# Patient Record
Sex: Male | Born: 1945 | Race: White | Hispanic: No | Marital: Married | Smoking: Never smoker
Health system: Southern US, Community
[De-identification: ages and names within clinical notes are randomized; demographics above are authoritative.]

## PROBLEM LIST (undated history)

## (undated) DIAGNOSIS — K429 Umbilical hernia without obstruction or gangrene: Secondary | ICD-10-CM

## (undated) DIAGNOSIS — Z951 Presence of aortocoronary bypass graft: Secondary | ICD-10-CM

## (undated) DIAGNOSIS — K08109 Complete loss of teeth, unspecified cause, unspecified class: Secondary | ICD-10-CM

## (undated) DIAGNOSIS — E669 Obesity, unspecified: Secondary | ICD-10-CM

## (undated) DIAGNOSIS — G4730 Sleep apnea, unspecified: Secondary | ICD-10-CM

## (undated) DIAGNOSIS — H9190 Unspecified hearing loss, unspecified ear: Secondary | ICD-10-CM

## (undated) DIAGNOSIS — Q210 Ventricular septal defect: Secondary | ICD-10-CM

## (undated) DIAGNOSIS — J449 Chronic obstructive pulmonary disease, unspecified: Secondary | ICD-10-CM

## (undated) DIAGNOSIS — I059 Rheumatic mitral valve disease, unspecified: Secondary | ICD-10-CM

## (undated) DIAGNOSIS — K222 Esophageal obstruction: Secondary | ICD-10-CM

## (undated) DIAGNOSIS — K219 Gastro-esophageal reflux disease without esophagitis: Secondary | ICD-10-CM

## (undated) DIAGNOSIS — I10 Essential (primary) hypertension: Secondary | ICD-10-CM

## (undated) DIAGNOSIS — I340 Nonrheumatic mitral (valve) insufficiency: Secondary | ICD-10-CM

## (undated) DIAGNOSIS — R2681 Unsteadiness on feet: Secondary | ICD-10-CM

## (undated) DIAGNOSIS — R4189 Other symptoms and signs involving cognitive functions and awareness: Secondary | ICD-10-CM

## (undated) DIAGNOSIS — Z8774 Personal history of (corrected) congenital malformations of heart and circulatory system: Secondary | ICD-10-CM

## (undated) DIAGNOSIS — I350 Nonrheumatic aortic (valve) stenosis: Secondary | ICD-10-CM

## (undated) DIAGNOSIS — E119 Type 2 diabetes mellitus without complications: Secondary | ICD-10-CM

## (undated) DIAGNOSIS — Q288 Other specified congenital malformations of circulatory system: Secondary | ICD-10-CM

## (undated) DIAGNOSIS — I471 Supraventricular tachycardia, unspecified: Secondary | ICD-10-CM

## (undated) DIAGNOSIS — I712 Thoracic aortic aneurysm, without rupture, unspecified: Secondary | ICD-10-CM

## (undated) DIAGNOSIS — I272 Pulmonary hypertension, unspecified: Secondary | ICD-10-CM

## (undated) DIAGNOSIS — I422 Other hypertrophic cardiomyopathy: Secondary | ICD-10-CM

## (undated) DIAGNOSIS — I351 Nonrheumatic aortic (valve) insufficiency: Secondary | ICD-10-CM

## (undated) DIAGNOSIS — R413 Other amnesia: Secondary | ICD-10-CM

## (undated) DIAGNOSIS — I2510 Atherosclerotic heart disease of native coronary artery without angina pectoris: Secondary | ICD-10-CM

## (undated) DIAGNOSIS — I509 Heart failure, unspecified: Secondary | ICD-10-CM

## (undated) DIAGNOSIS — K635 Polyp of colon: Secondary | ICD-10-CM

## (undated) DIAGNOSIS — N200 Calculus of kidney: Secondary | ICD-10-CM

## (undated) DIAGNOSIS — E785 Hyperlipidemia, unspecified: Secondary | ICD-10-CM

## (undated) DIAGNOSIS — Z8679 Personal history of other diseases of the circulatory system: Secondary | ICD-10-CM

## (undated) DIAGNOSIS — I4719 Other supraventricular tachycardia: Secondary | ICD-10-CM

## (undated) DIAGNOSIS — I480 Paroxysmal atrial fibrillation: Secondary | ICD-10-CM

## (undated) DIAGNOSIS — I4891 Unspecified atrial fibrillation: Secondary | ICD-10-CM

## (undated) DIAGNOSIS — R4689 Other symptoms and signs involving appearance and behavior: Secondary | ICD-10-CM

## (undated) HISTORY — DX: Supraventricular tachycardia: I47.1

## (undated) HISTORY — DX: Other supraventricular tachycardia: I47.19

## (undated) HISTORY — DX: Calculus of kidney: N20.0

## (undated) HISTORY — DX: Chronic obstructive pulmonary disease, unspecified: J44.9

## (undated) HISTORY — DX: Supraventricular tachycardia, unspecified: I47.10

## (undated) HISTORY — DX: Thoracic aortic aneurysm, without rupture, unspecified: I71.20

## (undated) HISTORY — DX: Ventricular septal defect: Q21.0

## (undated) HISTORY — PX: COLONOSCOPY W/ POLYPECTOMY: SHX1380

## (undated) HISTORY — PX: CARDIAC CATHETERIZATION: SHX172

## (undated) HISTORY — PX: OTHER SURGICAL HISTORY: SHX169

## (undated) HISTORY — DX: Thoracic aortic aneurysm, without rupture: I71.2

## (undated) HISTORY — PX: VSD REPAIR: SHX276

---

## 1989-02-03 HISTORY — PX: EYE SURGERY: SHX253

## 2000-10-28 ENCOUNTER — Emergency Department (HOSPITAL_COMMUNITY): Admission: EM | Admit: 2000-10-28 | Discharge: 2000-10-28 | Payer: Self-pay | Admitting: Emergency Medicine

## 2002-06-05 HISTORY — PX: NASAL SEPTUM SURGERY: SHX37

## 2003-04-10 ENCOUNTER — Other Ambulatory Visit: Payer: Self-pay

## 2003-06-02 ENCOUNTER — Other Ambulatory Visit: Payer: Self-pay

## 2004-12-21 ENCOUNTER — Other Ambulatory Visit: Payer: Self-pay

## 2004-12-21 ENCOUNTER — Emergency Department: Payer: Self-pay | Admitting: Emergency Medicine

## 2005-09-27 ENCOUNTER — Encounter: Payer: Self-pay | Admitting: Podiatry

## 2005-11-10 ENCOUNTER — Ambulatory Visit: Payer: Self-pay | Admitting: Unknown Physician Specialty

## 2009-03-05 ENCOUNTER — Ambulatory Visit: Payer: Self-pay | Admitting: Unknown Physician Specialty

## 2011-04-07 ENCOUNTER — Ambulatory Visit: Payer: Self-pay | Admitting: Internal Medicine

## 2011-05-10 ENCOUNTER — Other Ambulatory Visit: Payer: Self-pay | Admitting: Internal Medicine

## 2011-05-11 LAB — PSA: PSA: 0.7 ng/mL (ref 0.0–4.0)

## 2011-10-28 ENCOUNTER — Inpatient Hospital Stay: Payer: Self-pay | Admitting: Internal Medicine

## 2011-10-28 LAB — CBC
HCT: 43.3 % (ref 40.0–52.0)
HGB: 14.7 g/dL (ref 13.0–18.0)
MCH: 31.7 pg (ref 26.0–34.0)
MCHC: 33.9 g/dL (ref 32.0–36.0)
MCV: 93 fL (ref 80–100)
Platelet: 211 10*3/uL (ref 150–440)
RBC: 4.64 10*6/uL (ref 4.40–5.90)
RDW: 12.7 % (ref 11.5–14.5)
WBC: 10.9 10*3/uL — ABNORMAL HIGH (ref 3.8–10.6)

## 2011-10-28 LAB — COMPREHENSIVE METABOLIC PANEL
BUN: 18 mg/dL (ref 7–18)
Bilirubin,Total: 0.4 mg/dL (ref 0.2–1.0)
Chloride: 102 mmol/L (ref 98–107)
Co2: 26 mmol/L (ref 21–32)
EGFR (Non-African Amer.): >60
Glucose: 247 mg/dL — ABNORMAL HIGH (ref 65–99)
Osmolality: 284 (ref 275–301)
Potassium: 4.5 mmol/L (ref 3.5–5.1)
Sodium: 137 mmol/L (ref 136–145)
Total Protein: 7.1 g/dL (ref 6.4–8.2)

## 2011-10-28 LAB — CK TOTAL AND CKMB (NOT AT ARMC): CK, Total: 74 U/L (ref 35–232)

## 2011-10-28 LAB — TROPONIN I: Troponin-I: 0.09 ng/mL — ABNORMAL HIGH

## 2011-10-29 LAB — LIPID PANEL
Cholesterol: 192 mg/dL (ref 0–200)
HDL Cholesterol: 32 mg/dL — ABNORMAL LOW (ref 40–60)
Ldl Cholesterol, Calc: 130 mg/dL — ABNORMAL HIGH (ref 0–100)
Triglycerides: 150 mg/dL (ref 0–200)
VLDL Cholesterol, Calc: 30 mg/dL (ref 5–40)

## 2011-10-29 LAB — DRUG SCREEN, URINE
Amphetamines, Ur Screen: NEGATIVE (ref ?–1000)
Barbiturates, Ur Screen: NEGATIVE (ref ?–200)
Benzodiazepine, Ur Scrn: NEGATIVE (ref ?–200)
Cannabinoid 50 Ng, Ur ~~LOC~~: NEGATIVE (ref ?–50)
Methadone, Ur Screen: NEGATIVE (ref ?–300)
Opiate, Ur Screen: NEGATIVE (ref ?–300)
Phencyclidine (PCP) Ur S: NEGATIVE (ref ?–25)

## 2011-10-29 LAB — URINALYSIS, COMPLETE
Bacteria: NONE SEEN
Bilirubin,UR: NEGATIVE
Blood: NEGATIVE
Hyaline Cast: 10
Leukocyte Esterase: NEGATIVE
Nitrite: NEGATIVE
Ph: 5 (ref 4.5–8.0)
Protein: NEGATIVE
RBC,UR: 1 /HPF (ref 0–5)
Specific Gravity: 1.023 (ref 1.003–1.030)
Squamous Epithelial: 1
WBC UR: 1 /HPF (ref 0–5)

## 2011-10-29 LAB — CK TOTAL AND CKMB (NOT AT ARMC)
CK, Total: 60 U/L (ref 35–232)
CK-MB: 2.4 ng/mL (ref 0.5–3.6)
CK-MB: 1.9 ng/mL (ref 0.5–3.6)

## 2011-10-29 LAB — BASIC METABOLIC PANEL
BUN: 15 mg/dL (ref 7–18)
Chloride: 105 mmol/L (ref 98–107)
Co2: 27 mmol/L (ref 21–32)
EGFR (Non-African Amer.): >60
Glucose: 203 mg/dL — ABNORMAL HIGH (ref 65–99)
Osmolality: 288 (ref 275–301)

## 2011-10-29 LAB — CBC WITH DIFFERENTIAL/PLATELET
Basophil %: 0.5 %
Eosinophil #: 0.1 10*3/uL (ref 0.0–0.7)
HCT: 40.1 % (ref 40.0–52.0)
Lymphocyte #: 2.6 10*3/uL (ref 1.0–3.6)
Lymphocyte %: 30.6 %
MCH: 31.7 pg (ref 26.0–34.0)
MCHC: 34 g/dL (ref 32.0–36.0)
MCV: 93 fL (ref 80–100)
Monocyte #: 0.7 x10 3/mm (ref 0.2–1.0)
Monocyte %: 8.6 %
Neutrophil #: 5 10*3/uL (ref 1.4–6.5)
RBC: 4.31 10*6/uL — ABNORMAL LOW (ref 4.40–5.90)
WBC: 8.5 10*3/uL (ref 3.8–10.6)

## 2011-10-29 LAB — MAGNESIUM: Magnesium: 1.6 mg/dL — ABNORMAL LOW

## 2011-10-29 LAB — PROTIME-INR: Prothrombin Time: 13.1 secs (ref 11.5–14.7)

## 2011-10-29 LAB — TROPONIN I: Troponin-I: 0.22 ng/mL — ABNORMAL HIGH

## 2011-10-29 LAB — TSH: Thyroid Stimulating Horm: 1.56 u[IU]/mL

## 2011-10-30 LAB — BASIC METABOLIC PANEL
Anion Gap: 8 (ref 7–16)
BUN: 13 mg/dL (ref 7–18)
Calcium, Total: 8.1 mg/dL — ABNORMAL LOW (ref 8.5–10.1)
Chloride: 108 mmol/L — ABNORMAL HIGH (ref 98–107)
Co2: 26 mmol/L (ref 21–32)
Creatinine: 0.75 mg/dL (ref 0.60–1.30)
EGFR (African American): >60
Osmolality: 285 (ref 275–301)
Potassium: 4 mmol/L (ref 3.5–5.1)

## 2011-10-30 LAB — CBC WITH DIFFERENTIAL/PLATELET
Basophil #: 0 10*3/uL (ref 0.0–0.1)
HGB: 14.2 g/dL (ref 13.0–18.0)
Lymphocyte #: 2.2 10*3/uL (ref 1.0–3.6)
Lymphocyte %: 26.7 %
MCHC: 33.7 g/dL (ref 32.0–36.0)
MCV: 94 fL (ref 80–100)
Neutrophil #: 5.2 10*3/uL (ref 1.4–6.5)
Neutrophil %: 63 %
Platelet: 181 10*3/uL (ref 150–440)
WBC: 8.2 10*3/uL (ref 3.8–10.6)

## 2012-11-03 HISTORY — PX: CARDIAC CATHETERIZATION: SHX172

## 2012-11-07 ENCOUNTER — Ambulatory Visit: Payer: Self-pay | Admitting: Internal Medicine

## 2012-11-17 ENCOUNTER — Emergency Department: Payer: Self-pay | Admitting: Internal Medicine

## 2012-11-17 ENCOUNTER — Inpatient Hospital Stay (HOSPITAL_COMMUNITY)
Admission: AD | Admit: 2012-11-17 | Discharge: 2012-11-25 | DRG: 287 | Disposition: A | Discharge status: Home / Self Care | Payer: Managed Care, Other (non HMO) | Source: Other Acute Inpatient Hospital | Attending: Cardiology | Admitting: Cardiology

## 2012-11-17 DIAGNOSIS — I272 Pulmonary hypertension, unspecified: Secondary | ICD-10-CM | POA: Diagnosis present

## 2012-11-17 DIAGNOSIS — E119 Type 2 diabetes mellitus without complications: Secondary | ICD-10-CM | POA: Diagnosis present

## 2012-11-17 DIAGNOSIS — R600 Localized edema: Secondary | ICD-10-CM | POA: Diagnosis present

## 2012-11-17 DIAGNOSIS — I2510 Atherosclerotic heart disease of native coronary artery without angina pectoris: Secondary | ICD-10-CM | POA: Diagnosis present

## 2012-11-17 DIAGNOSIS — I2789 Other specified pulmonary heart diseases: Secondary | ICD-10-CM

## 2012-11-17 DIAGNOSIS — K029 Dental caries, unspecified: Secondary | ICD-10-CM | POA: Diagnosis present

## 2012-11-17 DIAGNOSIS — Z7901 Long term (current) use of anticoagulants: Secondary | ICD-10-CM

## 2012-11-17 DIAGNOSIS — G4730 Sleep apnea, unspecified: Secondary | ICD-10-CM | POA: Diagnosis present

## 2012-11-17 DIAGNOSIS — Q210 Ventricular septal defect: Secondary | ICD-10-CM

## 2012-11-17 DIAGNOSIS — K0530 Chronic periodontitis, unspecified: Secondary | ICD-10-CM | POA: Diagnosis present

## 2012-11-17 DIAGNOSIS — E876 Hypokalemia: Secondary | ICD-10-CM | POA: Diagnosis not present

## 2012-11-17 DIAGNOSIS — E7800 Pure hypercholesterolemia, unspecified: Secondary | ICD-10-CM | POA: Diagnosis present

## 2012-11-17 DIAGNOSIS — R6889 Other general symptoms and signs: Secondary | ICD-10-CM | POA: Diagnosis present

## 2012-11-17 DIAGNOSIS — R011 Cardiac murmur, unspecified: Secondary | ICD-10-CM

## 2012-11-17 DIAGNOSIS — I10 Essential (primary) hypertension: Secondary | ICD-10-CM

## 2012-11-17 DIAGNOSIS — K083 Retained dental root: Secondary | ICD-10-CM | POA: Diagnosis present

## 2012-11-17 DIAGNOSIS — K219 Gastro-esophageal reflux disease without esophagitis: Secondary | ICD-10-CM | POA: Diagnosis present

## 2012-11-17 DIAGNOSIS — I5031 Acute diastolic (congestive) heart failure: Secondary | ICD-10-CM | POA: Diagnosis present

## 2012-11-17 DIAGNOSIS — E785 Hyperlipidemia, unspecified: Secondary | ICD-10-CM | POA: Diagnosis present

## 2012-11-17 DIAGNOSIS — I4891 Unspecified atrial fibrillation: Principal | ICD-10-CM | POA: Diagnosis present

## 2012-11-17 DIAGNOSIS — R609 Edema, unspecified: Secondary | ICD-10-CM

## 2012-11-17 DIAGNOSIS — I428 Other cardiomyopathies: Secondary | ICD-10-CM | POA: Diagnosis present

## 2012-11-17 HISTORY — DX: Gastro-esophageal reflux disease without esophagitis: K21.9

## 2012-11-17 HISTORY — DX: Hyperlipidemia, unspecified: E78.5

## 2012-11-17 HISTORY — DX: Sleep apnea, unspecified: G47.30

## 2012-11-17 HISTORY — DX: Essential (primary) hypertension: I10

## 2012-11-17 LAB — MAGNESIUM: Magnesium: 1.6 mg/dL — ABNORMAL LOW

## 2012-11-17 LAB — TROPONIN I
Troponin I: <0.3 ng/mL (ref <0.30)
Troponin-I: 0.06 ng/mL — ABNORMAL HIGH

## 2012-11-17 LAB — CBC WITH DIFFERENTIAL/PLATELET
Basophils Absolute: 0 10*3/uL (ref 0.0–0.1)
Eosinophils Relative: 2 % (ref 0–5)
Lymphocytes Relative: 30 % (ref 12–46)
Neutro Abs: 4.4 10*3/uL (ref 1.7–7.7)
Neutrophils Relative %: 59 % (ref 43–77)
Platelets: 199 10*3/uL (ref 150–400)
RBC: 4.34 MIL/uL (ref 4.22–5.81)
RDW: 12.3 % (ref 11.5–15.5)
WBC: 7.5 10*3/uL (ref 4.0–10.5)

## 2012-11-17 LAB — CBC
HCT: 43.3 % (ref 40.0–52.0)
MCH: 31.8 pg (ref 26.0–34.0)
MCHC: 33.7 g/dL (ref 32.0–36.0)
Platelet: 218 10*3/uL (ref 150–440)
RBC: 4.58 10*6/uL (ref 4.40–5.90)
WBC: 10.6 10*3/uL (ref 3.8–10.6)

## 2012-11-17 LAB — COMPREHENSIVE METABOLIC PANEL
Albumin: 3.3 g/dL — ABNORMAL LOW (ref 3.4–5.0)
Bilirubin,Total: 0.6 mg/dL (ref 0.2–1.0)
Calcium, Total: 8.7 mg/dL (ref 8.5–10.1)
Chloride: 107 mmol/L (ref 98–107)
Co2: 23 mmol/L (ref 21–32)
Creatinine: 1.19 mg/dL (ref 0.60–1.30)
Glucose: 330 mg/dL — ABNORMAL HIGH (ref 65–99)
Potassium: 4.4 mmol/L (ref 3.5–5.1)
SGOT(AST): 55 U/L — ABNORMAL HIGH (ref 15–37)
Sodium: 137 mmol/L (ref 136–145)
Total Protein: 6.5 g/dL (ref 6.4–8.2)

## 2012-11-17 LAB — PRO B NATRIURETIC PEPTIDE: B-Type Natriuretic Peptide: 3366 pg/mL — ABNORMAL HIGH (ref 0–125)

## 2012-11-17 LAB — PROTIME-INR
INR: 1.07 (ref 0.00–1.49)
Prothrombin Time: 13.8 seconds (ref 11.6–15.2)

## 2012-11-17 MED ORDER — SODIUM CHLORIDE 0.9 % IJ SOLN
3.0000 mL | INTRAMUSCULAR | Status: DC | PRN
  Administered 2012-11-21: 3 mL via INTRAVENOUS

## 2012-11-17 MED ORDER — PATIENT'S GUIDE TO USING COUMADIN BOOK
Freq: Once | Status: DC
  Filled 2012-11-17: qty 1

## 2012-11-17 MED ORDER — WARFARIN - PHARMACIST DOSING INPATIENT: Freq: Every day | Status: DC

## 2012-11-17 MED ORDER — SODIUM CHLORIDE 0.9 % IV SOLN
250.0000 mL | INTRAVENOUS | Status: DC | PRN
  Administered 2012-11-18: 250 mL via INTRAVENOUS

## 2012-11-17 MED ORDER — ASPIRIN EC 81 MG PO TBEC
81.0000 mg | DELAYED_RELEASE_TABLET | Freq: Every day | ORAL | Status: DC
  Administered 2012-11-18 (×2): 81 mg via ORAL
  Filled 2012-11-17 (×2): qty 1

## 2012-11-17 MED ORDER — MORPHINE SULFATE 2 MG/ML IJ SOLN: 2.0000 mg | INTRAMUSCULAR | Status: DC | PRN

## 2012-11-17 MED ORDER — ONDANSETRON HCL 4 MG/2ML IJ SOLN: 4.0000 mg | Freq: Four times a day (QID) | INTRAMUSCULAR | Status: DC | PRN

## 2012-11-17 MED ORDER — WARFARIN SODIUM 7.5 MG PO TABS
7.5000 mg | ORAL_TABLET | ORAL | Status: AC
  Administered 2012-11-18: 7.5 mg via ORAL
  Filled 2012-11-17: qty 1

## 2012-11-17 MED ORDER — ONDANSETRON HCL 4 MG PO TABS: 4.0000 mg | ORAL_TABLET | Freq: Four times a day (QID) | ORAL | Status: DC | PRN

## 2012-11-17 MED ORDER — SODIUM CHLORIDE 0.9 % IJ SOLN
3.0000 mL | Freq: Two times a day (BID) | INTRAMUSCULAR | Status: DC
  Administered 2012-11-17 – 2012-11-25 (×13): 3 mL via INTRAVENOUS

## 2012-11-17 MED ORDER — ENOXAPARIN SODIUM 40 MG/0.4ML ~~LOC~~ SOLN
40.0000 mg | SUBCUTANEOUS | Status: DC
  Administered 2012-11-18: 40 mg via SUBCUTANEOUS
  Filled 2012-11-17 (×2): qty 0.4

## 2012-11-17 MED ORDER — DILTIAZEM HCL 100 MG IV SOLR
5.0000 mg/h | INTRAVENOUS | Status: DC
  Administered 2012-11-18 (×2): 5 mg/h via INTRAVENOUS
  Filled 2012-11-17 (×3): qty 100

## 2012-11-17 MED ORDER — INSULIN ASPART 100 UNIT/ML ~~LOC~~ SOLN
0.0000 [IU] | Freq: Three times a day (TID) | SUBCUTANEOUS | Status: DC
  Administered 2012-11-18: 2 [IU] via SUBCUTANEOUS
  Administered 2012-11-18: 3 [IU] via SUBCUTANEOUS
  Administered 2012-11-18 – 2012-11-19 (×2): 2 [IU] via SUBCUTANEOUS
  Administered 2012-11-19: 3 [IU] via SUBCUTANEOUS
  Administered 2012-11-19: 5 [IU] via SUBCUTANEOUS
  Administered 2012-11-20: 2 [IU] via SUBCUTANEOUS
  Administered 2012-11-20: 3 [IU] via SUBCUTANEOUS
  Administered 2012-11-22: 1 [IU] via SUBCUTANEOUS
  Administered 2012-11-22: 2 [IU] via SUBCUTANEOUS
  Administered 2012-11-22 – 2012-11-23 (×2): 1 [IU] via SUBCUTANEOUS
  Administered 2012-11-23 – 2012-11-24 (×3): 2 [IU] via SUBCUTANEOUS
  Administered 2012-11-24: 3 [IU] via SUBCUTANEOUS
  Administered 2012-11-25 (×2): 1 [IU] via SUBCUTANEOUS

## 2012-11-17 MED ORDER — WARFARIN VIDEO
Freq: Once | Status: AC
  Administered 2012-11-18: 1

## 2012-11-17 NOTE — Progress Notes (Addendum)
Pt arrived via [facility], pt oriented. Pt able to ambulate to bathroom on arrival, had to void immediately and could not wait for urinal. Assist to bed. Afib with HR 98-114, cardizem drip at 5mg /hr.  Paged Dr Mahala Menghini with hospitalist team, to be receiving pt as admitting MD.

## 2012-11-17 NOTE — Progress Notes (Addendum)
ANTICOAGULATION CONSULT NOTE - Initial Consult  Pharmacy Consult for coumadin Indication: atrial fibrillation  Allergies  Allergen Reactions  . Biaxin (Clarithromycin) Nausea Only    Patient Measurements: Weight: 228 lb 2.8 oz (103.5 kg)  Vital Signs: Temp: 99 F (37.2 C) (06/15 2016) Temp src: Oral (06/15 2016) BP: 142/89 mmHg (06/15 2016) Pulse Rate: 94 (06/15 2016)  Labs: No results found for this basename: HGB, HCT, PLT, APTT, LABPROT, INR, HEPARINUNFRC, CREATININE, CKTOTAL, CKMB, TROPONINI,  in the last 72 hours  CrCl is unknown because no creatinine reading has been taken and the patient has no height on file.   Medical History: No past medical history on file.  Medications:  No prescriptions prior to admission   Scheduled:  . aspirin EC  81 mg Oral Daily  . enoxaparin (LOVENOX) injection  40 mg Subcutaneous Q24H  . [START ON 11/18/2012] insulin aspart  0-9 Units Subcutaneous TID WC  . sodium chloride  3 mL Intravenous Q12H    Assessment: 67 yo male here with afib to start coumadin. No baseline INR available. Patient noted on lovenox 40mg  [place] q24hr.  Goal of Therapy:  INR 2-3 Monitor platelets by anticoagulation protocol: Yes   Plan:  -Baseline PT/INR now and daily -Begin education process -May be able to consider an alternative agent (apixiban, xarelto, etc)?  Harland German, Pharm D 11/17/2012 9:58 PM   Addendum: Baseline INR 1.07  Plan: 1) Coumadin 7.5 mg po today  Christoper Fabian, PharmD, BCPS Clinical pharmacist, pager [phone] 11/17/2012  11:32 PM

## 2012-11-17 NOTE — H&P (Signed)
History and Physical  Aaron Jones [id number]:[id number] DOB: January 13, 1946 DOA: 11/17/2012  Referring physician: ER at [place] [facility] PCP: Elmo Putt, MD   Chief Complaint: Chest pain and afib with RVR  HPI: Patient is a 67 year old man with past medical history most significant for diabetes, hypertension, hypercholesterolemia who has been having chest pain and shortness of breath for last 1 week. Patient describes chest pain as feeling of tightness present in the middle of the chest described as 6/10 it is worse. Patient's chest pain was exacerbated by minimal exertion and was associated with palpitations. There are no relieving factors. No radiation noted. Patient wants to get himself checked out and went to his primary care physician where it was noted that patient had atrial fibrillation with his heart rate running into 170s. A chest x-ray was also done which confirmed that patient had pulmonary edema(unconfirmed). Patient was started on Cardizem drip and was sent to [facility] for further management.  Patient denied any chest pain to me at this time. He was lying in his bed comfortably and was able to answer all the questions appropriately. Patient's wife was also at bedside who was assisting in answering many questions.  Patient can walk many blocks without getting short of breath. He takes all his medications and tells me that he has a heart murmur but does not know much more than that.  Patient also has a cardiologist in [place].   Patient also complains of bilateral lower extremity swelling. He denied any orthopnea, PND, previous history of chest pain, shortness of breath, heart attack, change in urinary habits, change in dietary habits, recent weight loss, fever, chills, cough.  15 point review of system was negative except what is noted above in the history of present illness.    Past medical history: Diabetes Hypertension Hypercholesterolemia  No past surgical history on  file.  Social History: Patient does not smoke and does not drink any alcohol and denies any drug use  Allergies  Allergen Reactions  . Biaxin (Clarithromycin) Nausea Only    Family history Noncontributory at this time  Prior to Admission medications   Not on File  Aspirin Cardizem  Rest of the medications needs to be confirmed from the pharmacy   Physical Exam: Filed Vitals:   11/17/12 1854 11/17/12 1900 11/17/12 2016  BP: 153/85 127/94 142/89  Pulse:   94  Temp:   99 F (37.2 C)  TempSrc:   Oral  Resp: 23 21 21   Weight: 228 lb 2.8 oz (103.5 kg)    SpO2: 97% 97% 97%   Physical Exam: General: Vital signs reviewed and noted. Well-developed, well-nourished, in no acute distress; alert, appropriate and cooperative throughout examination.  Head: Normocephalic, atraumatic.  Eyes: PERRL, EOMI, No signs of anemia or jaundince.  Nose: Mucous membranes moist, not inflammed, nonerythematous.  Throat: Oropharynx nonerythematous, no exudate appreciated.   Neck: No deformities, masses, or tenderness noted.Supple, No carotid Bruits, no JVD.  Lungs:  Normal respiratory effort. Patient had bibasilar crackles up to mid lung   Heart:  irregularly irregular rhythm. S1 and S2 normal without gallop, murmur, or rubs.  Abdomen:  BS normoactive. Soft, Nondistended, non-tender.  No masses or organomegaly.  Extremities:  trace pedal edema bilaterally up to mid shin   Neurologic: A&O X3, CN II - XII are grossly intact. Motor strength is 5/5 in the all 4 extremities, Sensations intact to light touch, Cerebellar signs negative.  Skin: No visible rashes, scars.     Wt  Readings from Last 3 Encounters:  11/17/12 228 lb 2.8 oz (103.5 kg)    Labs on Admission:  No labs have been cathetered over from ER at [facility]   CBG:  Recent Labs Lab 11/17/12 2022  GLUCAP 227*     Radiological Exams on Admission: No results found.  EKG: No EKG on chart at the time of history taking and  review   Principal Problem:   Atrial fibrillation with rapid ventricular response Active Problems:   Type II or unspecified type diabetes mellitus without mention of complication, not stated as uncontrolled   Essential hypertension, benign   Other and unspecified hyperlipidemia   Heart murmur   Assessment/Plan Patient is a 67 year old with past medical history as noted above was admitted with one week of progressive chest pain and shortness of breath and found to have new onset atrial fibrillation with rapid ventricular response.  A. fib with RVR: Cause unknown. CHADVASC score of 3+ which means that patient should be anticoagulated with Coumadin.  -Start Coumadin per pharmacy -Diltiazem drip for rate control -Change drip to oral diltiazem tomorrow -2-D echocardiogram to look for structural abnormalities or valvular heart disease -Start aspirin -TSH to rule out hyperthyroidism -Consider consulting cardiology in the morning for possible cardioversion with TEE versus 3 weeks of anticoagulation and then cardioversion -Patient should be transferred to telemetry unit in the morning if he remains chest pain-free -Cardiac enzymes x3 to rule out acute ischemia as a cause of atrial fibrillation -12-lead EKG now and in the morning -2 view chest x-ray -Check basic labs  Type 2 diabetes Sliding scale insulin Check HbA1c   Code Status: Full code Family Communication: Wife present at bedside and updated Disposition Plan/Anticipated LOS: 1-2 days  Time spent: 75 minutes  Lars Mage, MD  [facility] Team 5  If 7PM-7AM, please contact night-coverage at [email or web address], password [email or web address] 11/17/2012, 10:05 PM

## 2012-11-18 ENCOUNTER — Encounter (HOSPITAL_COMMUNITY): Payer: Self-pay | Admitting: *Deleted

## 2012-11-18 ENCOUNTER — Inpatient Hospital Stay (HOSPITAL_COMMUNITY): Payer: Managed Care, Other (non HMO)

## 2012-11-18 DIAGNOSIS — Q210 Ventricular septal defect: Secondary | ICD-10-CM

## 2012-11-18 DIAGNOSIS — I272 Pulmonary hypertension, unspecified: Secondary | ICD-10-CM | POA: Diagnosis present

## 2012-11-18 DIAGNOSIS — E876 Hypokalemia: Secondary | ICD-10-CM | POA: Diagnosis not present

## 2012-11-18 DIAGNOSIS — I359 Nonrheumatic aortic valve disorder, unspecified: Secondary | ICD-10-CM

## 2012-11-18 DIAGNOSIS — R600 Localized edema: Secondary | ICD-10-CM | POA: Diagnosis present

## 2012-11-18 DIAGNOSIS — R6889 Other general symptoms and signs: Secondary | ICD-10-CM | POA: Diagnosis present

## 2012-11-18 DIAGNOSIS — R011 Cardiac murmur, unspecified: Secondary | ICD-10-CM

## 2012-11-18 DIAGNOSIS — I4891 Unspecified atrial fibrillation: Principal | ICD-10-CM

## 2012-11-18 LAB — COMPREHENSIVE METABOLIC PANEL
Alkaline Phosphatase: 64 U/L (ref 39–117)
BUN: 19 mg/dL (ref 6–23)
Chloride: 102 mEq/L (ref 96–112)
Creatinine, Ser: 0.96 mg/dL (ref 0.50–1.35)
GFR calc Af Amer: >90 mL/min (ref >90)
GFR calc non Af Amer: 85 mL/min — ABNORMAL LOW (ref >90)
Glucose, Bld: 194 mg/dL — ABNORMAL HIGH (ref 70–99)
Potassium: 3.5 mEq/L (ref 3.5–5.1)
Total Bilirubin: 0.5 mg/dL (ref 0.3–1.2)

## 2012-11-18 LAB — GLUCOSE, CAPILLARY
Glucose-Capillary: 83 mg/dL (ref 70–99)
Glucose-Capillary: 242 mg/dL — ABNORMAL HIGH (ref 70–99)

## 2012-11-18 LAB — MRSA PCR SCREENING: MRSA by PCR: NEGATIVE

## 2012-11-18 LAB — HEMOGLOBIN A1C: Hgb A1c MFr Bld: 10.6 % — ABNORMAL HIGH (ref <5.7)

## 2012-11-18 LAB — TROPONIN I: Troponin I: <0.3 ng/mL (ref <0.30)

## 2012-11-18 LAB — TSH: TSH: 1.571 u[IU]/mL (ref 0.350–4.500)

## 2012-11-18 MED ORDER — ENOXAPARIN SODIUM 100 MG/ML ~~LOC~~ SOLN
100.0000 mg | Freq: Two times a day (BID) | SUBCUTANEOUS | Status: DC
  Administered 2012-11-18: 100 mg via SUBCUTANEOUS
  Filled 2012-11-18 (×2): qty 1

## 2012-11-18 MED ORDER — APIXABAN 5 MG PO TABS
5.0000 mg | ORAL_TABLET | Freq: Two times a day (BID) | ORAL | Status: DC
  Administered 2012-11-18: 5 mg via ORAL
  Filled 2012-11-18 (×3): qty 1

## 2012-11-18 MED ORDER — FUROSEMIDE 10 MG/ML IJ SOLN
20.0000 mg | Freq: Once | INTRAMUSCULAR | Status: AC
  Administered 2012-11-18: 20 mg via INTRAVENOUS

## 2012-11-18 MED ORDER — INSULIN GLARGINE 100 UNIT/ML ~~LOC~~ SOLN
18.0000 [IU] | Freq: Every day | SUBCUTANEOUS | Status: DC
  Administered 2012-11-18 – 2012-11-24 (×7): 18 [IU] via SUBCUTANEOUS
  Filled 2012-11-18 (×10): qty 0.18

## 2012-11-18 MED ORDER — FUROSEMIDE 10 MG/ML IJ SOLN
INTRAMUSCULAR | Status: AC
  Filled 2012-11-18: qty 4

## 2012-11-18 MED ORDER — POTASSIUM CHLORIDE CRYS ER 20 MEQ PO TBCR
40.0000 meq | EXTENDED_RELEASE_TABLET | Freq: Once | ORAL | Status: AC
  Administered 2012-11-18: 40 meq via ORAL
  Filled 2012-11-18: qty 2

## 2012-11-18 NOTE — Progress Notes (Signed)
[facility] Progress Note [place] TEAM 1 - Stepdown/ICU TEAM   Aaron Jones [id number]:[id number] DOB: 16-Jul-1945 DOA: 11/17/2012 PCP: Elmo Putt, MD  Brief narrative: 67 year old male patient with history of diabetes hypertension. Presented with chest pain x1 week tightness in the middle of the chest associated with palpitations. Previous history of rapid heartbeat followed by cardiologist in [place]. When he initially sought medical care at [facility] his heart rate was in the 170s. Chest x-ray was consistent with possible pulmonary edema. The patient was started on a Cardizem drip and sent to [facility]. Patient told the admitting physician he had not missed any of his usual medications.  Assessment/Plan:  Atrial fibrillation with rapid ventricular response -Cards consulted -h/o SVT followed by Dr Lady Gary in [place] -CHADS =3 -needs anti-coagulation- Cards has changed to Apixaban -Cont CCB for rate control-of note pt told Crds that had been missing meds at home -if RVR persists and pt sx will need TEE guided ECV (per Cards)  Hypokalemia -follow and replete prn  Mild difuse Hypokinesis -probably solely due to persistent tachycardia -agree with Myoview this admit  Essential hypertension, benign -moderately controlled -was on CCB, BB and ACE I pre admit  Type II diabetes mellitus -currently controlled with SSI -on Actos and Glucophage at home  Edema of left lower extremity -possibly due to DVT vs CM induced volume overload -d dimer mild elevation - will check venous duplex - already being anticoagulated   ? Ventricular septal defect (VSD), membranous /  Heart murmur -murmur since birth -consider TEE to evaluate for VSD  Mild Pulmonary HTN -(see above)  Dyslipidemia    DVT prophylaxis: Lovenox and Coumadin >> Apixaban  Code Status: Full Family Communication: Patient and wife at bedside Disposition Plan: Remain in step down Isolation:  None  Consultants: Cardiology  Procedures: 2-D echocardiogram - Left ventricle: The cavity size was mildly dilated. Wall thickness was increased in a pattern of mild LVH. The estimated ejection fraction was 50%. Mild diffuse hypokinesis. Indeterminant diastolic function (atrial fibrillation). - Ventricular septum: Suspect there is a small perimembranous VSD. - Aortic valve: There was no stenosis. Mild regurgitation. - Mitral valve: Trivial regurgitation. - Left atrium: The atrium was mildly dilated. - Right ventricle: The cavity size was mildly dilated. Systolic function was normal. - Right atrium: The atrium was moderately dilated. - Tricuspid valve: Peak RV-RA gradient: 29mm Hg (S). - Pulmonary arteries: PA systolic pressure 40-44 mmHg. - Systemic veins: IVC measured 2.4 cm with < 50% respirophasic variation, suggesting RA pressure 11-15 mmHg. Impressions: - The patient appeared to be in atrial fibrillation. The LV was mildly dilated with mild diffuse hypokinesis, EF 50%. There appeared to be a perimembranous VSD. The RV was mildly dilated with normla systolic function. There was mild pulmonary hypertension. Would consider TEE for closer evaluation of VSD.  Lower extremity venous duplex pending  Antibiotics: None  HPI/Subjective: Patient awake and has no awareness of tachypalpitations when they occur in the past - no awareness of tachypalpitations during this admission. No reports of chest pain or shortness of breath since admission.   Objective: Blood pressure 149/90, pulse 109, temperature 98 F (36.7 C), temperature source Oral, resp. rate 21, height 6' (1.829 m), weight 104 kg (229 lb 4.5 oz), SpO2 96.00%.  Intake/Output Summary (Last 24 hours) at 11/18/12 1450 Last data filed at 11/18/12 1347  Gross per 24 hour  Intake    886 ml  Output    500 ml  Net  386 ml    Exam: General: No acute respiratory distress Lungs: Clear to auscultation bilaterally  without wheezes or crackles, RA Cardiovascular: Irregular rate and atrial fibrillation rhythm; very loud grade 4/5 pansystolic murmur; no gallop or rub, bilateral peripheral edema greater on left about JVD Abdomen: Nontender, nondistended, soft, bowel sounds positive, no rebound, no ascites, no appreciable mass Musculoskeletal: No significant cyanosis, clubbing of bilateral lower extremities Neurological: Alert and oriented x 3, moves all extremities x 4 without focal neurological deficits, CN 2-12 intact  Scheduled Meds: Scheduled Meds: . apixaban  5 mg Oral BID  . furosemide  20 mg Intravenous Once  . insulin aspart  0-9 Units Subcutaneous TID WC  . potassium chloride  40 mEq Oral Once  . sodium chloride  3 mL Intravenous Q12H   Continuous Infusions: . diltiazem (CARDIZEM) infusion 5 mg/hr (11/18/12 0004)    Data Reviewed: Basic Metabolic Panel:  Recent Labs Lab 11/17/12 2225  NA 139  K 3.5  CL 102  CO2 27  GLUCOSE 194*  BUN 19  CREATININE 0.96  CALCIUM 8.4   Liver Function Tests:  Recent Labs Lab 11/17/12 2225  AST 36  ALT 65*  ALKPHOS 64  BILITOT 0.5  PROT 5.9*  ALBUMIN 2.9*   CBC:  Recent Labs Lab 11/17/12 2225  WBC 7.5  NEUTROABS 4.4  HGB 13.8  HCT 40.3  MCV 92.9  PLT 199   Cardiac Enzymes:  Recent Labs Lab 11/17/12 2225 11/18/12 0520  TROPONINI <0.30 <0.30   CBG:  Recent Labs Lab 11/17/12 2022 11/18/12 0815 11/18/12 1258  GLUCAP 227* 187* 176*    Recent Results (from the past 240 hour(s))  MRSA PCR SCREENING     Status: None   Collection Time    11/18/12 12:54 AM      Result Value Range Status   MRSA by PCR NEGATIVE  NEGATIVE Final   Comment:            The GeneXpert MRSA Assay (FDA     approved for NASAL specimens     only), is one component of a     comprehensive MRSA colonization     surveillance program. It is not     intended to diagnose MRSA     infection nor to guide or     monitor treatment for     MRSA  infections.     Studies:  Recent x-ray studies have been reviewed in detail by the Attending Physician  Scheduled Meds:  Reviewed in detail by the Attending Physician   Junious Silk, ANP [facility] Office  [phone] Pager [phone]  **If unable to reach the above provider after paging please contact the Flow Manager @ [phone]  On-Call/Text Page:      [email or web address]      password [email or web address]  If 7PM-7AM, please contact night-coverage [email or web address] Password [email or web address] 11/18/2012, 2:50 PM   LOS: 1 day   I have personally examined this patient and reviewed the entire database. I have reviewed the above note, made any necessary editorial changes, and agree with its content.  Lonia Blood, MD [facility]

## 2012-11-18 NOTE — Consult Note (Signed)
CARDIOLOGY CONSULT NOTE   Patient ID: Aaron Jones MRN: [id number] DOB/AGE: March 28, 1946 67 y.o.  Admit date: 11/17/2012  Primary Physician   Elmo Putt, MD Primary Cardiologist   New, sees Dr Lady Gary in [place] Reason for Consultation   Atrial fibrillation, RVR  Aaron Jones is a 67 y.o. male with no history of CAD History of diabetes, hypertension, hypercholesterolemia. He had some tachycardia in 2013, possible SVT and was treated at [facility], put on Cardizem. Follows with cardiologist, Dr. Lady Gary in [place].  Pt was in his usual state of health until about 2-3 weeks ago when he noticed he would become sob with climbing stairs and with doing yard work. He did not try any medication and noticed that symptoms improved with rest.  For the past week and a half he has noticed increased ankle swelling and onset of PND. About a week ago he developed a tightening sensation around his chest and abdomen along with some muscle cramping  In adductors with increased activity. Yesterday the tightening sensation in his chest and abdomen progressed to a heavy pressure that he rated a 6/10. He went to urgent care who found him to be in afib and was sent to [facility]. Patient reports that his pain completely resolved as soon as he received cardizem.  Pt states that he has not been taking his medications regularly because often times he just forgets before going to work.  He has lost 10 pounds in the past 3 months unintentionally. Does report early satiety. He denies orthopnea, fever, chills and cough.  First and second cardiac enzymes are negative.  Chest xrays shows cardiomegaly, central venous congestion and small effusions. D dimer positive. Was admitted to hospital and we were consulted for management of his a fib with rapid ventricular response. Past Medical History  Diagnosis Date  . Hypertension   . Shortness of breath   . Sleep apnea   . Diabetes mellitus without  complication   . Heart murmur   . GERD (gastroesophageal reflux disease)   . Hyperlipidemia   . Tachycardia, paroxysmal May 2013    Possible SVT    Past Surgical History  Procedure Laterality Date  . Nasal septum surgery  2004  . Cardiac catheterization  > 5 yr ago    Done at [facility], reportedly clean    Allergies  Allergen Reactions  . Biaxin (Clarithromycin) Nausea Only   I have reviewed the patient's current medications . aspirin EC  81 mg Oral Daily  . enoxaparin (LOVENOX) injection  100 mg Subcutaneous Q12H  . insulin aspart  0-9 Units Subcutaneous TID WC  . sodium chloride  3 mL Intravenous Q12H   . diltiazem (CARDIZEM) infusion 5 mg/hr (11/18/12 0004)   sodium chloride, morphine injection, ondansetron (ZOFRAN) IV, ondansetron, sodium chloride  Medication Sig  acetaminophen (TYLENOL) 500 MG tablet Take 500 mg by mouth every 6 (six) hours as needed for pain.  aspirin EC 81 MG tablet Take 81 mg by mouth daily.  atorvastatin (LIPITOR) 20 MG tablet Take 20 mg by mouth at bedtime.  diltiazem (CARDIZEM LA) 120 MG 24 hr tablet Take 120 mg by mouth daily.  lisinopril (PRINIVIL,ZESTRIL) 20 MG tablet Take 20 mg by mouth daily.  metFORMIN (GLUCOPHAGE-XR) 500 MG 24 hr tablet Take 1,000 mg by mouth 2 (two) times daily.  metoprolol succinate (TOPROL-XL) 100 MG 24 hr tablet Take 150 mg by mouth daily. Take with or immediately following a meal.  omeprazole (PRILOSEC) 40 MG capsule Take  40 mg by mouth daily.  pioglitazone (ACTOS) 30 MG tablet Take 30 mg by mouth daily.     History   Social History  . Marital Status: Married    Spouse Name: N/A    Number of Children: N/A  . 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 Smoking status: Never Smoker   . Smokeless tobacco: Never Used  . Alcohol Use: No  . Drug Use: No  . Sexually Active: Not on file   Other Topics Concern  . Not on file   Social  History Narrative   Still works full time. Works around the yard. Lives with wife. 2 sisters, neither with cardiac issues.    Family Status  Relation Status Death Age  . Mother Deceased 45    PNA, DM  . Father Deceased 44    Infection after hip surgery   History reviewed. No pertinent family history.   ROS:  Full 14 point review of systems complete and found to be negative unless listed above.  Physical Exam: Blood pressure 142/80, pulse 97, temperature 97.8 F (36.6 C), temperature source Oral, resp. rate 18, height 6' (1.829 m), weight 229 lb 4.5 oz (104 kg), SpO2 96.00%.  General: Well developed, well nourished, male in no acute distress Head: Eyes PERRLA, No xanthomas.   Normocephalic and atraumatic, oropharynx without edema or exudate. Dentition:  Lungs: decreased breath sounds at bases, Heart: Heart irregular rate and rhythm with S1, S2  3/6 holosystolic murmur heard  throughout precordium, pulses are 2+ extrem.   Neck: carotid bruit heard on left. No lymphadenopathy.   Elevated JVD. Abdomen: Bowel sounds present, abdomen soft and non-tender without masses or hernias noted. Umbilical hernia Msk:  No spine or cva tenderness. No weakness, no joint deformities or effusions. Extremities: No clubbing or cyanosis. 2+ edema.  Neuro: Alert and oriented X 3. No focal deficits noted. Psych:  Good affect, responds appropriately Skin: No rashes or lesions noted.  Labs:   Lab Results  Component Value Date   WBC 7.5 11/17/2012   HGB 13.8 11/17/2012   HCT 40.3 11/17/2012   MCV 92.9 11/17/2012   PLT 199 11/17/2012    Recent Labs  11/18/12 0520  INR 1.10     Recent Labs Lab 11/17/12 2225  NA 139  K 3.5  CL 102  CO2 27  BUN 19  CREATININE 0.96  CALCIUM 8.4  PROT 5.9*  BILITOT 0.5  ALKPHOS 64  ALT 65*  AST 36  GLUCOSE 194*    Recent Labs  11/17/12 2225 11/18/12 0520  TROPONINI <0.30 <0.30   Lab Results  Component Value Date   DDIMER 0.86* 11/18/2012   TSH    Date/Time Value Range Status  11/17/2012 10:25 PM 1.571  0.350 - 4.500 uIU/mL Final   Lab Results  Component Value Date   HGBA1C 10.6* 11/17/2012    ECG: 18-Nov-2012 07:26:06   Atrial fibrillation with rapid ventricular response Right bundle branch block Left anterior fascicular block   Bifascicular block   Cannot rule out Anterior infarct , age undetermined Vent. rate 110 BPM PR interval * ms QRS duration 160 ms QT/QTc 380/514 ms P-R-T axes * -78 86    Radiology:  X-ray Chest Pa And Lateral  11/18/2012   *RADIOLOGY REPORT*  Clinical Data: Short of breath  CHEST - 2 VIEW  Comparison: None.  Findings: Heart silhouette  is enlarged.  There is central venous pulmonary congestion.  Small bilateral pleural effusions.  No focal infiltrate.  No overt pulmonary edema. No acute osseous abnormality.  IMPRESSION: Cardiomegaly, central venous congestion, and small effusions.   Original Report Authenticated By: Genevive Bi, M.D.    ASSESSMENT AND PLAN:   The patient was seen today by Dr Jens Som, the patient evaluated and the data reviewed.  Principal Problem:   Atrial fibrillation with rapid ventricular response Active Problems:   Essential hypertension, benign   Type II or unspecified type diabetes mellitus without mention of complication, not stated as uncontrolled   Other and unspecified hyperlipidemia   Heart murmur  1. Atrial Fibrillation with rapid ventricular response. Check results of ECHO.  CHA2DS2-VASc score is 3 which recommends anticoagulation with Coumadin. Due to undermined timeframe of atrial fibrillation, it is recommended that patient be anticoagulated for 3 weeks prior to cardioversion.  2. Hypertension  3. Hyperlipidemia: history of, check lipids and adjust medication dosage if appropriate  4.DM Aic 10.6  Signed: Darliss Cheney, PA-S Aaron Demark, PA-C 11/18/2012 12:23 PM Beeper [phone]  As above, patient seen and examined. Briefly he is a 67 year old male  with a past medical history of murmur, diabetes, hypertension and hyperlipidemia who am asked to evaluate for atrial fibrillation. The patient is followed in [place] by Dr. Lady Gary with question history of SVT. Patient typically does not have significant dyspnea, palpitations or exertional chest pain. Over the past 2 weeks he has noticed worsening dyspnea on exertion, orthopnea and pedal edema. He has not had palpitations. He has noticed abdominal and lower chest tightness with exertion. He was found to be in atrial fibrillation with a rapid ventricular response and admitted. Cardiology is asked to evaluate. Physical exam shows irregular heart rhythm and tachycardic rate. He has a loud 3-4/6 systolic murmur heard best at lower left sternal border. Extremities show 1+ edema. Cardiac enzymes are negative. Electrocardiogram showed atrial fibrillation with a rapid ventricular response, right bundle branch block and left anterior fascicular block. TSH is normal. Patient with new diagnosis of atrial fibrillation. He has embolic risk factors of age 77, diabetes mellitus and hypertension. Discontinue Lovenox and Coumadin. Treat with apixaban 5 mg po BID. Continue Cardizem for rate control and convert to by mouth. If he is symptomatically well controlled after addition of Cardizem then we will plan to proceed with outpatient cardioversion 4 weeks after he has been anticoagulated. If he remains symptomatic despite rate control then we will proceed with TEE guided cardioversion. He is mildly volume overloaded. I will gently diurese and follow renal function. Check echocardiogram for LV function. He also has a very loud murmur with possible VSD by history in the past. His enzymes are negative but he does have multiple risk factors. Plan Myoview for risk stratification. Patient will fu with Dr Lady Gary following DC. Olga Millers

## 2012-11-18 NOTE — Care Management Note (Addendum)
  Page 2 of 2   11/25/2012     11:31:28 AM   CARE MANAGEMENT NOTE 11/25/2012  Patient:  Nester,Chritopher   Account Number:  [id number]  Date Initiated:  11/18/2012  Documentation initiated by:  Alvira Philips Assessment:   67 yr-old male adm dx of AFib w/RVR; lives with spouse, independent PTA     Action/Plan:   CM to complete benefits check for Eliquis 5mg .   Anticipated DC Date:  11/28/2012   Anticipated DC Plan:  HOME W HOME HEALTH SERVICES      DC Planning Services  CM consult      Choice offered to / List presented to:             Status of service:  Completed, signed off Medicare Important Message given?   (If response is "NO", the following Medicare IM given date fields will be blank) Date Medicare IM given:   Date Additional Medicare IM given:    Discharge Disposition:  HOME/SELF CARE  Per UR Regulation:  Reviewed for med. necessity/level of care/duration of stay  If discussed at Long Length of Stay Meetings, dates discussed:    Comments:  PCP: Dr Yates Decamp with [facility]  6/23...1125.Marland KitchenMarland KitchenOletta Cohn, RN, BSN, [place] [phone] Benefits check for Eliquis= $50.00 co-pay per 30day with no prior auth.  Pt may use any retail pharmacy.  CM gave pt 30day free card and $10 prescription refill cards.    11-21-12 11:30am Avie Arenas, RNBSN [phone] Cardiac cath done on 11-20-12 - ?? need for heart surgery -  11/18/12 1434 Henrietta Mayo RN MSN BSN CCM Pt to d/c on Eliquis 5 mg BID.  TC to [employer]. Talked with liaison, Cordelia Pen, who determined that pt will qualify for $10 copay for 30 day supply.  TC to [facility], pt's pharmacy - they have med in stock.  Provided card to pt and he will activate same.

## 2012-11-18 NOTE — Progress Notes (Signed)
Patient and his wife watch the Coumadin video and claimed that  it is very informative.

## 2012-11-18 NOTE — Progress Notes (Signed)
Nutrition Brief Note  Patient identified on the Malnutrition Screening Tool (MST) Report for recent weight loss without trying and eating poorly because of a decreased appetite.  Per H&P patient denied and changes in dietary habits and recent weight loss.  Body mass index is 31.09 kg/(m^2). Patient meets criteria for Obesity Class I based on current BMI.   Current diet order is Carbohydrate Modified Low Calorie, patient is consuming approximately 100% of meals at this time. Labs and medications reviewed.   No nutrition interventions warranted at this time. If nutrition issues arise, please consult RD.   Maureen Chatters, RD, LDN Pager #: [phone] After-Hours Pager #: [phone]

## 2012-11-18 NOTE — Progress Notes (Signed)
Bladder scan results 98ml.

## 2012-11-18 NOTE — Progress Notes (Signed)
Echocardiogram 2D Echocardiogram has been performed.  Jatavis Malek 11/18/2012, 10:15 AM

## 2012-11-18 NOTE — Progress Notes (Signed)
ANTICOAGULATION CONSULT NOTE - Initial Consult  Pharmacy Consult for lovenox Indication: atrial fibrillation  Allergies  Allergen Reactions  . Biaxin (Clarithromycin) Nausea Only    Patient Measurements: Height: 6' (182.9 cm) Weight: 229 lb 4.5 oz (104 kg) IBW/kg (Calculated) : 77.6  Vital Signs: Temp: 97.8 F (36.6 C) (06/16 0819) Temp src: Oral (06/16 0819) BP: 142/80 mmHg (06/16 1100) Pulse Rate: 97 (06/16 0819)  Labs:  Recent Labs  11/17/12 2225 11/18/12 0520  HGB 13.8  --   HCT 40.3  --   PLT 199  --   LABPROT 13.8 14.1  INR 1.07 1.10  CREATININE 0.96  --   TROPONINI <0.30 <0.30    Estimated Creatinine Clearance: 95.7 ml/min (by C-G formula based on Cr of 0.96).   Medical History: Past Medical History  Diagnosis Date  . Hypertension   . Shortness of breath   . Sleep apnea   . Diabetes mellitus without complication   . Heart murmur   . GERD (gastroesophageal reflux disease)   . Hyperlipidemia    Assessment: 67 yom initially started on coumadin for afib now changing to lovenox for now. Pts CBC was WNL as of last night and pt has excellent renal function.   Goal of Therapy:  Anti-Xa level 0.6-1.2 units/ml 4hrs after LMWH dose given Monitor platelets by anticoagulation protocol: Yes   Plan:  1. Lovenox 100mg  SQ Q12H 2. CBC Q72H  3. F/u oral anticoag plans  Rica Heather, Drake Leach 11/18/2012,11:35 AM

## 2012-11-19 DIAGNOSIS — M79609 Pain in unspecified limb: Secondary | ICD-10-CM

## 2012-11-19 DIAGNOSIS — R609 Edema, unspecified: Secondary | ICD-10-CM

## 2012-11-19 DIAGNOSIS — I2789 Other specified pulmonary heart diseases: Secondary | ICD-10-CM

## 2012-11-19 DIAGNOSIS — I10 Essential (primary) hypertension: Secondary | ICD-10-CM

## 2012-11-19 LAB — BASIC METABOLIC PANEL
Calcium: 8.4 mg/dL (ref 8.4–10.5)
Creatinine, Ser: 0.89 mg/dL (ref 0.50–1.35)
GFR calc non Af Amer: 88 mL/min — ABNORMAL LOW (ref >90)
Glucose, Bld: 216 mg/dL — ABNORMAL HIGH (ref 70–99)
Sodium: 139 mEq/L (ref 135–145)

## 2012-11-19 LAB — CBC
Hemoglobin: 13.4 g/dL (ref 13.0–17.0)
MCH: 31 pg (ref 26.0–34.0)
MCHC: 33.2 g/dL (ref 30.0–36.0)
MCV: 93.5 fL (ref 78.0–100.0)

## 2012-11-19 LAB — MAGNESIUM: Magnesium: 1.9 mg/dL (ref 1.5–2.5)

## 2012-11-19 LAB — GLUCOSE, CAPILLARY
Glucose-Capillary: 294 mg/dL — ABNORMAL HIGH (ref 70–99)
Glucose-Capillary: 184 mg/dL — ABNORMAL HIGH (ref 70–99)
Glucose-Capillary: 223 mg/dL — ABNORMAL HIGH (ref 70–99)

## 2012-11-19 MED ORDER — ASPIRIN 81 MG PO CHEW
81.0000 mg | CHEWABLE_TABLET | Freq: Every day | ORAL | Status: DC
  Administered 2012-11-19 – 2012-11-25 (×6): 81 mg via ORAL
  Filled 2012-11-19 (×5): qty 1
  Filled 2012-11-19: qty 4
  Filled 2012-11-19: qty 1

## 2012-11-19 MED ORDER — FUROSEMIDE 20 MG PO TABS
20.0000 mg | ORAL_TABLET | Freq: Every day | ORAL | Status: DC
  Administered 2012-11-19 – 2012-11-23 (×4): 20 mg via ORAL
  Filled 2012-11-19 (×6): qty 1

## 2012-11-19 MED ORDER — DILTIAZEM HCL 60 MG PO TABS
60.0000 mg | ORAL_TABLET | Freq: Three times a day (TID) | ORAL | Status: DC
  Administered 2012-11-19 – 2012-11-21 (×7): 60 mg via ORAL
  Filled 2012-11-19 (×15): qty 1

## 2012-11-19 MED ORDER — ASPIRIN 81 MG PO CHEW
324.0000 mg | CHEWABLE_TABLET | ORAL | Status: AC
  Administered 2012-11-20: 324 mg via ORAL

## 2012-11-19 MED ORDER — SODIUM CHLORIDE 0.9 % IV SOLN
INTRAVENOUS | Status: DC
  Administered 2012-11-20: 05:00:00 via INTRAVENOUS

## 2012-11-19 MED ORDER — METOPROLOL TARTRATE 50 MG PO TABS
75.0000 mg | ORAL_TABLET | Freq: Two times a day (BID) | ORAL | Status: DC
  Administered 2012-11-19 – 2012-11-25 (×12): 75 mg via ORAL
  Filled 2012-11-19 (×15): qty 1

## 2012-11-19 MED ORDER — SODIUM CHLORIDE 0.9 % IJ SOLN
3.0000 mL | Freq: Two times a day (BID) | INTRAMUSCULAR | Status: DC
  Administered 2012-11-19: 3 mL via INTRAVENOUS

## 2012-11-19 MED ORDER — DIAZEPAM 2 MG PO TABS
2.0000 mg | ORAL_TABLET | ORAL | Status: AC
  Administered 2012-11-20: 2 mg via ORAL
  Filled 2012-11-19: qty 1

## 2012-11-19 MED ORDER — SODIUM CHLORIDE 0.9 % IV SOLN: 250.0000 mL | INTRAVENOUS | Status: DC | PRN

## 2012-11-19 MED ORDER — SODIUM CHLORIDE 0.9 % IJ SOLN: 3.0000 mL | INTRAMUSCULAR | Status: DC | PRN

## 2012-11-19 NOTE — Plan of Care (Signed)
Problem: Phase III Progression Outcomes Goal: Sinus rhythm established or heart rate < 100 at rest Outcome: Not Progressing Remains in a fib    Goal: Anticoagulation Therapy per MD order Outcome: Not Applicable Date Met:  11/19/12 Anticoagulation on hold per MD orders  Pre cath

## 2012-11-19 NOTE — Progress Notes (Signed)
   Subjective:  Denies CP; mild dyspnea   Objective:  Filed Vitals:   11/18/12 1700 11/18/12 2000 11/18/12 2357 11/19/12 0355  BP: 146/95 143/87 122/82 135/84  Pulse: 130 103 115 95  Temp: 97.8 F (36.6 C) 98.5 F (36.9 C) 97.7 F (36.5 C) 97.9 F (36.6 C)  TempSrc: Oral Oral Oral Oral  Resp: 22 21 19 15   Height:      Weight:    229 lb 4.5 oz (104 kg)  SpO2: 96% 97% 97% 97%    Intake/Output from previous day:  Intake/Output Summary (Last 24 hours) at 11/19/12 0631 Last data filed at 11/19/12 0500  Gross per 24 hour  Intake   1468 ml  Output   2575 ml  Net  -1107 ml    Physical Exam: Physical exam: Well-developed well-nourished in no acute distress.  Skin is warm and dry.  HEENT is normal.  Neck is supple.  Chest is clear to auscultation with normal expansion.  Cardiovascular exam is irregular, 3/6 systolic murmur LSB Abdominal exam nontender or distended. No masses palpated. Extremities show trace edema. neuro grossly intact    Lab Results: Basic Metabolic Panel:  Recent Labs  16/10/96 2225  NA 139  K 3.5  CL 102  CO2 27  GLUCOSE 194*  BUN 19  CREATININE 0.96  CALCIUM 8.4   CBC:  Recent Labs  11/17/12 2225 11/19/12 0555  WBC 7.5 7.0  NEUTROABS 4.4  --   HGB 13.8 13.4  HCT 40.3 40.4  MCV 92.9 93.5  PLT 199 209   Cardiac Enzymes:  Recent Labs  11/17/12 2225 11/18/12 0520  TROPONINI <0.30 <0.30     Assessment/Plan:  1 atrial fibrillation-the patient remains in atrial fibrillation. Continue Cardizem for rate control but transition to by mouth (60 mg po every 8 hours). Patient will need long-term anticoagulation as he has multiple embolic risk factors. Note TSH normal. Ejection fraction 50%. 2 ventricular septal defect-not well visualized on transthoracic echocardiogram. However right side is enlarged. Question if this is contributing to atrial fibrillation. I would favor right and left heart catheterization to exclude coronary disease,  check pulmonary pressures and measure QP/QS. Question if this may need to be closed. I will hold apixiban and plan to proceed with catheterization tomorrow. The risks and benefits were discussed and the patient agrees to proceed. 3 chest pain-symptoms somewhat atypical. Cancel nuclear study today and instead proceed with catheterization tomorrow as outlined in #2. 4 diabetes mellitus-management per primary care. 5 mild cardiomyopathy-possibly related to atrial fibrillation with a rapid ventricular response. Continue Cardizem for rate control. Gentle diuresis (mildly volume overloaded). Cardiac catheterization to exclude coronary disease.  Olga Millers 11/19/2012, 6:31 AM

## 2012-11-19 NOTE — Progress Notes (Signed)
Inpatient Diabetes Program Recommendations  AACE/ADA: New Consensus Statement on Inpatient Glycemic Control (2013)  Target Ranges:  Prepandial:   less than 140 mg/dL      Peak postprandial:   less than 180 mg/dL (1-2 hours)      Critically ill patients:  140 - 180 mg/dL   Reason for Visit: Elevated HgbA1C - 10.6%  Results for [name], Aaron Jones (MRN [id number]) as of 11/19/2012 17:23  Ref. Range 11/18/2012 18:05 11/18/2012 21:14 11/19/2012 08:25 11/19/2012 11:30 11/19/2012 17:01  Glucose-Capillary Latest Range: 70-99 mg/dL 409 (H) 811 (H) 914 (H) 294 (H) 184 (H)  Results for [name], [name] (MRN [id number]) as of 11/19/2012 17:23  Ref. Range 11/17/2012 22:25  Hemoglobin A1C Latest Range: <5.7 % 10.6 (H)    Inpatient Diabetes Program Recommendations Insulin - Basal: Increase Lantus to 24 units QHS Correction (SSI): Increase Novolog to resistant tidwc and HS,  please add HS coverage Insulin - Meal Coverage:  Will likely benefit from addition of meal coverage insulin - Novolog 4 units tidwc if pt eats >50% meal Outpatient Referral: Will order OP Diabetes Education for uncontrolled DM - HgbA1C of 10.6%  Note: If pt to go home on insulin, RN to begin teaching insulin administration.  Thank you. Ailene Ards, RD, LDN, CDE Inpatient Diabetes Coordinator [phone]

## 2012-11-19 NOTE — Progress Notes (Signed)
*  PRELIMINARY RESULTS* Vascular Ultrasound Lower extremity venous duplex has been completed.  Preliminary findings: negative for DVT.  Farrel Demark, RDMS, RVT  11/19/2012, 11:05 AM

## 2012-11-19 NOTE — Progress Notes (Signed)
[facility] Progress Note [place] TEAM 1 - Stepdown/ICU TEAM   Aaron Jones ZOX:[id number] DOB: 06/22/1945 DOA: 11/17/2012 PCP: Elmo Putt, MD  Brief narrative: 67 year old male patient with history of diabetes hypertension. Presented with chest pain x1 week tightness in the middle of the chest associated with palpitations. Previous history of rapid heartbeat followed by cardiologist in [place]. When he initially sought medical care at [facility] his heart rate was in the 170s. Chest x-ray was consistent with possible pulmonary edema. The patient was started on a Cardizem drip and sent to [facility]. Patient told the admitting physician he had not missed any of his usual medications.  Assessment/Plan:  Atrial fibrillation with rapid ventricular response -Cards consulted -h/o SVT followed by Dr Lady Gary in [place] -CHADS =3 -needs anti-coagulation- Cards changed to Apixaban 6/16 but on hold for planned cath -Cont CCB for rate control-of note pt told Cards that had been missing meds at home -have converted to po CCB- since persistent tachycardiawill also add back home BB (6/17) but at lower dose since CCB higher than home dose  Hypokalemia -follow and replete prn  Mild diffuse Hypokinesis -probably combo persistent tachycardia and RH changes from ? VSD -Myoview changed to cath given need to evaluate for VSD  Essential hypertension, benign -moderately controlled -cont  CCB and BB ACE I pre admit on hold for now  Type II diabetes mellitus -currently controlled with SSI -on Actos and Glucophage at home  Edema of left lower extremity -possibly due to DVT vs CM induced volume overload-PRELIM on duplex is negative -d dimer mild elevation -already being anticoagulated   ? Ventricular septal defect (VSD), membranous /  Heart murmur -murmur since birth -for right and left heart cath 6/18 -consider closure based on cath and severity  Mild  Pulmonary HTN -could be secondary to VSD  Dyslipidemia    DVT prophylaxis: Lovenox and Coumadin >> Apixaban 6/16 Code Status: Full Family Communication: Patient and wife at bedside Disposition Plan: Remain in step down Isolation: None  Consultants: Cardiology  Procedures: 2-D echocardiogram - Left ventricle: The cavity size was mildly dilated. Wall thickness was increased in a pattern of mild LVH. The estimated ejection fraction was 50%. Mild diffuse hypokinesis. Indeterminant diastolic function (atrial fibrillation). - Ventricular septum: Suspect there is a small perimembranous VSD. - Aortic valve: There was no stenosis. Mild regurgitation. - Mitral valve: Trivial regurgitation. - Left atrium: The atrium was mildly dilated. - Right ventricle: The cavity size was mildly dilated. Systolic function was normal. - Right atrium: The atrium was moderately dilated. - Tricuspid valve: Peak RV-RA gradient: 29mm Hg (S). - Pulmonary arteries: PA systolic pressure 40-44 mmHg. - Systemic veins: IVC measured 2.4 cm with < 50% respirophasic variation, suggesting RA pressure 11-15 mmHg. Impressions: - The patient appeared to be in atrial fibrillation. The LV was mildly dilated with mild diffuse hypokinesis, EF 50%. There appeared to be a perimembranous VSD. The RV was mildly dilated with normla systolic function. There was mild pulmonary hypertension. Would consider TEE for closer evaluation of VSD.  Lower extremity venous duplex pending  Antibiotics: None  HPI/Subjective: Patient awake and still  No awareness of tachypalpitations when. No reports of chest pain. Mild orthopnea earlier this am. No shortness of breath when upright.   Objective: Blood pressure 107/48, pulse 77, temperature 97.6 F (36.4 C), temperature source Oral, resp. rate 18, height 6' (1.829 m), weight 104 kg (229 lb 4.5 oz), SpO2 97.00%.  Intake/Output Summary (Last 24  hours) at 11/19/12 1258 Last data  filed at 11/19/12 0900  Gross per 24 hour  Intake    955 ml  Output   2750 ml  Net  -1795 ml    Exam: General: No acute respiratory distress Lungs: Clear to auscultation bilaterally without wheezes or crackles, RA Cardiovascular: Irregular rate and atrial fibrillation rhythm; very loud grade 4/5 pansystolic murmur; no gallop or rub, bilateral peripheral edema greater on left about JVD Abdomen: Nontender, nondistended, soft, bowel sounds positive, no rebound, no ascites, no appreciable mass Musculoskeletal: No significant cyanosis, clubbing of bilateral lower extremities Neurological: Alert and oriented x 3, moves all extremities x 4 without focal neurological deficits, CN 2-12 intact  Scheduled Meds: Scheduled Meds: . aspirin  81 mg Oral Daily  . diltiazem  60 mg Oral Q8H  . furosemide  20 mg Oral Daily  . insulin aspart  0-9 Units Subcutaneous TID WC  . insulin glargine  18 Units Subcutaneous QHS  . metoprolol tartrate  75 mg Oral BID  . sodium chloride  3 mL Intravenous Q12H   Continuous Infusions:    Data Reviewed: Basic Metabolic Panel:  Recent Labs Lab 11/17/12 2225 11/19/12 0555  NA 139 139  K 3.5 4.3  CL 102 103  CO2 27 27  GLUCOSE 194* 216*  BUN 19 17  CREATININE 0.96 0.89  CALCIUM 8.4 8.4  MG  --  1.9   Liver Function Tests:  Recent Labs Lab 11/17/12 2225  AST 36  ALT 65*  ALKPHOS 64  BILITOT 0.5  PROT 5.9*  ALBUMIN 2.9*   CBC:  Recent Labs Lab 11/17/12 2225 11/19/12 0555  WBC 7.5 7.0  NEUTROABS 4.4  --   HGB 13.8 13.4  HCT 40.3 40.4  MCV 92.9 93.5  PLT 199 209   Cardiac Enzymes:  Recent Labs Lab 11/17/12 2225 11/18/12 0520  TROPONINI <0.30 <0.30   CBG:  Recent Labs Lab 11/18/12 1654 11/18/12 1805 11/18/12 2114 11/19/12 0825 11/19/12 1130  GLUCAP 242* 255* 190* 241* 294*    Recent Results (from the past 240 hour(s))  MRSA PCR SCREENING     Status: None   Collection Time    11/18/12 12:54 AM      Result Value Range  Status   MRSA by PCR NEGATIVE  NEGATIVE Final   Comment:            The GeneXpert MRSA Assay (FDA     approved for NASAL specimens     only), is one component of a     comprehensive MRSA colonization     surveillance program. It is not     intended to diagnose MRSA     infection nor to guide or     monitor treatment for     MRSA infections.     Studies:  Recent x-ray studies have been reviewed in detail by the Attending Physician  Scheduled Meds:  Reviewed in detail by the Attending Physician   Junious Silk, ANP [facility] Office  [phone] Pager [phone]  **If unable to reach the above provider after paging please contact the Flow Manager @ [phone]  On-Call/Text Page:      [email or web address]      password [email or web address]  If 7PM-7AM, please contact night-coverage [email or web address] Password [facility] 11/19/2012, 12:58 PM   LOS: 2 days    I have examined the patient, reviewed the chart and modified the above note which I agree with.   Jacara Benito,MD [phone] 11/19/2012, 4:12 PM

## 2012-11-20 ENCOUNTER — Encounter (HOSPITAL_COMMUNITY)
Admission: AD | Disposition: A | Discharge status: Home / Self Care | Payer: Self-pay | Source: Other Acute Inpatient Hospital | Attending: Cardiology

## 2012-11-20 DIAGNOSIS — I2510 Atherosclerotic heart disease of native coronary artery without angina pectoris: Secondary | ICD-10-CM

## 2012-11-20 DIAGNOSIS — Q210 Ventricular septal defect: Secondary | ICD-10-CM

## 2012-11-20 HISTORY — PX: LEFT AND RIGHT HEART CATHETERIZATION WITH CORONARY ANGIOGRAM: SHX5449

## 2012-11-20 LAB — BASIC METABOLIC PANEL
Calcium: 8.8 mg/dL (ref 8.4–10.5)
GFR calc Af Amer: >90 mL/min (ref >90)
GFR calc non Af Amer: 89 mL/min — ABNORMAL LOW (ref >90)
Glucose, Bld: 208 mg/dL — ABNORMAL HIGH (ref 70–99)
Potassium: 3.9 mEq/L (ref 3.5–5.1)
Sodium: 137 mEq/L (ref 135–145)

## 2012-11-20 LAB — POCT I-STAT 3, VENOUS BLOOD GAS (G3P V)
Acid-Base Excess: 1 mmol/L (ref 0.0–2.0)
Acid-base deficit: 1 mmol/L (ref 0.0–2.0)
Bicarbonate: 25.5 mEq/L — ABNORMAL HIGH (ref 20.0–24.0)
Bicarbonate: 25.9 mEq/L — ABNORMAL HIGH (ref 20.0–24.0)
Bicarbonate: 28.1 mEq/L — ABNORMAL HIGH (ref 20.0–24.0)
TCO2: 29 mmol/L (ref 0–100)
pCO2, Ven: 48.5 mmHg (ref 45.0–50.0)
pCO2, Ven: 49.5 mmHg (ref 45.0–50.0)
pCO2, Ven: 50.9 mmHg — ABNORMAL HIGH (ref 45.0–50.0)
pCO2, Ven: 53.1 mmHg — ABNORMAL HIGH (ref 45.0–50.0)
pCO2, Ven: 54 mmHg — ABNORMAL HIGH (ref 45.0–50.0)
pH, Ven: 7.309 — ABNORMAL HIGH (ref 7.250–7.300)
pH, Ven: 7.336 — ABNORMAL HIGH (ref 7.250–7.300)
pH, Ven: 7.314 — ABNORMAL HIGH (ref 7.250–7.300)
pO2, Ven: 37 mmHg (ref 30.0–45.0)
pO2, Ven: 45 mmHg (ref 30.0–45.0)
pO2, Ven: 49 mmHg — ABNORMAL HIGH (ref 30.0–45.0)
pO2, Ven: 35 mmHg (ref 30.0–45.0)

## 2012-11-20 LAB — POCT I-STAT 3, ART BLOOD GAS (G3+)
pCO2 arterial: 47 mmHg — ABNORMAL HIGH (ref 35.0–45.0)
pH, Arterial: 7.363 (ref 7.350–7.450)

## 2012-11-20 LAB — GLUCOSE, CAPILLARY
Glucose-Capillary: 183 mg/dL — ABNORMAL HIGH (ref 70–99)
Glucose-Capillary: 250 mg/dL — ABNORMAL HIGH (ref 70–99)
Glucose-Capillary: 174 mg/dL — ABNORMAL HIGH (ref 70–99)

## 2012-11-20 LAB — CBC
Hemoglobin: 13.7 g/dL (ref 13.0–17.0)
Platelets: 230 10*3/uL (ref 150–400)
RBC: 4.34 MIL/uL (ref 4.22–5.81)
WBC: 6.3 10*3/uL (ref 4.0–10.5)

## 2012-11-20 SURGERY — LEFT AND RIGHT HEART CATHETERIZATION WITH CORONARY ANGIOGRAM
Anesthesia: LOCAL

## 2012-11-20 MED ORDER — NITROGLYCERIN 0.2 MG/ML ON CALL CATH LAB
INTRAVENOUS | Status: AC
  Filled 2012-11-20: qty 1

## 2012-11-20 MED ORDER — MIDAZOLAM HCL 2 MG/2ML IJ SOLN
INTRAMUSCULAR | Status: AC
  Filled 2012-11-20: qty 2

## 2012-11-20 MED ORDER — SODIUM CHLORIDE 0.9 % IV SOLN
INTRAVENOUS | Status: DC
  Administered 2012-11-20: 22:00:00 via INTRAVENOUS

## 2012-11-20 MED ORDER — LIDOCAINE HCL (PF) 1 % IJ SOLN
INTRAMUSCULAR | Status: AC
  Filled 2012-11-20: qty 30

## 2012-11-20 MED ORDER — ONDANSETRON HCL 4 MG/2ML IJ SOLN: 4.0000 mg | Freq: Four times a day (QID) | INTRAMUSCULAR | Status: DC | PRN

## 2012-11-20 MED ORDER — HEPARIN (PORCINE) IN NACL 100-0.45 UNIT/ML-% IJ SOLN
1500.0000 [IU]/h | INTRAMUSCULAR | Status: DC
  Administered 2012-11-20 – 2012-11-23 (×5): 1500 [IU]/h via INTRAVENOUS
  Filled 2012-11-20 (×7): qty 250

## 2012-11-20 MED ORDER — HEPARIN (PORCINE) IN NACL 2-0.9 UNIT/ML-% IJ SOLN
INTRAMUSCULAR | Status: AC
Start: 2012-11-20 — End: 2012-11-20
  Filled 2012-11-20: qty 1000

## 2012-11-20 MED ORDER — FENTANYL CITRATE 0.05 MG/ML IJ SOLN
INTRAMUSCULAR | Status: AC
  Filled 2012-11-20: qty 2

## 2012-11-20 MED ORDER — SODIUM CHLORIDE 0.9 % IV SOLN: INTRAVENOUS | Status: AC

## 2012-11-20 MED ORDER — SODIUM CHLORIDE 0.9 % IV SOLN: INTRAVENOUS | Status: DC

## 2012-11-20 MED ORDER — ACETAMINOPHEN 325 MG PO TABS: 650.0000 mg | ORAL_TABLET | ORAL | Status: DC | PRN

## 2012-11-20 NOTE — Interval H&P Note (Signed)
History and Physical Interval Note:  11/20/2012 8:57 AM  Aaron Jones  has presented today for surgery, with the diagnosis of CP and VSDThe various methods of treatment have been discussed with the patient and family. After consideration of risks, benefits and other options for treatment, the patient has consented to  Procedure(s): LEFT AND RIGHT HEART CATHETERIZATION WITH CORONARY ANGIOGRAM (N/A) as a surgical intervention .  The patient's history has been reviewed, patient examined, no change in status, stable for surgery.  I have reviewed the patient's chart and labs.  Questions were answered to the patient's satisfaction.     Floyd Wade

## 2012-11-20 NOTE — CV Procedure (Addendum)
Cardiac Cath Procedure Note  Indication: CP (in setting of AF with RVR) and VSD  Procedures performed:  1) Right heart cathererization 2) Selective coronary angiography 3) Left heart catheterization 4) Left ventriculogram 5) Shunt run   Description of procedure:     The risks and indication of the procedure were explained. Consent was signed and placed on the chart. An appropriate timeout was taken prior to the procedure. The right groin was prepped and draped in the routine sterile fashion and anesthetized with 1% local lidocaine.   A 5 FR arterial sheath was placed in the right femoral artery using a modified Seldinger technique. Standard catheters including a JL4, JR4 and angled pigtail were used. All catheter exchanges were made over a wire. A 7 FR venous sheath was placed in the right femoral vein using a modified Seldinger technique. A standard Swan-Ganz catheter was used for the procedure.   Complications:  None apparent  Total contrast: 80 cc  Findings:  RA = 19 RV = 45/15/18 PA = 48/24 (34) PCW = 22 (v = 35) Fick cardiac output/index = 4.9/2.2 PVR = 1.2 [place] SVR = 1040 FA sat = 97% SVC sat = 62% RA sat = 64% RV sat = 78% PA sat = 81%     Qp/QS = 2.1  Ao Pressure: 116/69 (86) LV Pressure:  106/13/18 There was no signficant gradient across the aortic valve on pullback.  Left main: Normal  LAD: Mild plaque in proximal portion   Ramus: Very large branching vessel. Normal   LCX: Small vessel. normal  RCA: Dominant vessel. 20% mid. 80-90% hazy lesion in ostium of small to moderate -sized PDA (2.25 mm)  LV-gram done in the RAO projection: Ejection fraction = 45% global HK  Assessment: 1. 1v CAD as described above 2. Significantly elevated R-sided filling pressure 3. 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 EF 45%  Plan/Discussion:  Based on cath numbers VSD appears hemodynamically significant. Will arrange TEE.  He has  significant lesion in small-to-moderate sized PDA. CP occurred during rapid AF but uncommon with exertion. I reviewed with Dr. Clifton James lesion is approachable percutaneously but given lack of significant exertional CP and possible need for CT surgery for VSD will treat medically for now.   Aaron Meres, MD 9:42 AM

## 2012-11-20 NOTE — Progress Notes (Signed)
Patient post cath continues to be afib with frequent alarms of VT that is not always true VT questionable due to his BBB and other cardiac related issues. C.Berg he is aware. Patient is stable and asymptomatic. Call bell within reach. I will continue to monitor.

## 2012-11-20 NOTE — Progress Notes (Signed)
[facility] [place] TEAM 1 - Stepdown/ICU TEAM  Results of cath noted.  Discussed case with attending Cardiologist.  Given that all current active issues are being attended to by the Cardiology service Dr. Jens Som has graciously agreed to assume the attending role for this gentleman.  The patient's diabetes is currently well-controlled.  [facility] will sign off.  Please feel free to contact us if questions arise or further assistance is requested.  Lonia Blood, MD [facility] Office  [phone] Pager [phone]  On-Call/Text Page:      [email or web address]      password [facility]

## 2012-11-20 NOTE — Progress Notes (Signed)
ANTICOAGULATION CONSULT NOTE - Follow Up Consult  Pharmacy Consult for heparin Indication: afib and CP  Allergies  Allergen Reactions  . Biaxin (Clarithromycin) Nausea Only    Patient Measurements: Height: 6' (182.9 cm) Weight: 233 lb 11 oz (106 kg) IBW/kg (Calculated) : 77.6 Heparin Dosing Weight: 99.7 kg  Vital Signs: Temp: 97.4 F (36.3 C) (06/18 1037) Temp src: Oral (06/18 1037) BP: 138/90 mmHg (06/18 1037) Pulse Rate: 70 (06/18 1037)  Labs:  Recent Labs  11/17/12 2225 11/18/12 0520 11/19/12 0555 11/20/12 0515  HGB 13.8  --  13.4 13.7  HCT 40.3  --  40.4 40.9  PLT 199  --  209 230  LABPROT 13.8 14.1  --   --   INR 1.07 1.10  --   --   CREATININE 0.96  --  0.89 0.86  TROPONINI <0.30 <0.30  --   --     Estimated Creatinine Clearance: 107.8 ml/min (by C-G formula based on Cr of 0.86).   Medications:  Scheduled:  . aspirin  81 mg Oral Daily  . diltiazem  60 mg Oral Q8H  . furosemide  20 mg Oral Daily  . insulin aspart  0-9 Units Subcutaneous TID WC  . insulin glargine  18 Units Subcutaneous QHS  . metoprolol tartrate  75 mg Oral BID  . sodium chloride  3 mL Intravenous Q12H   Infusions:  . sodium chloride 50 mL/hr at 11/20/12 1014    Assessment: 67 yo male with afib and CP s/p cath will be started on heparin therapy 8hrs post sheath removal.  Pending CT surgery consult.  Hgb 13.7 and Plt 230 K.  Per RN, sheath was removed at 1030. Goal of Therapy:  Heparin level 0.3-0.7 units/ml Monitor platelets by anticoagulation protocol: Yes   Plan:  1) Start heparin at 1500 units/hr at 1830 tonight. No bolus. 2) 6hr heparin level after drip is started 3) Daily heparin level and CBC 4) F/u plan on CT surgery  Melaine Mcphee, Tsz-Yin 11/20/2012,11:28 AM

## 2012-11-20 NOTE — Progress Notes (Signed)
   Subjective:  Denies CP; mild dyspnea   Objective:  Filed Vitals:   11/20/12 0300 11/20/12 0400 11/20/12 0500 11/20/12 0724  BP: 122/81 121/67 120/82 144/88  Pulse: 82 69 80 76  Temp:  97.6 F (36.4 C)  97.7 F (36.5 C)  TempSrc:  Oral  Oral  Resp: 14 17 12 17   Height:      Weight:  233 lb 11 oz (106 kg)    SpO2: 100% 95% 98% 96%    Intake/Output from previous day:  Intake/Output Summary (Last 24 hours) at 11/20/12 0727 Last data filed at 11/20/12 0600  Gross per 24 hour  Intake  61.33 ml  Output   3000 ml  Net -2938.67 ml    Physical Exam: Physical exam: Well-developed well-nourished in no acute distress.  Skin is warm and dry.  HEENT is normal.  Neck is supple.  Chest is clear to auscultation with normal expansion.  Cardiovascular exam is irregular, 3/6 systolic murmur LSB Abdominal exam nontender or distended. No masses palpated. Extremities show trace edema. neuro grossly intact    Lab Results: Basic Metabolic Panel:  Recent Labs  16/10/96 2225 11/19/12 0555 11/20/12 0515  NA 139 139 137  K 3.5 4.3 3.9  CL 102 103 101  CO2 27 27 29   GLUCOSE 194* 216* 208*  BUN 19 17 18   CREATININE 0.96 0.89 0.86  CALCIUM 8.4 8.4 8.8  MG  --  1.9  --    CBC:  Recent Labs  11/17/12 2225 11/19/12 0555 11/20/12 0515  WBC 7.5 7.0 6.3  NEUTROABS 4.4  --   --   HGB 13.8 13.4 13.7  HCT 40.3 40.4 40.9  MCV 92.9 93.5 94.2  PLT 199 209 230   Cardiac Enzymes:  Recent Labs  11/17/12 2225 11/18/12 0520  TROPONINI <0.30 <0.30     Assessment/Plan:  1 atrial fibrillation-the patient remains in atrial fibrillation. Continue Cardizem and lopressor for rate control. Patient will need long-term anticoagulation as he has multiple embolic risk factors. Note TSH normal. Ejection fraction 50%. 2 ventricular septal defect-not well visualized on transthoracic echocardiogram. However right side is enlarged. Question if this is contributing to atrial fibrillation. I  would favor right and left heart catheterization to exclude coronary disease, check pulmonary pressures and measure QP/QS. Question if this may need to be closed. Apixiban on hold and plan to proceed with catheterization today. The risks and benefits were discussed and the patient agrees to proceed. Resume apixaban after. 3 chest pain-symptoms somewhat atypical. Proceed with catheterization as outlined in #2. 4 diabetes mellitus-management per primary care. 5 mild cardiomyopathy-possibly related to atrial fibrillation with a rapid ventricular response. Continue Cardizem and lopressor for rate control. Cardiac catheterization to exclude coronary disease.  Aaron Jones 11/20/2012, 7:27 AM

## 2012-11-20 NOTE — Progress Notes (Signed)
Utilization Review Completed Evonte Prestage J. Adrain Butrick, RN, BSN, NCM [phone]  

## 2012-11-20 NOTE — H&P (View-Only) (Signed)
   Subjective:  Denies CP; mild dyspnea   Objective:  Filed Vitals:   11/20/12 0300 11/20/12 0400 11/20/12 0500 11/20/12 0724  BP: 122/81 121/67 120/82 144/88  Pulse: 82 69 80 76  Temp:  97.6 F (36.4 C)  97.7 F (36.5 C)  TempSrc:  Oral  Oral  Resp: 14 17 12 17  Height:      Weight:  233 lb 11 oz (106 kg)    SpO2: 100% 95% 98% 96%    Intake/Output from previous day:  Intake/Output Summary (Last 24 hours) at 11/20/12 0727 Last data filed at 11/20/12 0600  Gross per 24 hour  Intake  61.33 ml  Output   3000 ml  Net -2938.67 ml    Physical Exam: Physical exam: Well-developed well-nourished in no acute distress.  Skin is warm and dry.  HEENT is normal.  Neck is supple.  Chest is clear to auscultation with normal expansion.  Cardiovascular exam is irregular, 3/6 systolic murmur LSB Abdominal exam nontender or distended. No masses palpated. Extremities show trace edema. neuro grossly intact    Lab Results: Basic Metabolic Panel:  Recent Labs  11/17/12 2225 11/19/12 0555 11/20/12 0515  NA 139 139 137  K 3.5 4.3 3.9  CL 102 103 101  CO2 27 27 29  GLUCOSE 194* 216* 208*  BUN 19 17 18  CREATININE 0.96 0.89 0.86  CALCIUM 8.4 8.4 8.8  MG  --  1.9  --    CBC:  Recent Labs  11/17/12 2225 11/19/12 0555 11/20/12 0515  WBC 7.5 7.0 6.3  NEUTROABS 4.4  --   --   HGB 13.8 13.4 13.7  HCT 40.3 40.4 40.9  MCV 92.9 93.5 94.2  PLT 199 209 230   Cardiac Enzymes:  Recent Labs  11/17/12 2225 11/18/12 0520  TROPONINI <0.30 <0.30     Assessment/Plan:  1 atrial fibrillation-the patient remains in atrial fibrillation. Continue Cardizem and lopressor for rate control. Patient will need long-term anticoagulation as he has multiple embolic risk factors. Note TSH normal. Ejection fraction 50%. 2 ventricular septal defect-not well visualized on transthoracic echocardiogram. However right side is enlarged. Question if this is contributing to atrial fibrillation. I  would favor right and left heart catheterization to exclude coronary disease, check pulmonary pressures and measure QP/QS. Question if this may need to be closed. Apixiban on hold and plan to proceed with catheterization today. The risks and benefits were discussed and the patient agrees to proceed. Resume apixaban after. 3 chest pain-symptoms somewhat atypical. Proceed with catheterization as outlined in #2. 4 diabetes mellitus-management per primary care. 5 mild cardiomyopathy-possibly related to atrial fibrillation with a rapid ventricular response. Continue Cardizem and lopressor for rate control. Cardiac catheterization to exclude coronary disease.  Brian Crenshaw 11/20/2012, 7:27 AM    

## 2012-11-21 ENCOUNTER — Encounter (HOSPITAL_COMMUNITY): Payer: Self-pay | Admitting: Gastroenterology

## 2012-11-21 ENCOUNTER — Encounter (HOSPITAL_COMMUNITY)
Admission: AD | Disposition: A | Discharge status: Home / Self Care | Payer: Self-pay | Source: Other Acute Inpatient Hospital | Attending: Cardiology

## 2012-11-21 DIAGNOSIS — I2510 Atherosclerotic heart disease of native coronary artery without angina pectoris: Secondary | ICD-10-CM

## 2012-11-21 DIAGNOSIS — Q210 Ventricular septal defect: Secondary | ICD-10-CM

## 2012-11-21 DIAGNOSIS — I059 Rheumatic mitral valve disease, unspecified: Secondary | ICD-10-CM

## 2012-11-21 HISTORY — PX: TEE WITHOUT CARDIOVERSION: SHX5443

## 2012-11-21 LAB — CBC
HCT: 40.6 % (ref 39.0–52.0)
HCT: 41.3 % (ref 39.0–52.0)
Hemoglobin: 13.4 g/dL (ref 13.0–17.0)
Hemoglobin: 14.2 g/dL (ref 13.0–17.0)
MCH: 31.3 pg (ref 26.0–34.0)
MCHC: 33 g/dL (ref 30.0–36.0)
MCV: 93.7 fL (ref 78.0–100.0)
RBC: 4.28 MIL/uL (ref 4.22–5.81)
RBC: 4.41 MIL/uL (ref 4.22–5.81)
RDW: 12.3 % (ref 11.5–15.5)
WBC: 7.1 10*3/uL (ref 4.0–10.5)

## 2012-11-21 LAB — BASIC METABOLIC PANEL
BUN: 15 mg/dL (ref 6–23)
CO2: 28 mEq/L (ref 19–32)
Chloride: 106 mEq/L (ref 96–112)
GFR calc non Af Amer: >90 mL/min (ref >90)
Glucose, Bld: 174 mg/dL — ABNORMAL HIGH (ref 70–99)
Potassium: 3.9 mEq/L (ref 3.5–5.1)
Sodium: 141 mEq/L (ref 135–145)

## 2012-11-21 LAB — GLUCOSE, CAPILLARY: Glucose-Capillary: 83 mg/dL (ref 70–99)

## 2012-11-21 LAB — HEPARIN LEVEL (UNFRACTIONATED): Heparin Unfractionated: 0.33 IU/mL (ref 0.30–0.70)

## 2012-11-21 SURGERY — ECHOCARDIOGRAM, TRANSESOPHAGEAL
Anesthesia: Moderate Sedation

## 2012-11-21 MED ORDER — LIDOCAINE VISCOUS 2 % MT SOLN
OROMUCOSAL | Status: DC | PRN
  Administered 2012-11-21: 20 mL via OROMUCOSAL

## 2012-11-21 MED ORDER — DILTIAZEM HCL 60 MG PO TABS
60.0000 mg | ORAL_TABLET | Freq: Four times a day (QID) | ORAL | Status: DC
  Administered 2012-11-21 – 2012-11-23 (×7): 60 mg via ORAL
  Filled 2012-11-21 (×12): qty 1

## 2012-11-21 MED ORDER — MIDAZOLAM HCL 5 MG/ML IJ SOLN
INTRAMUSCULAR | Status: AC
  Filled 2012-11-21: qty 2

## 2012-11-21 MED ORDER — FENTANYL CITRATE 0.05 MG/ML IJ SOLN
INTRAMUSCULAR | Status: AC
  Filled 2012-11-21: qty 2

## 2012-11-21 MED ORDER — MIDAZOLAM HCL 10 MG/2ML IJ SOLN
INTRAMUSCULAR | Status: DC | PRN
  Administered 2012-11-21 (×3): 2 mg via INTRAVENOUS

## 2012-11-21 MED ORDER — LIDOCAINE VISCOUS 2 % MT SOLN
OROMUCOSAL | Status: AC
  Filled 2012-11-21: qty 15

## 2012-11-21 MED ORDER — FENTANYL CITRATE 0.05 MG/ML IJ SOLN
INTRAMUSCULAR | Status: DC | PRN
  Administered 2012-11-21 (×2): 25 ug via INTRAVENOUS

## 2012-11-21 MED ORDER — ATORVASTATIN CALCIUM 80 MG PO TABS
80.0000 mg | ORAL_TABLET | Freq: Every day | ORAL | Status: DC
  Administered 2012-11-21 – 2012-11-22 (×2): 80 mg via ORAL
  Filled 2012-11-21 (×3): qty 1

## 2012-11-21 NOTE — CV Procedure (Signed)
    TRANSESOPHAGEAL ECHOCARDIOGRAM   NAME:  Aaron Jones   MRN: [id number] DOB:  Sep 26, 1945   ADMIT DATE: 11/17/2012  INDICATIONS: VSD   PROCEDURE:   Informed consent was obtained prior to the procedure. The risks, benefits and alternatives for the procedure were discussed and the patient comprehended these risks.  Risks include, but are not limited to, cough, sore throat, vomiting, nausea, somnolence, esophageal and stomach trauma or perforation, bleeding, low blood pressure, aspiration, pneumonia, infection, trauma to the teeth and death.    After a procedural time-out, the patient was given 6 mg versed and 50 mcg fentanyl for moderate sedation.  The oropharynx was anesthetized 10 cc of topical 1% viscous lidocaine.  The transesophageal probe was inserted in the esophagus and stomach without difficulty and multiple views were obtained.    COMPLICATIONS:    There were no immediate complications.  FINDINGS:  LEFT VENTRICLE: EF = 50-55%. No regional wall motion abnormalities  INTRAVENTRICULAR SEPTUM: Small to moderate sized perimembranous VSD immediately under AoV with prominent L->R shunting  RIGHT VENTRICLE: Mildly dilated. Mild HK  LEFT ATRIUM: Dilated  LEFT ATRIAL APPENDAGE: No thrombus.   RIGHT ATRIUM: Dilated  AORTIC VALVE:  Trileaflet. Mild AI.  MITRAL VALVE:    Normal. Mild to moderate MR  TRICUSPID VALVE: Dilated annulus moderate TR  PULMONIC VALVE: Grossly normal. Mild PR  INTERATRIAL SEPTUM: No PFO or ASD.  PERICARDIUM: No effusion  DESCENDING AORTA: Mild plaque

## 2012-11-21 NOTE — Progress Notes (Signed)
Patient ID: Aaron Jones, male   DOB: 1945/10/20, 67 y.o.   MRN: [id number]      [address]       [place] [address]             [phone]        Deiontae Rabel [facility] Medical Record #[id number] Date of Birth: 1945/10/12  Referring: Dr Velta Addison Primary Care: Elmo Putt, MD  Chief Complaint:   Admitted with rapid afib new onset   History of Present Illness:     67 y.o. male with no history of CAD History of diabetes, hypertension, hypercholesterolemia. History of  tachycardia in 2013, possible SVT started  Cardizem. Follows with cardiologist, Dr. Lady Gary in [place].   About 2-3 weeks ago when he noticed he would become sob with climbing stairs and with doing yard work.  Symptoms improved with rest. For the past week and a half he has noticed increased ankle swelling and onset of PND. About a week ago he developed a tightening sensation around his chest and abdomen along with some muscle cramping In adductors with increased activity. The day of admission the tightening sensation in his chest and abdomen progressed to a heavy pressure that he rated a 6/10. He went to urgent care and then was sent to [facility], and [facility] and admitted on the hospitalist service .Patient reports that his pain  resolved as soon as he received cardizem.   At this time the patient is comfortable without chest pain or SOB   Current Activity/ Functional Status: Patient is independent with mobility/ambulation, transfers, ADL's, IADL's.   Zubrod Score: At the time of surgery this patient's most appropriate activity status/level should be described as: []  Normal activity, no symptoms [x]  Symptoms, fully ambulatory []  Symptoms, in bed less than or equal to 50% of the time []  Symptoms, in bed greater than 50% of the time but less than 100% []  Bedridden []  Moribund  Past Medical History  Diagnosis Date  . Hypertension   . Shortness of breath   . Sleep apnea     . Diabetes mellitus without complication   . Heart murmur   . GERD (gastroesophageal reflux disease)   . Hyperlipidemia   . Tachycardia, paroxysmal May 2013    Possible SVT    Past Surgical History  Procedure Laterality Date  . Nasal septum surgery  2004  . Cardiac catheterization  > 5 yr ago    Done at [facility], reportedly clean    History  Smoking status  . Never Smoker   Smokeless tobacco  . Never Used    History  Alcohol Use No    History   Social History  . Marital Status: Married    Spouse Name: N/A    Number of Children: N/A  . 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 Smoking status: Never Smoker   . Smokeless tobacco: Never Used  . Alcohol Use: No  . Drug Use: No  . Sexually Active: Not on file   Other Topics Concern  . Not on file   Social History Narrative   Still works full time. Works around the yard. Lives with wife. 2 sisters, neither with cardiac issues.    Allergies  Allergen Reactions  . Biaxin (Clarithromycin) Nausea Only    Current Facility-Administered Medications  Medication Dose Route Frequency Provider Last  Rate Last Dose  . 0.9 %  sodium chloride infusion  250 mL Intravenous PRN Lars Mage, MD 10 mL/hr at 11/18/12 0202 250 mL at 11/18/12 0202  . 0.9 %  sodium chloride infusion   Intravenous Continuous Bevelyn Buckles Bensimhon, MD      . 0.9 %  sodium chloride infusion   Intravenous Continuous Gery Pray, PA-C 10 mL/hr at 11/20/12 2212    . acetaminophen (TYLENOL) tablet 650 mg  650 mg Oral Q4H PRN Dolores Patty, MD      . aspirin chewable tablet 81 mg  81 mg Oral Daily Lewayne Bunting, MD   81 mg at 11/21/12 1726  . atorvastatin (LIPITOR) tablet 80 mg  80 mg Oral q1800 Lewayne Bunting, MD   80 mg at 11/21/12 1723  . diltiazem (CARDIZEM) tablet 60 mg  60 mg Oral Q6H Lewayne Bunting, MD   60 mg at 11/21/12 1723  . fentaNYL (SUBLIMAZE)  injection    PRN Dolores Patty, MD   25 mcg at 11/21/12 1534  . furosemide (LASIX) tablet 20 mg  20 mg Oral Daily Lewayne Bunting, MD   20 mg at 11/21/12 1723  . heparin ADULT infusion 100 units/mL (25000 units/250 mL)  1,500 Units/hr Intravenous Continuous Lonia Blood, MD 15 mL/hr at 11/21/12 1001 1,500 Units/hr at 11/21/12 1001  . insulin aspart (novoLOG) injection 0-9 Units  0-9 Units Subcutaneous TID WC Lars Mage, MD   3 Units at 11/20/12 1743  . insulin glargine (LANTUS) injection 18 Units  18 Units Subcutaneous QHS Lonia Blood, MD   18 Units at 11/20/12 2215  . lidocaine (XYLOCAINE) 2 % viscous mouth solution    PRN Dolores Patty, MD   20 mL at 11/21/12 1530  . metoprolol tartrate (LOPRESSOR) tablet 75 mg  75 mg Oral BID Russella Dar, NP   75 mg at 11/21/12 1722  . midazolam (VERSED) injection    PRN Dolores Patty, MD   2 mg at 11/21/12 1537  . morphine 2 MG/ML injection 2 mg  2 mg Intravenous Q4H PRN Lars Mage, MD      . ondansetron (ZOFRAN) tablet 4 mg  4 mg Oral Q6H PRN Lars Mage, MD       Or  . ondansetron (ZOFRAN) injection 4 mg  4 mg Intravenous Q6H PRN Lars Mage, MD      . ondansetron (ZOFRAN) injection 4 mg  4 mg Intravenous Q6H PRN Dolores Patty, MD      . sodium chloride 0.9 % injection 3 mL  3 mL Intravenous Q12H Lars Mage, MD   3 mL at 11/21/12 1239  . sodium chloride 0.9 % injection 3 mL  3 mL Intravenous PRN Lars Mage, MD   3 mL at 11/21/12 1239    Prescriptions prior to admission  Medication Sig Dispense Refill  . acetaminophen (TYLENOL) 500 MG tablet Take 500 mg by mouth every 6 (six) hours as needed for pain.      Marland Kitchen aspirin EC 81 MG tablet Take 81 mg by mouth daily.      Marland Kitchen atorvastatin (LIPITOR) 20 MG tablet Take 20 mg by mouth at bedtime.      Marland Kitchen diltiazem (CARDIZEM LA) 120 MG 24 hr tablet Take 120 mg by mouth daily.      Marland Kitchen lisinopril (PRINIVIL,ZESTRIL) 20 MG tablet Take 20 mg by mouth daily.      . metFORMIN (GLUCOPHAGE-XR) 500  MG 24  hr tablet Take 1,000 mg by mouth 2 (two) times daily.      . metoprolol succinate (TOPROL-XL) 100 MG 24 hr tablet Take 150 mg by mouth daily. Take with or immediately following a meal.      . omeprazole (PRILOSEC) 40 MG capsule Take 40 mg by mouth daily.      . pioglitazone (ACTOS) 30 MG tablet Take 30 mg by mouth daily.        History reviewed. No pertinent family history.   Review of Systems:     Cardiac Review of Systems: Y or N  Chest Pain [ y when in rapid af Laodice.Slates   ]  Resting SOB [ y  ] Exertional SOB  [ y ]  Pollyann Kennedy Milo.Brash  ]   Pedal Edema [ y  ]    Palpitations Cove.Etienne  ] Syncope  [ n ]   Presyncope [ n  ]  General Review of Systems: [Y] = yes [  ]=no Constitional: recent weight change Cove.Etienne  ]; anorexia [  ]; fatigue Cove.Etienne  ]; nausea [  ]; night sweats [  ]; fever [  ]; or chills [  ]                                                               Dental: poor dentition[ yes ]; Last Dentist visit: several years  Eye : blurred vision [  ]; diplopia [   ]; vision changes [n  ];  Amaurosis fugax[n  ]; Resp: cough [  ];  wheezing[  ];  hemoptysis[  ]; shortness of breath[ y ]; paroxysmal nocturnal dyspnea[y  ]; dyspnea on exertion[ y ]; or orthopnea[  ];  GI:  gallstones[  ], vomiting[  ];  dysphagia[  ]; melena[  ];  hematochezia [  ]; heartburn[  ];   Hx of  Colonoscopy[  ]; GU: kidney stones [  ]; hematuria[  ];   dysuria [  ];  nocturia[  ];  history of     obstruction [n  ]; urinary frequency [ n ]             Skin: rash, swelling[  ];, hair loss[  ];  peripheral edema[  ];  or itching[  ]; Musculosketetal: myalgias[  ];  joint swelling[  ];  joint erythema[  ];  joint pain[  ];  back pain[  ];  Heme/Lymph: bruising[  ];  bleeding[ n ];  anemia[  ];  Neuro: TIA[n  ];  headaches[  ];  stroke[  ];  vertigo[  ];  seizures[ n ];   paresthesias[ n ];  difficulty walking[  n];  Psych:depression[n  ]; anxiety[n  ];  Endocrine: diabetes[ n ];  thyroid dysfunction[  ];  Immunizations: Flu [  ];  Pneumococcal[  ];  Other:  Physical Exam: BP 152/104  Pulse 110  Temp(Src) 97.5 F (36.4 C) (Oral)  Resp 17  Ht 6' (1.829 m)  Wt 230 lb 9.6 oz (104.6 kg)  BMI 31.27 kg/m2  SpO2 96%  General appearance: alert, cooperative, appears older than stated age and no distress Neurologic: intact Heart: regular rate and rhythm and systolic murmur: holosystolic 3/6, blowing throughout the precordium Lungs: clear to auscultation bilaterally Abdomen: soft, non-tender;  bowel sounds normal; no masses,  no organomegaly Extremities: extremities normal, atraumatic, no cyanosis or edema and Homans sign is negative, no sign of DVT No carotid bruits, full femoral, dt and Pt pulses bilaterial  Diagnostic Studies & Laboratory data:     Recent Radiology Findings:  X-ray Chest Pa And Lateral   11/18/2012   *RADIOLOGY REPORT*  Clinical Data: Short of breath  CHEST - 2 VIEW  Comparison: None.  Findings: Heart silhouette is enlarged.  There is central venous pulmonary congestion.  Small bilateral pleural effusions.  No focal infiltrate.  No overt pulmonary edema. No acute osseous abnormality.  IMPRESSION: Cardiomegaly, central venous congestion, and small effusions.   Original Report Authenticated By: Genevive Bi, M.D.     Recent Lab Findings: Lab Results  Component Value Date   WBC 7.1 11/21/2012   HGB 14.2 11/21/2012   HCT 41.3 11/21/2012   PLT 228 11/21/2012   GLUCOSE 174* 11/21/2012   ALT 65* 11/17/2012   AST 36 11/17/2012   NA 141 11/21/2012   K 3.9 11/21/2012   CL 106 11/21/2012   CREATININE 0.83 11/21/2012   BUN 15 11/21/2012   CO2 28 11/21/2012   TSH 1.571 11/17/2012   INR 1.10 11/18/2012   HGBA1C 10.6* 11/17/2012   NGE:XBMWUXLK:  LEFT VENTRICLE: EF = 50-55%. No regional wall motion abnormalities  INTRAVENTRICULAR SEPTUM: Small to moderate sized perimembranous VSD immediately under AoV with prominent L->R shunting   RIGHT VENTRICLE: Mildly dilated. Mild HK  LEFT ATRIUM: Dilated  LEFT ATRIAL  APPENDAGE: No thrombus.  RIGHT ATRIUM: Dilated  AORTIC VALVE: Trileaflet. Mild AI.  MITRAL VALVE: Normal. Mild to moderate MR  TRICUSPID VALVE: Dilated annulus moderate TR  PULMONIC VALVE: Grossly normal. Mild PR  INTERATRIAL SEPTUM: No PFO or ASD.  PERICARDIUM: No effusion  DESCENDING AORTA: Mild plaque   CATH:Cardiac Cath Procedure Note  Indication: CP (in setting of AF with RVR) and VSD  Procedures performed:  1) Right heart cathererization  2) Selective coronary angiography  3) Left heart catheterization  4) Left ventriculogram  5) Shunt run  Description of procedure:   The risks and indication of the procedure were explained. Consent was signed and placed on the chart. An appropriate timeout was taken prior to the procedure. The right groin was prepped and draped in the routine sterile fashion and anesthetized with 1% local lidocaine.  A 5 FR arterial sheath was placed in the right femoral artery using a modified Seldinger technique. Standard catheters including a JL4, JR4 and angled pigtail were used. All catheter exchanges were made over a wire. A 7 FR venous sheath was placed in the right femoral vein using a modified Seldinger technique. A standard Swan-Ganz catheter was used for the procedure.  Complications: None apparent  Total contrast: 80 cc  Findings:  RA = 19  RV = 45/15/18  PA = 48/24 (34)  PCW = 22 (v = 35)  Fick cardiac output/index = 4.9/2.2  PVR = 1.2 Woods  SVR = 1040  FA sat = 97%  SVC sat = 62%  RA sat = 64%  RV sat = 78%  PA sat = 81%  Qp/QS = 2.1  Ao Pressure: 116/69 (86)  LV Pressure: 106/13/18  There was no signficant gradient across the aortic valve on pullback.  Left main: Normal  LAD: Mild plaque in proximal portion  Ramus: Very large branching vessel. Normal  LCX: Small vessel. normal  RCA: Dominant vessel. 20% mid. 80-90% hazy lesion in ostium  of small to moderate -sized PDA (2.25 mm)  LV-gram done in the RAO projection: Ejection fraction  = 45% global HK  Assessment:  1. 1v CAD as described above  2. Significantly elevated R-sided filling pressure  3. VSD with evidence of moderate L -> R shunt by Qp/QS ratio  4. EF 45%  Plan/Discussion:  Based on cath numbers VSD appears hemodynamically significant. Will arrange TEE.  He has significant lesion in small-to-moderate sized PDA. CP occurred during rapid AF but uncommon with exertion. I reviewed with Dr. Clifton James lesion is approachable percutaneously but given lack of significant exertional CP and possible need for CT surgery for VSD will treat medically for now.  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 I can see the patient in the office after dental issues are resolved to further review with him and his wife the planned procedure. Each time I have seen the patient his wife has not been here.       Delight Ovens MD      [address] [place] [place] [address] Office [phone]   Beeper [phone]  11/22/2012 7:26 PM

## 2012-11-21 NOTE — Progress Notes (Signed)
   Subjective:  Denies CP or dyspnea   Objective:  Filed Vitals:   11/20/12 1900 11/20/12 2000 11/21/12 0000 11/21/12 0400  BP:  127/102 106/67 112/86  Pulse: 89 93    Temp: 97.1 F (36.2 C)  97.6 F (36.4 C) 99.5 F (37.5 C)  TempSrc: Oral  Oral Oral  Resp: 19 23 15 16   Height:      Weight:      SpO2: 97% 95% 96% 97%    Intake/Output from previous day:  Intake/Output Summary (Last 24 hours) at 11/21/12 0725 Last data filed at 11/21/12 0700  Gross per 24 hour  Intake 1135.83 ml  Output   2000 ml  Net -864.17 ml    Physical Exam: Physical exam: Well-developed well-nourished in no acute distress.  Skin is warm and dry.  HEENT is normal.  Neck is supple.  Chest is clear to auscultation with normal expansion.  Cardiovascular exam is irregular, 3/6 systolic murmur LSB Abdominal exam nontender or distended. No masses palpated. Extremities show trace edema. neuro grossly intact    Lab Results: Basic Metabolic Panel:  Recent Labs  16/10/96 0555 11/20/12 0515 11/21/12 0105  NA 139 137 141  K 4.3 3.9 3.9  CL 103 101 106  CO2 27 29 28   GLUCOSE 216* 208* 174*  BUN 17 18 15   CREATININE 0.89 0.86 0.83  CALCIUM 8.4 8.8 8.8  MG 1.9  --   --    CBC:  Recent Labs  11/20/12 0515 11/21/12 0105  WBC 6.3 6.3  HGB 13.7 13.4  HCT 40.9 40.6  MCV 94.2 94.9  PLT 230 209     Assessment/Plan:  1 atrial fibrillation-the patient remains in atrial fibrillation; rate elevated. Continue Cardizem (increase to 60 mg po every 6 hours) and lopressor for rate control. Continue heparin for now. Note TSH normal. Ejection fraction 50%. 2 ventricular septal defect-not well visualized on transthoracic echocardiogram. However right side is enlarged. Cath suggests signifcant shunt based on QP/QS. Plan TEE today to better visualize. Will ask CVTS to evaluate for VSD closure; may also need Maze. 3 CAD - continue ASA; add statin. 4 diabetes mellitus-follow CBG 5 mild  cardiomyopathy-possibly related to atrial fibrillation with a rapid ventricular response. Continue Cardizem and lopressor for rate control.   Olga Millers 11/21/2012, 7:25 AM

## 2012-11-21 NOTE — Progress Notes (Signed)
ANTICOAGULATION CONSULT NOTE  Pharmacy Consult for heparin Indication: afib and CP  Allergies  Allergen Reactions  . Biaxin (Clarithromycin) Nausea Only    Patient Measurements: Height: 6' (182.9 cm) Weight: 230 lb 9.6 oz (104.6 kg) IBW/kg (Calculated) : 77.6 Heparin Dosing Weight: 99.7 kg  Vital Signs: Temp: 97.6 F (36.4 C) (06/19 0000) Temp src: Oral (06/19 0000) BP: 106/67 mmHg (06/19 0000) Pulse Rate: 93 (06/18 2000)  Labs:  Recent Labs  11/18/12 0520 11/19/12 0555 11/20/12 0515 11/21/12 0105  HGB  --  13.4 13.7  --   HCT  --  40.4 40.9  --   PLT  --  209 230  --   LABPROT 14.1  --   --   --   INR 1.10  --   --   --   HEPARINUNFRC  --   --   --  0.33  CREATININE  --  0.89 0.86  --   TROPONINI <0.30  --   --   --     Estimated Creatinine Clearance: 107.1 ml/min (by C-G formula based on Cr of 0.86).  Assessment: 67 yo male with afib s/p cath for heparin.   Goal of Therapy:  Heparin level 0.3-0.7 units/ml Monitor platelets by anticoagulation protocol: Yes   Plan:  Continue Heparin at current rate Follow-up am labs.  3) Daily heparin level and CBC 4) F/u plan on CT surgery  Aaron Jones, Gary Fleet 11/21/2012,1:43 AM

## 2012-11-21 NOTE — Progress Notes (Signed)
ANTICOAGULATION CONSULT NOTE  Pharmacy Consult for heparin Indication: afib and CP  Allergies  Allergen Reactions  . Biaxin (Clarithromycin) Nausea Only    Patient Measurements: Height: 6' (182.9 cm) Weight: 230 lb 9.6 oz (104.6 kg) IBW/kg (Calculated) : 77.6 Heparin Dosing Weight: 99.7 kg  Vital Signs: Temp: 98.1 F (36.7 C) (06/19 1412) Temp src: Oral (06/19 1412) BP: 152/86 mmHg (06/19 1412) Pulse Rate: 115 (06/19 1412)  Labs:  Recent Labs  11/19/12 0555 11/20/12 0515 11/21/12 0105 11/21/12 0740  HGB 13.4 13.7 13.4 14.2  HCT 40.4 40.9 40.6 41.3  PLT 209 230 209 228  HEPARINUNFRC  --   --  0.33 0.61  CREATININE 0.89 0.86 0.83  --     Estimated Creatinine Clearance: 110.9 ml/min (by C-G formula based on Cr of 0.83).  Assessment: 67 yo male admitted 11/17/2012  with afib s/p cath for heparin.  Heparin at goal.  No bleeding noted.   Goal of Therapy:  Heparin level 0.3-0.7 units/ml Monitor platelets by anticoagulation protocol: Yes   Plan:  Continue Heparin at current rate Follow-up am labs.  3) Daily heparin level and CBC 4) F/u plan on CT surgery   Thank you for allowing pharmacy to be a part of this patients care team.  Lovenia Kim Pharm.D., BCPS Clinical Pharmacist 11/21/2012 2:17 PM Pager: [phone] Phone: [phone]

## 2012-11-21 NOTE — Progress Notes (Signed)
Echocardiogram Echocardiogram Transesophageal has been performed.  Aaron Jones 11/21/2012, 4:16 PM

## 2012-11-21 NOTE — Progress Notes (Signed)
TEE scheduled for 3pm with Dr. Gala Romney. Dayna Dunn PA-C

## 2012-11-21 NOTE — Interval H&P Note (Signed)
History and Physical Interval Note:  11/21/2012 3:27 PM  Aaron Jones  has presented today for surgery, with the diagnosis of  ventricular septal defect with shunt  The various methods of treatment have been discussed with the patient and family. After consideration of risks, benefits and other options for treatment, the patient has consented to  Procedure(s): TRANSESOPHAGEAL ECHOCARDIOGRAM (TEE) (N/A) as a surgical intervention .  The patient's history has been reviewed, patient examined, no change in status, stable for surgery.  I have reviewed the patient's chart and labs.  Questions were answered to the patient's satisfaction.     Daniel Bensimhon

## 2012-11-21 NOTE — H&P (View-Only) (Signed)
   Subjective:  Denies CP or dyspnea   Objective:  Filed Vitals:   11/20/12 1900 11/20/12 2000 11/21/12 0000 11/21/12 0400  BP:  127/102 106/67 112/86  Pulse: 89 93    Temp: 97.1 F (36.2 C)  97.6 F (36.4 C) 99.5 F (37.5 C)  TempSrc: Oral  Oral Oral  Resp: 19 23 15 16  Height:      Weight:      SpO2: 97% 95% 96% 97%    Intake/Output from previous day:  Intake/Output Summary (Last 24 hours) at 11/21/12 0725 Last data filed at 11/21/12 0700  Gross per 24 hour  Intake 1135.83 ml  Output   2000 ml  Net -864.17 ml    Physical Exam: Physical exam: Well-developed well-nourished in no acute distress.  Skin is warm and dry.  HEENT is normal.  Neck is supple.  Chest is clear to auscultation with normal expansion.  Cardiovascular exam is irregular, 3/6 systolic murmur LSB Abdominal exam nontender or distended. No masses palpated. Extremities show trace edema. neuro grossly intact    Lab Results: Basic Metabolic Panel:  Recent Labs  11/19/12 0555 11/20/12 0515 11/21/12 0105  NA 139 137 141  K 4.3 3.9 3.9  CL 103 101 106  CO2 27 29 28  GLUCOSE 216* 208* 174*  BUN 17 18 15  CREATININE 0.89 0.86 0.83  CALCIUM 8.4 8.8 8.8  MG 1.9  --   --    CBC:  Recent Labs  11/20/12 0515 11/21/12 0105  WBC 6.3 6.3  HGB 13.7 13.4  HCT 40.9 40.6  MCV 94.2 94.9  PLT 230 209     Assessment/Plan:  1 atrial fibrillation-the patient remains in atrial fibrillation; rate elevated. Continue Cardizem (increase to 60 mg po every 6 hours) and lopressor for rate control. Continue heparin for now. Note TSH normal. Ejection fraction 50%. 2 ventricular septal defect-not well visualized on transthoracic echocardiogram. However right side is enlarged. Cath suggests signifcant shunt based on QP/QS. Plan TEE today to better visualize. Will ask CVTS to evaluate for VSD closure; may also need Maze. 3 CAD - continue ASA; add statin. 4 diabetes mellitus-follow CBG 5 mild  cardiomyopathy-possibly related to atrial fibrillation with a rapid ventricular response. Continue Cardizem and lopressor for rate control.   Aaron Jones 11/21/2012, 7:25 AM    

## 2012-11-22 ENCOUNTER — Encounter (HOSPITAL_COMMUNITY): Payer: Self-pay | Admitting: Dentistry

## 2012-11-22 ENCOUNTER — Inpatient Hospital Stay (HOSPITAL_COMMUNITY): Payer: Managed Care, Other (non HMO)

## 2012-11-22 DIAGNOSIS — I2510 Atherosclerotic heart disease of native coronary artery without angina pectoris: Secondary | ICD-10-CM

## 2012-11-22 DIAGNOSIS — Q210 Ventricular septal defect: Secondary | ICD-10-CM

## 2012-11-22 LAB — BASIC METABOLIC PANEL
BUN: 12 mg/dL (ref 6–23)
Calcium: 8.7 mg/dL (ref 8.4–10.5)
GFR calc non Af Amer: >90 mL/min (ref >90)
Glucose, Bld: 161 mg/dL — ABNORMAL HIGH (ref 70–99)
Sodium: 139 mEq/L (ref 135–145)

## 2012-11-22 LAB — CBC
HCT: 40.3 % (ref 39.0–52.0)
MCHC: 33.7 g/dL (ref 30.0–36.0)
RDW: 12.4 % (ref 11.5–15.5)

## 2012-11-22 NOTE — Consult Note (Signed)
DENTAL CONSULTATION  Date of Consultation:  11/22/2012 Patient Name:   Aaron Jones Date of Birth:   [birth year] Medical Record Number: [id number]  VITALS: BP 126/74  Pulse 85  Temp(Src) 98 F (36.7 C) (Oral)  Resp 15  Ht 6' (1.829 m)  Wt 230 lb 9.6 oz (104.6 kg)  BMI 31.27 kg/m2  SpO2 96%   CHIEF COMPLAINT: The patient was referred by Dr. Tyrone Sage for a dental consultation.  HPI: Aaron Jones is a 67 year old male recently diagnosed with a ventricular-septal defect. The patient with anticipated heart surgery for ventricular septal defect repair and possible maze procedure Dr. Tyrone Sage.  A dental consultation was requested to rule out dental infection that may affect the patient's systemic health and anticipated heart surgery and put the patient at risk for endocarditis.  The patient currently denies acute toothache, swellings, or abscesses. Patient was last seen for" a long time ago". This is greater than 10-15 years. Patient had a maxillary partial denture and lower partial denture fabricated at that time by Dr. Conni Slipper in [place], [place]. Patient currently no longer wears the lower partial denture. Upper partial denture" fits pretty good" by patient report.  The patient may be interested in having all remaining teeth extracted at this time due to economic concerns.  The patient indicates that he broke off a lower crown approximately 2 years ago. Patient denies having any dental pain associated with this loss of crown. Patient indicates that he " don't like dentist's".    Patient Active Problem List   Diagnosis Date Noted  . Hypokalemia 11/18/2012  . Edema of left lower extremity 11/18/2012  . Mild difuse Hypokinesis 11/18/2012  . ? Ventricular septal defect (VSD), membranous 11/18/2012  . Mild Pulmonary HTN 11/18/2012  . Atrial fibrillation with rapid ventricular response 11/17/2012  . Essential hypertension, benign 11/17/2012  . Type II or unspecified type diabetes  mellitus without mention of complication, not stated as uncontrolled 11/17/2012  . Dyslipidemia 11/17/2012  . Heart murmur 11/17/2012     PMH: Past Medical History  Diagnosis Date  . Hypertension   . Shortness of breath   . Sleep apnea   . Diabetes mellitus without complication   . Heart murmur   . GERD (gastroesophageal reflux disease)   . Hyperlipidemia   . Tachycardia, paroxysmal May 2013    Possible SVT    PSH: Past Surgical History  Procedure Laterality Date  . Nasal septum surgery  2004  . Cardiac catheterization  > 5 yr ago    Done at [facility], reportedly clean    ALLERGIES: Allergies  Allergen Reactions  . Biaxin (Clarithromycin) Nausea Only    MEDICATIONS: Current Facility-Administered Medications  Medication Dose Route Frequency Provider Last Rate Last Dose  . 0.9 %  sodium chloride infusion  250 mL Intravenous PRN Lars Mage, MD 10 mL/hr at 11/18/12 0202 250 mL at 11/18/12 0202  . 0.9 %  sodium chloride infusion   Intravenous Continuous Dolores Patty, MD      . acetaminophen (TYLENOL) tablet 650 mg  650 mg Oral Q4H PRN Dolores Patty, MD      . aspirin chewable tablet 81 mg  81 mg Oral Daily Lewayne Bunting, MD   81 mg at 11/22/12 1013  . atorvastatin (LIPITOR) tablet 80 mg  80 mg Oral q1800 Lewayne Bunting, MD   80 mg at 11/21/12 1723  . diltiazem (CARDIZEM) tablet 60 mg  60 mg Oral Q6H Lewayne Bunting, MD  60 mg at 11/22/12 0455  . furosemide (LASIX) tablet 20 mg  20 mg Oral Daily Lewayne Bunting, MD   20 mg at 11/22/12 1013  . heparin ADULT infusion 100 units/mL (25000 units/250 mL)  1,500 Units/hr Intravenous Continuous Lonia Blood, MD 15 mL/hr at 11/22/12 0447 1,500 Units/hr at 11/22/12 0447  . insulin aspart (novoLOG) injection 0-9 Units  0-9 Units Subcutaneous TID WC Lars Mage, MD   1 Units at 11/22/12 0816  . insulin glargine (LANTUS) injection 18 Units  18 Units Subcutaneous QHS Lonia Blood, MD   18 Units at 11/21/12 2135   . metoprolol tartrate (LOPRESSOR) tablet 75 mg  75 mg Oral BID Russella Dar, NP   75 mg at 11/22/12 1013  . morphine 2 MG/ML injection 2 mg  2 mg Intravenous Q4H PRN Lars Mage, MD      . ondansetron (ZOFRAN) tablet 4 mg  4 mg Oral Q6H PRN Lars Mage, MD       Or  . ondansetron (ZOFRAN) injection 4 mg  4 mg Intravenous Q6H PRN Lars Mage, MD      . ondansetron (ZOFRAN) injection 4 mg  4 mg Intravenous Q6H PRN Bevelyn Buckles Bensimhon, MD      . sodium chloride 0.9 % injection 3 mL  3 mL Intravenous Q12H Lars Mage, MD   3 mL at 11/22/12 1012  . sodium chloride 0.9 % injection 3 mL  3 mL Intravenous PRN Lars Mage, MD   3 mL at 11/21/12 1239    LABS: Lab Results  Component Value Date   WBC 6.7 11/22/2012   HGB 13.6 11/22/2012   HCT 40.3 11/22/2012   MCV 94.8 11/22/2012   PLT 224 11/22/2012      Component Value Date/Time   NA 139 11/22/2012 0500   K 3.8 11/22/2012 0500   CL 105 11/22/2012 0500   CO2 26 11/22/2012 0500   GLUCOSE 161* 11/22/2012 0500   BUN 12 11/22/2012 0500   CREATININE 0.70 11/22/2012 0500   CALCIUM 8.7 11/22/2012 0500   GFRNONAA >90 11/22/2012 0500   GFRAA >90 11/22/2012 0500   Lab Results  Component Value Date   INR 1.10 11/18/2012   INR 1.07 11/17/2012   No results found for this basename: PTT    SOCIAL HISTORY: History   Social History  . Marital Status: Married    Spouse Name: N/A    Number of Children: N/A  . 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 Smoking status: Never Smoker   . Smokeless tobacco: Never Used  . Alcohol Use: No  . Drug Use: No  . Sexually Active: Not on file   Other Topics Concern  . Not on file   Social History Narrative   Still works full time. Works around the yard. Lives with wife. 2 sisters, neither with cardiac issues.    FAMILY HISTORY: History reviewed. No pertinent family history.   REVIEW OF SYSTEMS: Reviewed from chart for this  admission.  DENTAL HISTORY: CHIEF COMPLAINT: The patient was referred by Dr. Tyrone Sage for a dental consultation.  HPI: Aaron Jones is a 67 year old male recently diagnosed with a ventricular-septal defect. The patient with anticipated heart surgery for ventricular septal defect repair and possible maze procedure Dr. Tyrone Sage.  A dental consultation was requested to rule out dental infection that may affect the patient's systemic health  and anticipated heart surgery and put the patient risk for endocarditis.  The patient currently denies acute toothache, swellings, or abscesses. Patient was last seen for" a long time ago". This is greater than 10-15 years. Patient had a maxillary partial denture and lower partial denture fabricated at that time by Dr. Conni Slipper in [place], [place]. Patient currently no longer wears the lower partial denture. Upper partial denture" fits pretty good" by patient report.  The patient may be interested in having all remaining teeth extracted at this time due to economic concerns.  The patient indicates that he broke off a lower crown approximately 2 years ago. Patient denies having any dental pain associated with this loss of crown. Patient indicates that he " don't like dentist's".     DENTAL EXAMINATION:  GENERAL: The patient is a well-developed, well-nourished male in no acute distress. HEAD AND NECK: There is no palpable submandibular lymphadenopathy. The patient denies acute TMJ symptoms. INTRAORAL EXAM: The patient has normal saliva. I do not see any evidence of abscess formation. DENTITION: The patient is missing tooth numbers 1, 6, 7, 8, 9, 10, 11, 12, 13, 16, 18, 19, 20, 28, 29, 30, and 31. Tooth #32 is present as retained root segments. PERIODONTAL: Patient has chronic periodontitis with plaque and calculus accumulations, selective areas of gingival recession but no significant tooth mobility noted. DENTAL CARIES/SUBOPTIMAL RESTORATIONS: The patient  has multiple dental caries associated of a mandibular anterior teeth. Patient also could have or extensive dental caries associated with the roots of the crowns but I am unable to evaluate this thoroughly at this time. Ideally a full series of periapical radiographs will be obtained to help identify other dental caries and periapical pathology. This would need to be done in an outpatient setting in the hospital dental clinic. ENDODONTIC: The patient currently denies acute pulpitis symptoms. There is no evidence of periapical pathology or radiolucency. Tooth #32 has retained roots with previous root canal therapy. CROWN AND BRIDGE: Patient has multiple crown or bridge restorations. They appear to be clinically acceptable although him oral examination of the crown margins for recurrent caries would need to be performed to ideally rule out caries at the gum line. The crowns on tooth numbers 5 and 14 had attachments to assist in retention of the maxillary partial denture. PROSTHODONTIC: Patient has a maxillary and mandibular partial denture. Patient no longer wears lower partial denture. The maxillary partial denture is ill fitting. OCCLUSION: Patient has a poor occlusal scheme secondary to multiple missing teeth, supra-eruption and drifting of the unopposed teeth into the edentulous areas and lack of replacement of all missing teeth with clinically acceptable dental prostheses.  RADIOGRAPHIC INTERPRETATION: A suboptimal panoramic x-ray was obtained on 11/22/2012. There are multiple missing teeth. There are retained root segments in the area tooth #32. Dental caries are noted. No obvious periapical radiolucency or pathologies noted. Multiple crown restorations are noted.   ASSESSMENTS: 1. Chronic periodontitis with bone loss 2. Plaque and calculus accumulations 3. Gingival recession 4. No significant tooth mobility 5. Multiple missing teeth 6. Retained root segments in the area of #32. 7.  Supra-eruption and drifting of the unopposed teeth into the edentulous areas 8. Dental caries associated a mandibular anterior teeth. Ideally a incipient dental cariesfull series of dental radius would be obtained to identify other caries. 9. Ill fitting maxillary partial denture and a lower partial denture that the patient longer wears 10. Anticoagulant therapy with risk for bleeding with invasive dental 11. Significant cardiovascular compromise with  the risk for complications up to and including death with anticipated invasive dental procedures.   PLAN/RECOMMENDATIONS: 1. I discussed the risks, benefits, and complications of various treatment options with the patient in relationship to  his medical and dental conditions, ventricular septal defect, anticipated ventricular septal defect repair surgery. We discussed various treatment options to include no treatment, extraction of our main teeth with alveoloplasty, selective extractions with alveoloplasty, pre-prosthetic surgery as indicated, periodontal therapy, dental restorations, root canal therapy, crown and bridge therapy, implant therapy, and replacement of missing teeth as indicated. We also discussed obtaining a full series of dental radiographs and a more comprehensive dental examination by dental medicine or a primary dentist in [place] as an outpatient if this is possible. The patient currently wishes to  think  about his options and discuss this with his wife. The patient was considering possible full mouth extractions with alveoloplasty and subsequent fabrication of upper and lower complete dentures after adequate healing. Patient also was considering selective extraction of the retained root segments in the area numbers 32 and possibly other teeth as indicated along with gross debridement of remaining dentition in the operating room. Patient understands that he will need to dental followup for other comprehensive dental care if  he chooses this selective treatment option. Patient is aware of the potential for bleeding with invasive dental procedures down in the future due to the anticoagulant therapy.   2. Discussion of findings with medical team and coordination of future medical and dental care as indicated.   Charlynne Pander, DDS

## 2012-11-22 NOTE — Progress Notes (Signed)
   Subjective:  Denies CP or dyspnea   Objective:  Filed Vitals:   11/22/12 0300 11/22/12 0400 11/22/12 0500 11/22/12 0600  BP: 116/45 118/73 145/84 119/74  Pulse:      Temp: 97.5 F (36.4 C)     TempSrc: Oral     Resp: 19 14 19 19   Height:      Weight:      SpO2:  97%      Intake/Output from previous day:  Intake/Output Summary (Last 24 hours) at 11/22/12 0701 Last data filed at 11/22/12 0600  Gross per 24 hour  Intake   1451 ml  Output   2400 ml  Net   -949 ml    Physical Exam: Physical exam: Well-developed well-nourished in no acute distress.  Skin is warm and dry.  HEENT is normal.  Neck is supple.  Chest is clear to auscultation with normal expansion.  Cardiovascular exam is irregular, 3/6 systolic murmur LSB Abdominal exam nontender or distended. No masses palpated. Extremities show trace edema. neuro grossly intact    Lab Results: Basic Metabolic Panel:  Recent Labs  11/91/47 0105 11/22/12 0500  NA 141 139  K 3.9 3.8  CL 106 105  CO2 28 26  GLUCOSE 174* 161*  BUN 15 12  CREATININE 0.83 0.70  CALCIUM 8.8 8.7   CBC:  Recent Labs  11/21/12 0740 11/22/12 0500  WBC 7.1 6.7  HGB 14.2 13.6  HCT 41.3 40.3  MCV 93.7 94.8  PLT 228 224     Assessment/Plan:  1 atrial fibrillation-the patient remains in atrial fibrillation; rate controlled. Continue Cardizem and lopressor for rate control. Continue heparin for now. Note TSH normal. Ejection fraction 50%. 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 Right side mildly enlarged. Cath suggests signifcant shunt based on QP/QS. RAE/RVE most likely from VSD and contributing to atrial fibrillation. Will ask CVTS to evaluate for VSD closure; may also need Maze. 3 CAD - continue ASA; add statin. 4 diabetes mellitus-follow CBG 5 mild cardiomyopathy-possibly related to atrial fibrillation with a rapid ventricular response. Continue Cardizem and lopressor  for rate control.  Transfer to telemetry. Olga Millers 11/22/2012, 7:01 AM

## 2012-11-22 NOTE — Progress Notes (Signed)
ANTICOAGULATION CONSULT NOTE  Pharmacy Consult for heparin Indication: afib and CP  Allergies  Allergen Reactions  . Biaxin (Clarithromycin) Nausea Only   Patient Measurements: Height: 6' (182.9 cm) Weight: 230 lb 9.6 oz (104.6 kg) IBW/kg (Calculated) : 77.6 Heparin Dosing Weight: 99.7 kg  Vital Signs: Temp: 98.2 F (36.8 C) (06/20 1145) Temp src: Oral (06/20 1145) BP: 130/70 mmHg (06/20 1133) Pulse Rate: 90 (06/20 1145)  Labs:  Recent Labs  11/20/12 0515 11/21/12 0105 11/21/12 0740 11/22/12 0500  HGB 13.7 13.4 14.2 13.6  HCT 40.9 40.6 41.3 40.3  PLT 230 209 228 224  HEPARINUNFRC  --  0.33 0.61 0.54  CREATININE 0.86 0.83  --  0.70    Estimated Creatinine Clearance: 115.1 ml/min (by C-G formula based on Cr of 0.7).  Assessment: 67 yo male admitted 11/17/2012  with afib s/p cath for heparin.  Heparin at goal.  No bleeding or complications noted per chart notes.   Goal of Therapy:  Heparin level 0.3-0.7 units/ml Monitor platelets by anticoagulation protocol: Yes   Plan:  1) Continue IV heparin at current rate. 2) Daily heparin level and CBC 3) F/u plan on CT surgery   Thank you for allowing pharmacy to be a part of this patients care team.

## 2012-11-23 DIAGNOSIS — I5031 Acute diastolic (congestive) heart failure: Secondary | ICD-10-CM | POA: Diagnosis present

## 2012-11-23 LAB — CBC
HCT: 41.8 % (ref 39.0–52.0)
MCH: 31.7 pg (ref 26.0–34.0)
MCHC: 33.3 g/dL (ref 30.0–36.0)
MCV: 95.4 fL (ref 78.0–100.0)
RDW: 12.4 % (ref 11.5–15.5)

## 2012-11-23 LAB — GLUCOSE, CAPILLARY: Glucose-Capillary: 174 mg/dL — ABNORMAL HIGH (ref 70–99)

## 2012-11-23 MED ORDER — PNEUMOCOCCAL VAC POLYVALENT 25 MCG/0.5ML IJ INJ
0.5000 mL | INJECTION | INTRAMUSCULAR | Status: AC
  Administered 2012-11-24: 0.5 mL via INTRAMUSCULAR
  Filled 2012-11-23: qty 0.5

## 2012-11-23 MED ORDER — FUROSEMIDE 10 MG/ML IJ SOLN
40.0000 mg | Freq: Two times a day (BID) | INTRAMUSCULAR | Status: DC
  Administered 2012-11-23 – 2012-11-25 (×4): 40 mg via INTRAVENOUS
  Filled 2012-11-23 (×6): qty 4

## 2012-11-23 MED ORDER — DILTIAZEM HCL ER COATED BEADS 360 MG PO CP24
360.0000 mg | ORAL_CAPSULE | Freq: Every day | ORAL | Status: DC
  Administered 2012-11-23 – 2012-11-25 (×3): 360 mg via ORAL
  Filled 2012-11-23 (×3): qty 1

## 2012-11-23 MED ORDER — APIXABAN 5 MG PO TABS
5.0000 mg | ORAL_TABLET | Freq: Two times a day (BID) | ORAL | Status: DC
  Administered 2012-11-23 – 2012-11-25 (×5): 5 mg via ORAL
  Filled 2012-11-23 (×6): qty 1

## 2012-11-23 MED ORDER — ATORVASTATIN CALCIUM 20 MG PO TABS
20.0000 mg | ORAL_TABLET | Freq: Every day | ORAL | Status: DC
  Administered 2012-11-23 – 2012-11-24 (×2): 20 mg via ORAL
  Filled 2012-11-23 (×3): qty 1

## 2012-11-23 MED ORDER — POTASSIUM CHLORIDE CRYS ER 20 MEQ PO TBCR
20.0000 meq | EXTENDED_RELEASE_TABLET | Freq: Two times a day (BID) | ORAL | Status: DC
  Administered 2012-11-23 – 2012-11-24 (×4): 20 meq via ORAL
  Filled 2012-11-23 (×7): qty 1

## 2012-11-23 NOTE — Progress Notes (Addendum)
   Subjective:   Denies CP or dyspnea   Objective:  Filed Vitals:   11/22/12 2000 11/23/12 0000 11/23/12 0400 11/23/12 0841  BP: 126/86 102/53 123/74 124/65  Pulse:    74  Temp: 97.7 F (36.5 C) 98.2 F (36.8 C)  98.1 F (36.7 C)  TempSrc: Oral Oral  Oral  Resp:   16   Height:      Weight:      SpO2:  98% 97% 98%    Intake/Output from previous day:  Intake/Output Summary (Last 24 hours) at 11/23/12 1100 Last data filed at 11/23/12 0900  Gross per 24 hour  Intake   1470 ml  Output   3075 ml  Net  -1605 ml    Physical Exam: Physical exam: Well-developed well-nourished in no acute distress.  Skin is warm and dry.  HEENT is normal.  Neck is supple. JVP 10 Chest is clear to auscultation with normal expansion.  Cardiovascular exam is irregular, 3/6 systolic murmur LSB Abdominal exam nontender or distended. No masses palpated. Extremities shows 2+ edema. neuro grossly intact    Lab Results: Basic Metabolic Panel:  Recent Labs  54/09/81 0105 11/22/12 0500  NA 141 139  K 3.9 3.8  CL 106 105  CO2 28 26  GLUCOSE 174* 161*  BUN 15 12  CREATININE 0.83 0.70  CALCIUM 8.8 8.7   CBC:  Recent Labs  11/22/12 0500 11/23/12 0725  WBC 6.7 6.2  HGB 13.6 13.9  HCT 40.3 41.8  MCV 94.8 95.4  PLT 224 216     Assessment/Plan:  1 atrial fibrillation-the patient remains in atrial fibrillation; rate controlled. Continue Cardizem and lopressor for rate control. Note TSH normal. Ejection fraction 50%. 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 Right side mildly enlarged. Cath suggests signifcant shunt based on QP/QS. RAE/RVE most likely from VSD and contributing to atrial fibrillation. Will ask CVTS to evaluate for VSD closure; may also need Maze. 3 CAD - continue ASA; add statin. 4 diabetes mellitus-follow CBG 5 mild cardiomyopathy-likely related to atrial fibrillation with a rapid  ventricular response. Continue Cardizem and lopressor for rate control.  6 poor dentition 7. 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 Still with some volume overload. I spoke with Dr. Tyrone Sage and plan would be to have him get his teeth extracted as an outpatient and then have his VSD fixed. +/- 1vCABG and Maze in a few weeks.  Will transition heparin to apixaban, continue diuresis and have cardiac rehab see. Hopefully we can get him home by Monday.  Daniel Bensimhon 11/23/2012, 11:00 AM

## 2012-11-23 NOTE — Progress Notes (Signed)
Report called to Peggy RN [phone]; preparing pt for transfer to floor with telemetry

## 2012-11-24 LAB — BASIC METABOLIC PANEL
GFR calc Af Amer: >90 mL/min (ref >90)
GFR calc non Af Amer: 89 mL/min — ABNORMAL LOW (ref >90)
Potassium: 3.7 mEq/L (ref 3.5–5.1)
Sodium: 139 mEq/L (ref 135–145)

## 2012-11-24 LAB — GLUCOSE, CAPILLARY: Glucose-Capillary: 179 mg/dL — ABNORMAL HIGH (ref 70–99)

## 2012-11-24 LAB — CBC
MCHC: 32.4 g/dL (ref 30.0–36.0)
RDW: 12.4 % (ref 11.5–15.5)

## 2012-11-24 MED ORDER — POTASSIUM CHLORIDE CRYS ER 20 MEQ PO TBCR
40.0000 meq | EXTENDED_RELEASE_TABLET | Freq: Once | ORAL | Status: AC
  Administered 2012-11-24: 40 meq via ORAL
  Filled 2012-11-24: qty 2

## 2012-11-24 NOTE — Progress Notes (Signed)
ANTICOAGULATION CONSULT NOTE - Initial Consult  Pharmacy Consult for Eliquis Indication: Afib  Allergies  Allergen Reactions  . Biaxin (Clarithromycin) Nausea Only    Patient Measurements: Height: 6' (182.9 cm) Weight: 224 lb 1.6 oz (101.651 kg) (scale c) IBW/kg (Calculated) : 77.6   Vital Signs: Temp: 98 F (36.7 C) (06/22 0537) Temp src: Oral (06/22 0537) BP: 104/62 mmHg (06/22 0537) Pulse Rate: 81 (06/22 0537)  Labs:  Recent Labs  11/22/12 0500 11/23/12 0725 11/24/12 0415  HGB 13.6 13.9 13.4  HCT 40.3 41.8 41.3  PLT 224 216 223  HEPARINUNFRC 0.54 0.48  --   CREATININE 0.70  --  0.86    Estimated Creatinine Clearance: 105.6 ml/min (by C-G formula based on Cr of 0.86).   Medical History: Past Medical History  Diagnosis Date  . Hypertension   . Shortness of breath   . Sleep apnea   . Diabetes mellitus without complication   . Heart murmur   . GERD (gastroesophageal reflux disease)   . Hyperlipidemia   . Tachycardia, paroxysmal May 2013    Possible SVT    Assessment: 21 YOM admitted with chest pain and Afib. S/p cath and transitioned from heparin to apixaban. He does not qualify for any of the parameters (age, weight, SCr) for a reduced dose of apixaban. His H/H/Plts are stable and all WNL. No bleeding noted.   Goal of Therapy:  Monitor platelets by anticoagulation protocol: Yes   Plan:  1. Continue apixaban 5mg  BID 2. Will educate patient on medication 3. Pharmacy will sign off and monitor peripherally as there is no indication patient's dosing needs will change  Dane Bloch D. Afiya Ferrebee, PharmD Clinical Pharmacist Pager: [phone] 11/24/2012 9:16 AM

## 2012-11-24 NOTE — Progress Notes (Signed)
   Subjective:   Feels very good. Ambulating independently. Denies CP or dyspnea. Continues to diurese briskly.    Objective:  Filed Vitals:   11/23/12 2041 11/23/12 2355 11/24/12 0537 11/24/12 0939  BP: 147/75 138/76 104/62 145/71  Pulse: 76 72 81 86  Temp: 97.8 F (36.6 C)  98 F (36.7 C) 98 F (36.7 C)  TempSrc: Oral  Oral Oral  Resp: 18 16 16 20   Height:      Weight:   101.651 kg (224 lb 1.6 oz)   SpO2: 97% 96% 96% 97%    Intake/Output from previous day:  Intake/Output Summary (Last 24 hours) at 11/24/12 1020 Last data filed at 11/24/12 7829  Gross per 24 hour  Intake   1595 ml  Output   5300 ml  Net  -3705 ml    Physical Exam: Physical exam: Well-developed well-nourished in no acute distress.  Skin is warm and dry.  HEENT is normal.  Neck is supple. JVP 8 Chest is clear to auscultation with normal expansion.  Cardiovascular exam is irregular, 3/6 systolic murmur LSB Abdominal exam nontender or distended. No masses palpated. Extremities 1-2+ ankle edema. bilaterally neuro grossly intact    Lab Results: Basic Metabolic Panel:  Recent Labs  56/21/30 0500 11/24/12 0415  NA 139 139  K 3.8 3.7  CL 105 103  CO2 26 30  GLUCOSE 161* 121*  BUN 12 12  CREATININE 0.70 0.86  CALCIUM 8.7 9.1   CBC:  Recent Labs  11/23/12 0725 11/24/12 0415  WBC 6.2 5.8  HGB 13.9 13.4  HCT 41.8 41.3  MCV 95.4 95.4  PLT 216 223     Assessment/Plan:  1 atrial fibrillation-the patient remains in atrial fibrillation; rate controlled. Continue Cardizem and lopressor for rate control. Note TSH normal. Ejection fraction 50%. 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 Right side mildly enlarged. Cath suggests signifcant shunt based on QP/QS. RAE/RVE most likely from VSD and contributing to atrial fibrillation. Will ask CVTS to evaluate for VSD closure; may also need Maze. 3 CAD - continue  ASA; on b-blocker and statin. 4 diabetes mellitus-follow CBG 5 mild cardiomyopathy-likely related to atrial fibrillation with a rapid ventricular response. Continue Cardizem and lopressor for rate control.  6 poor dentition 7. Acute diastolic HF R>L (in setting of VSD)  Summary:  Looks very good. HR much improved (65-100 on tele). Continue current regimen. Now on Apixaban.   Volume overload much improved. Will continue IV lasix today and switch to lasix 40mg  daily tomorrow. Supp K+   I spoke with Dr. Tyrone Sage and plan would be to have him get his teeth extracted as an outpatient and then have his VSD fixed. +/- 1vCABG and Maze in a few weeks. We will need to arrange outpatient f/u with Dr. Kristin Bruins on dentis in [place]. Can stop Apixaban 1 day before teeth extraction.    Hopefully we can get him home tomorrow. Will need close outpatient f/u to adjust diuretic regimen as needed.v  Arvilla Meres 11/24/2012, 10:20 AM

## 2012-11-25 ENCOUNTER — Encounter (HOSPITAL_COMMUNITY): Payer: Self-pay | Admitting: Internal Medicine

## 2012-11-25 LAB — BASIC METABOLIC PANEL
BUN: 13 mg/dL (ref 6–23)
CO2: 32 mEq/L (ref 19–32)
GFR calc non Af Amer: 85 mL/min — ABNORMAL LOW (ref >90)
Glucose, Bld: 146 mg/dL — ABNORMAL HIGH (ref 70–99)
Potassium: 4.1 mEq/L (ref 3.5–5.1)

## 2012-11-25 LAB — GLUCOSE, CAPILLARY: Glucose-Capillary: 148 mg/dL — ABNORMAL HIGH (ref 70–99)

## 2012-11-25 LAB — MAGNESIUM: Magnesium: 1.9 mg/dL (ref 1.5–2.5)

## 2012-11-25 LAB — CBC
HCT: 42.4 % (ref 39.0–52.0)
Hemoglobin: 14 g/dL (ref 13.0–17.0)
MCH: 31.3 pg (ref 26.0–34.0)
MCHC: 33 g/dL (ref 30.0–36.0)

## 2012-11-25 MED ORDER — INSULIN PEN NEEDLE 31G X 6 MM MISC: 18.0000 [IU] | Freq: Every day | Status: DC

## 2012-11-25 MED ORDER — POTASSIUM CHLORIDE CRYS ER 20 MEQ PO TBCR
20.0000 meq | EXTENDED_RELEASE_TABLET | Freq: Every day | ORAL | Status: DC
  Administered 2012-11-25: 20 meq via ORAL

## 2012-11-25 MED ORDER — BLOOD GLUCOSE TEST VI STRP: 1.0000 | ORAL_STRIP | Freq: Two times a day (BID) | Status: AC

## 2012-11-25 MED ORDER — FUROSEMIDE 40 MG PO TABS
40.0000 mg | ORAL_TABLET | Freq: Every day | ORAL | Status: DC
  Administered 2012-11-25: 40 mg via ORAL
  Filled 2012-11-25: qty 1

## 2012-11-25 MED ORDER — POTASSIUM CHLORIDE CRYS ER 20 MEQ PO TBCR: 20.0000 meq | EXTENDED_RELEASE_TABLET | Freq: Every day | ORAL | Status: DC

## 2012-11-25 MED ORDER — APIXABAN 5 MG PO TABS: 5.0000 mg | ORAL_TABLET | Freq: Two times a day (BID) | ORAL | Status: DC

## 2012-11-25 MED ORDER — FUROSEMIDE 40 MG PO TABS: 40.0000 mg | ORAL_TABLET | Freq: Every day | ORAL | Status: DC

## 2012-11-25 MED ORDER — INSULIN PEN STARTER KIT
1.0000 | Freq: Once | Status: AC
  Administered 2012-11-25: 1
  Filled 2012-11-25: qty 1

## 2012-11-25 MED ORDER — DILTIAZEM HCL ER COATED BEADS 360 MG PO CP24: 360.0000 mg | ORAL_CAPSULE | Freq: Every day | ORAL | Status: DC

## 2012-11-25 MED ORDER — INSULIN GLARGINE 100 UNIT/ML ~~LOC~~ SOLN: 18.0000 [IU] | Freq: Every day | SUBCUTANEOUS | Status: DC

## 2012-11-25 MED ORDER — INSULIN GLARGINE 100 UNITS/ML SOLOSTAR PEN: 18.0000 [IU] | PEN_INJECTOR | Freq: Every day | SUBCUTANEOUS | Status: DC

## 2012-11-25 MED ORDER — METOPROLOL TARTRATE 50 MG PO TABS: 75.0000 mg | ORAL_TABLET | Freq: Two times a day (BID) | ORAL | Status: DC

## 2012-11-25 NOTE — Discharge Summary (Signed)
CARDIOLOGY DISCHARGE SUMMARY   Patient ID: Aaron Jones MRN: [id number] DOB/AGE: [birth year] 67 y.o.  Admit date: 11/17/2012 Discharge date: 11/25/2012  Primary Discharge Diagnosis:   Atrial fibrillation with rapid ventricular response Secondary Discharge Diagnosis:    Essential hypertension, benign   Type II or unspecified type diabetes mellitus without mention of complication, not stated as uncontrolled   Dyslipidemia   Hypokalemia   Edema of left lower extremity   Mild difuse Hypokinesis   Ventricular septal defect (VSD), membranous   Mild Pulmonary HTN   Acute diastolic heart failure - discharge weight 217 pounds  Consults: TCTS., dental medicine,  Cardiology  Procedures performed:  1) Right heart cathererization  2) Selective coronary angiography  3) Left heart catheterization  4) Left ventriculogram  5) Shunt run 6) TEE 7) lower extremity venous Dopplers 8) 2-D echocardiogram 9) orthopantogram  Hospital Course: Aaron Jones is a 67 y.o. male with no history of CAD. He came to the hospital with chest pain or shortness of breath. He was also having palpitations. He went to his primary care physician. He was in rapid atrial fibrillation and a chest x-ray showed pulmonary edema. He was started on IV Cardizem and sent to [facility] for further evaluation and treatment.  He was initially admitted by internal medicine. Cardiology consult was called to help manage him. A 2-D echocardiogram was performed and showed a possible VSD with mild pulmonary hypertension. His CHAD2DS2-VASc is 3 so anticoagulation is indicated. A discussion with help with the patient regarding this and he was started on apixaban. He is tolerating this well. His rate was initially controlled with IV Cardizem. He had mild pulmonary hypertension and was gently diuresed. Internal medicine was managing his diabetes as his hemoglobin A1c was elevated. He had lower extremity Dopplers performed because his d-dimer  was elevated but they were negative for DVT.  It was felt that he needed a cardiac catheterization and he was stable for the procedure on 11/20/2012. Full results are below but an initial trial of medical therapy was recommended. His VSD was paraplegic hemodynamically significant so TEE was recommended and scheduled.  His cardiology issues seem to be primary so cardiology accept the patient on their service. His heart rate control improved with Cardizem which was changed to oral medication and Lopressor. After the TEE, a surgical consult was called. He was seen by Dr. Tyrone Sage. Because of his poor dentition, need for anticoagulation and risk of endocarditis, until consult was called. Dr. Tyrone Sage recommended deferring further surgical discussions until his dental issues have resolved. A dental consult was called.  He was seen by Dr. Kristin Bruins and then orthopantogram was performed. Dr. Dalene Carrow reviewed all the data and felt that the options were complete dental extraction followed by full dentures versus extraction of retained root segments as indicated plus gross debridement of remaining dentition. Both procedures would be performed in the OR. He would need close to followup if he chose to selective treatment option. The patient wished to discuss the situation with his wife. He will see Dr. Kristin Bruins in followup.  Other procedures, the apixaban was held and he was transitioned to heparin. After this decision was made to defer further workup to the outpatient setting, he was put back on apixaban. He was seen in consult by diabetes management and is to followup with him as an outpatient. His final status was managed closely and he was diuresed after the procedures. By 11/24/2012, he was stable for transitioned back to oral Lasix. A  11/25/2012, his weight was 217 pounds.  He is maintained on telemetry and continued to have runs of wide-complex tachycardia. He was asymptomatic with these and the exact etiology  is unclear. He is to continue his beta blocker.  On 11/25/2012, he was seen by Dr. Jens Som and by cardiac rehabilitation. His volume status was felt appropriate to continue with the oral Lasix. Cardiac rehabilitation educated the patient on lifestyle modifications for coronary artery disease, CHF and preparation for heart surgery. Dr. Jens Som considered Aaron Jones stable for discharge, to follow up as an outpatient.  Labs:   Lab Results  Component Value Date   WBC 6.3 11/25/2012   HGB 14.0 11/25/2012   HCT 42.4 11/25/2012   MCV 94.9 11/25/2012   PLT 242 11/25/2012     Recent Labs Lab 11/25/12 0515  NA 140  K 4.1  CL 103  CO2 32  BUN 13  CREATININE 0.95  CALCIUM 9.1  GLUCOSE 146*   Lab Results  Component Value Date   TROPONINI <0.30 11/18/2012   Lab Results  Component Value Date   HGBA1C 10.6* 11/17/2012    Radiology: Dg Orthopantogram 11/22/2012   *RADIOLOGY REPORT*  Clinical Data: Dental disease, preop cardiac surgery.  ORTHOPANTOGRAM/PANORAMIC  Comparison: None.  Findings: Multiple missing teeth and restorations.  Caries involves teeth numbers 25, 26, and 31.  No periapical lucency to suggest abscess. Mandible intact.  Temporomandibular joints appear seated.  IMPRESSION:  Dental restorations, missing teeth, and caries.  Recommend dental films for complete evaluation.   Original Report Authenticated By: D. Andria Rhein, MD   X-ray Chest Pa And Lateral  11/18/2012   *RADIOLOGY REPORT*  Clinical Data: Short of breath  CHEST - 2 VIEW  Comparison: None.  Findings: Heart silhouette is enlarged.  There is central venous pulmonary congestion.  Small bilateral pleural effusions.  No focal infiltrate.  No overt pulmonary edema. No acute osseous abnormality.  IMPRESSION: Cardiomegaly, central venous congestion, and small effusions.   Original Report Authenticated By: Genevive Bi, M.D.    Cardiac Cath: 11/20/2012 RA = 19  RV = 45/15/18  PA = 48/24 (34)  PCW = 22 (v = 35)  Fick  cardiac output/index = 4.9/2.2  PVR = 1.2 Woods  SVR = 1040  FA sat = 97%  SVC sat = 62%  RA sat = 64%  RV sat = 78%  PA sat = 81%  Qp/QS = 2.1  Ao Pressure: 116/69 (86)  LV Pressure: 106/13/18  There was no signficant gradient across the aortic valve on pullback.  Left main: Normal  LAD: Mild plaque in proximal portion  Ramus: Very large branching vessel. Normal  LCX: Small vessel. normal  RCA: Dominant vessel. 20% mid. 80-90% hazy lesion in ostium of small to moderate -sized PDA (2.25 mm)  LV-gram done in the RAO projection: Ejection fraction = 45% global HK  Assessment:  1. 1v CAD as described above  2. Significantly elevated R-sided filling pressure  3. VSD with evidence of moderate L -> R shunt by Qp/QS ratio  4. EF 45%  Plan/Discussion:  Based on cath numbers VSD appears hemodynamically significant. Will arrange TEE.  He has significant lesion in small-to-moderate sized PDA. CP occurred during rapid AF but uncommon with exertion. I reviewed with Dr. Clifton James lesion is approachable percutaneously but given lack of significant exertional CP and possible need for CT surgery for VSD will treat medically for now.   EKG: Atrial fibrillation with rapid ventricular response, bifascicular block  TEE:  11/21/2012 LEFT VENTRICLE: EF = 50-55%. No regional wall motion abnormalities  INTRAVENTRICULAR SEPTUM: Small to moderate sized perimembranous VSD immediately under AoV with prominent L->R shunting  RIGHT VENTRICLE: Mildly dilated. Mild HK  LEFT ATRIUM: Dilated  LEFT ATRIAL APPENDAGE: No thrombus.  RIGHT ATRIUM: Dilated  AORTIC VALVE: Trileaflet. Mild AI.  MITRAL VALVE: Normal. Mild to moderate MR  TRICUSPID VALVE: Dilated annulus moderate TR  PULMONIC VALVE: Grossly normal. Mild PR  INTERATRIAL SEPTUM: No PFO or ASD.  PERICARDIUM: No effusion  DESCENDING AORTA: Mild plaque  Echo:  11/18/2012 Study Conclusions - Left ventricle: The cavity size was mildly dilated.  Wall thickness was increased in a pattern of mild LVH. The estimated ejection fraction was 50%. Mild diffuse hypokinesis. Indeterminant diastolic function (atrial fibrillation). - Ventricular septum: Suspect there is a small perimembranous VSD. - Aortic valve: There was no stenosis. Mild regurgitation. - Mitral valve: Trivial regurgitation. - Left atrium: The atrium was mildly dilated. - Right ventricle: The cavity size was mildly dilated. Systolic function was normal. - Right atrium: The atrium was moderately dilated. - Tricuspid valve: Peak RV-RA gradient: 29mm Hg (S). - Pulmonary arteries: PA systolic pressure 40-44 mmHg. - Systemic veins: IVC measured 2.4 cm with < 50% respirophasic variation, suggesting RA pressure 11-15 mmHg. Impressions: - The patient appeared to be in atrial fibrillation. The LV was mildly dilated with mild diffuse hypokinesis, EF 50%. There appeared to be a perimembranous VSD. The RV was mildly dilated with normla systolic function. There was mild pulmonary hypertension. Would consider TEE for closer evaluation of VSD.  FOLLOW UP PLANS AND APPOINTMENTS Allergies  Allergen Reactions  . Biaxin (Clarithromycin) Nausea Only     Medication List    STOP taking these medications       diltiazem 120 MG 24 hr tablet  Commonly known as:  CARDIZEM LA     lisinopril 20 MG tablet  Commonly known as:  PRINIVIL,ZESTRIL     metoprolol succinate 100 MG 24 hr tablet  Commonly known as:  TOPROL-XL     pioglitazone 30 MG tablet  Commonly known as:  ACTOS      TAKE these medications       acetaminophen 500 MG tablet  Commonly known as:  TYLENOL  Take 500 mg by mouth every 6 (six) hours as needed for pain.     apixaban 5 MG Tabs tablet  Commonly known as:  ELIQUIS  Take 1 tablet (5 mg total) by mouth 2 (two) times daily.     aspirin EC 81 MG tablet  Take 81 mg by mouth daily.     atorvastatin 20 MG tablet  Commonly known as:  LIPITOR  Take 20 mg by  mouth at bedtime.     BLOOD GLUCOSE TEST STRIPS Strp  1 strip by In Vitro route 2 (two) times daily.     diltiazem 360 MG 24 hr capsule  Commonly known as:  CARDIZEM CD  Take 1 capsule (360 mg total) by mouth daily.     furosemide 40 MG tablet  Commonly known as:  LASIX  Take 1 tablet (40 mg total) by mouth daily.     insulin glargine 100 units/mL Soln  Commonly known as:  LANTUS  Inject 18 Units into the skin daily at 10 pm.     Insulin Pen Needle 31G X 6 MM Misc  18 Units by Does not apply route at bedtime.     metFORMIN 500 MG 24 hr tablet  Commonly known as:  GLUCOPHAGE-XR  Take 1,000 mg by mouth 2 (two) times daily.     metoprolol 50 MG tablet  Commonly known as:  LOPRESSOR  Take 1.5 tablets (75 mg total) by mouth 2 (two) times daily.     omeprazole 40 MG capsule  Commonly known as:  PRILOSEC  Take 40 mg by mouth daily.     potassium chloride SA 20 MEQ tablet  Commonly known as:  K-DUR,KLOR-CON  Take 1 tablet (20 mEq total) by mouth daily.        Discharge Orders   Future Appointments Provider Department Dept Phone   12/05/2012 4:15 PM Kevan Mellendick, RD [facility] [phone]   12/30/2012 10:45 AM Lewayne Bunting, MD [facility] Main Office [place]) [phone]   Future Orders Complete By Expires     (HEART FAILURE PATIENTS) Call MD:  Anytime you have any of the following symptoms: 1) 3 pound weight gain in 24 hours or 5 pounds in 1 week 2) shortness of breath, with or without a dry hacking cough 3) swelling in the hands, feet or stomach 4) if you have to sleep on extra pillows at night in order to breathe.  As directed     Ambulatory referral to Nutrition and Diabetic Education  As directed     Diet - low sodium heart healthy  As directed     Diet Carb Modified  As directed     Increase activity slowly  As directed       Follow-up Information   Follow up with Mellendick, Kevan, RD On 12/05/2012. (at 4:15 pm)     Contact information:   [facility]  [address] [place] [address] [phone]       Follow up with Charlynne Pander, DDS On 12/04/2012. (Follow-up visit at 9:00 am)    Contact information:   [address] [place] [address] [phone]       Follow up with Olga Millers, MD On 12/30/2012. (at 10:45 am)    Contact information:   [address] [place], [place] [address]                         [address] [place] [address] [phone]       Follow up with Delight Ovens, MD. (The office will call.)    Contact information:   [address] [place] [address] [phone]       BRING ALL MEDICATIONS WITH YOU TO FOLLOW UP APPOINTMENTS  Time spent with patient to include physician time: 49 min Signed: Theodore Demark, PA-C 11/25/2012, 11:16 AM Co-Sign MD

## 2012-11-25 NOTE — Progress Notes (Signed)
[name] PA returned call, no new orders received at this time. Awaiting results of morning labs.  Will continue to monitor.  Aaron Jones

## 2012-11-25 NOTE — Progress Notes (Signed)
Inpatient Diabetes Program Recommendations  AACE/ADA: New Consensus Statement on Inpatient Glycemic Control (2013)  Target Ranges:  Prepandial:   less than 140 mg/dL      Peak postprandial:   less than 180 mg/dL (1-2 hours)      Critically ill patients:  140 - 180 mg/dL     Patient to be discharged home on Lantus insulin.  Patient would prefer insulin pens for home.  Educated patient on insulin pen use at home.  Reviewed contents of insulin flexpen starter kit.  Reviewed all steps of insulin pen including attachment of needle, 2-unit air shot, dialing up dose, giving injection, removing needle, disposal of sharps, storage of unused insulin, disposal of insulin etc.  Patient able to provide successful return demonstration.  Also reviewed troubleshooting with insulin pen.  MD to give patient Rxs for insulin pens and insulin pen needles.  Will follow. Ambrose Finland RN, MSN, CDE Diabetes Coordinator Inpatient Diabetes Program [phone]

## 2012-11-25 NOTE — Progress Notes (Signed)
CARDIAC REHAB PHASE I   PRE:  Rate/Rhythm: 110 Afib  BP:  Supine:   Sitting:   Standing: 126/78   SaO2: 97 RA  MODE:  Ambulation: 760 ft   POST:  Rate/Rhythm: 90  BP:  Supine:   Sitting:   Standing: 145/69   SaO2: 96 RA 0815-0930 Pt tolerated ambulation well without c/o of cp or SOB. VS stable Completed pre- op heart surgery and CHF education with pt. He voices understanding. Gave pt CHF packet and pre-op heart surgery booklet. Put heart surgery video for pt to watch and encouraged hm to watch CHF video. We discussed signs and symptoms of CHF, daily weights, zone, low sodium diabetic diet, sternal precautions, how to get in and out of bed and chair without using arms. Using IS and walking post-op.He seems very committed to lifestyle changes and doing what it takes to staying well.  Melina Copa RN 11/25/2012 9:28 AM

## 2012-11-25 NOTE — Progress Notes (Signed)
Patient alarmed on telemetry with 14 beat run of VTach.  Patient was awake, alert and asymptomatic. Vitals obtained, strip printed and to chart, MD on call paged-awaiting call back. Will continue to monitor. Blood pressure 107/65, pulse 77, temperature 98.1 F (36.7 C), temperature source Oral, resp. rate 16, height 6' (1.829 m), weight 98.7 kg (217 lb 9.5 oz), SpO2 95.00%. Aaron Jones

## 2012-11-25 NOTE — Discharge Summary (Signed)
See progress notes Aaron Jones  

## 2012-11-25 NOTE — Progress Notes (Signed)
   Subjective:  Denies CP or dyspnea   Objective:  Filed Vitals:   11/24/12 0939 11/24/12 1443 11/24/12 2119 11/25/12 0601  BP: 145/71 117/71 137/78 107/65  Pulse: 86 69 70 77  Temp: 98 F (36.7 C) 98.5 F (36.9 C) 98.2 F (36.8 C) 98.1 F (36.7 C)  TempSrc: Oral Oral Oral Oral  Resp: 20 20 20 16   Height:      Weight:    217 lb 9.5 oz (98.7 kg)  SpO2: 97% 99% 98% 95%    Intake/Output from previous day:  Intake/Output Summary (Last 24 hours) at 11/25/12 0757 Last data filed at 11/25/12 0603  Gross per 24 hour  Intake   1680 ml  Output   4875 ml  Net  -3195 ml    Physical Exam: Physical exam: Well-developed well-nourished in no acute distress.  Skin is warm and dry.  HEENT is normal.  Neck is supple.  Chest is clear to auscultation with normal expansion.  Cardiovascular exam is irregular, 3/6 systolic murmur LSB Abdominal exam nontender or distended. No masses palpated. Extremities show no edema. neuro grossly intact    Lab Results: Basic Metabolic Panel:  Recent Labs  16/10/96 0415 11/25/12 0500 11/25/12 0515  NA 139  --  140  K 3.7  --  4.1  CL 103  --  103  CO2 30  --  32  GLUCOSE 121*  --  146*  BUN 12  --  13  CREATININE 0.86  --  0.95  CALCIUM 9.1  --  9.1  MG  --  1.9  --    CBC:  Recent Labs  11/24/12 0415 11/25/12 0515  WBC 5.8 6.3  HGB 13.4 14.0  HCT 41.3 42.4  MCV 95.4 94.9  PLT 223 242     Assessment/Plan:  1 atrial fibrillation-the patient remains in atrial fibrillation; rate controlled. Continue Cardizem and lopressor for rate control. Continue apixaban. Note TSH normal. Ejection fraction 50%. 2 ventricular septal defect-TEE revealed perimembranous VSD with R to L shunting. Right side mildly enlarged. Cath suggests signifcant shunt based on QP/QS. RAE/RVE most likely from VSD and contributing to atrial fibrillation. Seen by Dr Tyrone Sage and surgery planned (VSD closure, Maze, CABG) once dental extractions complete. 3 CAD -  continue ASA and statin. 4 diabetes mellitus 5 mild cardiomyopathy-possibly related to atrial fibrillation with a rapid ventricular response. Continue Cardizem and lopressor for rate control.  6 Poor dentition. Plan would be to have him get his teeth extracted as an outpatient (needs FU appt with Dr Kristin Bruins) and then have his VSD fixed +/- 1v CABG and Maze in a few weeks. Can stop Apixaban 1 day before teeth extraction. Will need to discuss fu with Dr Tyrone Sage as well. FU with me in 8 weeks. DC on present meds except change lasix to 40 mg daily and KCL 20 meq daily; check BMET 1 week. > 30 min PA and physician time D2  Olga Millers 11/25/2012, 7:57 AM

## 2012-11-27 ENCOUNTER — Other Ambulatory Visit: Payer: Self-pay | Admitting: *Deleted

## 2012-11-27 ENCOUNTER — Telehealth: Payer: Self-pay | Admitting: Cardiology

## 2012-11-27 ENCOUNTER — Encounter: Payer: Self-pay | Admitting: *Deleted

## 2012-11-27 ENCOUNTER — Other Ambulatory Visit (INDEPENDENT_AMBULATORY_CARE_PROVIDER_SITE_OTHER): Payer: Managed Care, Other (non HMO)

## 2012-11-27 DIAGNOSIS — E785 Hyperlipidemia, unspecified: Secondary | ICD-10-CM

## 2012-11-27 LAB — BASIC METABOLIC PANEL
BUN: 18 mg/dL (ref 6–23)
CO2: 26 mEq/L (ref 19–32)
Calcium: 9.6 mg/dL (ref 8.4–10.5)
Creatinine, Ser: 1.2 mg/dL (ref 0.4–1.5)
GFR: 64.54 mL/min (ref >60.00)
Glucose, Bld: 105 mg/dL — ABNORMAL HIGH (ref 70–99)
Sodium: 135 mEq/L (ref 135–145)

## 2012-11-27 NOTE — Telephone Encounter (Signed)
New Problem  Pt wants to get a letter from Dr Jens Som for his job. He said Dr Jens Som took him out of work.

## 2012-11-27 NOTE — Telephone Encounter (Signed)
Spoke with pt, he needs a letter from when he was admitted to the hosp until his follow up appt. Letter placed at the front desk for pick up.

## 2012-11-27 NOTE — Telephone Encounter (Signed)
Left message for pt to call.

## 2012-12-02 ENCOUNTER — Telehealth: Payer: Self-pay | Admitting: Cardiology

## 2012-12-02 NOTE — Telephone Encounter (Signed)
Spoke with pt, he went out to eat several times yesterday and ate pork chop and bacon. He has a tiny bit of swelling and denies SOB. He will cont to watch, cautioned pt about the sodium content of foods. He will call back if symptoms change or do not improve by the end of the week. Pt agreed with this plan.

## 2012-12-02 NOTE — Telephone Encounter (Addendum)
New Problem  Pt weighted 211 on Sunday and he weighs 214. 4 today.  He said he was advised to notify the doctor when he gains more than 3 lbs.

## 2012-12-03 ENCOUNTER — Encounter: Payer: Self-pay | Admitting: Cardiothoracic Surgery

## 2012-12-03 ENCOUNTER — Ambulatory Visit (INDEPENDENT_AMBULATORY_CARE_PROVIDER_SITE_OTHER): Payer: Managed Care, Other (non HMO) | Admitting: Cardiothoracic Surgery

## 2012-12-03 VITALS — BP 129/76 | HR 80 | Resp 20 | Ht 72.0 in | Wt 217.0 lb

## 2012-12-03 DIAGNOSIS — Q210 Ventricular septal defect: Secondary | ICD-10-CM

## 2012-12-03 DIAGNOSIS — I4891 Unspecified atrial fibrillation: Secondary | ICD-10-CM

## 2012-12-03 DIAGNOSIS — M2607 Excessive tuberosity of jaw: Secondary | ICD-10-CM

## 2012-12-03 DIAGNOSIS — I2510 Atherosclerotic heart disease of native coronary artery without angina pectoris: Secondary | ICD-10-CM

## 2012-12-03 DIAGNOSIS — K036 Deposits [accretions] on teeth: Secondary | ICD-10-CM

## 2012-12-03 DIAGNOSIS — K0530 Chronic periodontitis, unspecified: Secondary | ICD-10-CM

## 2012-12-03 DIAGNOSIS — K029 Dental caries, unspecified: Secondary | ICD-10-CM

## 2012-12-04 ENCOUNTER — Ambulatory Visit (HOSPITAL_COMMUNITY): Payer: Self-pay | Admitting: Dentistry

## 2012-12-04 ENCOUNTER — Encounter (HOSPITAL_COMMUNITY): Payer: Self-pay | Admitting: Dentistry

## 2012-12-04 VITALS — BP 133/72 | HR 80 | Temp 98.4°F

## 2012-12-04 DIAGNOSIS — Q210 Ventricular septal defect: Secondary | ICD-10-CM

## 2012-12-04 DIAGNOSIS — K0530 Chronic periodontitis, unspecified: Secondary | ICD-10-CM

## 2012-12-04 DIAGNOSIS — K083 Retained dental root: Secondary | ICD-10-CM

## 2012-12-04 DIAGNOSIS — Z0189 Encounter for other specified special examinations: Secondary | ICD-10-CM

## 2012-12-04 DIAGNOSIS — K036 Deposits [accretions] on teeth: Secondary | ICD-10-CM

## 2012-12-04 DIAGNOSIS — M264 Malocclusion, unspecified: Secondary | ICD-10-CM

## 2012-12-04 DIAGNOSIS — K029 Dental caries, unspecified: Secondary | ICD-10-CM

## 2012-12-04 DIAGNOSIS — K045 Chronic apical periodontitis: Secondary | ICD-10-CM

## 2012-12-04 DIAGNOSIS — K08409 Partial loss of teeth, unspecified cause, unspecified class: Secondary | ICD-10-CM

## 2012-12-04 DIAGNOSIS — K0889 Other specified disorders of teeth and supporting structures: Secondary | ICD-10-CM

## 2012-12-04 NOTE — Patient Instructions (Signed)
The patient will be contacted once oral surgery is scheduled. Patient will be held off of his coagulant medication as indicated per instructions from Dr. Jens Som. Dr. Kristin Bruins

## 2012-12-04 NOTE — Progress Notes (Signed)
12/04/2012  Patient:            Aaron Jones Date of Birth:  May 25, 1946 MRN:                [id number]  BP 133/72  Pulse 80  Temp(Src) 98.4 F (36.9 C) (Oral)   Aaron Jones is a 67 year old male that presents for periodic oral exam, full series of dental radiographs, and discussion of treatment options prior to anticipated ventricular septal defect repair heart surgery with Dr. Tyrone Sage.  Patient Active Problem List   Diagnosis Date Noted  . Acute diastolic heart failure 11/23/2012  . Hypokalemia 11/18/2012  . Edema of left lower extremity 11/18/2012  . Mild difuse Hypokinesis 11/18/2012  . Ventricular septal defect (VSD), membranous 11/18/2012  . Mild Pulmonary HTN 11/18/2012  . Atrial fibrillation with rapid ventricular response 11/17/2012  . Essential hypertension, benign 11/17/2012  . Type II or unspecified type diabetes mellitus without mention of complication, not stated as uncontrolled 11/17/2012  . Dyslipidemia 11/17/2012  . Heart murmur 11/17/2012   Medical Hx Update:  Past Medical History  Diagnosis Date  . Hypertension   . Shortness of breath   . Sleep apnea   . Diabetes mellitus without complication   . Heart murmur   . GERD (gastroesophageal reflux disease)   . Hyperlipidemia   . Tachycardia, paroxysmal May 2013    Possible SVT  . ALLERGIES/ADVERSE DRUG REACTIONS: Allergies  Allergen Reactions  . Biaxin (Clarithromycin) Nausea Only   MEDICATIONS: Current Outpatient Prescriptions  Medication Sig Dispense Refill  . acetaminophen (TYLENOL) 500 MG tablet Take 500 mg by mouth every 6 (six) hours as needed for pain.      Marland Kitchen apixaban (ELIQUIS) 5 MG TABS tablet Take 1 tablet (5 mg total) by mouth 2 (two) times daily.  60 tablet  11  . aspirin EC 81 MG tablet Take 81 mg by mouth daily.      Marland Kitchen atorvastatin (LIPITOR) 20 MG tablet Take 20 mg by mouth at bedtime.      Marland Kitchen diltiazem (CARDIZEM CD) 360 MG 24 hr capsule Take 1 capsule (360 mg total) by mouth daily.  30  capsule  11  . furosemide (LASIX) 40 MG tablet Take 1 tablet (40 mg total) by mouth daily.  30 tablet  11  . Glucose Blood (BLOOD GLUCOSE TEST STRIPS) STRP 1 strip by In Vitro route 2 (two) times daily.  100 each  0  . insulin glargine (LANTUS) 100 units/mL SOLN Inject 18 Units into the skin daily at 10 pm.  15 mL  0  . Insulin Pen Needle 31G X 6 MM MISC 18 Units by Does not apply route at bedtime.  50 each  0  . metFORMIN (GLUCOPHAGE-XR) 500 MG 24 hr tablet Take 1,000 mg by mouth 2 (two) times daily.      . metoprolol tartrate (LOPRESSOR) 50 MG tablet Take 1.5 tablets (75 mg total) by mouth 2 (two) times daily.  90 tablet  11  . omeprazole (PRILOSEC) 40 MG capsule Take 40 mg by mouth daily.      . potassium chloride SA (K-DUR,KLOR-CON) 20 MEQ tablet Take 1 tablet (20 mEq total) by mouth daily.  30 tablet  11   No current facility-administered medications for this visit.    DENTAL EXAM: General: Patient is a well-developed, well-nourished male in no acute distress. Vitals: BP 133/72  Pulse 80  Temp(Src) 98.4 F (36.9 C) (Oral) Extraoral Exam: No lymphadenopathy.  No TMJ  Symptoms. Intraoral  Exam:  Normal Saliva. No abscess formation. Bilateral excessive maxillary tuberosities. Dentition: The patient is missing tooth numbers 1, 6 through 13, 16, 18, 19, 20, 28, 29, 30, and 31. Tooth #32 is present as retained root segments. Caries: Multiple dental caries as per dental charting form. Endodontic: Patient denies acute pulpitis symptoms. Patient has had previous root canal therapy associated retained root segments #32 . Crown and bridge: There are multiple crown and bridge restorations many with recurrent dental caries. Prosthodontic: History of maxillary and mandibular partial dentures. The maxillary partial denture is ill fitting. Patient no longer wears the lower partial denture. Occlusion: Patient with a poor occlusal scheme secondary to multiple missing teeth, ill fitting partial dentures,  and supra-eruption and drifting of the unopposed teeth into the edentulous areas. Radiographic Interpretation: An orthopantogram and full series of dental radiographs were obtained today. There are  multiple missing teeth. There are multiple dental caries noted. There is incipient to moderate bone loss noted. There are multiple crown and bridge restorations. Recurrent caries are noted be affecting any crowns.  There are retained root segments in the area of #32. The root segments have had previous root canal therapy now exposed to the oral environment. There is supra-eruption and drifting of the unopposed teeth into the edentulous areas.   Plan:  1. I discussed the risks, benefits, complications of various treatment options with the patient relationship to his medical and dental conditions, anticipated ventricular septal defect repair, and the risks for infection and endocarditis now in the future. We specifically addressed the risk for bleeding with invasive dental procedures with the current anticoagulant therapy. We discussed no treatment, total and subtotal extractions with alveoloplasty, pre-prosthetic surgery as indicated, periodontal therapy, dental restorations, crown or bridge therapy, implant therapy, and replacement of missing teeth is indicated after adequate healing. The patient currently wishes to proceed with extraction of all remaining teeth with alveoloplasty and pre-prosthetic surgery as indicated in the operating room with general anesthesia.  The anticoagulant therapy will be discontinued 48 hours prior to invasive dental procedures to allow for the dental extractions to occur. Anticoagulant therapy will be restarted after appropriate one of time after the dental extractions. The patient will then be scheduled for his ventricular septal defect repair surgery after appropriate healing from the dental extractions. Patient will then followup for fabrication of upper and lower complete  dentures with a primary dentist of his choice after adequate healing and once medically stable from the previous heart surgery.     2. Discussion of plan of care with medical team members as indicated     Charlynne Pander 12/04/2012

## 2012-12-05 ENCOUNTER — Encounter (HOSPITAL_COMMUNITY): Payer: Self-pay | Admitting: Pharmacy Technician

## 2012-12-05 ENCOUNTER — Encounter: Payer: Self-pay | Admitting: Dietician

## 2012-12-05 ENCOUNTER — Other Ambulatory Visit (HOSPITAL_COMMUNITY): Payer: Self-pay | Admitting: Dentistry

## 2012-12-05 ENCOUNTER — Encounter: Payer: Managed Care, Other (non HMO) | Attending: Internal Medicine | Admitting: Dietician

## 2012-12-05 VITALS — Ht 71.5 in | Wt 218.6 lb

## 2012-12-05 DIAGNOSIS — E119 Type 2 diabetes mellitus without complications: Secondary | ICD-10-CM | POA: Insufficient documentation

## 2012-12-05 DIAGNOSIS — Z713 Dietary counseling and surveillance: Secondary | ICD-10-CM | POA: Insufficient documentation

## 2012-12-05 NOTE — Progress Notes (Signed)
Medical Nutrition Therapy:  Appt start time: 1600 end time:  1700.  Assessment:  Primary concerns today: type II DM.   MEDICATIONS: see list.   DIETARY INTAKE:  Usual eating pattern includes 3 meals and 1-2 snacks per day.  Everyday foods include coffee, eggs.  Avoided foods include spicy foods, vinegar and pickles.    24-hr recall:  B ( AM): [place] sausage and eggs. Coffee with 2-3 creamers (half anf half) and 2-3 sweet n low. Used to eat a biscuit also. Snk ( AM): if at work, none  L ( PM): recently will make a sandwich with sandwich thin bread with tomatoes, mayo, glass crystal light Snk ( PM): half an apple D ( PM): pasta (4-6 cups), red sauce, cheese, salad, one bread stick, water Snk ( PM): frozen fruit pop Beverages: coffee, water, crystal light, diet pepsi, no EtOH Pt claims he is making a concerted effort to improve his eating habits since leaving hospital around father's day.  Usual physical activity: very little until recent hospitalization. Recently has been walking around neighborhood and mall- usually 1/2 to full mile each day- about 30-35 minutes most days.   Progress Towards Goal(s):  In progress.   Nutritional Diagnosis:  NI-5.8.4 Inconsistent carbohydrate intake As related to type II DM; high CHO intake at dinner time, very low CHO intake across other meals.  As evidenced by pt diet recall, HgbA1c 10.6.    Intervention:  Nutrition counseling provided regarding pathophysiology of type II DM, CHO counting, utility of physical activity in treatment of hypeglycemia. RD discussed importance of incorporating high fiber and lean protein foods to limit spikes in BG. Pt assigned a CHO controlled diet as follows: B- 3 CHO, 3 Pro, 2 fat L- 3-4 CHO, 3-4 Pro, 3 fat, 1+ veg Snack- 1 CHO D- 4 CHO, 4-5 Prot, 3 fat, 2+ veg Snack- 1 CHO  RD discussed different physical activity options he enjoys that are safe (pt states his neighborhood is not safe at night), and  encouraged pt to continue in current vein. Goal is at least 30 minutes most days. RD discussed some options to maintain high protein intake after surgeries (upcoming mouth surgery will limit food choices drastically), and importance of monitoring BG at this time and potentially seeking advice for adjustment of insulin with MD.  Handouts given during visit include:  Diabetes basics  CHO controlled diet  Monitoring/Evaluation:  Dietary intake, exercise, portion control, BG, and body weight after heart surgery.

## 2012-12-09 ENCOUNTER — Ambulatory Visit (HOSPITAL_COMMUNITY)
Admission: RE | Admit: 2012-12-09 | Discharge: 2012-12-09 | Disposition: A | Discharge status: Home / Self Care | Payer: Managed Care, Other (non HMO) | Source: Ambulatory Visit | Attending: Dentistry | Admitting: Dentistry

## 2012-12-09 ENCOUNTER — Encounter (HOSPITAL_COMMUNITY): Payer: Self-pay

## 2012-12-09 ENCOUNTER — Encounter (HOSPITAL_COMMUNITY)
Admission: RE | Admit: 2012-12-09 | Discharge: 2012-12-09 | Disposition: A | Discharge status: Home / Self Care | Payer: Managed Care, Other (non HMO) | Source: Ambulatory Visit | Attending: Dentistry | Admitting: Dentistry

## 2012-12-09 ENCOUNTER — Encounter (HOSPITAL_COMMUNITY): Payer: Self-pay | Admitting: Anesthesiology

## 2012-12-09 DIAGNOSIS — R0989 Other specified symptoms and signs involving the circulatory and respiratory systems: Secondary | ICD-10-CM | POA: Insufficient documentation

## 2012-12-09 DIAGNOSIS — I517 Cardiomegaly: Secondary | ICD-10-CM | POA: Insufficient documentation

## 2012-12-09 DIAGNOSIS — M47814 Spondylosis without myelopathy or radiculopathy, thoracic region: Secondary | ICD-10-CM | POA: Insufficient documentation

## 2012-12-09 DIAGNOSIS — Z01818 Encounter for other preprocedural examination: Secondary | ICD-10-CM | POA: Insufficient documentation

## 2012-12-09 DIAGNOSIS — Z01812 Encounter for preprocedural laboratory examination: Secondary | ICD-10-CM | POA: Insufficient documentation

## 2012-12-09 LAB — BASIC METABOLIC PANEL
Calcium: 9.5 mg/dL (ref 8.4–10.5)
GFR calc Af Amer: >90 mL/min (ref >90)
GFR calc non Af Amer: >90 mL/min (ref >90)
Glucose, Bld: 83 mg/dL (ref 70–99)
Potassium: 4.1 mEq/L (ref 3.5–5.1)
Sodium: 139 mEq/L (ref 135–145)

## 2012-12-09 LAB — CBC
Hemoglobin: 14 g/dL (ref 13.0–17.0)
MCH: 30.7 pg (ref 26.0–34.0)
MCHC: 33.2 g/dL (ref 30.0–36.0)
Platelets: 237 10*3/uL (ref 150–400)
RDW: 11.8 % (ref 11.5–15.5)

## 2012-12-09 NOTE — Anesthesia Preprocedure Evaluation (Addendum)
Anesthesia Evaluation  Patient identified by MRN, date of birth, ID band Patient awake    Reviewed: Allergy & Precautions, H&P , NPO status , Patient's Chart, lab work & pertinent test results, reviewed documented beta blocker date and time   Airway Mallampati: II TM Distance: >3 FB Neck ROM: Full    Dental no notable dental hx. (+) Dental Advisory Given   Pulmonary shortness of breath, sleep apnea ,  breath sounds clear to auscultation  Pulmonary exam normal       Cardiovascular Exercise Tolerance: Good hypertension, Pt. on medications and Pt. on home beta blockers + CAD + dysrhythmias Atrial Fibrillation + Valvular Problems/Murmurs Rhythm:Regular Rate:Normal  Discharged 11-25-12 from [facility] after atrial fibrillation with RVR, and CHF. Diagnosed with CAD, VSD and mild pulmonary hypertension. Seen most recently by Dr. Gala Romney and Dr. Jens Som. Now is preop for heart stent and CABG, but needs dental work first. No current symptoms of chest pain, nor SOB recently.    Echo 11/2012 - Left ventricle: The cavity size was mildly dilated. Wall   thickness was increased in a pattern of mild LVH. The   estimated ejection fraction was 50%. Mild diffuse   hypokinesis. Indeterminant diastolic function (atrial   fibrillation). - Ventricular septum: Suspect there is a small   perimembranous VSD. - Aortic valve: There was no stenosis. Mild regurgitation. - Mitral valve: Trivial regurgitation. - Left atrium: The atrium was mildly dilated. - Right ventricle: The cavity size was mildly dilated.   Systolic function was normal. - Right atrium: The atrium was moderately dilated. - Tricuspid valve: Peak RV-RA gradient: 29mm Hg (S). - Pulmonary arteries: PA systolic pressure 40-44 mmHg. - Systemic veins: IVC measured 2.4 cm with < 50%   respirophasic variation, suggesting RA pressure 11-15   MmHg.  Impressions:  - The patient appeared to be in  atrial fibrillation. The LV   was mildly dilated with mild diffuse hypokinesis, EF 50%.   There appeared to be a perimembranous VSD. The RV was  mildly dilated with normla systolic function. There was  mild pulmonary hypertension. Would consider TEE for closer  evaluation of VSD.    Neuro/Psych negative neurological ROS  negative psych ROS   GI/Hepatic Neg liver ROS, GERD-  Medicated,  Endo/Other  diabetes, Type 2, Oral Hypoglycemic Agents  Renal/GU negative Renal ROS     Musculoskeletal negative musculoskeletal ROS (+)   Abdominal (+) + obese,   Peds  Hematology negative hematology ROS (+)   Anesthesia Other Findings   Reproductive/Obstetrics                        Anesthesia Physical Anesthesia Plan  ASA: III  Anesthesia Plan: General   Post-op Pain Management:    Induction: Intravenous  Airway Management Planned: Nasal ETT  Additional Equipment:   Intra-op Plan:   Post-operative Plan: Extubation in OR  Informed Consent: I have reviewed the patients History and Physical, chart, labs and discussed the procedure including the risks, benefits and alternatives for the proposed anesthesia with the patient or authorized representative who has indicated his/her understanding and acceptance.   Dental advisory given  Plan Discussed with: CRNA  Anesthesia Plan Comments: (Last eliquis was 12-08-12 PM. Consider oral ETT versus nasal ETT.)       Anesthesia Quick Evaluation

## 2012-12-09 NOTE — Patient Instructions (Addendum)
20      Your procedure is scheduled on:   Wednesday 12/11/2012  Report to [facility] at   0800 AM.  Call this number if you have problems the morning of surgery: [phone]   Remember:  Take 1/2 dose of Lantus Insulin the night before surgery (9 units)!             IF YOU USE CPAP,BRING MASK AND TUBING AM OF SURGERY!   Do not eat food or drink liquids AFTER MIDNIGHT!  Take these medicines the morning of surgery with A SIP OF water:Cardizem, Metoprolol, Omeprazole  Do not bring valuables to the hospital. [facility] IS NOT RESPONSIBLE   FOR ANY BELONGINGS OR VALUABLES.  Wynelle Fanny suitcase in the car. After surgery it may be brought to your room.  For patients admitted to the hospital, checkout time is 11:00 AM the day of              Discharge.    DO NOT WEAR JEWELRY , MAKE-UP, LOTIONS,POWDERS,PERFUMES!             WOMEN -DO NOT SHAVE LEGS OR UNDERARMS 12 HRS. BEFORE  SURGERY!               MEN MAY SHAVE AS USUAL!             CONTACTS,DENTURES OR BRIDGEWORK, FALSE EYELASHES MAY  NOT BE WORN INTO SURGERY!                                           Patients discharged the day of surgery will not be allowed to drive home.If going home the same day of surgery, must have someone stay with you first 24 hrs.at home and arrange for someone to drive you home from the Hospital.                          YOUR DRIVER MV:[id number]   Special Instructions:             Please read over the following fact sheets that you were given:             1. [place] PREPARING FOR SURGERY SHEET              2.[facility]SA INFORMATION              3.[facility]                                        Telford Nab.Kameryn Tisdel,RN,BSN     [phone]                FAILURE TO FOLLOW THESE INSTRUCTIONS MAY RESULT IN  CANCELLATION OF YOUR SURGERY!               Patient Signature:___________________________

## 2012-12-11 ENCOUNTER — Encounter (HOSPITAL_COMMUNITY)
Admission: RE | Disposition: A | Discharge status: Home / Self Care | Payer: Self-pay | Source: Ambulatory Visit | Attending: Internal Medicine

## 2012-12-11 ENCOUNTER — Inpatient Hospital Stay (HOSPITAL_COMMUNITY)
Admission: RE | Admit: 2012-12-11 | Discharge: 2012-12-12 | DRG: 988 | Disposition: A | Discharge status: Home / Self Care | Payer: Managed Care, Other (non HMO) | Source: Ambulatory Visit | Attending: Internal Medicine | Admitting: Internal Medicine

## 2012-12-11 ENCOUNTER — Encounter (HOSPITAL_COMMUNITY): Payer: Self-pay | Admitting: Anesthesiology

## 2012-12-11 ENCOUNTER — Encounter (HOSPITAL_COMMUNITY): Payer: Self-pay | Admitting: *Deleted

## 2012-12-11 ENCOUNTER — Ambulatory Visit (HOSPITAL_COMMUNITY): Payer: Managed Care, Other (non HMO) | Admitting: Anesthesiology

## 2012-12-11 DIAGNOSIS — Z79899 Other long term (current) drug therapy: Secondary | ICD-10-CM

## 2012-12-11 DIAGNOSIS — K0530 Chronic periodontitis, unspecified: Secondary | ICD-10-CM

## 2012-12-11 DIAGNOSIS — E785 Hyperlipidemia, unspecified: Secondary | ICD-10-CM | POA: Diagnosis present

## 2012-12-11 DIAGNOSIS — I509 Heart failure, unspecified: Secondary | ICD-10-CM | POA: Diagnosis present

## 2012-12-11 DIAGNOSIS — I2789 Other specified pulmonary heart diseases: Secondary | ICD-10-CM | POA: Diagnosis present

## 2012-12-11 DIAGNOSIS — E7800 Pure hypercholesterolemia, unspecified: Secondary | ICD-10-CM | POA: Diagnosis present

## 2012-12-11 DIAGNOSIS — I4891 Unspecified atrial fibrillation: Principal | ICD-10-CM

## 2012-12-11 DIAGNOSIS — G4730 Sleep apnea, unspecified: Secondary | ICD-10-CM | POA: Diagnosis present

## 2012-12-11 DIAGNOSIS — I272 Pulmonary hypertension, unspecified: Secondary | ICD-10-CM | POA: Diagnosis present

## 2012-12-11 DIAGNOSIS — K029 Dental caries, unspecified: Secondary | ICD-10-CM | POA: Diagnosis present

## 2012-12-11 DIAGNOSIS — M2607 Excessive tuberosity of jaw: Secondary | ICD-10-CM | POA: Diagnosis present

## 2012-12-11 DIAGNOSIS — M949 Disorder of cartilage, unspecified: Secondary | ICD-10-CM | POA: Diagnosis present

## 2012-12-11 DIAGNOSIS — M899 Disorder of bone, unspecified: Secondary | ICD-10-CM | POA: Diagnosis present

## 2012-12-11 DIAGNOSIS — I428 Other cardiomyopathies: Secondary | ICD-10-CM | POA: Diagnosis present

## 2012-12-11 DIAGNOSIS — E119 Type 2 diabetes mellitus without complications: Secondary | ICD-10-CM

## 2012-12-11 DIAGNOSIS — Q210 Ventricular septal defect: Secondary | ICD-10-CM

## 2012-12-11 DIAGNOSIS — Z794 Long term (current) use of insulin: Secondary | ICD-10-CM

## 2012-12-11 DIAGNOSIS — I10 Essential (primary) hypertension: Secondary | ICD-10-CM

## 2012-12-11 DIAGNOSIS — K219 Gastro-esophageal reflux disease without esophagitis: Secondary | ICD-10-CM | POA: Diagnosis present

## 2012-12-11 DIAGNOSIS — I5032 Chronic diastolic (congestive) heart failure: Secondary | ICD-10-CM | POA: Diagnosis present

## 2012-12-11 HISTORY — PX: MULTIPLE EXTRACTIONS WITH ALVEOLOPLASTY: SHX5342

## 2012-12-11 LAB — COMPREHENSIVE METABOLIC PANEL
ALT: 13 U/L (ref 0–53)
Albumin: 3.3 g/dL — ABNORMAL LOW (ref 3.5–5.2)
Alkaline Phosphatase: 57 U/L (ref 39–117)
Potassium: 4.2 mEq/L (ref 3.5–5.1)
Sodium: 139 mEq/L (ref 135–145)
Total Protein: 6.7 g/dL (ref 6.0–8.3)

## 2012-12-11 LAB — CBC
Hemoglobin: 14.2 g/dL (ref 13.0–17.0)
MCHC: 33.6 g/dL (ref 30.0–36.0)
RDW: 12 % (ref 11.5–15.5)

## 2012-12-11 LAB — GLUCOSE, CAPILLARY
Glucose-Capillary: 100 mg/dL — ABNORMAL HIGH (ref 70–99)
Glucose-Capillary: 147 mg/dL — ABNORMAL HIGH (ref 70–99)

## 2012-12-11 LAB — CK TOTAL AND CKMB (NOT AT ARMC): Relative Index: INVALID (ref 0.0–2.5)

## 2012-12-11 LAB — TROPONIN I: Troponin I: <0.3 ng/mL (ref <0.30)

## 2012-12-11 SURGERY — MULTIPLE EXTRACTION WITH ALVEOLOPLASTY
Anesthesia: General | Wound class: Clean Contaminated

## 2012-12-11 MED ORDER — METOPROLOL TARTRATE 1 MG/ML IV SOLN: 5.0000 mg | Freq: Four times a day (QID) | INTRAVENOUS | Status: DC | PRN

## 2012-12-11 MED ORDER — HYDROMORPHONE HCL PF 1 MG/ML IJ SOLN
0.2500 mg | INTRAMUSCULAR | Status: DC | PRN
  Administered 2012-12-11 (×4): 0.5 mg via INTRAVENOUS

## 2012-12-11 MED ORDER — LIDOCAINE-EPINEPHRINE 2 %-1:100000 IJ SOLN
INTRAMUSCULAR | Status: DC | PRN
  Administered 2012-12-11: 8.5 mL

## 2012-12-11 MED ORDER — LACTATED RINGERS IV SOLN: INTRAVENOUS | Status: DC

## 2012-12-11 MED ORDER — INSULIN ASPART 100 UNIT/ML ~~LOC~~ SOLN
0.0000 [IU] | Freq: Three times a day (TID) | SUBCUTANEOUS | Status: DC
  Administered 2012-12-11: 2 [IU] via SUBCUTANEOUS
  Administered 2012-12-12: 3 [IU] via SUBCUTANEOUS
  Administered 2012-12-12: 1 [IU] via SUBCUTANEOUS

## 2012-12-11 MED ORDER — GLYCOPYRROLATE 0.2 MG/ML IJ SOLN
INTRAMUSCULAR | Status: DC | PRN
  Administered 2012-12-11: 0.4 mg via INTRAVENOUS

## 2012-12-11 MED ORDER — SODIUM CHLORIDE 0.9 % IJ SOLN
3.0000 mL | Freq: Two times a day (BID) | INTRAMUSCULAR | Status: DC
  Administered 2012-12-12: 3 mL via INTRAVENOUS

## 2012-12-11 MED ORDER — LACTATED RINGERS IV SOLN
INTRAVENOUS | Status: DC
  Administered 2012-12-11: 1000 mL via INTRAVENOUS

## 2012-12-11 MED ORDER — PROMETHAZINE HCL 25 MG/ML IJ SOLN: 6.2500 mg | INTRAMUSCULAR | Status: DC | PRN

## 2012-12-11 MED ORDER — LIDOCAINE HCL (CARDIAC) 20 MG/ML IV SOLN
INTRAVENOUS | Status: DC | PRN
  Administered 2012-12-11: 30 mg via INTRAVENOUS

## 2012-12-11 MED ORDER — PHENYLEPHRINE HCL 10 MG/ML IJ SOLN
INTRAMUSCULAR | Status: DC | PRN
  Administered 2012-12-11: 40 ug via INTRAVENOUS

## 2012-12-11 MED ORDER — ACETAMINOPHEN 500 MG PO TABS: 500.0000 mg | ORAL_TABLET | Freq: Four times a day (QID) | ORAL | Status: DC | PRN

## 2012-12-11 MED ORDER — SODIUM CHLORIDE 0.9 % IV SOLN
INTRAVENOUS | Status: DC
  Administered 2012-12-11: 1000 mL via INTRAVENOUS

## 2012-12-11 MED ORDER — HYDROCODONE-ACETAMINOPHEN 5-325 MG PO TABS
1.0000 | ORAL_TABLET | ORAL | Status: DC | PRN
  Administered 2012-12-12: 2 via ORAL
  Filled 2012-12-11: qty 2

## 2012-12-11 MED ORDER — EPHEDRINE SULFATE 50 MG/ML IJ SOLN
INTRAMUSCULAR | Status: DC | PRN
  Administered 2012-12-11: 10 mg via INTRAVENOUS
  Administered 2012-12-11 (×2): 7.5 mg via INTRAVENOUS
  Administered 2012-12-11 (×4): 5 mg via INTRAVENOUS

## 2012-12-11 MED ORDER — NEOSTIGMINE METHYLSULFATE 1 MG/ML IJ SOLN
INTRAMUSCULAR | Status: DC | PRN
  Administered 2012-12-11: 3 mg via INTRAVENOUS

## 2012-12-11 MED ORDER — KETAMINE HCL 10 MG/ML IJ SOLN
INTRAMUSCULAR | Status: DC | PRN
  Administered 2012-12-11: 25 mg via INTRAVENOUS

## 2012-12-11 MED ORDER — MIDAZOLAM HCL 5 MG/5ML IJ SOLN
INTRAMUSCULAR | Status: DC | PRN
  Administered 2012-12-11: 1 mg via INTRAVENOUS

## 2012-12-11 MED ORDER — PROPOFOL INFUSION 10 MG/ML OPTIME
INTRAVENOUS | Status: DC | PRN
  Administered 2012-12-11: 200 mL via INTRAVENOUS

## 2012-12-11 MED ORDER — CEFAZOLIN SODIUM-DEXTROSE 2-3 GM-% IV SOLR
INTRAVENOUS | Status: AC
  Filled 2012-12-11: qty 50

## 2012-12-11 MED ORDER — ONDANSETRON HCL 4 MG/2ML IJ SOLN
INTRAMUSCULAR | Status: DC | PRN
  Administered 2012-12-11 (×2): 2 mg via INTRAVENOUS

## 2012-12-11 MED ORDER — SODIUM CHLORIDE 0.9 % IV SOLN
3.0000 g | Freq: Four times a day (QID) | INTRAVENOUS | Status: DC
  Administered 2012-12-11 – 2012-12-12 (×3): 3 g via INTRAVENOUS
  Filled 2012-12-11 (×5): qty 3

## 2012-12-11 MED ORDER — ESMOLOL BOLUS VIA INFUSION
500.0000 ug/kg | Freq: Once | INTRAVENOUS | Status: AC
  Administered 2012-12-11: 50 mg via INTRAVENOUS

## 2012-12-11 MED ORDER — DOCUSATE SODIUM 100 MG PO CAPS
100.0000 mg | ORAL_CAPSULE | Freq: Two times a day (BID) | ORAL | Status: DC
  Administered 2012-12-12: 100 mg via ORAL
  Filled 2012-12-11 (×3): qty 1

## 2012-12-11 MED ORDER — MORPHINE SULFATE 2 MG/ML IJ SOLN
2.0000 mg | INTRAMUSCULAR | Status: DC | PRN
  Administered 2012-12-11 – 2012-12-12 (×3): 2 mg via INTRAVENOUS
  Filled 2012-12-11 (×3): qty 1

## 2012-12-11 MED ORDER — OXYCODONE HCL 5 MG/5ML PO SOLN
5.0000 mg | Freq: Once | ORAL | Status: DC | PRN
  Filled 2012-12-11: qty 5

## 2012-12-11 MED ORDER — HYDROMORPHONE HCL PF 1 MG/ML IJ SOLN
INTRAMUSCULAR | Status: AC
  Filled 2012-12-11: qty 1

## 2012-12-11 MED ORDER — LIDOCAINE-EPINEPHRINE 2 %-1:100000 IJ SOLN
INTRAMUSCULAR | Status: AC
  Filled 2012-12-11: qty 8.5

## 2012-12-11 MED ORDER — BUPIVACAINE-EPINEPHRINE PF 0.5-1:200000 % IJ SOLN
INTRAMUSCULAR | Status: AC
  Filled 2012-12-11: qty 10.8

## 2012-12-11 MED ORDER — ACETAMINOPHEN 10 MG/ML IV SOLN
1000.0000 mg | Freq: Once | INTRAVENOUS | Status: DC | PRN
  Filled 2012-12-11: qty 100

## 2012-12-11 MED ORDER — ISOPROPYL ALCOHOL 70 % SOLN
Status: DC | PRN
  Administered 2012-12-11: 1 via TOPICAL

## 2012-12-11 MED ORDER — AMINOCAPROIC ACID SOLUTION 5% (50 MG/ML)
10.0000 mL | ORAL | Status: AC
  Administered 2012-12-11 – 2012-12-12 (×9): 10 mL via ORAL
  Filled 2012-12-11: qty 100

## 2012-12-11 MED ORDER — DILTIAZEM HCL 25 MG/5ML IV SOLN
INTRAVENOUS | Status: AC
  Filled 2012-12-11: qty 5

## 2012-12-11 MED ORDER — OXYMETAZOLINE HCL 0.05 % NA SOLN
NASAL | Status: AC
  Filled 2012-12-11: qty 15

## 2012-12-11 MED ORDER — DILTIAZEM HCL 100 MG IV SOLR
5.0000 mg/h | INTRAVENOUS | Status: DC
  Administered 2012-12-11: 10 mg/h via INTRAVENOUS
  Administered 2012-12-11 – 2012-12-12 (×2): 15 mg/h via INTRAVENOUS
  Filled 2012-12-11: qty 100

## 2012-12-11 MED ORDER — FENTANYL CITRATE 0.05 MG/ML IJ SOLN: 25.0000 ug | INTRAMUSCULAR | Status: DC | PRN

## 2012-12-11 MED ORDER — FENTANYL CITRATE 0.05 MG/ML IJ SOLN
INTRAMUSCULAR | Status: DC | PRN
  Administered 2012-12-11: 50 ug via INTRAVENOUS
  Administered 2012-12-11: 25 ug via INTRAVENOUS
  Administered 2012-12-11: 50 ug via INTRAVENOUS
  Administered 2012-12-11: 25 ug via INTRAVENOUS
  Administered 2012-12-11: 50 ug via INTRAVENOUS

## 2012-12-11 MED ORDER — OXYCODONE-ACETAMINOPHEN 5-325 MG PO TABS
1.0000 | ORAL_TABLET | ORAL | Status: DC | PRN
Start: 2012-12-11 — End: 2012-12-12

## 2012-12-11 MED ORDER — PANTOPRAZOLE SODIUM 40 MG IV SOLR
40.0000 mg | Freq: Two times a day (BID) | INTRAVENOUS | Status: DC
  Administered 2012-12-11: 40 mg via INTRAVENOUS
  Filled 2012-12-11 (×3): qty 40

## 2012-12-11 MED ORDER — BUPIVACAINE-EPINEPHRINE PF 0.5-1:200000 % IJ SOLN
INTRAMUSCULAR | Status: DC | PRN
  Administered 2012-12-11: 5.4 mL

## 2012-12-11 MED ORDER — ONDANSETRON HCL 4 MG PO TABS: 4.0000 mg | ORAL_TABLET | Freq: Four times a day (QID) | ORAL | Status: DC | PRN

## 2012-12-11 MED ORDER — CISATRACURIUM BESYLATE (PF) 10 MG/5ML IV SOLN
INTRAVENOUS | Status: DC | PRN
  Administered 2012-12-11: 4 mg via INTRAVENOUS

## 2012-12-11 MED ORDER — SUCCINYLCHOLINE CHLORIDE 20 MG/ML IJ SOLN
INTRAMUSCULAR | Status: DC | PRN
  Administered 2012-12-11 (×2): 100 mg via INTRAVENOUS

## 2012-12-11 MED ORDER — CEFAZOLIN SODIUM-DEXTROSE 2-3 GM-% IV SOLR
2.0000 g | Freq: Once | INTRAVENOUS | Status: AC
  Administered 2012-12-11: 2 g via INTRAVENOUS

## 2012-12-11 MED ORDER — LACTATED RINGERS IV SOLN
INTRAVENOUS | Status: DC | PRN
  Administered 2012-12-11 (×2): via INTRAVENOUS

## 2012-12-11 MED ORDER — MEPERIDINE HCL 50 MG/ML IJ SOLN: 6.2500 mg | INTRAMUSCULAR | Status: DC | PRN

## 2012-12-11 MED ORDER — ONDANSETRON HCL 4 MG/2ML IJ SOLN: 4.0000 mg | Freq: Four times a day (QID) | INTRAMUSCULAR | Status: DC | PRN

## 2012-12-11 MED ORDER — DILTIAZEM HCL 25 MG/5ML IV SOLN
10.0000 mg | Freq: Once | INTRAVENOUS | Status: AC
  Administered 2012-12-11: 10 mg via INTRAVENOUS

## 2012-12-11 MED ORDER — OXYCODONE HCL 5 MG PO TABS: 5.0000 mg | ORAL_TABLET | Freq: Once | ORAL | Status: DC | PRN

## 2012-12-11 MED ORDER — MORPHINE SULFATE 2 MG/ML IJ SOLN: 1.0000 mg | INTRAMUSCULAR | Status: DC | PRN

## 2012-12-11 SURGICAL SUPPLY — 28 items
ATTRACTOMAT 16X20 MAGNETIC DRP (DRAPES) ×2 IMPLANT
BAG SPEC THK2 15X12 ZIP CLS (MISCELLANEOUS)
BAG ZIPLOCK 12X15 (MISCELLANEOUS) ×1 IMPLANT
BLADE SURG 15 STRL LF DISP TIS (BLADE) ×2 IMPLANT
BLADE SURG 15 STRL SS (BLADE) ×4
CANNULA VESSEL W/WING WO/VALVE (CANNULA) ×4 IMPLANT
CLOTH BEACON ORANGE TIMEOUT ST (SAFETY) ×2 IMPLANT
GAUZE SPONGE 4X4 16PLY XRAY LF (GAUZE/BANDAGES/DRESSINGS) ×3 IMPLANT
GLOVE SURG ORTHO 8.0 STRL STRW (GLOVE) ×4 IMPLANT
GLOVE SURG SS PI 6.5 STRL IVOR (GLOVE) ×2 IMPLANT
GOWN STRL NON-REIN LRG LVL3 (GOWN DISPOSABLE) ×2 IMPLANT
GOWN STRL REIN 3XL LVL4 (GOWN DISPOSABLE) ×2 IMPLANT
HEMOSTAT SURGICEL 2X14 (HEMOSTASIS) ×1 IMPLANT
KIT BASIN OR (CUSTOM PROCEDURE TRAY) ×2 IMPLANT
NS IRRIG 1000ML POUR BTL (IV SOLUTION) ×2 IMPLANT
PACK EENT SPLIT (PACKS) ×2 IMPLANT
PACKING VAGINAL (PACKING) ×2 IMPLANT
PAD EYE OVAL STERILE LF (GAUZE/BANDAGES/DRESSINGS) ×2 IMPLANT
SPONGE GAUZE 4X4 12PLY (GAUZE/BANDAGES/DRESSINGS) ×2 IMPLANT
SPONGE SURGIFOAM ABS GEL 12-7 (HEMOSTASIS) ×1 IMPLANT
SUCTION FRAZIER 12FR DISP (SUCTIONS) ×2 IMPLANT
SUT CHROMIC 3 0 PS 2 (SUTURE) ×8 IMPLANT
SUT CHROMIC 4 0 P 3 18 (SUTURE) IMPLANT
SYR 50ML LL SCALE MARK (SYRINGE) ×2 IMPLANT
TOWEL OR 17X26 10 PK STRL BLUE (TOWEL DISPOSABLE) ×2 IMPLANT
TUBING CONNECTING 10 (TUBING) ×2 IMPLANT
WATER STERILE IRR 1500ML POUR (IV SOLUTION) ×2 IMPLANT
YANKAUER SUCT BULB TIP NO VENT (SUCTIONS) ×2 IMPLANT

## 2012-12-11 NOTE — Consult Note (Signed)
CARDIOLOGY CONSULT NOTE    Patient ID: Aaron Jones MRN: [id number] DOB/AGE: [birth year] 65 y.o.  Admit date: 12/11/2012 Referring Physician Hartley Barefoot MD Primary Physician Yates Decamp MD Primary Cardiologist Olga Millers MD Reason for Consultation Atrial fibrillation.  HPI:  Aaron Jones is seen at the request of Dr. Sunnie Nielsen for evaluation of atrial fibrillation with RVR. He is a 67 year old white male recently admitted from 11/17/12 to 11/25/2012 with atrial fibrillation and rapid ventricular response associated with congestive heart failure. He was diagnosed with a membranous VSD with left to right shunt. He was seen in consultation with Dr. Tyrone Sage with plans to complete his evaluation as an outpatient and return for VSD closure and Maze procedure. Patient was seen by Dr. Kristin Bruins and the patient was admitted today for multiple dental extractions. Patient has been on oral diltiazem and metoprolol for rate control of his atrial fibrillation. Prior to surgery his heart rate was under good control with a rate of 78 beats per minute. Postanesthesia he developed rapid ventricular response. This was controlled with IV diltiazem. Patient denies any significant symptoms of shortness of breath or chest pain. He is seen in the step down unit. Rate is in the 90s on IV diltiazem at 10 mg per hour. The patient has been on apixaban for anticoagulation but this was held for his procedure today.  Review of systems complete and found to be negative unless listed above   Past Medical History  Diagnosis Date  . Hypertension   . Shortness of breath   . Sleep apnea   . Diabetes mellitus without complication   . GERD (gastroesophageal reflux disease)   . Hyperlipidemia   . Tachycardia, paroxysmal May 2013    Possible SVT  . Abnormal heart rhythms 11/2012    A-Fib  . Heart murmur     from birth    History reviewed. No pertinent family history.  History   Social History  . Marital Status: Married     Spouse Name: N/A    Number of Children: N/A  . 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 Smoking status: Never Smoker   . Smokeless tobacco: Never Used  . Alcohol Use: No  . Drug Use: No  . Sexually Active: No   Other Topics Concern  . Not on file   Social History Narrative   Still works full time. Works around the yard. Lives with wife. 2 sisters, neither with cardiac issues.    Past Surgical History  Procedure Laterality Date  . Nasal septum surgery  2004  . Tee without cardioversion N/A 11/21/2012    Procedure: TRANSESOPHAGEAL ECHOCARDIOGRAM (TEE);  Surgeon: Dolores Patty, MD;  Location[facility] ENDOSCOPY;  Service: Cardiovascular;  Laterality: N/A;  . Cardiac catheterization  > 5 yr ago    Done at [facility], reportedly clean  . Cardiac catheterization  11/2012     Prescriptions prior to admission  Medication Sig Dispense Refill  . acetaminophen (TYLENOL) 500 MG tablet Take 500 mg by mouth every 6 (six) hours as needed for pain.      Marland Kitchen atorvastatin (LIPITOR) 20 MG tablet Take 20 mg by mouth at bedtime.      Marland Kitchen diltiazem (CARDIZEM CD) 360 MG 24 hr capsule Take 360 mg by mouth every morning.      . furosemide (LASIX) 40 MG tablet Take 40 mg by mouth every morning.      Marland Kitchen  insulin glargine (LANTUS) 100 units/mL SOLN Inject 18 Units into the skin daily at 10 pm.      . metFORMIN (GLUCOPHAGE-XR) 500 MG 24 hr tablet Take 1,000 mg by mouth 2 (two) times daily.      . metoprolol (LOPRESSOR) 50 MG tablet Take 75 mg by mouth 2 (two) times daily.      Marland Kitchen omeprazole (PRILOSEC) 40 MG capsule Take 40 mg by mouth daily.      . potassium chloride SA (K-DUR,KLOR-CON) 20 MEQ tablet Take 20 mEq by mouth daily.      Marland Kitchen apixaban (ELIQUIS) 5 MG TABS tablet Take 5 mg by mouth 2 (two) times daily.      Marland Kitchen aspirin EC 81 MG tablet Take 81 mg by mouth every morning.       . Glucose Blood (BLOOD GLUCOSE TEST STRIPS) STRP 1  strip by In Vitro route 2 (two) times daily.  100 each  0  . Insulin Pen Needle 31G X 6 MM MISC 18 Units by Does not apply route at bedtime.  50 each  0    Physical Exam: Blood pressure 167/85, pulse 90, temperature 97.8 F (36.6 C), temperature source Oral, resp. rate 15, height 5\' 11"  (1.803 m), weight 224 lb 6.9 oz (101.8 kg), SpO2 97.00%.  He is a well-developed white male in no acute distress. HEENT: Normocephalic, atraumatic.  Pupils are equal and reactive to light accommodation. Sclera clear. His mouth is swollen and packed with gauze. Neck is without JVD, adenopathy, thyromegaly, or bruits. Lungs: Clear Cardiovascular: Irregular rate and rhythm, normal S1 and S2. There is a harsh grade 3/6 systolic murmur heard best at left sternal border. Abdomen: Soft and nontender. No masses or bruits. No by splenomegaly. Skin: Warm and dry Extremities: No cyanosis or edema. Pulses are 2+ and symmetric. Neuro: Alert and oriented x3. Cranial nerves II through XII are intact.  Labs:   Lab Results  Component Value Date   WBC 14.9* 12/11/2012   HGB 14.2 12/11/2012   HCT 42.3 12/11/2012   MCV 93.8 12/11/2012   PLT 208 12/11/2012    Recent Labs Lab 12/11/12 1621  NA 139  K 4.2  CL 103  CO2 24  BUN 15  CREATININE 0.75  CALCIUM 8.8  PROT 6.7  BILITOT 0.4  ALKPHOS 57  ALT 13  AST 18  GLUCOSE 160*   Lab Results  Component Value Date   CKTOTAL 61 12/11/2012   CKMB 2.2 12/11/2012   TROPONINI <0.30 12/11/2012      Radiology:CHEST - 2 VIEW 12/09/12 Comparison: 11/18/2012  Findings: There is moderate cardiac enlargement and pulmonary  vascular congestion. This is unchanged from previous exam. No  pleural effusion or interstitial edema. There is mild spondylosis  within the thoracic spine. No airspace consolidation.  IMPRESSION:  Cardiac enlargement and pulmonary venous congestion.  Original Report Authenticated By: Signa Kell, M.D.  EKG: Atrial fibrillation with rate 90 bpm. RBBB, LAFB, LVH  by voltage.  Echo: 6/16/14Study Conclusions  - Left ventricle: The cavity size was mildly dilated. Wall thickness was increased in a pattern of mild LVH. The estimated ejection fraction was 50%. Mild diffuse hypokinesis. Indeterminant diastolic function (atrial fibrillation). - Ventricular septum: Suspect there is a small perimembranous VSD. - Aortic valve: There was no stenosis. Mild regurgitation. - Mitral valve: Trivial regurgitation. - Left atrium: The atrium was mildly dilated. - Right ventricle: The cavity size was mildly dilated. Systolic function was normal. - Right atrium: The atrium was moderately  dilated. - Tricuspid valve: Peak RV-RA gradient: 29mm Hg (S). - Pulmonary arteries: PA systolic pressure 40-44 mmHg. - Systemic veins: IVC measured 2.4 cm with < 50% respirophasic variation, suggesting RA pressure 11-15 mmHg. Impressions:  - The patient appeared to be in atrial fibrillation. The LV was mildly dilated with mild diffuse hypokinesis, EF 50%. There appeared to be a perimembranous VSD. The RV was mildly dilated with normla systolic function. There was mild pulmonary hypertension. Would consider TEE for closer evaluation of VSD.  TEE: 6/19/14FINDINGS:  LEFT VENTRICLE: EF = 50-55%. No regional wall motion abnormalities  INTRAVENTRICULAR SEPTUM: Small to moderate sized perimembranous VSD immediately under AoV with prominent L->R shunting   RIGHT VENTRICLE: Mildly dilated. Mild HK  LEFT ATRIUM: Dilated  LEFT ATRIAL APPENDAGE: No thrombus.  RIGHT ATRIUM: Dilated  AORTIC VALVE: Trileaflet. Mild AI.  MITRAL VALVE: Normal. Mild to moderate MR  TRICUSPID VALVE: Dilated annulus moderate TR  PULMONIC VALVE: Grossly normal. Mild PR  INTERATRIAL SEPTUM: No PFO or ASD.  PERICARDIUM: No effusion  DESCENDING AORTA: Mild plaque   Cardiac Cath: 11/20/2012  RA = 19  RV = 45/15/18  PA = 48/24 (34)  PCW = 22 (v = 35)  Fick cardiac output/index = 4.9/2.2  PVR = 1.2 [place]   SVR = 1040  FA sat = 97%  SVC sat = 62%  RA sat = 64%  RV sat = 78%  PA sat = 81%  Qp/QS = 2.1  Ao Pressure: 116/69 (86)  LV Pressure: 106/13/18  There was no signficant gradient across the aortic valve on pullback.  Left main: Normal  LAD: Mild plaque in proximal portion  Ramus: Very large branching vessel. Normal  LCX: Small vessel. normal  RCA: Dominant vessel. 20% mid. 80-90% hazy lesion in ostium of small to moderate -sized PDA (2.25 mm)  LV-gram done in the RAO projection: Ejection fraction = 45% global HK  Assessment:  1. 1v CAD as described above  2. Significantly elevated R-sided filling pressure  3. VSD with evidence of moderate L -> R shunt by Qp/QS ratio  4. EF 45%  Plan/Discussion:  Based on cath numbers VSD appears hemodynamically significant. Will arrange TEE.  He has significant lesion in small-to-moderate sized PDA. CP occurred during rapid AF but uncommon with exertion. I reviewed with Dr. Clifton James lesion is approachable percutaneously but given lack of significant exertional CP and possible need for CT surgery for VSD will treat medically for now.     ASSESSMENT AND PLAN:  1. Atrial fibrillation with RVR. Exacerbated by anesthesia, pain, and epinephrine. Rate is currently well controlled on IV diltiazem. I expect that his rate will normalize with resumption of his previous oral therapy. We'll need to resume anticoagulation once cleared by Dr. Kristin Bruins. 2. Ventricular septal defect with left to right shunt 3. Hypertension 4. Diabetes mellitus type 2. 5. Diastolic CHF currently well compensated.   SignedTheron Arista [facility] 12/11/2012, 5:42 PM

## 2012-12-11 NOTE — Transfer of Care (Signed)
Immediate Anesthesia Transfer of Care Note  Patient: Aaron Jones  Procedure(s) Performed: Procedure(s): Extraction of tooth #'s 2,3,4,5,14,15,17,21,22,23,24,25,26,27,32 wioth alveoloplasty and bialteral fibrous tuberosity reductions. (N/A)  Patient Location: PACU  Anesthesia Type:General  Level of Consciousness: awake, alert , oriented and patient cooperative  Airway & Oxygen Therapy: Patient Spontanous Breathing and Patient connected to face mask oxygen  Post-op Assessment: Report given to PACU RN and Post -op Vital signs reviewed and unstable, Anesthesiologist notified  Post vital signs: stable  Complications: No apparent anesthesia complications

## 2012-12-11 NOTE — H&P (Signed)
12/11/2012  Patient:            Aaron Jones Date of Birth:  [birth year] MRN:                [id number]  PRE-OPERATIVE NOTE:  VITALS: BP 123/80  Pulse 78  Temp(Src) 97.6 F (36.4 C) (Oral)  Resp 18  SpO2 100%  Lab Results  Component Value Date   WBC 8.0 12/09/2012   HGB 14.0 12/09/2012   HCT 42.2 12/09/2012   MCV 92.5 12/09/2012   PLT 237 12/09/2012   BMET    Component Value Date/Time   NA 139 12/09/2012 1345   K 4.1 12/09/2012 1345   CL 103 12/09/2012 1345   CO2 30 12/09/2012 1345   GLUCOSE 83 12/09/2012 1345   BUN 17 12/09/2012 1345   CREATININE 0.77 12/09/2012 1345   CALCIUM 9.5 12/09/2012 1345   GFRNONAA >90 12/09/2012 1345   GFRAA >90 12/09/2012 1345    Lab Results  Component Value Date   INR 1.10 11/18/2012   INR 1.07 11/17/2012   No results found for this basename: PTT     Patient presents for  multiple extraction of remaining teeth with alveoloplasty and pre-prosthetic surgery as indicated in the operative room and general anesthesia.the patient discontinued his anticoagulant on Sunday morning.  We again discussed the risks, benefits, and competitions of the anticipated treatment with complications up to and including death specifically addressed postoperative bleeding . Patient agrees to proceed with treatment as planned.   SUBJECTIVE: The patient denies any acute medical or dental changes and agrees to proceed with treatment as planned.  EXAM: No sign of acute dental changes.  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   PLAN: Patient agrees to proceed with treatment as planned in the operating room as previously discussed and accepts the risks, benefits, complications of the proposed treatment.  Charlynne Pander, DDS

## 2012-12-11 NOTE — Op Note (Signed)
Patient:            Aaron Jones Date of Birth:  1945-08-30 MRN:                [id number]   DATE OF PROCEDURE:  12/11/2012               OPERATIVE REPORT   PREOPERATIVE DIAGNOSES: 1. Ventricular septal defect 2. Pre-heart surgery dental protocol 3. Chronic periodontitis 4. Dental caries 5. Bilateral maxillary excessive tuberosities  POSTOPERATIVE DIAGNOSES: 1. Ventricular septal defect 2. Pre-heart surgery dental protocol 3. Chronic periodontitis 4. Dental caries 5. Bilateral maxillary excessive tuberosities  OPERATIONS: 1. Multiple extraction of tooth numbers 2, 3, 4, 5, 14, 15, 17, 21, 22, 23, 24, 25, 26, 27, and 32. 2. 4 Quadrants of alveoloplasty 3. Bilateral maxillary fibrous tuberosity reductions   SURGEON: Charlynne Pander, DDS  ASSISTANT: Zettie Pho, (dental assistant)  ANESTHESIA: General anesthesia via Oral endotracheal tube.  MEDICATIONS: 1. Ancef 2 g IV prior to invasive dental procedures. 2. Local anesthesia with a total utilization of 5 carpules each containing 34 mg of lidocaine with 0.017 mg of epinephrine as well as 3 carpules each containing 9 mg of bupivacaine with 0.009 mg of epinephrine. 3. Amicar 5% oral rinse. Patient is to rinse with 10 ML's every hour for 10 hours. Patient is to rinse gently and spit out excess. Do not swallow.  SPECIMENS: There are 15 teeth that were discarded.  DRAINS: None  CULTURES: None  COMPLICATIONS: There was maxillary left sinus involvement in the area of tooth numbers 14 and 15. This area was closed primarily with sutures from the maxillary left tuberosity and extended to the mesial of #11.   ESTIMATED BLOOD LOSS: 150 mLs.  INTRAVENOUS FLUIDS: 1400 mLs of Lactated ringers solution.  INDICATIONS: The patient was recently diagnosed with a ventricular septal defect.  A medically necessary dental consultation was then requested to rule out dental infection that may affect the patient's systemic health and  anticipated ventricular septal defect repair.  The patient was examined and treatment planned for extraction of all remaining teeth with alveoloplasty and pre-prosthetic surgery as indicated.  This treatment plan was formulated to decrease the risks and complications associated with dental infection from affecting the patient's systemic health and the anticipated ventricular septal defect repair as well to prevent future endocarditis.  OPERATIVE FINDINGS: Patient was examined operating room number 12.  The teeth were identified for extraction. The patient was noted be affected by chronic periodontitis, dental caries, tooth mobility, and bilateral maxillary excessive tuberosities.   DESCRIPTION OF PROCEDURE: Patient was brought to the main operating room number 12. Patient was then placed in the supine position on the operating table. General anesthesia was then induced per the anesthesia team. The patient was then prepped and draped in the usual manner for dental medicine procedure. A timeout was performed. The patient was identified and procedures were verified. A throat pack was placed at this time. The oral cavity was then thoroughly examined with the findings noted above. The patient was then ready for dental medicine procedure as follows:  Local anesthesia was then administered sequentially with a total utilization of 5 carpules each containing 34 mg of lidocaine with 0.017 mg of epinephrine as well as 3 carpules  each containing 9 mg bupivacaine with 0.009 mg of epinephrine.  The Maxillary left and right quadrants first approached. Anesthesia was then delivered utilizing infiltration with lidocaine with epinephrine. A #15 blade incision was then made from  the maxillary left tuberosity and extended to the mesial #11.  A  surgical flap was then carefully reflected. Appropriate amounts of buccal and interseptal bone were then removed utilizing a surgical handpiece and bur and copious amounts of sterile  saline.  The teeth were then subluxated with a series of straight elevators. Tooth numbers 14 and 15 were then removed with a 53L forceps. A significant portion of buccal bone was removed with tooth numbers 14 and 15 with floor of sinus involvement.  Alveoloplasty was then performed utilizing a ronguers and bone file. The maxillary left fibrous tuberosity reduction was then achieved utilizing 15 blade and multiple incisions to remove the redundant tissue. The surgical site was then irrigated with copious amounts of sterile saline. The tissues were approximated and trimmed appropriately to allow for primary closure of the surgical site. The surgical site was then closed from the maxillary left tuberosity and extended the mesial #11 utilizing 3-0 chromic gut suture in a continuous interrupted suture technique x1. Individual interrupted sutures are then placed to further closed surgical site as needed.  At this point time, the mandibular quadrants were approached. The patient was given bilateral inferior alveolar nerve blocks and long buccal nerve blocks utilizing the bupivacaine with epinephrine. Further infiltration was then achieved utilizing the lidocaine with epinephrine. A 15 blade incision was then made from the distal of number 17 and extended to the distal of #29.  A surgical flap was then carefully reflected. Appropriate amounts of buccal and interseptal bone were then removed appropriately. The coronal aspect of tooth #17 was then removed with a 23 forceps leaving the roots remaining. Further bone was then removed utilizing a surgical handpiece and bur and copious amounts of sterile saline.   The remaining roots were then elevated out with a cryers elevator without further complications. Tooth numbers 21, 22, 23, 24, 25, 26, 27, and retained roots in the area numbers 32 were then subluxated with a series of straight elevators. These teeth were then removed with a 151 forceps without complications.   Alveoloplasty was then performed utilizing a rongeurs and bone file. Extensive bone loss was noted at this time .  The tissues were approximated and trimmed appropriately. The surgical sites were then irrigated with copious amounts of sterile saline. A piece of Surgicel was then placed in the extraction socket appropriately. The surgical site was then closed from the distal of #17 and extended to the mesial of #24 utilizing 3-0 chromic gut suture in a continuous interrupted suture technique x1. The mandibular right surgical site was closed from the distal of #29 and extended to the mesial numbers 25 utilizing 3-0 chromic gut suture in a continuous interrupted suture technique x1. 3 individual interrupted sutures are then placed to further close the surgical site in the mandibular anterior area. The surgical site in the area of #32 was then approached and closed utilizing 3-0 chromic gut suture from the distal of #32 and extended to the mesial #31 utilizing 3-0 chromic gut suture in a continuous interrupted suture technique x1.  At this point time the oral endotracheal tube was moved from the right side in the mouth to the left side in the mouth with the assistance of the anesthesia team. The maxillary right surgical site was then approached.  A 15 blade incision was then made from the maxillary right tuberosity and extended to the mesial of #7. A surgical flap was then carefully reflected appropriate amounts of buccal and interseptal bone was removed with  a surgical handpiece and bur and copious amounts of sterile water.  The teeth were then carefully subluxated with a series of straight elevators. Tooth numbers 2, 3 were then removed without complication with a 53R forceps.  Tooth numbers 4 and 5 were then removed with a 150 forceps without complications. Alveoloplasty was then performed utilizing a rongeur and bone file.  The maxillary right fibrous tuberosity reduction was then achieved utilizing a 15 blade  and multiple soft tissue excisions. The surgical site was then irrigated with copious amounts of sterile saline. A piece of Surgifoam was then placed in the extraction sockets appropriately.  The surgical site was then closed and the maxillary right tuberosity and extended to the mesial numbers 7 utilizing 3-0 chromic gut suture in a continuous interrupted suture technique x1.  At this point time, the entire mouth was irrigated with copious amounts of sterile saline. The patient was examined for further complications, seeing none, the dental medicine procedure was deemed to be complete. The throat pack was removed at this time. A series of 4 x 4 gauze were placed in the mouth to aid hemostasis. The patient was then handed over to the anesthesia team for final disposition. After an appropriate amount of time, the patient was extubated and taken to the postanesthsia care unit. All counts were correct for the dental medicine procedure. The patient was placed on a Amicar 5% oral rinse. Patient is to rinse with 10 amounts every hour for the next 10 hours to aid hemostasis. Patient is to swish and spit. Patient is not to swallow. Patient was placed on antibiotic therapy due to the maxillary left sinus involvement. Decongestants will not be utilized to do his history of cardiac arrhythmias.    Charlynne Pander, DDS.

## 2012-12-11 NOTE — H&P (Signed)
12/11/2012  Patient:            Aaron Jones Date of Birth:  [birth year] MRN:                [id number]  Akili Corsetti presents for dental procedures in the operating room with general anesthesia. Patient denies having any acute medical or dental changes. The pateint discontinued his anticoagulant on "Sunday morning. Patient denies having any problems with his heart rate, shortness of breath, or chest pain at this time.  The patient agrees to proceed with procedures as planned. Please see Dr. Gerhardt's note of 11/21/2012 2 act as a history and physical for dental procedures.  Hannahmarie Asberry F. Niara Bunker, DDS  Progress Notes signed by Edward B Gerhardt, MD at 11/23/2012  9:40 PM    Author: Edward B Gerhardt, MD Service: Cardiothoracic Surgery Author Type: Physician    Filed: 11/23/2012  9:40 PM Note Time: 11/21/2012  7:25 PM           Patient ID: Amirr Lamke, male   DOB: 04/11/1946, 67 y.o.   MRN: [id number]        [address]       [place] [address]             [phone]                                         Jarrah Venn [place] Medical Record #[id number] Date of Birth: 08/31/1945   Referring: Dr B Crenshaw Primary Care: WALKER, JOHN B, MD   Chief Complaint:   Admitted with rapid afib new onset     History of Present Illness:      67"  y.o. male with no history of CAD History of diabetes, hypertension, hypercholesterolemia. History of  tachycardia in 2013, possible SVT started  Cardizem. Follows with cardiologist, Dr. Lady Gary in [place].   About 2-3 weeks ago when he noticed he would become sob with climbing stairs and with doing yard work.  Symptoms improved with rest. For the past week and a half he has noticed increased ankle swelling and onset of PND. About a week ago he developed a tightening sensation around his chest and abdomen along with some muscle cramping In adductors with increased activity. The day of admission the tightening sensation in his chest  and abdomen progressed to a heavy pressure that he rated a 6/10. He went to urgent care and then was sent to [facility], and [facility] and admitted on the hospitalist service .Patient reports that his pain  resolved as soon as he received cardizem.    At this time the patient is comfortable without chest pain or SOB     Current Activity/ Functional Status: Patient is independent with mobility/ambulation, transfers, ADL's, IADL's.   Zubrod Score: At the time of surgery this patient's most appropriate activity status/level should be described as: [ ]          Normal activity, no symptoms [X]         Symptoms, fully ambulatory [ ]          Symptoms, in bed less than or equal to 50% of the time [ ]          Symptoms, in bed greater than 50% of the time but less than 100% [ ]          Bedridden [ ]   Moribund    Past Medical History   Diagnosis  Date   .  Hypertension     .  Shortness of breath     .  Sleep apnea     .  Diabetes mellitus without complication     .  Heart murmur     .  GERD (gastroesophageal reflux disease)     .  Hyperlipidemia     .  Tachycardia, paroxysmal  May 2013       Possible SVT         Past Surgical History   Procedure  Laterality  Date   .  Nasal septum surgery    2004   .  Cardiac catheterization    > 5 yr ago       Done at [employer], reportedly clean         History   Smoking status   .  Never Smoker    Smokeless tobacco   .  Never Used       History   Alcohol Use  No         History       Social History   .  Marital Status:  Married       Spouse Name:  N/A       Number of Children:  N/A   .  Years of Education:  N/A       Occupational History   .  Manufacturing         Heavy work at times       Social History Main Topics   .  Smoking status:  Never Smoker    .  Smokeless tobacco:  Never Used   .  Alcohol Use:  No   .  Drug Use:  No   .  Sexually Active:  Not on file       Other Topics  Concern    .  Not on file       Social History Narrative     Still works full time. Works around the yard. Lives with wife. 2 sisters, neither with cardiac issues.         Allergies   Allergen  Reactions   .  Biaxin (Clarithromycin)  Nausea Only         Current Facility-Administered Medications   Medication  Dose  Route  Frequency  Provider  Last Rate  Last Dose   .  0.9 %  sodium chloride infusion   250 mL  Intravenous  PRN  Lars Mage, MD  10 mL/hr at 11/18/12 0202  250 mL at 11/18/12 0202   .  0.9 %  sodium chloride infusion     Intravenous  Continuous  Bevelyn Buckles Bensimhon, MD         .  0.9 %  sodium chloride infusion     Intravenous  Continuous  Gery Pray, PA-C  10 mL/hr at 11/20/12 2212      .  acetaminophen (TYLENOL) tablet 650 mg   650 mg  Oral  Q4H PRN  Dolores Patty, MD         .  aspirin chewable tablet 81 mg   81 mg  Oral  Daily  Lewayne Bunting, MD     81 mg at 11/21/12 1726   .  atorvastatin (LIPITOR) tablet 80 mg   80 mg  Oral  q1800  Lewayne Bunting, MD     80 mg at  11/21/12 1723   .  diltiazem (CARDIZEM) tablet 60 mg   60 mg  Oral  Q6H  Lewayne Bunting, MD     60 mg at 11/21/12 1723   .  fentaNYL (SUBLIMAZE) injection       PRN  Dolores Patty, MD     25 mcg at 11/21/12 1534   .  furosemide (LASIX) tablet 20 mg   20 mg  Oral  Daily  Lewayne Bunting, MD     20 mg at 11/21/12 1723   .  heparin ADULT infusion 100 units/mL (25000 units/250 mL)   1,500 Units/hr  Intravenous  Continuous  Lonia Blood, MD  15 mL/hr at 11/21/12 1001  1,500 Units/hr at 11/21/12 1001   .  insulin aspart (novoLOG) injection 0-9 Units   0-9 Units  Subcutaneous  TID WC  Lars Mage, MD     3 Units at 11/20/12 1743   .  insulin glargine (LANTUS) injection 18 Units   18 Units  Subcutaneous  QHS  Lonia Blood, MD     18 Units at 11/20/12 2215   .  lidocaine (XYLOCAINE) 2 % viscous mouth solution       PRN  Dolores Patty, MD     20 mL at 11/21/12 1530   .  metoprolol tartrate  (LOPRESSOR) tablet 75 mg   75 mg  Oral  BID  Russella Dar, NP     75 mg at 11/21/12 1722   .  midazolam (VERSED) injection       PRN  Dolores Patty, MD     2 mg at 11/21/12 1537   .  morphine 2 MG/ML injection 2 mg   2 mg  Intravenous  Q4H PRN  Lars Mage, MD         .  ondansetron (ZOFRAN) tablet 4 mg   4 mg  Oral  Q6H PRN  Lars Mage, MD           Or   .  ondansetron (ZOFRAN) injection 4 mg   4 mg  Intravenous  Q6H PRN  Lars Mage, MD         .  ondansetron (ZOFRAN) injection 4 mg   4 mg  Intravenous  Q6H PRN  Dolores Patty, MD         .  sodium chloride 0.9 % injection 3 mL   3 mL  Intravenous  Q12H  Lars Mage, MD     3 mL at 11/21/12 1239   .  sodium chloride 0.9 % injection 3 mL   3 mL  Intravenous  PRN  Lars Mage, MD     3 mL at 11/21/12 1239         Prescriptions prior to admission   Medication  Sig  Dispense  Refill   .  acetaminophen (TYLENOL) 500 MG tablet  Take 500 mg by mouth every 6 (six) hours as needed for pain.         Marland Kitchen  aspirin EC 81 MG tablet  Take 81 mg by mouth daily.         Marland Kitchen  atorvastatin (LIPITOR) 20 MG tablet  Take 20 mg by mouth at bedtime.         Marland Kitchen  diltiazem (CARDIZEM LA) 120 MG 24 hr tablet  Take 120 mg by mouth daily.         Marland Kitchen  lisinopril (PRINIVIL,ZESTRIL) 20 MG tablet  Take  20 mg by mouth daily.         .  metFORMIN (GLUCOPHAGE-XR) 500 MG 24 hr tablet  Take 1,000 mg by mouth 2 (two) times daily.         .  metoprolol succinate (TOPROL-XL) 100 MG 24 hr tablet  Take 150 mg by mouth daily. Take with or immediately following a meal.         .  omeprazole (PRILOSEC) 40 MG capsule  Take 40 mg by mouth daily.         .  pioglitazone (ACTOS) 30 MG tablet  Take 30 mg by mouth daily.              History reviewed. No pertinent family history.     Review of Systems:                Cardiac Review of Systems: Y or N             Chest Pain [ y when in rapid af Laodice.Slates   ]          Resting SOB [ y  ]      Exertional SOB  [ y ]        Pollyann Kennedy Milo.Brash   ]             Pedal Edema [ y  ]      Palpitations Cove.Etienne  ]          Syncope  [ n ]             Presyncope [ n  ]             General Review of Systems: [Y] = yes [  ]=no Constitional: recent weight change Cove.Etienne  ]; anorexia [  ]; fatigue Cove.Etienne  ]; nausea [  ]; night sweats [  ]; fever [  ]; or chills [  ]                                                               Dental: poor dentition[ yes ]; Last Dentist visit: several years             Eye : blurred vision [  ]; diplopia [   ]; vision changes [n  ];  Amaurosis fugax[n  ]; Resp: cough [  ];  wheezing[  ];  hemoptysis[  ]; shortness of breath[ y ]; paroxysmal nocturnal dyspnea[y  ]; dyspnea on exertion[ y ]; or orthopnea[  ];   GI:  gallstones[  ], vomiting[  ];  dysphagia[  ]; melena[  ];  hematochezia [  ]; heartburn[  ];   Hx of  Colonoscopy[  ]; GU: kidney stones [  ]; hematuria[  ];   dysuria [  ];  nocturia[  ];  history of     obstruction [n  ]; urinary frequency [ n ]             Skin: rash, swelling[  ];, hair loss[  ];  peripheral edema[  ];  or itching[  ]; Musculosketetal: myalgias[  ];  joint swelling[  ];  joint erythema[  ];  joint pain[  ];  back pain[  ];  Heme/Lymph: bruising[  ];  bleeding[ n ];  anemia[  ];   Neuro: TIA[n  ];  headaches[  ];  stroke[  ];  vertigo[  ];  seizures[ n ];   paresthesias[ n ];  difficulty walking[  n];             Psych:depression[n  ]; anxiety[n  ];             Endocrine: diabetes[ n ];  thyroid dysfunction[  ];             Immunizations: Flu [  ]; Pneumococcal[  ];             Other:   Physical Exam: BP 152/104  Pulse 110  Temp(Src) 97.5 F (36.4 C) (Oral)  Resp 17  Ht 6' (1.829 m)  Wt 230 lb 9.6 oz (104.6 kg)  BMI 31.27 kg/m2  SpO2 96%   General appearance: alert, cooperative, appears older than stated age and no distress Neurologic: intact Heart: regular rate and rhythm and systolic murmur: holosystolic 3/6, blowing throughout the precordium Lungs: clear to auscultation  bilaterally Abdomen: soft, non-tender; bowel sounds normal; no masses,  no organomegaly Extremities: extremities normal, atraumatic, no cyanosis or edema and Homans sign is negative, no sign of DVT No carotid bruits, full femoral, dt and Pt pulses bilaterial   Diagnostic Studies & Laboratory data:     Recent Radiology Findings:  X-ray Chest Pa And Lateral    11/18/2012   *RADIOLOGY REPORT*  Clinical Data: Short of breath  CHEST - 2 VIEW  Comparison: None.  Findings: Heart silhouette is enlarged.  There is central venous pulmonary congestion.  Small bilateral pleural effusions.  No focal infiltrate.  No overt pulmonary edema. No acute osseous abnormality.  IMPRESSION: Cardiomegaly, central venous congestion, and small effusions.   Original Report Authenticated By: Genevive Bi, M.D.       Recent Lab Findings: Lab Results   Component  Value  Date     WBC  7.1  11/21/2012     HGB  14.2  11/21/2012     HCT  41.3  11/21/2012     PLT  228  11/21/2012     GLUCOSE  174*  11/21/2012     ALT  65*  11/17/2012     AST  36  11/17/2012     NA  141  11/21/2012     K  3.9  11/21/2012     CL  106  11/21/2012     CREATININE  0.83  11/21/2012     BUN  15  11/21/2012     CO2  28  11/21/2012     TSH  1.571  11/17/2012     INR  1.10  11/18/2012     HGBA1C  10.6*  11/17/2012      ZOX:WRUEAVWU:   LEFT VENTRICLE: EF = 50-55%. No regional wall motion abnormalities   INTRAVENTRICULAR SEPTUM: Small to moderate sized perimembranous VSD immediately under AoV with prominent L->R shunting    RIGHT VENTRICLE: Mildly dilated. Mild HK   LEFT ATRIUM: Dilated   LEFT ATRIAL APPENDAGE: No thrombus.   RIGHT ATRIUM: Dilated   AORTIC VALVE: Trileaflet. Mild AI.   MITRAL VALVE: Normal. Mild to moderate MR   TRICUSPID VALVE: Dilated annulus moderate TR   PULMONIC VALVE: Grossly normal. Mild PR   INTERATRIAL SEPTUM: No PFO or ASD.   PERICARDIUM: No effusion   DESCENDING AORTA: Mild plaque    CATH:Cardiac Cath Procedure  Note   Indication: CP (in setting of AF with RVR) and VSD   Procedures performed:   1) Right heart cathererization   2) Selective coronary angiography   3) Left heart catheterization   4) Left ventriculogram   5) Shunt run   Description of procedure:    The risks and indication of the procedure were explained. Consent was signed and placed on the chart. An appropriate timeout was taken prior to the procedure. The right groin was prepped and draped in the routine sterile fashion and anesthetized with 1% local lidocaine.   A 5 FR arterial sheath was placed in the right femoral artery using a modified Seldinger technique. Standard catheters including a JL4, JR4 and angled pigtail were used. All catheter exchanges were made over a wire. A 7 FR venous sheath was placed in the right femoral vein using a modified Seldinger technique. A standard Swan-Ganz catheter was used for the procedure.   Complications: None apparent   Total contrast: 80 cc   Findings:   RA = 19   RV = 45/15/18   PA = 48/24 (34)   PCW = 22 (v = 35)   Fick cardiac output/index = 4.9/2.2   PVR = 1.2 Woods   SVR = 1040   FA sat = 97%   SVC sat = 62%   RA sat = 64%   RV sat = 78%   PA sat = 81%   Qp/QS = 2.1   Ao Pressure: 116/69 (86)   LV Pressure: 106/13/18   There was no signficant gradient across the aortic valve on pullback.   Left main: Normal   LAD: Mild plaque in proximal portion   Ramus: Very large branching vessel. Normal   LCX: Small vessel. normal   RCA: Dominant vessel. 20% mid. 80-90% hazy lesion in ostium of small to moderate -sized PDA (2.25 mm)   LV-gram done in the RAO projection: Ejection fraction = 45% global HK   Assessment:   1. 1v CAD as described above   2. Significantly elevated R-sided filling pressure   3. VSD with evidence of moderate L -> R shunt by Qp/QS ratio   4. EF 45%   Plan/Discussion:   Based on cath numbers VSD appears hemodynamically significant. Will arrange TEE.   He has  significant lesion in small-to-moderate sized PDA. CP occurred during rapid AF but uncommon with exertion. I reviewed with Dr. Clifton James lesion is approachable percutaneously but given lack of significant exertional CP and possible need for CT surgery for VSD will treat medically for now.   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 I can see the patient in the office after dental issues are resolved to further review with him and his wife the planned procedure. Each time I have seen the patient his wife has not been here.  Delight Ovens MD     [address] [place] [address] Office [phone]                Beeper [phone]   11/22/2012 7:26 PM

## 2012-12-11 NOTE — Transfer of Care (Signed)
Immediate Anesthesia Transfer of Care Note  Patient: Aaron Jones  Procedure(s) Performed: Procedure(s): Extraction of tooth #'s 2,3,4,5,14,15,17,21,22,23,24,25,26,27,32 wioth alveoloplasty and bialteral fibrous tuberosity reductions. (N/A)  Patient Location: PACU  Anesthesia Type:General  Level of Consciousness: awake, alert , oriented and patient cooperative  Airway & Oxygen Therapy: Patient Spontanous Breathing and Patient connected to nasal cannula oxygen  Post-op Assessment: Report given to PACU RN, Post -op Vital signs reviewed and unstable, Anesthesiologist notified and Patient moving all extremities  Post vital signs: unstable  Complications: unexpected post-op hospitalizationTachycardia. Dr. Acey Lav here. IV meds by PACU nurses. EKG. X 2.

## 2012-12-11 NOTE — Anesthesia Postprocedure Evaluation (Signed)
Anesthesia Post Note  Patient: Aaron Jones  Procedure(s) Performed: Procedure(s) (LRB): Extraction of tooth #'s 2,3,4,5,14,15,17,21,22,23,24,25,26,27,32 wioth alveoloplasty and bialteral fibrous tuberosity reductions. (N/A)  Anesthesia type: General  Patient location: PACU  Post pain: Pain level controlled  Post assessment: Post-op Vital signs reviewed  Last Vitals: BP 139/68  Pulse 89  Temp(Src) 36.7 C (Oral)  Resp 20  SpO2 100%  Post vital signs: Reviewed  Level of consciousness: Awake, alert  PACU course: Shortly after arriving to PACU pt developed afib with RVR. Pt diaphoretic and SOB. Given diltiazem load and started on infusion. Rate normalized and pt intermittently in SR. Hemodynamically stable throughout. Possibly due to epinephrine in local injected at end of case.  Complications: No apparent anesthesia complications

## 2012-12-11 NOTE — H&P (Signed)
[facility] History and Physical  Aaron Jones JYN:[id number] DOB: August 02, 1945 DOA: 12/11/2012  Referring physician: Dr Kristin Bruins PCP: Elmo Putt, MD  Specialists: Dr Kristin Bruins.   Chief Complaint: Reason for admission A fib RVR.   HPI: Aaron Jones is a 67 y.o. male with past medical history most significant for A trial fibrillation, VSD,  diabetes, hypertension, hypercholesterolemia who presents for elective dental surgery in preparation for VSD closure. Patient develop post operative A fib with RVR. Patient received esmolol, and IV Cardizem. His HR increase up to 140. Hr has decrease to 90 with IV Cardizem.  Patient denies SOB or chest pain. Conversation is limit due to recent surgery. Patient was seen in PACU.    Review of Systems: Negative except as per HPI.   Past Medical History  Diagnosis Date  . Hypertension   . Shortness of breath   . Sleep apnea   . Diabetes mellitus without complication   . GERD (gastroesophageal reflux disease)   . Hyperlipidemia   . Tachycardia, paroxysmal May 2013    Possible SVT  . Abnormal heart rhythms 11/2012    A-Fib  . Heart murmur     from birth   Past Surgical History  Procedure Laterality Date  . Nasal septum surgery  2004  . Tee without cardioversion N/A 11/21/2012    Procedure: TRANSESOPHAGEAL ECHOCARDIOGRAM (TEE);  Surgeon: Dolores Patty, MD;  Location: [facility] ENDOSCOPY;  Service: Cardiovascular;  Laterality: N/A;  . Cardiac catheterization  > 5 yr ago    Done at [employer], reportedly clean  . Cardiac catheterization  11/2012   Social History:  reports that he has never smoked. He has never used smokeless tobacco. He reports that he does not drink alcohol or use illicit drugs.   Allergies  Allergen Reactions  . Biaxin (Clarithromycin) Nausea Only    Family History: non contributory.   Prior to Admission medications   Medication Sig Start Date End Date Taking? Authorizing Provider  acetaminophen (TYLENOL) 500 MG tablet  Take 500 mg by mouth every 6 (six) hours as needed for pain.   Yes Historical Provider, MD  atorvastatin (LIPITOR) 20 MG tablet Take 20 mg by mouth at bedtime.   Yes Historical Provider, MD  diltiazem (CARDIZEM CD) 360 MG 24 hr capsule Take 360 mg by mouth every morning. 11/25/12  Yes Rhonda G Barrett, PA-C  furosemide (LASIX) 40 MG tablet Take 40 mg by mouth every morning. 11/25/12  Yes Rhonda G Barrett, PA-C  insulin glargine (LANTUS) 100 units/mL SOLN Inject 18 Units into the skin daily at 10 pm. 11/25/12  Yes Rhonda G Barrett, PA-C  metFORMIN (GLUCOPHAGE-XR) 500 MG 24 hr tablet Take 1,000 mg by mouth 2 (two) times daily.   Yes Historical Provider, MD  metoprolol (LOPRESSOR) 50 MG tablet Take 75 mg by mouth 2 (two) times daily. 11/25/12  Yes Rhonda G Barrett, PA-C  omeprazole (PRILOSEC) 40 MG capsule Take 40 mg by mouth daily.   Yes Historical Provider, MD  potassium chloride SA (K-DUR,KLOR-CON) 20 MEQ tablet Take 20 mEq by mouth daily. 11/25/12  Yes Rhonda G Barrett, PA-C  apixaban (ELIQUIS) 5 MG TABS tablet Take 5 mg by mouth 2 (two) times daily. 11/25/12   Joline Salt Barrett, PA-C  aspirin EC 81 MG tablet Take 81 mg by mouth every morning.     Historical Provider, MD  Glucose Blood (BLOOD GLUCOSE TEST STRIPS) STRP 1 strip by In Vitro route 2 (two) times daily. 11/25/12   Lewayne Bunting,  MD  Insulin Pen Needle 31G X 6 MM MISC 18 Units by Does not apply route at bedtime. 11/25/12   Darrol Jump, PA-C   Physical Exam: Filed Vitals:   12/11/12 1415 12/11/12 1420 12/11/12 1425 12/11/12 1430  BP: 145/84 131/84 144/81 149/74  Pulse: 90 93 98 90  Temp:      TempSrc:      Resp: 14 12 24 15   SpO2: 100% 100% 100% 100%   General Appearance:    Alert, cooperative, no distress, appears stated age  Head:    Normocephalic, without obvious abnormality, atraumatic  Eyes:    PERRL, conjunctiva/corneas clear,         Nose:   Nares normal, septum midline, mucosa normal, no drainage    or sinus tenderness   Throat:   mouth with [name].   Neck:   Supple, symmetrical, trachea midline, no adenopathy;       thyroid:  No enlargement/tenderness/nodules; no carotid   bruit or JVD  Back:     Symmetric, no curvature, ROM normal, no CVA tenderness  Lungs:     Clear to auscultation bilaterally, respirations unlabored  Chest wall:    No tenderness or deformity  Heart:    Regular rate and rhythm, S1 and S2 normal, no murmur, rub   or gallop  Abdomen:     Soft, non-tender, bowel sounds active all four quadrants,    no masses, no organomegaly        Extremities:   Extremities normal, atraumatic, no cyanosis or edema  Pulses:   2+ and symmetric all extremities  Skin:   Skin color, texture, turgor normal, no rashes or lesions  Lymph nodes:   Cervical, supraclavicular, and axillary nodes normal  Neurologic:   CNII-XII intact. Normal strength, sensation and reflexes      throughout      Labs on Admission:  Basic Metabolic Panel:  Recent Labs Lab 12/09/12 1345  NA 139  K 4.1  CL 103  CO2 30  GLUCOSE 83  BUN 17  CREATININE 0.77  CALCIUM 9.5   Liver Function Tests: No results found for this basename: AST, ALT, ALKPHOS, BILITOT, PROT, ALBUMIN,  in the last 168 hours No results found for this basename: LIPASE, AMYLASE,  in the last 168 hours No results found for this basename: AMMONIA,  in the last 168 hours CBC:  Recent Labs Lab 12/09/12 1345  WBC 8.0  HGB 14.0  HCT 42.2  MCV 92.5  PLT 237   Cardiac Enzymes: No results found for this basename: CKTOTAL, CKMB, CKMBINDEX, TROPONINI,  in the last 168 hours  BNP (last 3 results) No results found for this basename: PROBNP,  in the last 8760 hours CBG:  Recent Labs Lab 12/11/12 0816 12/11/12 0946 12/11/12 1416  GLUCAP 100* 106* 147*    Radiological Exams on Admission: No results found.  EKG: Independently reviewed. A fib RVR.   Assessment/Plan Active Problems:   * No active hospital problems. *  1-A fib with RVR:  exacerbated post surgery. Patient relates he took his Cardizem and metoprolol this morning. He respond to IV Cardizem. Continue with IV Cardizem. Added PRN lopressor. Will hold anticoagulation due to recent surgery. Cycle cardiac enzymes. Patient with multiple cardiac diseases, VSD, Cardiomyopathy, I inform cardiologist of admission.   2-Diabetes: Hold Lantus due to decrease oral intake. Will order SSI. Hold metformin while in the hospital.  3-Cardiomyopathy: Appears compensated. Hold lasix due to recent surgery. Will consider resume lasix in  am.  4-Multiple tooth extraction: Continue with Unasyn to cover for sinus infection and post surgery. Transition to oral Augmentin for total of 10 days when tolerating oral. Amicar oral rinse.  5-History of VSD.  6-DVT Prophylaxis: SCD.    Code Status: Presume Full Code.  Family Communication: Care discussed with Patient.  Disposition Plan: Expect 3 to 4 days inpatient.   Time spent: 75 minutes.   Serenitee Fuertes [facility] Pager [phone]  If 7PM-7AM, please contact night-coverage [email or web address] Password [facility] 12/11/2012, 2:39 PM

## 2012-12-12 ENCOUNTER — Encounter (HOSPITAL_COMMUNITY): Payer: Self-pay | Admitting: Dentistry

## 2012-12-12 DIAGNOSIS — K08109 Complete loss of teeth, unspecified cause, unspecified class: Secondary | ICD-10-CM

## 2012-12-12 DIAGNOSIS — IMO0002 Reserved for concepts with insufficient information to code with codable children: Secondary | ICD-10-CM

## 2012-12-12 LAB — TROPONIN I: Troponin I: <0.3 ng/mL (ref <0.30)

## 2012-12-12 LAB — GLUCOSE, CAPILLARY
Glucose-Capillary: 163 mg/dL — ABNORMAL HIGH (ref 70–99)
Glucose-Capillary: 143 mg/dL — ABNORMAL HIGH (ref 70–99)

## 2012-12-12 MED ORDER — FUROSEMIDE 40 MG PO TABS
40.0000 mg | ORAL_TABLET | Freq: Every day | ORAL | Status: DC
  Administered 2012-12-12: 40 mg via ORAL
  Filled 2012-12-12: qty 1

## 2012-12-12 MED ORDER — PANTOPRAZOLE SODIUM 40 MG PO TBEC
40.0000 mg | DELAYED_RELEASE_TABLET | Freq: Every day | ORAL | Status: DC
  Administered 2012-12-12: 40 mg via ORAL
  Filled 2012-12-12: qty 1

## 2012-12-12 MED ORDER — AMOXICILLIN-POT CLAVULANATE 875-125 MG PO TABS
1.0000 | ORAL_TABLET | Freq: Two times a day (BID) | ORAL | Status: DC
  Administered 2012-12-12: 1 via ORAL
  Filled 2012-12-12 (×2): qty 1

## 2012-12-12 MED ORDER — DILTIAZEM HCL ER COATED BEADS 360 MG PO CP24
360.0000 mg | ORAL_CAPSULE | Freq: Every day | ORAL | Status: DC
  Administered 2012-12-12: 360 mg via ORAL
  Filled 2012-12-12: qty 1

## 2012-12-12 MED ORDER — SODIUM CHLORIDE 0.9 % IR SOLN
200.0000 mL | Status: DC
  Administered 2012-12-12: 200 mL

## 2012-12-12 MED ORDER — OXYCODONE-ACETAMINOPHEN 5-325 MG PO TABS: 1.0000 | ORAL_TABLET | ORAL | Status: DC | PRN

## 2012-12-12 MED ORDER — AMOXICILLIN-POT CLAVULANATE 875-125 MG PO TABS: 1.0000 | ORAL_TABLET | Freq: Two times a day (BID) | ORAL | Status: DC

## 2012-12-12 MED ORDER — ATORVASTATIN CALCIUM 20 MG PO TABS
20.0000 mg | ORAL_TABLET | Freq: Every day | ORAL | Status: DC
  Filled 2012-12-12: qty 1

## 2012-12-12 MED ORDER — POTASSIUM CHLORIDE CRYS ER 20 MEQ PO TBCR
20.0000 meq | EXTENDED_RELEASE_TABLET | Freq: Every day | ORAL | Status: DC
  Administered 2012-12-12: 20 meq via ORAL
  Filled 2012-12-12: qty 1

## 2012-12-12 MED ORDER — METOPROLOL TARTRATE 50 MG PO TABS
75.0000 mg | ORAL_TABLET | Freq: Two times a day (BID) | ORAL | Status: DC
  Administered 2012-12-12: 75 mg via ORAL
  Filled 2012-12-12 (×2): qty 1

## 2012-12-12 NOTE — Progress Notes (Signed)
TELEMETRY: Reviewed telemetry pt in afib with controlled VR on IV diltiazem: Filed Vitals:   12/12/12 0000 12/12/12 0400 12/12/12 0730 12/12/12 0800  BP: 132/60 143/51 144/96   Pulse: 92 90 91   Temp: 98.6 F (37 C) 99 F (37.2 C)  99.6 F (37.6 C)  TempSrc: Axillary Axillary  Axillary  Resp: 16 15 19    Height:      Weight:      SpO2: 98% 100% 96%     Intake/Output Summary (Last 24 hours) at 12/12/12 1013 Last data filed at 12/12/12 0800  Gross per 24 hour  Intake 3604.25 ml  Output    530 ml  Net 3074.25 ml    SUBJECTIVE Feels well other than sore mouth. No SOB or chest pain.  LABS: Basic Metabolic Panel:  Recent Labs  16/10/96 1345 12/11/12 1621  NA 139 139  K 4.1 4.2  CL 103 103  CO2 30 24  GLUCOSE 83 160*  BUN 17 15  CREATININE 0.77 0.75  CALCIUM 9.5 8.8  MG  --  1.8   Liver Function Tests:  Recent Labs  12/11/12 1621  AST 18  ALT 13  ALKPHOS 57  BILITOT 0.4  PROT 6.7  ALBUMIN 3.3*   No results found for this basename: LIPASE, AMYLASE,  in the last 72 hours CBC:  Recent Labs  12/09/12 1345 12/11/12 1621  WBC 8.0 14.9*  HGB 14.0 14.2  HCT 42.2 42.3  MCV 92.5 93.8  PLT 237 208   Cardiac Enzymes:  Recent Labs  12/11/12 1412 12/11/12 2015 12/12/12 0150  CKTOTAL 61  --   --   CKMB 2.2  --   --   TROPONINI <0.30 <0.30 <0.30   Radiology/Studies:  Dg Chest 2 View  12/09/2012   *RADIOLOGY REPORT*  Clinical Data: preop radiograph  CHEST - 2 VIEW  Comparison: 11/18/2012  Findings: There is moderate cardiac enlargement and pulmonary vascular congestion.  This is unchanged from previous exam.  No pleural effusion or interstitial edema.  There is mild spondylosis within the thoracic spine.  No airspace consolidation.  IMPRESSION: Cardiac enlargement and pulmonary venous congestion.   Original Report Authenticated By: Signa Kell, M.D.     PHYSICAL EXAM General: Well developed, well nourished, in no acute distress. Head: Mouth swollen.  Mild clots. Neck: Negative for carotid bruits. JVD not elevated. Lungs: Clear bilaterally to auscultation without wheezes, rales, or rhonchi. Breathing is unlabored. Heart: IRRR S1 S2 with 3/6 harsh systolic murmur LSB. Abdomen: Soft, non-tender, non-distended with normoactive bowel sounds. No hepatomegaly. No rebound/guarding. No obvious abdominal masses. Msk:  Strength and tone appears normal for age. Extremities: No clubbing, cyanosis or edema.  Distal pedal pulses are 2+ and equal bilaterally. Neuro: Alert and oriented X 3. Moves all extremities spontaneously. Psych:  Responds to questions appropriately with a normal affect.  ASSESSMENT AND PLAN: 1. Atrial fibrillation. Rate well controlled on IV dilitiazem. Previously well controlled on oral metoprolol 75 mg bid and cardizem CD 360 mg daily. Will resume oral meds and wean off IV diltiazem. Plan to resume Eliquis in 3-4 days per Dr. Kristin Bruins. OK for DC from cardiac standpoint once off IV diltiazem.  2. Diastolic CHF chronic. Resume prior lasix dose. 3. 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 Plan VSD closure with Maze procedure with Dr. Tyrone Sage.  Active Problems:   Atrial fibrillation with rapid ventricular response   Essential hypertension, benign   Type II or unspecified type diabetes mellitus without mention of complication, not  stated as uncontrolled   Mild Pulmonary HTN    Aaron Jones [place] MD,FACC 12/12/2012 10:19 AM

## 2012-12-12 NOTE — Progress Notes (Signed)
POST OPERATIVE NOTE:  12/12/2012 Dollene Cleveland [id number]  VITALS: BP 143/51  Pulse 90  Temp(Src) 99 F (37.2 C) (Axillary)  Resp 15  Ht 5\' 11"  (1.803 m)  Wt 224 lb 6.9 oz (101.8 kg)  BMI 31.32 kg/m2  SpO2 100%  Lab Results  Component Value Date   WBC 14.9* 12/11/2012   HGB 14.2 12/11/2012   HCT 42.3 12/11/2012   MCV 93.8 12/11/2012   PLT 208 12/11/2012   BMET    Component Value Date/Time   NA 139 12/11/2012 1621   K 4.2 12/11/2012 1621   CL 103 12/11/2012 1621   CO2 24 12/11/2012 1621   GLUCOSE 160* 12/11/2012 1621   BUN 15 12/11/2012 1621   CREATININE 0.75 12/11/2012 1621   CALCIUM 8.8 12/11/2012 1621   GFRNONAA >90 12/11/2012 1621   GFRAA >90 12/11/2012 1621     Dollene Cleveland is status post extraction of remaining teeth with alveoloplasty and pre-prosthetic surgery as indicated. The patient experienced an episode of atrial fibrillation with rapid ventricular response in PACU and an admission for observation was requested by the anesthesia team.  Patient was admitted by Dr. Sunnie Nielsen with hospitalist team.  SUBJECTIVE: Patient is complaining of discomfort from the dental extractions sites. Patient with no significant bleeding by his report.  Patient does have bilateral facial swelling and insists he used ice packs as instructed.  EXAM: Significant bilateral facial swelling noted. Intraoral with minimal swelling and ecchymosis. Clots are present. Minimal heme and noted. Sutures are intact.  ASSESSMENT: Dental post operative course is consistent with dental procedures performed in the operating room. Patient informed of upper left floor of sinus involvement and will be kept on antibiotic therapy. Currently IV Unasyn with switch to oral Augmentin at discharge for one week.  PLAN: 1. Continue to hold Eliquis anticoagulant therapy for additional 3-4 days. 2. Will start gentle salt water rinses every two hours while awake. 3. Continue Unasyn IV antibiotic therapy with switch to oral Augmentin  once able to handle po meds. 4. NO SUCKING ON STRAWS 5. NO vigorous swishing or spitting  6. Avoid blowing his nose or coughing of possible 7. Advance diet as tolerated with Dietary consult pending. 8. Return to dental clinic in approximately 7-10 days for evaluation of healing. 9. OK for discharge from dental standpoint. Patient to call problems arise before then.    Charlynne Pander, DDS

## 2012-12-12 NOTE — Discharge Summary (Signed)
Physician Discharge Summary  Aaron Jones AOZ:[id number] DOB: 03-10-1946 DOA: 12/11/2012  PCP: Elmo Putt, MD  Admit date: 12/11/2012 Discharge date: 12/12/2012  Time spent: 35 minutes  Recommendations for Outpatient Follow-up:  1. Follow up with Dr Kristin Bruins for further cares post dental extraction and to resume eliquis.   Discharge Diagnoses:    Atrial fibrillation with rapid ventricular response   Multiple Dental  extraction.    Essential hypertension, benign   Type II or unspecified type diabetes mellitus without mention of complication, not stated as uncontrolled   Mild Pulmonary HTN   Discharge Condition: Stable  Diet recommendation: Carb modified, mechanical soft.   Filed Weights   12/11/12 1536  Weight: 101.8 kg (224 lb 6.9 oz)    History of present illness:  Aaron Jones is a 67 y.o. male with past medical history most significant for A trial fibrillation, VSD, diabetes, hypertension, hypercholesterolemia who presents for elective dental surgery in preparation for VSD closure. Patient develop post operative A fib with RVR. Patient received esmolol, and IV Cardizem. His HR increase up to 140. Hr has decrease to 90 with IV Cardizem. Patient denies SOB or chest pain. Conversation is limit due to recent surgery. Patient was seen in PACU.    Hospital Course:  1-A fib with RVR: exacerbated post surgery. Marland Kitchen He respond to IV Cardizem. Continue with IV Cardizem. Added PRN lopressor. Will hold anticoagulation due to recent surgery. Cycle cardiac enzymes times 3 negative. Patient with multiple cardiac diseases, VSD, Cardiomyopathy, I inform cardiologist of admission. IV Cardizem was transition to home oral regimen. HR stable, patient tolerating diet. Plan to discharge today.   2-Diabetes: . Will order SSI. Resume home medication. Patient tolerating diet.  3-Cardiomyopathy: Appears compensated. Hold lasix due to recent surgery. Will resume lasix today.  4-Multiple dental extraction:  started on Unasyn to cover for sinus infection and post surgery. Transition to oral Augmentin for total of 7 days when tolerating oral. Amicar oral rinse.  5-History of VSD.  6-DVT Prophylaxis: SCD.    Procedures:  Multiple dental extraction.   Consultations:  Cardiologist  Discharge Exam: Filed Vitals:   12/12/12 0000 12/12/12 0400 12/12/12 0730 12/12/12 0800  BP: 132/60 143/51 144/96   Pulse: 92 90 91   Temp: 98.6 F (37 C) 99 F (37.2 C)  99.6 F (37.6 C)  TempSrc: Axillary Axillary  Axillary  Resp: 16 15 19    Height:      Weight:      SpO2: 98% 100% 96%     General: No distress.  Cardiovascular: S 1, S 2 IRR Respiratory: CTA  Discharge Instructions  Discharge Orders   Future Appointments Provider Department Dept Phone   12/26/2012 10:15 AM Delight Ovens, MD [facility] [phone]   12/30/2012 10:45 AM Lewayne Bunting, MD [facility] Main Office [place]) [phone]   Future Orders Complete By Expires     Diet - low sodium heart healthy  As directed     Increase activity slowly  As directed         Medication List    STOP taking these medications       apixaban 5 MG Tabs tablet  Commonly known as:  ELIQUIS     aspirin EC 81 MG tablet     Insulin Pen Needle 31G X 6 MM Misc      TAKE these medications       acetaminophen 500 MG tablet  Commonly known as:  TYLENOL  Take 500 mg by mouth every 6 (six) hours as needed for pain.     amoxicillin-clavulanate 875-125 MG per tablet  Commonly known as:  AUGMENTIN  Take 1 tablet by mouth every 12 (twelve) hours.     atorvastatin 20 MG tablet  Commonly known as:  LIPITOR  Take 20 mg by mouth at bedtime.     BLOOD GLUCOSE TEST STRIPS Strp  1 strip by In Vitro route 2 (two) times daily.     diltiazem 360 MG 24 hr capsule  Commonly known as:  CARDIZEM CD  Take 360 mg by mouth every morning.     furosemide 40 MG tablet  Commonly known as:  LASIX   Take 40 mg by mouth every morning.     insulin glargine 100 units/mL Soln  Commonly known as:  LANTUS  Inject 18 Units into the skin daily at 10 pm.     metFORMIN 500 MG 24 hr tablet  Commonly known as:  GLUCOPHAGE-XR  Take 1,000 mg by mouth 2 (two) times daily.     metoprolol 50 MG tablet  Commonly known as:  LOPRESSOR  Take 75 mg by mouth 2 (two) times daily.     omeprazole 40 MG capsule  Commonly known as:  PRILOSEC  Take 40 mg by mouth daily.     oxyCODONE-acetaminophen 5-325 MG per tablet  Commonly known as:  PERCOCET/ROXICET  Take 1-2 tablets by mouth every 4 (four) hours as needed.     potassium chloride SA 20 MEQ tablet  Commonly known as:  K-DUR,KLOR-CON  Take 20 mEq by mouth daily.       Allergies  Allergen Reactions  . Biaxin (Clarithromycin) Nausea Only       Follow-up Information   Schedule an appointment as soon as possible for a visit with Charlynne Pander, DDS.   Contact information:   [address] [place] [address] [phone]        The results of significant diagnostics from this hospitalization (including imaging, microbiology, ancillary and laboratory) are listed below for reference.    Significant Diagnostic Studies: Dg Orthopantogram  11/22/2012   *RADIOLOGY REPORT*  Clinical Data: Dental disease, preop cardiac surgery.  ORTHOPANTOGRAM/PANORAMIC  Comparison: None.  Findings: Multiple missing teeth and restorations.  Caries involves teeth numbers 25, 26, and 31.  No periapical lucency to suggest abscess. Mandible intact.  Temporomandibular joints appear seated.  IMPRESSION:  Dental restorations, missing teeth, and caries.  Recommend dental films for complete evaluation.   Original Report Authenticated By: D. Andria Rhein, MD   Dg Chest 2 View  12/09/2012   *RADIOLOGY REPORT*  Clinical Data: preop radiograph  CHEST - 2 VIEW  Comparison: 11/18/2012  Findings: There is moderate cardiac enlargement and pulmonary vascular congestion.   This is unchanged from previous exam.  No pleural effusion or interstitial edema.  There is mild spondylosis within the thoracic spine.  No airspace consolidation.  IMPRESSION: Cardiac enlargement and pulmonary venous congestion.   Original Report Authenticated By: Signa Kell, M.D.   X-ray Chest Pa And Lateral   11/18/2012   *RADIOLOGY REPORT*  Clinical Data: Short of breath  CHEST - 2 VIEW  Comparison: None.  Findings: Heart silhouette is enlarged.  There is central venous pulmonary congestion.  Small bilateral pleural effusions.  No focal infiltrate.  No overt pulmonary edema. No acute osseous abnormality.  IMPRESSION: Cardiomegaly, central venous congestion, and small effusions.   Original Report Authenticated By: Genevive Bi, M.D.    Microbiology:  No results found for this or any previous visit (from the past 240 hour(s)).   Labs: Basic Metabolic Panel:  Recent Labs Lab 12/09/12 1345 12/11/12 1621  NA 139 139  K 4.1 4.2  CL 103 103  CO2 30 24  GLUCOSE 83 160*  BUN 17 15  CREATININE 0.77 0.75  CALCIUM 9.5 8.8  MG  --  1.8   Liver Function Tests:  Recent Labs Lab 12/11/12 1621  AST 18  ALT 13  ALKPHOS 57  BILITOT 0.4  PROT 6.7  ALBUMIN 3.3*   No results found for this basename: LIPASE, AMYLASE,  in the last 168 hours No results found for this basename: AMMONIA,  in the last 168 hours CBC:  Recent Labs Lab 12/09/12 1345 12/11/12 1621  WBC 8.0 14.9*  HGB 14.0 14.2  HCT 42.2 42.3  MCV 92.5 93.8  PLT 237 208   Cardiac Enzymes:  Recent Labs Lab 12/11/12 1412 12/11/12 2015 12/12/12 0150  CKTOTAL 61  --   --   CKMB 2.2  --   --   TROPONINI <0.30 <0.30 <0.30   BNP: BNP (last 3 results) No results found for this basename: PROBNP,  in the last 8760 hours CBG:  Recent Labs Lab 12/11/12 1416 12/11/12 1705 12/11/12 2148 12/12/12 0813 12/12/12 1202  GLUCAP 147* 156* 163* 143* 226*       Signed:  REGALADO,BELKYS  [facility] 12/12/2012, 1:37 PM

## 2012-12-12 NOTE — Progress Notes (Signed)
Nutrition Education Note  Body mass index is 31.32 kg/(m^2). Patient meets criteria for class I obesity based on current BMI.   Pt s/p multiple teeth extraction yesterday. Met with pt to discuss post-op diet. Discussed importance of not using straws and consuming well cooked, softened, and small sizes of food. Encouraged pt to consume protein shakes if he feels appetite poor after surgery, if pt loses weight, or if pt having difficulty getting adequate protein intake from food. Discouraged intake of dry foods, raw vegetables, and hard/tough foods. Handouts provided, teach back method used. RD contact information provided. Expect good compliance.   Current diet order is dysphagia 3, no meals have been ordered yet. Labs and medications reviewed.   No nutrition interventions warranted at this time. If nutrition issues arise, please consult RD.   Levon Hedger MS, RD, LDN [phone] Pager [phone] After Hours Pager

## 2012-12-20 ENCOUNTER — Telehealth: Payer: Self-pay | Admitting: Cardiology

## 2012-12-20 NOTE — Telephone Encounter (Signed)
rec'd records from [facility]. Forward 2 pages to [facility]

## 2012-12-23 ENCOUNTER — Ambulatory Visit (HOSPITAL_COMMUNITY): Payer: Self-pay | Admitting: Dentistry

## 2012-12-23 ENCOUNTER — Encounter (HOSPITAL_COMMUNITY): Payer: Self-pay | Admitting: Dentistry

## 2012-12-23 VITALS — BP 138/76 | HR 87 | Temp 98.3°F

## 2012-12-23 DIAGNOSIS — K08109 Complete loss of teeth, unspecified cause, unspecified class: Secondary | ICD-10-CM

## 2012-12-23 DIAGNOSIS — Z0189 Encounter for other specified special examinations: Secondary | ICD-10-CM

## 2012-12-23 DIAGNOSIS — Q210 Ventricular septal defect: Secondary | ICD-10-CM

## 2012-12-23 DIAGNOSIS — K08409 Partial loss of teeth, unspecified cause, unspecified class: Secondary | ICD-10-CM

## 2012-12-23 NOTE — Patient Instructions (Signed)
PLAN: 1. Continue salt water rinses every 2 hours while awake to aid healing. 2. Continue avoiding sneezing or coughing if possible. Call if problems with sinuses arises. 3. Patient is now cleared for heart surgery with Dr. Tyrone Sage. 4. Followup with primary dentist of choice for fabrication of upper and lower complete dentures after adequate healing and once medically stable from the anticipated heart surgery. 5. Call if records are needed to be sent to new primary dentist after release of information is completed. 6. Call for followup evaluation by dental medicine once date of surgery as known.  Charlynne Pander, DDS

## 2012-12-23 NOTE — Progress Notes (Signed)
POST OPERATIVE NOTE:  12/23/2012 Aaron Jones [id number]  VITALS: BP 138/76  Pulse 87  Temp(Src) 98.3 F (36.8 C) (Oral)  Patient is status post multiple extractions with alveoloplasty and pre-prosthetic surgery as indicated on 12/11/2012. Patient now presents for evaluation of healing and suture removal as indicated. I will also evaluate for possible sinus involvement with previous extractions.  SUBJECTIVE: Patient denies having any significant dental pain or discomfort at this time. Patient initially had significant extraoral and intraoral swelling of the procedure. This has resolved over time. The pateint denies having any problems with his sinuses especially the upper left sinus.  Patient indicates that sutures are still in place.Aaron Jones  EXAM: There is no sign of infection, heme, or ooze. Generalized primary closure is noted. There is no evidence of oral-antral fistula.  Sutures remain loosely. There is atrophy of the edentulous alveolar ridges noted.  ASSESSMENT: Post operative course is consistent with dental procedures performed in the OR with maxillary left sinus involvement at time of surgery but no apparent post operative complications.   PLAN: 1. Continue salt water rinses every 2 hours while awake to aid healing. 2. Continue avoiding sneezing or coughing if possible. Call if problems with sinuses arises. 3. Patient is now cleared for heart surgery with Dr. Tyrone Sage. 4. Followup with primary dentist of choice for fabrication of upper and lower complete dentures after adequate healing and once medically stable from the anticipated heart surgery. 5. Call if records are needed to be sent to new primary dentist after release of information is completed. 6. Call for followup evaluation by dental medicine once date of surgery as known.   Charlynne Pander, DDS

## 2012-12-26 ENCOUNTER — Other Ambulatory Visit: Payer: Self-pay | Admitting: *Deleted

## 2012-12-26 ENCOUNTER — Telehealth: Payer: Self-pay | Admitting: Cardiology

## 2012-12-26 ENCOUNTER — Encounter: Payer: Self-pay | Admitting: Cardiothoracic Surgery

## 2012-12-26 ENCOUNTER — Ambulatory Visit (INDEPENDENT_AMBULATORY_CARE_PROVIDER_SITE_OTHER): Payer: Managed Care, Other (non HMO) | Admitting: Cardiothoracic Surgery

## 2012-12-26 VITALS — BP 128/70 | HR 64 | Resp 16 | Ht 72.0 in | Wt 211.0 lb

## 2012-12-26 DIAGNOSIS — I2510 Atherosclerotic heart disease of native coronary artery without angina pectoris: Secondary | ICD-10-CM

## 2012-12-26 DIAGNOSIS — I4891 Unspecified atrial fibrillation: Secondary | ICD-10-CM

## 2012-12-26 DIAGNOSIS — Q210 Ventricular septal defect: Secondary | ICD-10-CM

## 2012-12-26 NOTE — Patient Instructions (Addendum)
Stop apixaban  5 days before surgery Stop ASA 7 days before  Stop metformin 36 hours before surgery

## 2012-12-26 NOTE — Progress Notes (Signed)
Patient ID: Aaron Jones, male   DOB: [birth year], 67 y.o.   MRN: [id number]      [address]       [place] [address]             [phone]        [facility] Medical Record #[id number] Date of Birth: [birth year]  Referring: Dr Velta Addison Primary Care: Elmo Putt, MD  Chief Complaint:   Admitted with rapid afib new onset   History of Present Illness:     67 y.o. male with no history of CAD History of diabetes, hypertension, hypercholesterolemia. History of  tachycardia in 2013, possible SVT started  Cardizem. Follows with cardiologist, Dr. Lady Gary in [place].  In June weeks  he noticed he would become sob with climbing stairs and with doing yard work.  Symptoms improved with rest.  He has noticed increased ankle swelling and onset of PND. Symptoms progressed he developed a tightening sensation around his chest and abdomen along with some muscle cramping In adductors with increased activity. The day of admission the tightening sensation in his chest and abdomen progressed to a heavy pressure that he rated a 6/10. He went to urgent care and then was sent to [facility], and [facility] and admitted on the hospitalist service .Patient reports that his pain  resolved as soon as he received cardizem. He was found to be in rapid AF  At this time the patient is comfortable without chest pain or SOB   Current Activity/ Functional Status: Patient is independent with mobility/ambulation, transfers, ADL's, IADL's.   Zubrod Score: At the time of surgery this patient's most appropriate activity status/level should be described as: []  Normal activity, no symptoms [x]  Symptoms, fully ambulatory []  Symptoms, in bed less than or equal to 50% of the time []  Symptoms, in bed greater than 50% of the time but less than 100% []  Bedridden []  Moribund  Past Medical History  Diagnosis Date  . Hypertension   . Shortness of breath   . Sleep apnea   .  Diabetes mellitus without complication   . GERD (gastroesophageal reflux disease)   . Hyperlipidemia   . Tachycardia, paroxysmal May 2013    Possible SVT  . Abnormal heart rhythms 11/2012    A-Fib  . Heart murmur     from birth    Past Surgical History  Procedure Laterality Date  . Nasal septum surgery  2004  . Tee without cardioversion N/A 11/21/2012    Procedure: TRANSESOPHAGEAL ECHOCARDIOGRAM (TEE);  Surgeon: Dolores Patty, MD;  Location: [facility] ENDOSCOPY;  Service: Cardiovascular;  Laterality: N/A;  . Cardiac catheterization  > 5 yr ago    Done at [facility], reportedly clean  . Cardiac catheterization  11/2012  . Multiple extractions with alveoloplasty N/A 12/11/2012    Procedure: Extraction of tooth #'s 2,3,4,5,14,15,17,21,22,23,24,25,26,27,32 wioth alveoloplasty and bialteral fibrous tuberosity reductions.;  Surgeon: Charlynne Pander, DDS;  Location: [facility] ORS;  Service: Oral Surgery;  Laterality: N/A;    History  Smoking status  . Never Smoker   Smokeless tobacco  . Never Used    History  Alcohol Use No    History   Social History  . Marital Status: Married    Spouse Name: N/A    Number of Children: N/A  . 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  Smoking status: Never Smoker   . Smokeless tobacco: Never Used  . Alcohol Use: No  . Drug Use: No  . Sexually Active: No   Other Topics Concern  . Not on file   Social History Narrative   Still works full time. Works around the yard. Lives with wife. 2 sisters, neither with cardiac issues.    Allergies  Allergen Reactions  . Biaxin (Clarithromycin) Nausea Only    Current Outpatient Prescriptions  Medication Sig Dispense Refill  . acetaminophen (TYLENOL) 500 MG tablet Take 500 mg by mouth every 6 (six) hours as needed for pain.      Marland Kitchen atorvastatin (LIPITOR) 20 MG tablet Take 20 mg by mouth at bedtime.      Marland Kitchen diltiazem (CARDIZEM  CD) 360 MG 24 hr capsule Take 360 mg by mouth every morning.      . furosemide (LASIX) 40 MG tablet Take 40 mg by mouth every morning.      . Glucose Blood (BLOOD GLUCOSE TEST STRIPS) STRP 1 strip by In Vitro route 2 (two) times daily.  100 each  0  . insulin glargine (LANTUS) 100 units/mL SOLN Inject 18 Units into the skin daily at 10 pm.      . metFORMIN (GLUCOPHAGE-XR) 500 MG 24 hr tablet Take 1,000 mg by mouth 2 (two) times daily.      . metoprolol (LOPRESSOR) 50 MG tablet Take 75 mg by mouth 2 (two) times daily.      Marland Kitchen omeprazole (PRILOSEC) 40 MG capsule Take 40 mg by mouth daily.      . potassium chloride SA (K-DUR,KLOR-CON) 20 MEQ tablet Take 20 mEq by mouth daily.       No current facility-administered medications for this visit.     (Not in a hospital admission)  No family history on file.   Review of Systems:     Cardiac Review of Systems: Y or N  Chest Pain [ y when in rapid af Laodice.Slates   ]  Resting SOB [ y  ] Exertional SOB  [ y ]  Pollyann Kennedy Milo.Brash  ]   Pedal Edema [ y  ]    Palpitations Cove.Etienne  ] Syncope  [ n ]   Presyncope [ n  ]  General Review of Systems: [Y] = yes [  ]=no Constitional: recent weight change Cove.Etienne  ]; anorexia [  ]; fatigue Cove.Etienne  ]; nausea [  ]; night sweats [  ]; fever [  ]; or chills [  ]                                                               Dental: poor dentition[ yes ]; Last Dentist visit: several years  Eye : blurred vision [  ]; diplopia [   ]; vision changes [n  ];  Amaurosis fugax[n  ]; Resp: cough [  ];  wheezing[  ];  hemoptysis[  ]; shortness of breath[ y ]; paroxysmal nocturnal dyspnea[y  ]; dyspnea on exertion[ y ]; or orthopnea[  ];  GI:  gallstones[  ], vomiting[  ];  dysphagia[  ]; melena[  ];  hematochezia [  ]; heartburn[  ];   Hx of  Colonoscopy[  ]; GU: kidney stones [  ]; hematuria[  ];  dysuria [  ];  nocturia[  ];  history of     obstruction [n  ]; urinary frequency [ n ]             Skin: rash, swelling[  ];, hair loss[  ];  peripheral  edema[  ];  or itching[  ]; Musculosketetal: myalgias[  ];  joint swelling[  ];  joint erythema[  ];  joint pain[  ];  back pain[  ];  Heme/Lymph: bruising[  ];  bleeding[ n ];  anemia[  ];  Neuro: TIA[n  ];  headaches[  ];  stroke[  ];  vertigo[  ];  seizures[ n ];   paresthesias[ n ];  difficulty walking[  n];  Psych:depression[n  ]; anxiety[n  ];  Endocrine: diabetes[ n ];  thyroid dysfunction[  ];  Immunizations: Flu [  ]; Pneumococcal[  ];  Other:  Physical Exam: BP 129/76  Pulse 80  Resp 20  Ht 6' (1.829 m)  Wt 217 lb (98.431 kg)  BMI 29.42 kg/m2  SpO2 97%  General appearance: alert, cooperative, appears older than stated age and no distress Neurologic: intact Heart: regular rate and rhythm and systolic murmur: holosystolic 3/6, blowing throughout the precordium Lungs: clear to auscultation bilaterally Abdomen: soft, non-tender; bowel sounds normal; no masses,  no organomegaly Extremities: extremities normal, atraumatic, no cyanosis or edema and Homans sign is negative, no sign of DVT No carotid bruits, full femoral, dt and Pt pulses bilaterial  Diagnostic Studies & Laboratory data:     Recent Radiology Findings:  X-ray Chest Pa And Lateral   11/18/2012   *RADIOLOGY REPORT*  Clinical Data: Short of breath  CHEST - 2 VIEW  Comparison: None.  Findings: Heart silhouette is enlarged.  There is central venous pulmonary congestion.  Small bilateral pleural effusions.  No focal infiltrate.  No overt pulmonary edema. No acute osseous abnormality.  IMPRESSION: Cardiomegaly, central venous congestion, and small effusions.   Original Report Authenticated By: Genevive Bi, M.D.     Recent Lab Findings: Lab Results  Component Value Date   WBC 14.9* 12/11/2012   HGB 14.2 12/11/2012   HCT 42.3 12/11/2012   PLT 208 12/11/2012   GLUCOSE 160* 12/11/2012   ALT 13 12/11/2012   AST 18 12/11/2012   NA 139 12/11/2012   K 4.2 12/11/2012   CL 103 12/11/2012   CREATININE 0.75 12/11/2012   BUN 15 12/11/2012    CO2 24 12/11/2012   TSH 1.571 11/17/2012   INR 1.10 11/18/2012   HGBA1C 10.6* 11/17/2012   ZOX:WRUEAVWU:  LEFT VENTRICLE: EF = 50-55%. No regional wall motion abnormalities  INTRAVENTRICULAR SEPTUM: Small to moderate sized perimembranous VSD immediately under AoV with prominent L->R shunting   RIGHT VENTRICLE: Mildly dilated. Mild HK  LEFT ATRIUM: Dilated  LEFT ATRIAL APPENDAGE: No thrombus.  RIGHT ATRIUM: Dilated  AORTIC VALVE: Trileaflet. Mild AI.  MITRAL VALVE: Normal. Mild to moderate MR  TRICUSPID VALVE: Dilated annulus moderate TR  PULMONIC VALVE: Grossly normal. Mild PR  INTERATRIAL SEPTUM: No PFO or ASD.  PERICARDIUM: No effusion  DESCENDING AORTA: Mild plaque   CATH:Cardiac Cath Procedure Note  Indication: CP (in setting of AF with RVR) and VSD  Procedures performed:  1) Right heart cathererization  2) Selective coronary angiography  3) Left heart catheterization  4) Left ventriculogram  5) Shunt run  Description of procedure:   The risks and indication of the procedure were explained. Consent was signed and placed on the chart. An appropriate  timeout was taken prior to the procedure. The right groin was prepped and draped in the routine sterile fashion and anesthetized with 1% local lidocaine.  A 5 FR arterial sheath was placed in the right femoral artery using a modified Seldinger technique. Standard catheters including a JL4, JR4 and angled pigtail were used. All catheter exchanges were made over a wire. A 7 FR venous sheath was placed in the right femoral vein using a modified Seldinger technique. A standard Swan-Ganz catheter was used for the procedure.  Complications: None apparent  Total contrast: 80 cc  Findings:  RA = 19  RV = 45/15/18  PA = 48/24 (34)  PCW = 22 (v = 35)  Fick cardiac output/index = 4.9/2.2  PVR = 1.2 Woods  SVR = 1040  FA sat = 97%  SVC sat = 62%  RA sat = 64%  RV sat = 78%  PA sat = 81%  Qp/QS = 2.1  Ao Pressure: 116/69 (86)  LV  Pressure: 106/13/18  There was no signficant gradient across the aortic valve on pullback.  Left main: Normal  LAD: Mild plaque in proximal portion  Ramus: Very large branching vessel. Normal  LCX: Small vessel. normal  RCA: Dominant vessel. 20% mid. 80-90% hazy lesion in ostium of small to moderate -sized PDA (2.25 mm)  LV-gram done in the RAO projection: Ejection fraction = 45% global HK  Assessment:  1. 1v CAD as described above  2. Significantly elevated R-sided filling pressure  3. VSD with evidence of moderate L -> R shunt by Qp/QS ratio  4. EF 45%  Plan/Discussion:  Based on cath numbers VSD appears hemodynamically significant. Will arrange TEE.  He has significant lesion in small-to-moderate sized PDA. CP occurred during rapid AF but uncommon with exertion. I reviewed with Dr. Clifton James lesion is approachable percutaneously but given lack of significant exertional CP and possible need for CT surgery for VSD will treat medically for now.  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 I can see the patient in the office after dental issues are resolved to further review with him and his wife the planned procedure. Each time I have seen the patient his wife has not been here.       Delight Ovens MD      [address] [place] [address] Office [phone]   Beeper [phone]  11/22/2012 11:10 AM

## 2012-12-26 NOTE — Telephone Encounter (Signed)
Pt came In Today asking for a Copy of the [employer]/FMLA that's was completed & Signed By Winfield Rast He stated Nothing Was Faxed to [email or web address]. I Printed Out the Copy that Ws scanned into Epic and gave to Pt. I also asked if he would like me to Refax this he said No. While here he Dropped Off [employer]/FMLA papers to Be  Completed these have been Phone noted and Will be Sent to [employer] For processing, He said He Needs these completed By July30th, I did Let Pt Know it takes 10-14 Days to Complete. 12/26/12/KM

## 2012-12-29 NOTE — Progress Notes (Signed)
Patient ID: Aaron Jones, male   DOB: 02/10/1946, 67 y.o.   MRN: [id number]      [address]       [place] [address]             [phone]        Nieko Clarin [facility] Medical Record #[id number] Date of Birth: 1946-03-20  Referring: Dr Velta Addison Primary Care: Elmo Putt, MD  Chief Complaint:   Admitted with rapid afib new onset   History of Present Illness:     67 y.o. male with no history of CAD History of diabetes, hypertension, hypercholesterolemia. History of  tachycardia in 2013, possible SVT started  Cardizem. Follows with cardiologist, Dr. Lady Gary in [place].  In June weeks  he noticed he would become sob with climbing stairs and with doing yard work.  Symptoms improved with rest.  He has noticed increased ankle swelling and onset of PND. Symptoms progressed he developed a tightening sensation around his chest and abdomen along with some muscle cramping In adductors with increased activity. The day of admission the tightening sensation in his chest and abdomen progressed to a heavy pressure that he rated a 6/10. He went to urgent care and then was sent to [facility], and transferred to [facility] and admitted on the hospitalist service .Patient reports that his pain  resolved as soon as he received cardizem. He was found to be in rapid AF  At this time the patient is comfortable without chest pain or SOB   Current Activity/ Functional Status: Patient is independent with mobility/ambulation, transfers, ADL's, IADL's.   Zubrod Score: At the time of surgery this patient's most appropriate activity status/level should be described as: []  Normal activity, no symptoms [x]  Symptoms, fully ambulatory []  Symptoms, in bed less than or equal to 50% of the time []  Symptoms, in bed greater than 50% of the time but less than 100% []  Bedridden []  Moribund  Past Medical History  Diagnosis Date  . Hypertension   . Shortness of breath   . Sleep apnea   .  Diabetes mellitus without complication   . GERD (gastroesophageal reflux disease)   . Hyperlipidemia   . Tachycardia, paroxysmal May 2013    Possible SVT  . Abnormal heart rhythms 11/2012    A-Fib  . Heart murmur     from birth    Past Surgical History  Procedure Laterality Date  . Nasal septum surgery  2004  . Tee without cardioversion N/A 11/21/2012    Procedure: TRANSESOPHAGEAL ECHOCARDIOGRAM (TEE);  Surgeon: Dolores Patty, MD;  Location: [facility] ENDOSCOPY;  Service: Cardiovascular;  Laterality: N/A;  . Cardiac catheterization  > 5 yr ago    Done at [employer] [employer], reportedly clean  . Cardiac catheterization  11/2012  . Multiple extractions with alveoloplasty N/A 12/11/2012    Procedure: Extraction of tooth #'s 2,3,4,5,14,15,17,21,22,23,24,25,26,27,32 wioth alveoloplasty and bialteral fibrous tuberosity reductions.;  Surgeon: Charlynne Pander, DDS;  Location: [facility] ORS;  Service: Oral Surgery;  Laterality: N/A;    History  Smoking status  . Never Smoker   Smokeless tobacco  . Never Used    History  Alcohol Use No    History   Social History  . Marital Status: Married    Spouse Name: N/A    Number of Children: N/A  . 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  Smoking status: Never Smoker   . Smokeless tobacco: Never Used  . Alcohol Use: No  . Drug Use: No  . Sexually Active: No   Other Topics Concern  . Not on file   Social History Narrative   Still works full time. Works around the yard. Lives with wife. 2 sisters, neither with cardiac issues.    Allergies  Allergen Reactions  . Biaxin (Clarithromycin) Nausea Only    Current Outpatient Prescriptions  Medication Sig Dispense Refill  . acetaminophen (TYLENOL) 500 MG tablet Take 500 mg by mouth every 6 (six) hours as needed for pain.      Marland Kitchen apixaban (ELIQUIS) 5 MG TABS tablet Take 5 mg by mouth 2 (two) times daily.      Marland Kitchen aspirin 81  MG tablet Take 81 mg by mouth daily.      Marland Kitchen atorvastatin (LIPITOR) 20 MG tablet Take 20 mg by mouth at bedtime.      Marland Kitchen diltiazem (CARDIZEM CD) 360 MG 24 hr capsule Take 360 mg by mouth every morning.      . furosemide (LASIX) 40 MG tablet Take 40 mg by mouth every morning.      . Glucose Blood (BLOOD GLUCOSE TEST STRIPS) STRP 1 strip by In Vitro route 2 (two) times daily.  100 each  0  . insulin glargine (LANTUS) 100 units/mL SOLN Inject 18 Units into the skin daily at 10 pm.      . metFORMIN (GLUCOPHAGE-XR) 500 MG 24 hr tablet Take 1,000 mg by mouth 2 (two) times daily.      . metoprolol (LOPRESSOR) 50 MG tablet Take 75 mg by mouth 2 (two) times daily.      Marland Kitchen omeprazole (PRILOSEC) 40 MG capsule Take 40 mg by mouth daily.      . potassium chloride SA (K-DUR,KLOR-CON) 20 MEQ tablet Take 20 mEq by mouth daily.       No current facility-administered medications for this visit.     (Not in a hospital admission)  No family history on file.   Review of Systems:     Cardiac Review of Systems: Y or N  Chest Pain [ y when in rapid af Laodice.Slates   ]  Resting SOB [ y  ] Exertional SOB  [ y ]  Pollyann Kennedy Milo.Brash  ]   Pedal Edema [ y  ]    Palpitations Cove.Etienne  ] Syncope  [ n ]   Presyncope [ n  ]  General Review of Systems: [Y] = yes [  ]=no Constitional: recent weight change Cove.Etienne  ]; anorexia [  ]; fatigue Cove.Etienne  ]; nausea [  ]; night sweats [  ]; fever [  ]; or chills [  ]                                                               Dental: poor dentition[ yes ]; Last Dentist visit: several years  Eye : blurred vision [  ]; diplopia [   ]; vision changes [n  ];  Amaurosis fugax[n  ]; Resp: cough [  ];  wheezing[  ];  hemoptysis[  ]; shortness of breath[ y ]; paroxysmal nocturnal dyspnea[y  ]; dyspnea on exertion[ y ]; or orthopnea[  ];  GI:  gallstones[  ],  vomiting[  ];  dysphagia[  ]; melena[  ];  hematochezia [  ]; heartburn[  ];   Hx of  Colonoscopy[  ]; GU: kidney stones [  ]; hematuria[  ];   dysuria [  ];   nocturia[  ];  history of     obstruction [n  ]; urinary frequency [ n ]             Skin: rash, swelling[  ];, hair loss[  ];  peripheral edema[  ];  or itching[  ]; Musculosketetal: myalgias[  ];  joint swelling[  ];  joint erythema[  ];  joint pain[  ];  back pain[  ];  Heme/Lymph: bruising[  ];  bleeding[ n ];  anemia[  ];  Neuro: TIA[n  ];  headaches[  ];  stroke[  ];  vertigo[  ];  seizures[ n ];   paresthesias[ n ];  difficulty walking[  n];  Psych:depression[n  ]; anxiety[n  ];  Endocrine: diabetes[ n ];  thyroid dysfunction[  ];  Immunizations: Flu [  ]; Pneumococcal[  ];  Other:  Physical Exam: BP 128/70  Pulse 64  Resp 16  Ht 6' (1.829 m)  Wt 211 lb (95.709 kg)  BMI 28.61 kg/m2  SpO2 98%  General appearance: alert, cooperative, appears older than stated age and no distress Neurologic: intact Heart: regular rate and rhythm and systolic murmur: holosystolic 3/6, blowing throughout the precordium Lungs: clear to auscultation bilaterally Abdomen: soft, non-tender; bowel sounds normal; no masses,  no organomegaly Extremities: extremities normal, atraumatic, no cyanosis or edema and Homans sign is negative, no sign of DVT No carotid bruits, full femoral, dt and Pt pulses bilaterial  Diagnostic Studies & Laboratory data:     Recent Radiology Findings:  X-ray Chest Pa And Lateral   11/18/2012   *RADIOLOGY REPORT*  Clinical Data: Short of breath  CHEST - 2 VIEW  Comparison: None.  Findings: Heart silhouette is enlarged.  There is central venous pulmonary congestion.  Small bilateral pleural effusions.  No focal infiltrate.  No overt pulmonary edema. No acute osseous abnormality.  IMPRESSION: Cardiomegaly, central venous congestion, and small effusions.   Original Report Authenticated By: Genevive Bi, M.D.     Recent Lab Findings: Lab Results  Component Value Date   WBC 14.9* 12/11/2012   HGB 14.2 12/11/2012   HCT 42.3 12/11/2012   PLT 208 12/11/2012   GLUCOSE 160* 12/11/2012    ALT 13 12/11/2012   AST 18 12/11/2012   NA 139 12/11/2012   K 4.2 12/11/2012   CL 103 12/11/2012   CREATININE 0.75 12/11/2012   BUN 15 12/11/2012   CO2 24 12/11/2012   TSH 1.571 11/17/2012   INR 1.10 11/18/2012   HGBA1C 10.6* 11/17/2012   AOZ:HYQMVHQI:  LEFT VENTRICLE: EF = 50-55%. No regional wall motion abnormalities  INTRAVENTRICULAR SEPTUM: Small to moderate sized perimembranous VSD immediately under AoV with prominent L->R shunting   RIGHT VENTRICLE: Mildly dilated. Mild HK  LEFT ATRIUM: Dilated  LEFT ATRIAL APPENDAGE: No thrombus.  RIGHT ATRIUM: Dilated  AORTIC VALVE: Trileaflet. Mild AI.  MITRAL VALVE: Normal. Mild to moderate MR  TRICUSPID VALVE: Dilated annulus moderate TR  PULMONIC VALVE: Grossly normal. Mild PR  INTERATRIAL SEPTUM: No PFO or ASD.  PERICARDIUM: No effusion  DESCENDING AORTA: Mild plaque   CATH:Cardiac Cath Procedure Note  Indication: CP (in setting of AF with RVR) and VSD  Procedures performed:  1) Right heart cathererization  2) Selective coronary angiography  3) Left heart catheterization  4) Left ventriculogram  5) Shunt run  Description of procedure:   The risks and indication of the procedure were explained. Consent was signed and placed on the chart. An appropriate timeout was taken prior to the procedure. The right groin was prepped and draped in the routine sterile fashion and anesthetized with 1% local lidocaine.  A 5 FR arterial sheath was placed in the right femoral artery using a modified Seldinger technique. Standard catheters including a JL4, JR4 and angled pigtail were used. All catheter exchanges were made over a wire. A 7 FR venous sheath was placed in the right femoral vein using a modified Seldinger technique. A standard Swan-Ganz catheter was used for the procedure.  Complications: None apparent  Total contrast: 80 cc  Findings:  RA = 19  RV = 45/15/18  PA = 48/24 (34)  PCW = 22 (v = 35)  Fick cardiac output/index = 4.9/2.2  PVR = 1.2 Woods    SVR = 1040  FA sat = 97%  SVC sat = 62%  RA sat = 64%  RV sat = 78%  PA sat = 81%  Qp/QS = 2.1  Ao Pressure: 116/69 (86)  LV Pressure: 106/13/18  There was no signficant gradient across the aortic valve on pullback.  Left main: Normal  LAD: Mild plaque in proximal portion  Ramus: Very large branching vessel. Normal  LCX: Small vessel. normal  RCA: Dominant vessel. 20% mid. 80-90% hazy lesion in ostium of small to moderate -sized PDA (2.25 mm)  LV-gram done in the RAO projection: Ejection fraction = 45% global HK  Assessment:  1. 1v CAD as described above  2. Significantly elevated R-sided filling pressure  3. VSD with evidence of moderate L -> R shunt by Qp/QS ratio  4. EF 45%  Plan/Discussion:  Based on cath numbers VSD appears hemodynamically significant. Will arrange TEE.  He has significant lesion in small-to-moderate sized PDA. CP occurred during rapid AF but uncommon with exertion. I reviewed with Dr. Clifton James lesion is approachable percutaneously but given lack of significant exertional CP and possible need for CT surgery for VSD will treat medically for now.  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 The goals risks and alternatives of the planned surgical procedure  have been discussed with the patient in detail. The risks of the procedure including death, infection, stroke, myocardial infarction, bleeding, blood transfusion have all been discussed specifically .  We have specifically  discussed the risk of heart block and need for permanent pace maker. Patient is wiling to proceed. Tentatively Aug 5.    Delight Ovens MD      [address] [place] [place] [address] Office [phone]   Beeper [phone]  12/27/2012

## 2012-12-30 ENCOUNTER — Ambulatory Visit (INDEPENDENT_AMBULATORY_CARE_PROVIDER_SITE_OTHER): Payer: Managed Care, Other (non HMO) | Admitting: Cardiology

## 2012-12-30 ENCOUNTER — Encounter: Payer: Self-pay | Admitting: Cardiology

## 2012-12-30 VITALS — BP 133/73 | HR 75 | Wt 217.0 lb

## 2012-12-30 DIAGNOSIS — I5031 Acute diastolic (congestive) heart failure: Secondary | ICD-10-CM

## 2012-12-30 DIAGNOSIS — I4891 Unspecified atrial fibrillation: Secondary | ICD-10-CM

## 2012-12-30 DIAGNOSIS — I10 Essential (primary) hypertension: Secondary | ICD-10-CM

## 2012-12-30 DIAGNOSIS — Q210 Ventricular septal defect: Secondary | ICD-10-CM

## 2012-12-30 DIAGNOSIS — E785 Hyperlipidemia, unspecified: Secondary | ICD-10-CM

## 2012-12-30 NOTE — Assessment & Plan Note (Signed)
Continue present medications for rate control. Continue anti-coagulation. Discontinue aspirin.

## 2012-12-30 NOTE — Assessment & Plan Note (Signed)
Continue statin. 

## 2012-12-30 NOTE — Assessment & Plan Note (Signed)
Patient much improved. Continue present dose of Lasix.

## 2012-12-30 NOTE — Assessment & Plan Note (Signed)
Blood pressure controlled. Continue present medications. 

## 2012-12-30 NOTE — Progress Notes (Signed)
HPI: 68 year old male for followup of atrial fibrillation. Patient admitted in June of 2014 with new onset atrial fibrillation. Echocardiogram revealed an ejection fraction of 50%. There was a small perimembranous VSD. There was mild aortic insufficiency and trace mitral regurgitation. There was mild left atrial enlargement, moderate right atrial enlargement and mild right ventricular enlargement. Transesophageal echocardiogram showed an ejection fraction of 50-55% with mild aortic insufficiency, mild to moderate mitral regurgitation, moderate tricuspid regurgitation and a small to moderate size perimembranous VSD. Cardiac catheterization revealed an 80-90% lesion in the ostium of a small to moderate size PDA. No other coronary disease noted. Ejection fraction 45%. Right-sided filling pressures were elevated and there was evidence of left to right shunt with Qp/Qs 2.1. Patient has been seen by Dr. Tyrone Sage and surgery planned (VSD closure, maze and CABG). Since DC, the patient has dyspnea with more extreme activities but not with routine activities. It is relieved with rest. It is not associated with chest pain. There is no orthopnea, PND or pedal edema. There is no syncope or palpitations. There is no exertional chest pain.   Current Outpatient Prescriptions  Medication Sig Dispense Refill  . acetaminophen (TYLENOL) 500 MG tablet Take 500 mg by mouth every 6 (six) hours as needed for pain.      Marland Kitchen apixaban (ELIQUIS) 5 MG TABS tablet Take 5 mg by mouth 2 (two) times daily.      Marland Kitchen aspirin 81 MG tablet Take 81 mg by mouth daily.      Marland Kitchen atorvastatin (LIPITOR) 20 MG tablet Take 20 mg by mouth at bedtime.      Marland Kitchen diltiazem (CARDIZEM CD) 360 MG 24 hr capsule Take 360 mg by mouth every morning.      . furosemide (LASIX) 40 MG tablet Take 40 mg by mouth every morning.      . Glucose Blood (BLOOD GLUCOSE TEST STRIPS) STRP 1 strip by In Vitro route 2 (two) times daily.  100 each  0  . insulin glargine (LANTUS)  100 units/mL SOLN Inject 18 Units into the skin daily at 10 pm.      . metFORMIN (GLUCOPHAGE-XR) 500 MG 24 hr tablet Take 1,000 mg by mouth 2 (two) times daily.      . metoprolol (LOPRESSOR) 50 MG tablet Take 75 mg by mouth 2 (two) times daily.      Marland Kitchen omeprazole (PRILOSEC) 40 MG capsule Take 40 mg by mouth daily.      . potassium chloride SA (K-DUR,KLOR-CON) 20 MEQ tablet Take 20 mEq by mouth daily.       No current facility-administered medications for this visit.     Past Medical History  Diagnosis Date  . Hypertension   . Sleep apnea   . Diabetes mellitus without complication   . GERD (gastroesophageal reflux disease)   . Hyperlipidemia   . Tachycardia, paroxysmal May 2013    Possible SVT  . Abnormal heart rhythms 11/2012    A-Fib  . VSD (ventricular septal defect)     Past Surgical History  Procedure Laterality Date  . Nasal septum surgery  2004  . Tee without cardioversion N/A 11/21/2012    Procedure: TRANSESOPHAGEAL ECHOCARDIOGRAM (TEE);  Surgeon: Dolores Patty, MD;  Location: [facility] ENDOSCOPY;  Service: Cardiovascular;  Laterality: N/A;  . Cardiac catheterization  > 5 yr ago    Done at [facility] [employer], reportedly clean  . Cardiac catheterization  11/2012  . Multiple extractions with alveoloplasty N/A 12/11/2012    Procedure: Extraction of tooth #'  s 2,3,4,5,14,15,17,21,22,23,24,25,26,27,32 wioth alveoloplasty and bialteral fibrous tuberosity reductions.;  Surgeon: Charlynne Pander, DDS;  Location: [facility] ORS;  Service: Oral Surgery;  Laterality: N/A;    History   Social History  . Marital Status: Married    Spouse Name: N/A    Number of Children: N/A  . 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 Smoking status: Never Smoker   . Smokeless tobacco: Never Used  . Alcohol Use: No  . Drug Use: No  . Sexually Active: No   Other Topics Concern  . Not on file   Social History Narrative   Still  works full time. Works around the yard. Lives with wife. 2 sisters, neither with cardiac issues.    ROS: no fevers or chills, productive cough, hemoptysis, dysphasia, odynophagia, melena, hematochezia, dysuria, hematuria, rash, seizure activity, orthopnea, PND, pedal edema, claudication. Remaining systems are negative.  Physical Exam: Well-developed well-nourished in no acute distress.  Skin is warm and dry.  HEENT is normal.  Neck is supple.  Chest is clear to auscultation with normal expansion.  Cardiovascular exam is irregular, 3/6 systolic murmur Abdominal exam nontender or distended. No masses palpated. Extremities show no edema. neuro grossly intact  eelectrocardiogram-atrial fibrillation at a rate of 75. Left anterior fascicular block.

## 2012-12-30 NOTE — Patient Instructions (Addendum)
Your physician recommends that you schedule a follow-up appointment in: TBD  STOP ASPIRIN

## 2012-12-30 NOTE — Assessment & Plan Note (Signed)
Patient is scheduled for VSD repair, coronary artery bypassing graft, and maze procedure next week.

## 2012-12-31 ENCOUNTER — Telehealth: Payer: Self-pay | Admitting: Cardiology

## 2012-12-31 ENCOUNTER — Encounter (HOSPITAL_COMMUNITY): Payer: Self-pay | Admitting: Pharmacy Technician

## 2012-12-31 NOTE — Telephone Encounter (Signed)
Opened in error, please view release note. rmf 12/31/12.

## 2012-12-31 NOTE — Telephone Encounter (Signed)
Dr.Crenshaw Signed RTW paper,[place] CMS papers & FMLA sending back to [place] [facility]. Interoffice, gina aware Coming 12/31/12/KM

## 2013-01-03 ENCOUNTER — Ambulatory Visit (HOSPITAL_COMMUNITY)
Admission: RE | Admit: 2013-01-03 | Discharge: 2013-01-03 | Disposition: A | Discharge status: Home / Self Care | Payer: Managed Care, Other (non HMO) | Source: Ambulatory Visit | Attending: Cardiothoracic Surgery | Admitting: Cardiothoracic Surgery

## 2013-01-03 ENCOUNTER — Encounter (HOSPITAL_COMMUNITY): Payer: Self-pay

## 2013-01-03 ENCOUNTER — Encounter (HOSPITAL_COMMUNITY)
Admission: RE | Admit: 2013-01-03 | Discharge: 2013-01-03 | Disposition: A | Discharge status: Home / Self Care | Payer: Managed Care, Other (non HMO) | Source: Ambulatory Visit | Attending: Cardiothoracic Surgery | Admitting: Cardiothoracic Surgery

## 2013-01-03 VITALS — BP 152/87 | HR 117 | Temp 97.8°F | Resp 18 | Ht 72.0 in | Wt 215.1 lb

## 2013-01-03 DIAGNOSIS — R011 Cardiac murmur, unspecified: Secondary | ICD-10-CM

## 2013-01-03 DIAGNOSIS — I272 Pulmonary hypertension, unspecified: Secondary | ICD-10-CM

## 2013-01-03 DIAGNOSIS — Z01810 Encounter for preprocedural cardiovascular examination: Secondary | ICD-10-CM | POA: Insufficient documentation

## 2013-01-03 DIAGNOSIS — I5031 Acute diastolic (congestive) heart failure: Secondary | ICD-10-CM

## 2013-01-03 DIAGNOSIS — I2510 Atherosclerotic heart disease of native coronary artery without angina pectoris: Secondary | ICD-10-CM

## 2013-01-03 DIAGNOSIS — Z01811 Encounter for preprocedural respiratory examination: Secondary | ICD-10-CM | POA: Insufficient documentation

## 2013-01-03 DIAGNOSIS — I4891 Unspecified atrial fibrillation: Secondary | ICD-10-CM

## 2013-01-03 DIAGNOSIS — I10 Essential (primary) hypertension: Secondary | ICD-10-CM

## 2013-01-03 DIAGNOSIS — R600 Localized edema: Secondary | ICD-10-CM

## 2013-01-03 DIAGNOSIS — Z01812 Encounter for preprocedural laboratory examination: Secondary | ICD-10-CM | POA: Insufficient documentation

## 2013-01-03 DIAGNOSIS — E785 Hyperlipidemia, unspecified: Secondary | ICD-10-CM

## 2013-01-03 DIAGNOSIS — I6529 Occlusion and stenosis of unspecified carotid artery: Secondary | ICD-10-CM | POA: Insufficient documentation

## 2013-01-03 LAB — CBC
HCT: 39.5 % (ref 39.0–52.0)
Hemoglobin: 13.8 g/dL (ref 13.0–17.0)
MCH: 32.2 pg (ref 26.0–34.0)
MCHC: 34.9 g/dL (ref 30.0–36.0)
MCV: 92.3 fL (ref 78.0–100.0)
Platelets: 184 10*3/uL (ref 150–400)
RBC: 4.28 MIL/uL (ref 4.22–5.81)
RDW: 12.3 % (ref 11.5–15.5)
WBC: 7.1 10*3/uL (ref 4.0–10.5)

## 2013-01-03 LAB — COMPREHENSIVE METABOLIC PANEL
ALT: 24 U/L (ref 0–53)
AST: 20 U/L (ref 0–37)
Albumin: 3.3 g/dL — ABNORMAL LOW (ref 3.5–5.2)
Alkaline Phosphatase: 63 U/L (ref 39–117)
BUN: 13 mg/dL (ref 6–23)
CO2: 20 mEq/L (ref 19–32)
Calcium: 8.7 mg/dL (ref 8.4–10.5)
Chloride: 107 mEq/L (ref 96–112)
Creatinine, Ser: 0.84 mg/dL (ref 0.50–1.35)
GFR calc Af Amer: >90 mL/min (ref >90)
GFR calc non Af Amer: 90 mL/min — ABNORMAL LOW (ref >90)
Glucose, Bld: 104 mg/dL — ABNORMAL HIGH (ref 70–99)
Potassium: 4 mEq/L (ref 3.5–5.1)
Sodium: 139 mEq/L (ref 135–145)
Total Bilirubin: 0.5 mg/dL (ref 0.3–1.2)
Total Protein: 6.4 g/dL (ref 6.0–8.3)

## 2013-01-03 LAB — URINALYSIS, ROUTINE W REFLEX MICROSCOPIC
Bilirubin Urine: NEGATIVE
Glucose, UA: NEGATIVE mg/dL
Hgb urine dipstick: NEGATIVE
Ketones, ur: NEGATIVE mg/dL
Leukocytes, UA: NEGATIVE
Nitrite: NEGATIVE
Protein, ur: NEGATIVE mg/dL
Specific Gravity, Urine: 1.013 (ref 1.005–1.030)
Urobilinogen, UA: 0.2 mg/dL (ref 0.0–1.0)
pH: 5 (ref 5.0–8.0)

## 2013-01-03 LAB — PROTIME-INR
INR: 1.05 (ref 0.00–1.49)
Prothrombin Time: 13.5 seconds (ref 11.6–15.2)

## 2013-01-03 LAB — BLOOD GAS, ARTERIAL
Acid-base deficit: 0.7 mmol/L (ref 0.0–2.0)
Bicarbonate: 22.8 mEq/L (ref 20.0–24.0)
Drawn by: 344381
FIO2: 0.21 %
O2 Saturation: 97.2 %
Patient temperature: 98.6
TCO2: 23.9 mmol/L (ref 0–100)
pCO2 arterial: 33.9 mmHg — ABNORMAL LOW (ref 35.0–45.0)
pH, Arterial: 7.443 (ref 7.350–7.450)
pO2, Arterial: 87.3 mmHg (ref 80.0–100.0)

## 2013-01-03 LAB — PULMONARY FUNCTION TEST

## 2013-01-03 LAB — SURGICAL PCR SCREEN
MRSA, PCR: NEGATIVE
Staphylococcus aureus: NEGATIVE

## 2013-01-03 LAB — TYPE AND SCREEN
ABO/RH(D): A POS
Antibody Screen: NEGATIVE

## 2013-01-03 LAB — APTT: aPTT: 31 seconds (ref 24–37)

## 2013-01-03 LAB — HEMOGLOBIN A1C
Hgb A1c MFr Bld: 7.7 % — ABNORMAL HIGH (ref <5.7)
Mean Plasma Glucose: 174 mg/dL — ABNORMAL HIGH (ref <117)

## 2013-01-03 LAB — ABO/RH: ABO/RH(D): A POS

## 2013-01-03 MED ORDER — ALBUTEROL SULFATE (5 MG/ML) 0.5% IN NEBU
2.5000 mg | INHALATION_SOLUTION | Freq: Once | RESPIRATORY_TRACT | Status: AC
  Administered 2013-01-03: 2.5 mg via RESPIRATORY_TRACT

## 2013-01-03 NOTE — Pre-Procedure Instructions (Signed)
Veer Elamin  01/03/2013   Your procedure is scheduled on:  01/07/2013  Report to [facility] at 5:30 AM.  Call this number if you have problems the morning of surgery: [phone]   Remember:   Do not eat food or drink liquids after midnight. On Monday  Take these medicines the morning of surgery with A SIP OF WATER: omeprazole, cardizem   Do not wear jewelry  Do not wear lotions, powders, or perfumes. You may wear deodorant.   Men may shave face and neck.  Do not bring valuables to the hospital.  [facility] is not responsible                   for any belongings or valuables.  Contacts, dentures or bridgework may not be worn into surgery.  Leave suitcase in the car. After surgery it may be brought to your room.  For patients admitted to the hospital, checkout time is 11:00 AM the day of  discharge.   Patients discharged the day of surgery will not be allowed to drive  home.  Name and phone number of your driver: with family  Special Instructions: Shower using CHG 2 nights before surgery and the night before surgery.  If you shower the day of surgery use CHG.  Use special wash - you have one bottle of CHG for all showers.  You should use approximately 1/3 of the bottle for each shower.   Please read over the following fact sheets that you were given: Pain Booklet, Coughing and Deep Breathing, Blood Transfusion Information, Open Heart Packet, MRSA Information and Surgical Site Infection Prevention

## 2013-01-03 NOTE — Progress Notes (Signed)
Spoke /w Dee at [employer], regarding pt. understading of use of Metoprolol prior to surgery.  She will f/u with pt.

## 2013-01-03 NOTE — Progress Notes (Signed)
VASCULAR LAB PRELIMINARY  PRELIMINARY  PRELIMINARY  PRELIMINARY  Pre-op Cardiac Surgery  Carotid Findings:  Bilateral - 1% - 39% ICA stenosis. Vertebral artery flow is antegrade.  Upper Extremity Right Left  Brachial Pressures 133 Triphasic 127 Triphasic  Radial Waveforms Triphasic Triphasic  Ulnar Waveforms Triphasic Triphasic  Palmar Arch (Allen's Test) Normal Normal   Findings:  Doppler waveforms remained normal bilaterally with both radial and ulnar compressions    Lower  Extremity Right Left  Dorsalis Pedis 156 Triphasic 133 Triphasic  Posterior Tibial 159 Triphasic 137 Triphasic  Ankle/Brachial Indices 1.20 1.03    Findings:  ABIs and Doppler waveforms are within normal limits bilaterally at rest.   Lygia Olaes, RVS 01/03/2013, 12:08 PM

## 2013-01-06 MED ORDER — DEXMEDETOMIDINE HCL IN NACL 400 MCG/100ML IV SOLN
0.1000 ug/kg/h | INTRAVENOUS | Status: AC
  Administered 2013-01-07: 0.2 ug/kg/h via INTRAVENOUS
  Filled 2013-01-06: qty 100

## 2013-01-06 MED ORDER — PHENYLEPHRINE HCL 10 MG/ML IJ SOLN
30.0000 ug/min | INTRAVENOUS | Status: AC
  Administered 2013-01-07: 50 ug/min via INTRAVENOUS
  Filled 2013-01-06: qty 2

## 2013-01-06 MED ORDER — SODIUM CHLORIDE 0.9 % IV SOLN
1250.0000 mg | INTRAVENOUS | Status: DC
  Filled 2013-01-06: qty 1250

## 2013-01-06 MED ORDER — SODIUM CHLORIDE 0.9 % IV SOLN
1500.0000 mg | INTRAVENOUS | Status: AC
  Administered 2013-01-07: 1500 mg via INTRAVENOUS
  Filled 2013-01-06: qty 1500

## 2013-01-06 MED ORDER — EPINEPHRINE HCL 1 MG/ML IJ SOLN
0.5000 ug/min | INTRAVENOUS | Status: DC
  Filled 2013-01-06: qty 4

## 2013-01-06 MED ORDER — DEXTROSE 5 % IV SOLN
750.0000 mg | INTRAVENOUS | Status: DC
  Filled 2013-01-06: qty 750

## 2013-01-06 MED ORDER — HEPARIN SODIUM (PORCINE) 1000 UNIT/ML IJ SOLN
INTRAMUSCULAR | Status: DC
  Filled 2013-01-06 (×2): qty 30

## 2013-01-06 MED ORDER — MAGNESIUM SULFATE 50 % IJ SOLN
40.0000 meq | INTRAMUSCULAR | Status: DC
  Filled 2013-01-06: qty 10

## 2013-01-06 MED ORDER — SODIUM CHLORIDE 0.9 % IV SOLN
INTRAVENOUS | Status: AC
  Administered 2013-01-07: 69.8 mL/h via INTRAVENOUS
  Filled 2013-01-06: qty 40

## 2013-01-06 MED ORDER — DEXTROSE 5 % IV SOLN
1.5000 g | INTRAVENOUS | Status: AC
  Administered 2013-01-07: 1.5 g via INTRAVENOUS
  Administered 2013-01-07: .75 g via INTRAVENOUS
  Filled 2013-01-06 (×2): qty 1.5

## 2013-01-06 MED ORDER — SODIUM CHLORIDE 0.9 % IV SOLN
INTRAVENOUS | Status: AC
  Administered 2013-01-07: 3.8 [IU]/h via INTRAVENOUS
  Filled 2013-01-06 (×2): qty 1

## 2013-01-06 MED ORDER — NITROGLYCERIN IN D5W 200-5 MCG/ML-% IV SOLN
2.0000 ug/min | INTRAVENOUS | Status: DC
  Filled 2013-01-06: qty 250

## 2013-01-06 MED ORDER — DOPAMINE-DEXTROSE 3.2-5 MG/ML-% IV SOLN
2.0000 ug/kg/min | INTRAVENOUS | Status: DC
  Filled 2013-01-06: qty 250

## 2013-01-06 MED ORDER — PLASMA-LYTE 148 IV SOLN
INTRAVENOUS | Status: AC
  Administered 2013-01-07: 09:00:00
  Filled 2013-01-06: qty 2.5

## 2013-01-06 MED ORDER — POTASSIUM CHLORIDE 2 MEQ/ML IV SOLN
80.0000 meq | INTRAVENOUS | Status: DC
  Filled 2013-01-06: qty 40

## 2013-01-07 ENCOUNTER — Inpatient Hospital Stay (HOSPITAL_COMMUNITY): Payer: Managed Care, Other (non HMO)

## 2013-01-07 ENCOUNTER — Inpatient Hospital Stay (HOSPITAL_COMMUNITY)
Admission: RE | Admit: 2013-01-07 | Discharge: 2013-01-12 | DRG: 229 | Disposition: A | Discharge status: Home Health | Payer: Managed Care, Other (non HMO) | Source: Ambulatory Visit | Attending: Cardiothoracic Surgery | Admitting: Cardiothoracic Surgery

## 2013-01-07 ENCOUNTER — Inpatient Hospital Stay (HOSPITAL_COMMUNITY): Payer: Managed Care, Other (non HMO) | Admitting: Critical Care Medicine

## 2013-01-07 ENCOUNTER — Encounter (HOSPITAL_COMMUNITY)
Admission: RE | Disposition: A | Discharge status: Home Health | Payer: Self-pay | Source: Ambulatory Visit | Attending: Cardiothoracic Surgery

## 2013-01-07 ENCOUNTER — Encounter (HOSPITAL_COMMUNITY): Payer: Self-pay | Admitting: *Deleted

## 2013-01-07 ENCOUNTER — Encounter (HOSPITAL_COMMUNITY): Payer: Self-pay | Admitting: Critical Care Medicine

## 2013-01-07 DIAGNOSIS — I129 Hypertensive chronic kidney disease with stage 1 through stage 4 chronic kidney disease, or unspecified chronic kidney disease: Secondary | ICD-10-CM | POA: Diagnosis present

## 2013-01-07 DIAGNOSIS — Q210 Ventricular septal defect: Secondary | ICD-10-CM

## 2013-01-07 DIAGNOSIS — I2510 Atherosclerotic heart disease of native coronary artery without angina pectoris: Secondary | ICD-10-CM

## 2013-01-07 DIAGNOSIS — I509 Heart failure, unspecified: Secondary | ICD-10-CM | POA: Diagnosis not present

## 2013-01-07 DIAGNOSIS — I4892 Unspecified atrial flutter: Secondary | ICD-10-CM | POA: Diagnosis not present

## 2013-01-07 DIAGNOSIS — E119 Type 2 diabetes mellitus without complications: Secondary | ICD-10-CM | POA: Diagnosis present

## 2013-01-07 DIAGNOSIS — K219 Gastro-esophageal reflux disease without esophagitis: Secondary | ICD-10-CM | POA: Diagnosis present

## 2013-01-07 DIAGNOSIS — I4891 Unspecified atrial fibrillation: Secondary | ICD-10-CM | POA: Diagnosis present

## 2013-01-07 DIAGNOSIS — G4733 Obstructive sleep apnea (adult) (pediatric): Secondary | ICD-10-CM | POA: Diagnosis present

## 2013-01-07 DIAGNOSIS — I471 Supraventricular tachycardia, unspecified: Secondary | ICD-10-CM

## 2013-01-07 DIAGNOSIS — I5031 Acute diastolic (congestive) heart failure: Secondary | ICD-10-CM

## 2013-01-07 DIAGNOSIS — D62 Acute posthemorrhagic anemia: Secondary | ICD-10-CM | POA: Diagnosis not present

## 2013-01-07 DIAGNOSIS — E785 Hyperlipidemia, unspecified: Secondary | ICD-10-CM | POA: Diagnosis present

## 2013-01-07 DIAGNOSIS — Z7982 Long term (current) use of aspirin: Secondary | ICD-10-CM

## 2013-01-07 DIAGNOSIS — I498 Other specified cardiac arrhythmias: Secondary | ICD-10-CM | POA: Diagnosis not present

## 2013-01-07 DIAGNOSIS — N189 Chronic kidney disease, unspecified: Secondary | ICD-10-CM | POA: Diagnosis present

## 2013-01-07 DIAGNOSIS — I5030 Unspecified diastolic (congestive) heart failure: Secondary | ICD-10-CM | POA: Diagnosis not present

## 2013-01-07 DIAGNOSIS — Z79899 Other long term (current) drug therapy: Secondary | ICD-10-CM

## 2013-01-07 DIAGNOSIS — D696 Thrombocytopenia, unspecified: Secondary | ICD-10-CM | POA: Diagnosis not present

## 2013-01-07 HISTORY — PX: VSD REPAIR: SHX276

## 2013-01-07 HISTORY — PX: INTRAOPERATIVE TRANSESOPHAGEAL ECHOCARDIOGRAM: SHX5062

## 2013-01-07 HISTORY — PX: CORONARY ARTERY BYPASS GRAFT: SHX141

## 2013-01-07 HISTORY — PX: CLIPPING OF ATRIAL APPENDAGE: SHX5773

## 2013-01-07 HISTORY — PX: MAZE: SHX5063

## 2013-01-07 LAB — POCT I-STAT 3, ART BLOOD GAS (G3+)
Acid-base deficit: 1 mmol/L (ref 0.0–2.0)
Acid-base deficit: 13 mmol/L — ABNORMAL HIGH (ref 0.0–2.0)
Acid-base deficit: 3 mmol/L — ABNORMAL HIGH (ref 0.0–2.0)
Acid-base deficit: 4 mmol/L — ABNORMAL HIGH (ref 0.0–2.0)
Acid-base deficit: 4 mmol/L — ABNORMAL HIGH (ref 0.0–2.0)
Acid-base deficit: 5 mmol/L — ABNORMAL HIGH (ref 0.0–2.0)
Acid-base deficit: 6 mmol/L — ABNORMAL HIGH (ref 0.0–2.0)
Acid-base deficit: 6 mmol/L — ABNORMAL HIGH (ref 0.0–2.0)
Bicarbonate: 14.4 mEq/L — ABNORMAL LOW (ref 20.0–24.0)
Bicarbonate: 18.7 mEq/L — ABNORMAL LOW (ref 20.0–24.0)
Bicarbonate: 20.7 mEq/L (ref 20.0–24.0)
Bicarbonate: 21.1 mEq/L (ref 20.0–24.0)
Bicarbonate: 21.9 mEq/L (ref 20.0–24.0)
Bicarbonate: 23.9 mEq/L (ref 20.0–24.0)
Bicarbonate: 24.5 mEq/L — ABNORMAL HIGH (ref 20.0–24.0)
Bicarbonate: 24.9 mEq/L — ABNORMAL HIGH (ref 20.0–24.0)
O2 Saturation: 100 %
O2 Saturation: 71 %
O2 Saturation: 73 %
O2 Saturation: 74 %
O2 Saturation: 86 %
O2 Saturation: 94 %
O2 Saturation: 96 %
O2 Saturation: 99 %
Patient temperature: 36.1
Patient temperature: 37.5
TCO2: 16 mmol/L (ref 0–100)
TCO2: 20 mmol/L (ref 0–100)
TCO2: 22 mmol/L (ref 0–100)
TCO2: 22 mmol/L (ref 0–100)
TCO2: 23 mmol/L (ref 0–100)
TCO2: 25 mmol/L (ref 0–100)
TCO2: 26 mmol/L (ref 0–100)
TCO2: 26 mmol/L (ref 0–100)
pCO2 arterial: 33.2 mmHg — ABNORMAL LOW (ref 35.0–45.0)
pCO2 arterial: 34.7 mmHg — ABNORMAL LOW (ref 35.0–45.0)
pCO2 arterial: 38.4 mmHg (ref 35.0–45.0)
pCO2 arterial: 45.9 mmHg — ABNORMAL HIGH (ref 35.0–45.0)
pCO2 arterial: 47.3 mmHg — ABNORMAL HIGH (ref 35.0–45.0)
pCO2 arterial: 50.5 mmHg — ABNORMAL HIGH (ref 35.0–45.0)
pCO2 arterial: 53.7 mmHg — ABNORMAL HIGH (ref 35.0–45.0)
pCO2 arterial: 58.2 mmHg (ref 35.0–45.0)
pH, Arterial: 7.182 — CL (ref 7.350–7.450)
pH, Arterial: 7.231 — ABNORMAL LOW (ref 7.350–7.450)
pH, Arterial: 7.246 — ABNORMAL LOW (ref 7.350–7.450)
pH, Arterial: 7.256 — ABNORMAL LOW (ref 7.350–7.450)
pH, Arterial: 7.262 — ABNORMAL LOW (ref 7.350–7.450)
pH, Arterial: 7.33 — ABNORMAL LOW (ref 7.350–7.450)
pH, Arterial: 7.341 — ABNORMAL LOW (ref 7.350–7.450)
pH, Arterial: 7.406 (ref 7.350–7.450)
pO2, Arterial: 167 mmHg — ABNORMAL HIGH (ref 80.0–100.0)
pO2, Arterial: 313 mmHg — ABNORMAL HIGH (ref 80.0–100.0)
pO2, Arterial: 45 mmHg — ABNORMAL LOW (ref 80.0–100.0)
pO2, Arterial: 45 mmHg — ABNORMAL LOW (ref 80.0–100.0)
pO2, Arterial: 49 mmHg — ABNORMAL LOW (ref 80.0–100.0)
pO2, Arterial: 60 mmHg — ABNORMAL LOW (ref 80.0–100.0)
pO2, Arterial: 65 mmHg — ABNORMAL LOW (ref 80.0–100.0)
pO2, Arterial: 89 mmHg (ref 80.0–100.0)

## 2013-01-07 LAB — POCT I-STAT 4, (NA,K, GLUC, HGB,HCT)
Glucose, Bld: 188 mg/dL — ABNORMAL HIGH (ref 70–99)
Glucose, Bld: 78 mg/dL (ref 70–99)
Glucose, Bld: 98 mg/dL (ref 70–99)
Glucose, Bld: 169 mg/dL — ABNORMAL HIGH (ref 70–99)
Glucose, Bld: 238 mg/dL — ABNORMAL HIGH (ref 70–99)
Glucose, Bld: 271 mg/dL — ABNORMAL HIGH (ref 70–99)
Glucose, Bld: 215 mg/dL — ABNORMAL HIGH (ref 70–99)
Glucose, Bld: 128 mg/dL — ABNORMAL HIGH (ref 70–99)
HCT: 36 % — ABNORMAL LOW (ref 39.0–52.0)
HCT: 28 % — ABNORMAL LOW (ref 39.0–52.0)
HCT: 35 % — ABNORMAL LOW (ref 39.0–52.0)
HCT: 27 % — ABNORMAL LOW (ref 39.0–52.0)
HCT: 28 % — ABNORMAL LOW (ref 39.0–52.0)
HCT: 26 % — ABNORMAL LOW (ref 39.0–52.0)
HCT: 31 % — ABNORMAL LOW (ref 39.0–52.0)
HCT: 36 % — ABNORMAL LOW (ref 39.0–52.0)
Hemoglobin: 12.2 g/dL — ABNORMAL LOW (ref 13.0–17.0)
Hemoglobin: 11.9 g/dL — ABNORMAL LOW (ref 13.0–17.0)
Hemoglobin: 9.5 g/dL — ABNORMAL LOW (ref 13.0–17.0)
Hemoglobin: 9.2 g/dL — ABNORMAL LOW (ref 13.0–17.0)
Hemoglobin: 9.5 g/dL — ABNORMAL LOW (ref 13.0–17.0)
Hemoglobin: 8.8 g/dL — ABNORMAL LOW (ref 13.0–17.0)
Hemoglobin: 10.5 g/dL — ABNORMAL LOW (ref 13.0–17.0)
Hemoglobin: 12.2 g/dL — ABNORMAL LOW (ref 13.0–17.0)
Potassium: 4.4 mEq/L (ref 3.5–5.1)
Potassium: 4.3 mEq/L (ref 3.5–5.1)
Potassium: 4.4 mEq/L (ref 3.5–5.1)
Potassium: 5.4 mEq/L — ABNORMAL HIGH (ref 3.5–5.1)
Potassium: 4.8 mEq/L (ref 3.5–5.1)
Potassium: 5.2 mEq/L — ABNORMAL HIGH (ref 3.5–5.1)
Potassium: 4.5 mEq/L (ref 3.5–5.1)
Potassium: 3.9 mEq/L (ref 3.5–5.1)
Sodium: 143 mEq/L (ref 135–145)
Sodium: 140 mEq/L (ref 135–145)
Sodium: 143 mEq/L (ref 135–145)
Sodium: 140 mEq/L (ref 135–145)
Sodium: 141 mEq/L (ref 135–145)
Sodium: 141 mEq/L (ref 135–145)
Sodium: 142 mEq/L (ref 135–145)
Sodium: 144 mEq/L (ref 135–145)

## 2013-01-07 LAB — CBC
HCT: 34.9 % — ABNORMAL LOW (ref 39.0–52.0)
Hemoglobin: 12.2 g/dL — ABNORMAL LOW (ref 13.0–17.0)
MCH: 31.2 pg (ref 26.0–34.0)
MCH: 31.6 pg (ref 26.0–34.0)
MCHC: 34 g/dL (ref 30.0–36.0)
MCHC: 35 g/dL (ref 30.0–36.0)
MCV: 90.4 fL (ref 78.0–100.0)
Platelets: 75 10*3/uL — ABNORMAL LOW (ref 150–400)
Platelets: 85 10*3/uL — ABNORMAL LOW (ref 150–400)
RBC: 4.07 MIL/uL — ABNORMAL LOW (ref 4.22–5.81)
RBC: 3.86 MIL/uL — ABNORMAL LOW (ref 4.22–5.81)
RDW: 12.2 % (ref 11.5–15.5)
WBC: 11.2 10*3/uL — ABNORMAL HIGH (ref 4.0–10.5)

## 2013-01-07 LAB — POCT I-STAT, CHEM 8
BUN: 12 mg/dL (ref 6–23)
Calcium, Ion: 1.1 mmol/L — ABNORMAL LOW (ref 1.13–1.30)
Chloride: 108 mEq/L (ref 96–112)
Creatinine, Ser: 0.8 mg/dL (ref 0.50–1.35)
Glucose, Bld: 184 mg/dL — ABNORMAL HIGH (ref 70–99)
HCT: 34 % — ABNORMAL LOW (ref 39.0–52.0)
Hemoglobin: 11.6 g/dL — ABNORMAL LOW (ref 13.0–17.0)
Potassium: 4.3 mEq/L (ref 3.5–5.1)
Sodium: 142 mEq/L (ref 135–145)
TCO2: 22 mmol/L (ref 0–100)

## 2013-01-07 LAB — PROTIME-INR: Prothrombin Time: 17.8 seconds — ABNORMAL HIGH (ref 11.6–15.2)

## 2013-01-07 LAB — CREATININE, SERUM
Creatinine, Ser: 0.8 mg/dL (ref 0.50–1.35)
GFR calc Af Amer: >90 mL/min (ref >90)
GFR calc non Af Amer: >90 mL/min (ref >90)

## 2013-01-07 LAB — MAGNESIUM: Magnesium: 2.6 mg/dL — ABNORMAL HIGH (ref 1.5–2.5)

## 2013-01-07 LAB — POCT I-STAT GLUCOSE
Glucose, Bld: 88 mg/dL (ref 70–99)
Operator id: 3293

## 2013-01-07 LAB — HEMOGLOBIN AND HEMATOCRIT, BLOOD
HCT: 28.1 % — ABNORMAL LOW (ref 39.0–52.0)
Hemoglobin: 9.8 g/dL — ABNORMAL LOW (ref 13.0–17.0)

## 2013-01-07 LAB — GLUCOSE, CAPILLARY: Glucose-Capillary: 131 mg/dL — ABNORMAL HIGH (ref 70–99)

## 2013-01-07 LAB — PLATELET COUNT: Platelets: 113 10*3/uL — ABNORMAL LOW (ref 150–400)

## 2013-01-07 SURGERY — CORONARY ARTERY BYPASS GRAFTING (CABG)
Anesthesia: General | Site: Chest | Wound class: Clean

## 2013-01-07 MED ORDER — SODIUM CHLORIDE 0.9 % IJ SOLN: 3.0000 mL | INTRAMUSCULAR | Status: DC | PRN

## 2013-01-07 MED ORDER — POTASSIUM CHLORIDE 10 MEQ/50ML IV SOLN
10.0000 meq | INTRAVENOUS | Status: AC
  Administered 2013-01-07 (×3): 10 meq via INTRAVENOUS

## 2013-01-07 MED ORDER — PROPOFOL 10 MG/ML IV BOLUS
INTRAVENOUS | Status: DC | PRN
  Administered 2013-01-07: 80 mg via INTRAVENOUS

## 2013-01-07 MED ORDER — ONDANSETRON HCL 4 MG/2ML IJ SOLN: 4.0000 mg | Freq: Four times a day (QID) | INTRAMUSCULAR | Status: DC | PRN

## 2013-01-07 MED ORDER — MAGNESIUM SULFATE 40 MG/ML IJ SOLN
4.0000 g | Freq: Once | INTRAMUSCULAR | Status: AC
  Administered 2013-01-07: 4 g via INTRAVENOUS
  Filled 2013-01-07: qty 100

## 2013-01-07 MED ORDER — ACETAMINOPHEN 500 MG PO TABS
1000.0000 mg | ORAL_TABLET | Freq: Four times a day (QID) | ORAL | Status: DC
  Administered 2013-01-08 – 2013-01-09 (×4): 1000 mg via ORAL
  Filled 2013-01-07 (×9): qty 2

## 2013-01-07 MED ORDER — SODIUM CHLORIDE 0.45 % IV SOLN
INTRAVENOUS | Status: DC
  Administered 2013-01-07: 20 mL/h via INTRAVENOUS

## 2013-01-07 MED ORDER — LIDOCAINE HCL (CARDIAC) 20 MG/ML IV SOLN
INTRAVENOUS | Status: DC | PRN
  Administered 2013-01-07: 50 mg via INTRAVENOUS

## 2013-01-07 MED ORDER — PROTAMINE SULFATE 10 MG/ML IV SOLN
INTRAVENOUS | Status: DC | PRN
  Administered 2013-01-07 (×6): 50 mg via INTRAVENOUS

## 2013-01-07 MED ORDER — METOPROLOL TARTRATE 50 MG PO TABS
ORAL_TABLET | ORAL | Status: AC
  Filled 2013-01-07: qty 2

## 2013-01-07 MED ORDER — ARTIFICIAL TEARS OP OINT
TOPICAL_OINTMENT | OPHTHALMIC | Status: DC | PRN
  Administered 2013-01-07: 1 via OPHTHALMIC

## 2013-01-07 MED ORDER — DEXMEDETOMIDINE HCL IN NACL 400 MCG/100ML IV SOLN
0.1000 ug/kg/h | INTRAVENOUS | Status: AC
  Administered 2013-01-07: 0.7 ug/kg/h via INTRAVENOUS
  Filled 2013-01-07: qty 100

## 2013-01-07 MED ORDER — SODIUM CHLORIDE 0.9 % IJ SOLN
3.0000 mL | Freq: Two times a day (BID) | INTRAMUSCULAR | Status: DC
  Administered 2013-01-08: 3 mL via INTRAVENOUS

## 2013-01-07 MED ORDER — METOPROLOL TARTRATE 25 MG/10 ML ORAL SUSPENSION
12.5000 mg | Freq: Two times a day (BID) | ORAL | Status: DC
  Administered 2013-01-08: 12.5 mg
  Filled 2013-01-07 (×5): qty 5

## 2013-01-07 MED ORDER — DEXMEDETOMIDINE HCL IN NACL 200 MCG/50ML IV SOLN: 0.1000 ug/kg/h | INTRAVENOUS | Status: DC

## 2013-01-07 MED ORDER — PHENYLEPHRINE HCL 10 MG/ML IJ SOLN
0.0000 ug/min | INTRAVENOUS | Status: DC
  Filled 2013-01-07: qty 2

## 2013-01-07 MED ORDER — FAMOTIDINE IN NACL 20-0.9 MG/50ML-% IV SOLN
20.0000 mg | Freq: Two times a day (BID) | INTRAVENOUS | Status: AC
  Administered 2013-01-07 – 2013-01-08 (×2): 20 mg via INTRAVENOUS
  Filled 2013-01-07: qty 50

## 2013-01-07 MED ORDER — ACETAMINOPHEN 650 MG RE SUPP
650.0000 mg | Freq: Once | RECTAL | Status: AC
  Administered 2013-01-07: 650 mg via RECTAL

## 2013-01-07 MED ORDER — PHENYLEPHRINE HCL 10 MG/ML IJ SOLN
30.0000 ug/min | INTRAVENOUS | Status: DC
  Filled 2013-01-07: qty 2

## 2013-01-07 MED ORDER — NITROGLYCERIN IN D5W 200-5 MCG/ML-% IV SOLN: 0.0000 ug/min | INTRAVENOUS | Status: DC

## 2013-01-07 MED ORDER — MORPHINE SULFATE 2 MG/ML IJ SOLN
2.0000 mg | INTRAMUSCULAR | Status: DC | PRN
  Administered 2013-01-08: 2 mg via INTRAVENOUS
  Administered 2013-01-08: 4 mg via INTRAVENOUS
  Administered 2013-01-08: 2 mg via INTRAVENOUS
  Filled 2013-01-07: qty 1
  Filled 2013-01-07: qty 2
  Filled 2013-01-07: qty 1

## 2013-01-07 MED ORDER — INSULIN REGULAR HUMAN 100 UNIT/ML IJ SOLN
INTRAMUSCULAR | Status: DC
  Administered 2013-01-07: 1.3 [IU]/h via INTRAVENOUS
  Administered 2013-01-08: 3.9 [IU]/h via INTRAVENOUS
  Filled 2013-01-07: qty 1

## 2013-01-07 MED ORDER — AMIODARONE HCL IN DEXTROSE 360-4.14 MG/200ML-% IV SOLN
30.0000 mg/h | INTRAVENOUS | Status: DC
  Administered 2013-01-08 – 2013-01-09 (×3): 30 mg/h via INTRAVENOUS
  Filled 2013-01-07 (×7): qty 200

## 2013-01-07 MED ORDER — DOCUSATE SODIUM 100 MG PO CAPS
200.0000 mg | ORAL_CAPSULE | Freq: Every day | ORAL | Status: DC
  Administered 2013-01-08: 200 mg via ORAL
  Filled 2013-01-07: qty 2

## 2013-01-07 MED ORDER — ALBUMIN HUMAN 5 % IV SOLN
250.0000 mL | INTRAVENOUS | Status: AC | PRN
  Administered 2013-01-07 (×3): 250 mL via INTRAVENOUS
  Filled 2013-01-07: qty 250

## 2013-01-07 MED ORDER — DOPAMINE-DEXTROSE 3.2-5 MG/ML-% IV SOLN: 0.0000 ug/kg/min | INTRAVENOUS | Status: DC

## 2013-01-07 MED ORDER — METOPROLOL TARTRATE 12.5 MG HALF TABLET
12.5000 mg | ORAL_TABLET | Freq: Two times a day (BID) | ORAL | Status: DC
  Administered 2013-01-08: 12.5 mg via ORAL
  Filled 2013-01-07 (×5): qty 1

## 2013-01-07 MED ORDER — LACTATED RINGERS IV SOLN: 500.0000 mL | Freq: Once | INTRAVENOUS | Status: AC | PRN

## 2013-01-07 MED ORDER — SODIUM BICARBONATE 8.4 % IV SOLN
INTRAVENOUS | Status: DC | PRN
  Administered 2013-01-07: 50 mL via INTRAVENOUS

## 2013-01-07 MED ORDER — LACTATED RINGERS IV SOLN
INTRAVENOUS | Status: DC
  Administered 2013-01-07: 20 mL/h via INTRAVENOUS

## 2013-01-07 MED ORDER — MORPHINE SULFATE 2 MG/ML IJ SOLN: 1.0000 mg | INTRAMUSCULAR | Status: AC | PRN

## 2013-01-07 MED ORDER — MILRINONE IN DEXTROSE 20 MG/100ML IV SOLN
0.3000 ug/kg/min | INTRAVENOUS | Status: DC
  Administered 2013-01-07 – 2013-01-08 (×2): 0.3 ug/kg/min via INTRAVENOUS
  Filled 2013-01-07 (×2): qty 100

## 2013-01-07 MED ORDER — INSULIN REGULAR BOLUS VIA INFUSION
0.0000 [IU] | Freq: Three times a day (TID) | INTRAVENOUS | Status: DC
  Administered 2013-01-08: 2 [IU] via INTRAVENOUS
  Filled 2013-01-07: qty 10

## 2013-01-07 MED ORDER — PHENYLEPHRINE HCL 10 MG/ML IJ SOLN
30.0000 ug/min | INTRAVENOUS | Status: DC
  Filled 2013-01-07: qty 1

## 2013-01-07 MED ORDER — AMIODARONE LOAD VIA INFUSION
150.0000 mg | Freq: Once | INTRAVENOUS | Status: AC
  Administered 2013-01-07: 150 mg via INTRAVENOUS
  Filled 2013-01-07: qty 83.34

## 2013-01-07 MED ORDER — PANTOPRAZOLE SODIUM 40 MG PO TBEC: 40.0000 mg | DELAYED_RELEASE_TABLET | Freq: Every day | ORAL | Status: DC

## 2013-01-07 MED ORDER — ACETAMINOPHEN 160 MG/5ML PO SOLN: 650.0000 mg | Freq: Once | ORAL | Status: AC

## 2013-01-07 MED ORDER — METOCLOPRAMIDE HCL 5 MG/ML IJ SOLN
10.0000 mg | Freq: Four times a day (QID) | INTRAMUSCULAR | Status: AC
  Administered 2013-01-07 – 2013-01-08 (×4): 10 mg via INTRAVENOUS
  Filled 2013-01-07 (×4): qty 2

## 2013-01-07 MED ORDER — VANCOMYCIN HCL IN DEXTROSE 1-5 GM/200ML-% IV SOLN
1000.0000 mg | Freq: Once | INTRAVENOUS | Status: AC
  Administered 2013-01-07: 1000 mg via INTRAVENOUS
  Filled 2013-01-07: qty 200

## 2013-01-07 MED ORDER — MIDAZOLAM HCL 5 MG/5ML IJ SOLN
INTRAMUSCULAR | Status: DC | PRN
  Administered 2013-01-07: 3 mg via INTRAVENOUS
  Administered 2013-01-07: 4 mg via INTRAVENOUS
  Administered 2013-01-07: 2 mg via INTRAVENOUS
  Administered 2013-01-07: 1 mg via INTRAVENOUS
  Administered 2013-01-07: 2 mg via INTRAVENOUS

## 2013-01-07 MED ORDER — VECURONIUM BROMIDE 10 MG IV SOLR
INTRAVENOUS | Status: DC | PRN
  Administered 2013-01-07 (×2): 10 mg via INTRAVENOUS

## 2013-01-07 MED ORDER — METOPROLOL TARTRATE 1 MG/ML IV SOLN: 2.5000 mg | INTRAVENOUS | Status: DC | PRN

## 2013-01-07 MED ORDER — 0.9 % SODIUM CHLORIDE (POUR BTL) OPTIME
TOPICAL | Status: DC | PRN
  Administered 2013-01-07: 1000 mL

## 2013-01-07 MED ORDER — MILRINONE IN DEXTROSE 20 MG/100ML IV SOLN
0.3750 ug/kg/min | INTRAVENOUS | Status: AC
  Administered 2013-01-07: .3 ug/kg/min via INTRAVENOUS
  Filled 2013-01-07: qty 100

## 2013-01-07 MED ORDER — BISACODYL 10 MG RE SUPP: 10.0000 mg | Freq: Every day | RECTAL | Status: DC

## 2013-01-07 MED ORDER — CHLORHEXIDINE GLUCONATE 4 % EX LIQD: 30.0000 mL | CUTANEOUS | Status: DC

## 2013-01-07 MED ORDER — LACTATED RINGERS IV SOLN
INTRAVENOUS | Status: DC | PRN
  Administered 2013-01-07 (×4): via INTRAVENOUS

## 2013-01-07 MED ORDER — SODIUM CHLORIDE 0.9 % IJ SOLN
OROMUCOSAL | Status: DC | PRN
  Administered 2013-01-07: 09:00:00 via TOPICAL

## 2013-01-07 MED ORDER — AMIODARONE HCL IN DEXTROSE 360-4.14 MG/200ML-% IV SOLN
60.0000 mg/h | INTRAVENOUS | Status: AC
  Administered 2013-01-07 (×2): 60 mg/h via INTRAVENOUS
  Filled 2013-01-07: qty 200

## 2013-01-07 MED ORDER — AMIODARONE HCL IN DEXTROSE 360-4.14 MG/200ML-% IV SOLN
INTRAVENOUS | Status: AC
  Administered 2013-01-07: 60 mg/h
  Filled 2013-01-07: qty 200

## 2013-01-07 MED ORDER — SODIUM CHLORIDE 0.9 % IV SOLN: 250.0000 mL | INTRAVENOUS | Status: DC

## 2013-01-07 MED ORDER — SODIUM CHLORIDE 0.9 % IV SOLN
INTRAVENOUS | Status: DC
  Administered 2013-01-07: 10 mL/h via INTRAVENOUS

## 2013-01-07 MED ORDER — ROCURONIUM BROMIDE 100 MG/10ML IV SOLN
INTRAVENOUS | Status: DC | PRN
  Administered 2013-01-07 (×4): 50 mg via INTRAVENOUS

## 2013-01-07 MED ORDER — METOPROLOL TARTRATE 12.5 MG HALF TABLET: 12.5000 mg | ORAL_TABLET | Freq: Once | ORAL | Status: DC

## 2013-01-07 MED ORDER — MIDAZOLAM HCL 2 MG/2ML IJ SOLN: 2.0000 mg | INTRAMUSCULAR | Status: DC | PRN

## 2013-01-07 MED ORDER — HEMOSTATIC AGENTS (NO CHARGE) OPTIME
TOPICAL | Status: DC | PRN
  Administered 2013-01-07: 1 via TOPICAL

## 2013-01-07 MED ORDER — ASPIRIN 81 MG PO CHEW: 324.0000 mg | CHEWABLE_TABLET | Freq: Every day | ORAL | Status: DC

## 2013-01-07 MED ORDER — ALBUMIN HUMAN 5 % IV SOLN
INTRAVENOUS | Status: DC | PRN
  Administered 2013-01-07: 14:00:00 via INTRAVENOUS

## 2013-01-07 MED ORDER — HEPARIN SODIUM (PORCINE) 1000 UNIT/ML IJ SOLN
INTRAMUSCULAR | Status: DC | PRN
  Administered 2013-01-07: 30000 [IU] via INTRAVENOUS

## 2013-01-07 MED ORDER — ACETAMINOPHEN 160 MG/5ML PO SOLN
1000.0000 mg | Freq: Four times a day (QID) | ORAL | Status: DC
  Administered 2013-01-07: 1000 mg
  Filled 2013-01-07: qty 40.6

## 2013-01-07 MED ORDER — OXYCODONE HCL 5 MG PO TABS
5.0000 mg | ORAL_TABLET | ORAL | Status: DC | PRN
  Administered 2013-01-08 (×2): 10 mg via ORAL
  Administered 2013-01-08: 5 mg via ORAL
  Administered 2013-01-09: 10 mg via ORAL
  Filled 2013-01-07: qty 2
  Filled 2013-01-07: qty 1
  Filled 2013-01-07 (×2): qty 2

## 2013-01-07 MED ORDER — MILRINONE LOAD VIA INFUSION
INTRAVENOUS | Status: DC | PRN
  Administered 2013-01-07: 4900 ug via INTRAVENOUS

## 2013-01-07 MED ORDER — MIDAZOLAM HCL 2 MG/2ML IJ SOLN
INTRAMUSCULAR | Status: AC
  Filled 2013-01-07: qty 2

## 2013-01-07 MED ORDER — METOPROLOL TARTRATE 50 MG PO TABS
75.0000 mg | ORAL_TABLET | Freq: Once | ORAL | Status: AC
  Administered 2013-01-07: 75 mg via ORAL

## 2013-01-07 MED ORDER — ATORVASTATIN CALCIUM 20 MG PO TABS
20.0000 mg | ORAL_TABLET | Freq: Every day | ORAL | Status: DC
  Administered 2013-01-08 – 2013-01-11 (×4): 20 mg via ORAL
  Filled 2013-01-07 (×5): qty 1

## 2013-01-07 MED ORDER — ASPIRIN EC 325 MG PO TBEC
325.0000 mg | DELAYED_RELEASE_TABLET | Freq: Every day | ORAL | Status: DC
  Administered 2013-01-08: 325 mg via ORAL
  Filled 2013-01-07 (×2): qty 1

## 2013-01-07 MED ORDER — DOPAMINE-DEXTROSE 3.2-5 MG/ML-% IV SOLN
INTRAVENOUS | Status: DC | PRN
  Administered 2013-01-07: 3 ug/kg/min via INTRAVENOUS

## 2013-01-07 MED ORDER — FENTANYL CITRATE 0.05 MG/ML IJ SOLN
INTRAMUSCULAR | Status: AC
  Administered 2013-01-07 (×3): 100 ug via INTRAVENOUS
  Administered 2013-01-07: 250 ug via INTRAVENOUS
  Administered 2013-01-07: 50 ug via INTRAVENOUS
  Administered 2013-01-07: 100 ug via INTRAVENOUS
  Administered 2013-01-07: 250 ug via INTRAVENOUS
  Administered 2013-01-07 (×2): 150 ug via INTRAVENOUS
  Filled 2013-01-07: qty 2

## 2013-01-07 MED ORDER — DEXTROSE 5 % IV SOLN
1.5000 g | Freq: Two times a day (BID) | INTRAVENOUS | Status: DC
  Administered 2013-01-07 – 2013-01-09 (×4): 1.5 g via INTRAVENOUS
  Filled 2013-01-07 (×6): qty 1.5

## 2013-01-07 MED ORDER — FENTANYL CITRATE 0.05 MG/ML IJ SOLN
INTRAMUSCULAR | Status: DC | PRN
  Administered 2013-01-07: 50 ug via INTRAVENOUS

## 2013-01-07 MED ORDER — BISACODYL 5 MG PO TBEC
10.0000 mg | DELAYED_RELEASE_TABLET | Freq: Every day | ORAL | Status: DC
  Administered 2013-01-08: 10 mg via ORAL
  Filled 2013-01-07: qty 2

## 2013-01-07 SURGICAL SUPPLY — 138 items
ADH SKN CLS APL DERMABOND .7 (GAUZE/BANDAGES/DRESSINGS) ×2
ATRICLIP EXCLUSION 45 FLEX HDL (Clip) ×1 IMPLANT
ATTRACTOMAT 16X20 MAGNETIC DRP (DRAPES) ×3 IMPLANT
BAG DECANTER FOR FLEXI CONT (MISCELLANEOUS) ×3 IMPLANT
BANDAGE ELASTIC 4 VELCRO ST LF (GAUZE/BANDAGES/DRESSINGS) ×1 IMPLANT
BANDAGE ELASTIC 6 VELCRO ST LF (GAUZE/BANDAGES/DRESSINGS) ×1 IMPLANT
BANDAGE GAUZE ELAST BULKY 4 IN (GAUZE/BANDAGES/DRESSINGS) ×1 IMPLANT
BLADE STERNUM SYSTEM 6 (BLADE) ×3 IMPLANT
BLADE SURG 11 STRL SS (BLADE) ×1 IMPLANT
BLADE SURG ROTATE 9660 (MISCELLANEOUS) IMPLANT
CANISTER SUCTION 2500CC (MISCELLANEOUS) ×3 IMPLANT
CANN PRFSN .5XCNCT 15X34-48 (MISCELLANEOUS) ×2
CANN PRFSN 3/8X14X24FR PCFC (MISCELLANEOUS) ×2
CANN PRFSN 3/8XCNCT ST RT ANG (MISCELLANEOUS) ×2
CANNULA AORTIC HI-FLOW 6.5M20F (CANNULA) ×3 IMPLANT
CANNULA PRFSN .5XCNCT 15X34-48 (MISCELLANEOUS) ×2 IMPLANT
CANNULA PRFSN 3/8X14X24FR PCFC (MISCELLANEOUS) IMPLANT
CANNULA PRFSN 3/8XCNCT RT ANG (MISCELLANEOUS) IMPLANT
CANNULA VEN 2 STAGE (MISCELLANEOUS) ×3
CANNULA VEN MTL TIP RT (MISCELLANEOUS) ×6
CARDIOBLATE CARDIAC ABLATION (MISCELLANEOUS)
CATH CPB KIT GERHARDT (MISCELLANEOUS) ×3 IMPLANT
CATH HEART VENT LEFT (CATHETERS) IMPLANT
CATH ROBINSON RED A/P 18FR (CATHETERS) ×2 IMPLANT
CATH THORACIC 28FR (CATHETERS) ×3 IMPLANT
CATH THORACIC 36FR (CATHETERS) IMPLANT
CATH THORACIC 36FR RT ANG (CATHETERS) IMPLANT
CLAMP ISOLATOR SYNERGY LG (MISCELLANEOUS) ×1 IMPLANT
CLIP TI MEDIUM 24 (CLIP) IMPLANT
CLIP TI WIDE RED SMALL 24 (CLIP) IMPLANT
CLOTH BEACON ORANGE TIMEOUT ST (SAFETY) ×3 IMPLANT
CONN ST 1/4X3/8  BEN (MISCELLANEOUS) ×1
CONN ST 1/4X3/8 BEN (MISCELLANEOUS) IMPLANT
COVER SURGICAL LIGHT HANDLE (MISCELLANEOUS) ×3 IMPLANT
CRADLE DONUT ADULT HEAD (MISCELLANEOUS) ×3 IMPLANT
DERMABOND ADVANCED (GAUZE/BANDAGES/DRESSINGS) ×1
DERMABOND ADVANCED .7 DNX12 (GAUZE/BANDAGES/DRESSINGS) IMPLANT
DEVICE CARDIOBLATE CARDIAC ABL (MISCELLANEOUS) IMPLANT
DRAIN CHANNEL 28F RND 3/8 FF (WOUND CARE) ×3 IMPLANT
DRAPE CARDIOVASCULAR INCISE (DRAPES) ×3
DRAPE SLUSH/WARMER DISC (DRAPES) ×3 IMPLANT
DRAPE SRG 135X102X78XABS (DRAPES) ×2 IMPLANT
DRSG COVADERM 4X14 (GAUZE/BANDAGES/DRESSINGS) ×1 IMPLANT
ELECT BLADE 4.0 EZ CLEAN MEGAD (MISCELLANEOUS) ×3
ELECT REM PT RETURN 9FT ADLT (ELECTROSURGICAL) ×6
ELECTRODE BLDE 4.0 EZ CLN MEGD (MISCELLANEOUS) ×2 IMPLANT
ELECTRODE REM PT RTRN 9FT ADLT (ELECTROSURGICAL) ×4 IMPLANT
GLOVE BIO SURGEON STRL SZ 6 (GLOVE) ×4 IMPLANT
GLOVE BIO SURGEON STRL SZ 6.5 (GLOVE) ×11 IMPLANT
GLOVE BIO SURGEON STRL SZ7 (GLOVE) ×3 IMPLANT
GLOVE BIO SURGEON STRL SZ7.5 (GLOVE) IMPLANT
GLOVE BIOGEL PI IND STRL 6 (GLOVE) IMPLANT
GLOVE BIOGEL PI IND STRL 6.5 (GLOVE) IMPLANT
GLOVE BIOGEL PI IND STRL 7.0 (GLOVE) IMPLANT
GLOVE BIOGEL PI INDICATOR 6 (GLOVE) ×2
GLOVE BIOGEL PI INDICATOR 6.5 (GLOVE)
GLOVE BIOGEL PI INDICATOR 7.0 (GLOVE)
GOWN STRL NON-REIN LRG LVL3 (GOWN DISPOSABLE) ×12 IMPLANT
HEMASHIELD FINESSE CARDIO (Vascular Products) ×3 IMPLANT
HEMOSTAT POWDER SURGIFOAM 1G (HEMOSTASIS) ×9 IMPLANT
HEMOSTAT SURGICEL 2X14 (HEMOSTASIS) ×3 IMPLANT
INSERT FOGARTY 61MM (MISCELLANEOUS) IMPLANT
INSERT FOGARTY XLG (MISCELLANEOUS) IMPLANT
KIT BASIN OR (CUSTOM PROCEDURE TRAY) ×3 IMPLANT
KIT ROOM TURNOVER OR (KITS) ×3 IMPLANT
KIT SUCTION CATH 14FR (SUCTIONS) ×7 IMPLANT
KIT TOURNIQUET VASCULAR (MISCELLANEOUS) ×1 IMPLANT
KIT VASOVIEW W/TROCAR VH 2000 (KITS) ×3 IMPLANT
LEAD PACING MYOCARDI (MISCELLANEOUS) ×3 IMPLANT
LINE VENT (MISCELLANEOUS) ×1 IMPLANT
LOOP VESSEL SUPERMAXI WHITE (MISCELLANEOUS) ×1 IMPLANT
MARKER GRAFT CORONARY BYPASS (MISCELLANEOUS) ×9 IMPLANT
NDL 27GX1/2 REG BEVEL ECLIP (NEEDLE) IMPLANT
NEEDLE 27GX1/2 REG BEVEL ECLIP (NEEDLE) ×3 IMPLANT
NS IRRIG 1000ML POUR BTL (IV SOLUTION) ×17 IMPLANT
PACK OPEN HEART (CUSTOM PROCEDURE TRAY) ×3 IMPLANT
PAD ARMBOARD 7.5X6 YLW CONV (MISCELLANEOUS) ×6 IMPLANT
PAD ELECT DEFIB RADIOL ZOLL (MISCELLANEOUS) ×3 IMPLANT
PATCH HEMASHIELD FINESS CARDIO (Vascular Products) IMPLANT
PENCIL BUTTON HOLSTER BLD 10FT (ELECTRODE) ×3 IMPLANT
PROBE CRYO2-ABLATION MALLABLE (MISCELLANEOUS) ×1 IMPLANT
PUNCH AORTIC ROTATE  4.5MM 8IN (MISCELLANEOUS) ×1 IMPLANT
PUNCH AORTIC ROTATE 4.0MM (MISCELLANEOUS) IMPLANT
PUNCH AORTIC ROTATE 4.5MM 8IN (MISCELLANEOUS) IMPLANT
PUNCH AORTIC ROTATE 5MM 8IN (MISCELLANEOUS) IMPLANT
SET CARDIOPLEGIA MPS 5001102 (MISCELLANEOUS) ×1 IMPLANT
SOLUTION ANTI FOG 6CC (MISCELLANEOUS) IMPLANT
SPONGE GAUZE 4X4 12PLY (GAUZE/BANDAGES/DRESSINGS) ×6 IMPLANT
SPONGE LAP 18X18 X RAY DECT (DISPOSABLE) ×2 IMPLANT
SPONGE LAP 4X18 X RAY DECT (DISPOSABLE) IMPLANT
SUCKER INTRACARDIAC WEIGHTED (SUCKER) ×1 IMPLANT
SUT BONE WAX W31G (SUTURE) ×3 IMPLANT
SUT ETHIBOND 2 0 SH (SUTURE) ×3
SUT ETHIBOND 2 0 SH 36X2 (SUTURE) IMPLANT
SUT MNCRL AB 4-0 PS2 18 (SUTURE) ×1 IMPLANT
SUT PROLENE 3 0 RB 1 (SUTURE) ×1 IMPLANT
SUT PROLENE 3 0 SH DA (SUTURE) IMPLANT
SUT PROLENE 3 0 SH1 36 (SUTURE) ×10 IMPLANT
SUT PROLENE 4 0 RB 1 (SUTURE) ×21
SUT PROLENE 4 0 SH DA (SUTURE) IMPLANT
SUT PROLENE 4 0 TF (SUTURE) ×9 IMPLANT
SUT PROLENE 4-0 RB1 .5 CRCL 36 (SUTURE) IMPLANT
SUT PROLENE 5 0 C 1 36 (SUTURE) IMPLANT
SUT PROLENE 6 0 C 1 30 (SUTURE) ×1 IMPLANT
SUT PROLENE 6 0 CC (SUTURE) ×7 IMPLANT
SUT PROLENE 7 0 BV 1 (SUTURE) IMPLANT
SUT PROLENE 7 0 BV1 MDA (SUTURE) ×3 IMPLANT
SUT PROLENE 7.0 RB 3 (SUTURE) IMPLANT
SUT PROLENE 8 0 BV175 6 (SUTURE) IMPLANT
SUT SILK  1 MH (SUTURE)
SUT SILK 1 MH (SUTURE) IMPLANT
SUT SILK 2 0 SH CR/8 (SUTURE) ×1 IMPLANT
SUT SILK 3 0 SH CR/8 (SUTURE) IMPLANT
SUT STEEL 6MS V (SUTURE) ×4 IMPLANT
SUT STEEL STERNAL CCS#1 18IN (SUTURE) IMPLANT
SUT STEEL SZ 6 DBL 3X14 BALL (SUTURE) ×3 IMPLANT
SUT VIC AB 1 CTX 18 (SUTURE) ×6 IMPLANT
SUT VIC AB 1 CTX 36 (SUTURE)
SUT VIC AB 1 CTX36XBRD ANBCTR (SUTURE) IMPLANT
SUT VIC AB 2-0 CT1 27 (SUTURE)
SUT VIC AB 2-0 CT1 TAPERPNT 27 (SUTURE) IMPLANT
SUT VIC AB 2-0 CTX 27 (SUTURE) IMPLANT
SUT VIC AB 3-0 SH 27 (SUTURE) ×3
SUT VIC AB 3-0 SH 27X BRD (SUTURE) IMPLANT
SUT VIC AB 3-0 X1 27 (SUTURE) IMPLANT
SUT VICRYL 4-0 PS2 18IN ABS (SUTURE) IMPLANT
SUTURE E-PAK OPEN HEART (SUTURE) ×3 IMPLANT
SYS ATRICLIP LAA EXCLUSION 45 (CLIP) IMPLANT
SYSTEM SAHARA CHEST DRAIN ATS (WOUND CARE) ×3 IMPLANT
TOWEL OR 17X24 6PK STRL BLUE (TOWEL DISPOSABLE) ×6 IMPLANT
TOWEL OR 17X26 10 PK STRL BLUE (TOWEL DISPOSABLE) ×6 IMPLANT
TRAY FOLEY IC TEMP SENS 14FR (CATHETERS) ×3 IMPLANT
TUBE FEEDING 8FR 16IN STR KANG (MISCELLANEOUS) ×3 IMPLANT
TUBE SUCT INTRACARD DLP 20F (MISCELLANEOUS) ×3 IMPLANT
TUBING INSUFFLATION 10FT LAP (TUBING) ×3 IMPLANT
UNDERPAD 30X30 INCONTINENT (UNDERPADS AND DIAPERS) ×3 IMPLANT
VENT LEFT HEART 12002 (CATHETERS) ×3
WATER STERILE IRR 1000ML POUR (IV SOLUTION) ×6 IMPLANT

## 2013-01-07 NOTE — Progress Notes (Signed)
Switched pt back to full support due to no patient effort at times. RN aware. Will attempt at a later time.

## 2013-01-07 NOTE — Progress Notes (Signed)
2440-1027 pt with burst of wide complex ST. sbp remained >90. Resolved, now SR with PVCs/PACs. Amiodarone gtt infusing. Will continue to monitor. Koren Bound

## 2013-01-07 NOTE — Transfer of Care (Signed)
Immediate Anesthesia Transfer of Care Note  Patient: Aaron Jones  Procedure(s) Performed: Procedure(s) with comments: CORONARY ARTERY BYPASS GRAFTING (CABG) (N/A) - Times1 using endoscopically harvested saphenous vein MAZE (N/A) VENTRICULAR SEPTAL DEFECT (VSD) REPAIR (N/A) INTRAOPERATIVE TRANSESOPHAGEAL ECHOCARDIOGRAM (N/A) CLIPPING OF ATRIAL APPENDAGE (N/A)  Patient Location: ICU  Anesthesia Type:General  Level of Consciousness: Patient remains intubated per anesthesia plan  Airway & Oxygen Therapy: Patient remains intubated per anesthesia plan and Patient placed on Ventilator (see vital sign flow sheet for setting)  Post-op Assessment: Post -op Vital signs reviewed and stable  Post vital signs: Reviewed and stable  Complications: No apparent anesthesia complications

## 2013-01-07 NOTE — OR Nursing (Signed)
SICU first call @ 1433

## 2013-01-07 NOTE — Progress Notes (Signed)
  Echocardiogram Echocardiogram Transesophageal has been performed.  Vikram Tillett 01/07/2013, 8:16 AM

## 2013-01-07 NOTE — Anesthesia Procedure Notes (Addendum)
Procedure Name: Intubation Date/Time: 01/07/2013 7:50 AM Performed by: Elon Alas Pre-anesthesia Checklist: Patient identified, Timeout performed, Suction available, Emergency Drugs available and Patient being monitored Patient Re-evaluated:Patient Re-evaluated prior to inductionOxygen Delivery Method: Circle system utilized Preoxygenation: Pre-oxygenation with 100% oxygen Intubation Type: IV induction Ventilation: Mask ventilation without difficulty Laryngoscope Size: Mac and 4 Tube type: Oral Tube size: 8.0 mm Number of attempts: 1 Airway Equipment and Method: Stylet Placement Confirmation: positive ETCO2,  ETT inserted through vocal cords under direct vision and breath sounds checked- equal and bilateral Secured at: 24 cm Tube secured with: Tape Dental Injury: Teeth and Oropharynx as per pre-operative assessment     PA catheter:  Routine monitors. Timeout, sterile prep, drape, FBP R neck.  Trendelenburg position.  1% Lido local, finder and trocar RIJ 1st pass with US guidance.  Cordis placed over J wire. PA catheter in easily.  Sterile dressing applied.  Patient tolerated well, VSS.  Sandford Craze, MD   06:20-06:30

## 2013-01-07 NOTE — OR Nursing (Signed)
Volunteer called @ 1420 to notify patient off pump

## 2013-01-07 NOTE — Progress Notes (Signed)
Patient ID: Aaron Jones, male   DOB: 1946/03/10, 67 y.o.   MRN: [id number] EVENING ROUNDS NOTE :     [address]       [name] [address]             [phone]                 Day of Surgery Procedure(s) (LRB): CORONARY ARTERY BYPASS GRAFTING (CABG) (N/A) MAZE (N/A) VENTRICULAR SEPTAL DEFECT (VSD) REPAIR (N/A) INTRAOPERATIVE TRANSESOPHAGEAL ECHOCARDIOGRAM (N/A) CLIPPING OF ATRIAL APPENDAGE (N/A)  Total Length of Stay:  LOS: 0 days  BP 97/52  Pulse 71  Temp(Src) 97.2 F (36.2 C) (Core (Comment))  Resp 0  Ht 6' (1.829 m)  Wt 215 lb 2 oz (97.58 kg)  BMI 29.17 kg/m2  SpO2 100%  .Intake/Output     08/04 0701 - 08/05 0700 08/05 0701 - 08/06 0700   I.V. (mL/kg)  2317 (23.7)   Blood  1479   NG/GT  30   IV Piggyback  960   Total Intake(mL/kg)  4786 (49)   Urine (mL/kg/hr)  980 (0.9)   Blood  2400 (2.2)   Chest Tube  80 (0.1)   Total Output   3460   Net   +1326          . sodium chloride 20 mL/hr (01/07/13 1604)  . sodium chloride 10 mL/hr (01/07/13 1604)  . [START ON 01/08/2013] sodium chloride    . amiodarone (NEXTERONE PREMIX) 360 mg/200 mL dextrose 60 mg/hr (01/07/13 1625)   Followed by  . amiodarone (NEXTERONE PREMIX) 360 mg/200 mL dextrose    . dexmedetomidine 0.5 mcg/kg/hr (01/07/13 1700)  . DOPamine 3 mcg/kg/min (01/07/13 1545)  . insulin (NOVOLIN-R) infusion 0.5 Units/hr (01/07/13 1700)  . lactated ringers 20 mL/hr (01/07/13 1717)  . milrinone 0.3 mcg/kg/min (01/07/13 1600)  . nitroGLYCERIN Stopped (01/07/13 1545)  . phenylephrine (NEO-SYNEPHRINE) Adult infusion Stopped (01/07/13 1800)     Lab Results  Component Value Date   WBC 13.3* 01/07/2013   HGB 12.7* 01/07/2013   HCT 37.4* 01/07/2013   PLT 75* 01/07/2013   GLUCOSE 128* 01/07/2013   ALT 24 01/03/2013   AST 20 01/03/2013   NA 144 01/07/2013   K 3.9 01/07/2013   CL 107 01/03/2013   CREATININE 0.84 01/03/2013   BUN 13 01/03/2013   CO2 20 01/03/2013   TSH 1.571 11/17/2012   INR 1.51* 01/07/2013   HGBA1C 7.7* 01/03/2013   Starting to wake up, not bleeding Sinus rhythm with frequent pac on aminodarone  wean dopamine off  Delight Ovens MD  Beeper [phone] Office [phone] 01/07/2013 6:12 PM

## 2013-01-07 NOTE — Progress Notes (Signed)
Amiodarone Drug - Drug Interaction Consult Note  Recommendations: No dose adjustments recommended at this time -- just increased monitoring for potential of myopathy with atorvastatin and increased risk of bradycardia, AV block, and myocardial depression with concurrent use of metoprolol.   Amiodarone is metabolized by the cytochrome P450 system and therefore has the potential to cause many drug interactions. Amiodarone has an average plasma half-life of 50 days (range 20 to 100 days).   There is potential for drug interactions to occur several weeks or months after stopping treatment and the onset of drug interactions may be slow after initiating amiodarone.   [x]  Statins: Increased risk of myopathy. Simvastatin- restrict dose to 20mg  daily. Other statins: counsel patients to report any muscle pain or weakness immediately.  []  Anticoagulants: Amiodarone can increase anticoagulant effect. Consider warfarin dose reduction. Patients should be monitored closely and the dose of anticoagulant altered accordingly, remembering that amiodarone levels take several weeks to stabilize.  []  Antiepileptics: Amiodarone can increase plasma concentration of phenytoin, the dose should be reduced. Note that small changes in phenytoin dose can result in large changes in levels. Monitor patient and counsel on signs of toxicity.  [x]  Beta blockers: increased risk of bradycardia, AV block and myocardial depression. Sotalol - avoid concomitant use.  []   Calcium channel blockers (diltiazem and verapamil): increased risk of bradycardia, AV block and myocardial depression.  []   Cyclosporine: Amiodarone increases levels of cyclosporine. Reduced dose of cyclosporine is recommended.  []  Digoxin dose should be halved when amiodarone is started.  []  Diuretics: increased risk of cardiotoxicity if hypokalemia occurs.  []  Oral hypoglycemic agents (glyburide, glipizide, glimepiride): increased risk of hypoglycemia. Patient's  glucose levels should be monitored closely when initiating amiodarone therapy.   []  Drugs that prolong the QT interval:  Torsades de pointes risk may be increased with concurrent use - avoid if possible.  Monitor QTc, also keep magnesium/potassium WNL if concurrent therapy can't be avoided. Marland Kitchen Antibiotics: e.g. fluoroquinolones, erythromycin. . Antiarrhythmics: e.g. quinidine, procainamide, disopyramide, sotalol. . Antipsychotics: e.g. phenothiazines, haloperidol.  . Lithium, tricyclic antidepressants, and methadone.   Thank You,  Georgina Pillion, PharmD, BCPS Clinical Pharmacist Pager: [phone] 01/07/2013 4:38 PM

## 2013-01-07 NOTE — Progress Notes (Signed)
Attempted to start wean. Pt had decreased RR and VT, RT switched back to original settings

## 2013-01-07 NOTE — H&P (Signed)
Patient ID: Aaron Jones, male   DOB: [birth year], 67 y.o.   MRN: [id number]      [address]       [place] [address]             [phone]        Ousmane Seeman [facility] Medical Record #[id number] Date of Birth: 10-Feb-1946  Referring: Dr Velta Addison Primary Care: Elmo Putt, MD  Chief Complaint:   Admitted with rapid afib new onset   History of Present Illness:     67 y.o. male with no history of CAD History of diabetes, hypertension, hypercholesterolemia. History of  tachycardia in 2013, possible SVT started  Cardizem. Follows with cardiologist, Dr. Lady Gary in [place].  In June weeks  he noticed he would become sob with climbing stairs and with doing yard work.  Symptoms improved with rest.  He has noticed increased ankle swelling and onset of PND. Symptoms progressed he developed a tightening sensation around his chest and abdomen along with some muscle cramping In adductors with increased activity. The day of admission the tightening sensation in his chest and abdomen progressed to a heavy pressure that he rated a 6/10. He went to urgent care and then was sent to [facility], and [facility] and admitted on the hospitalist service .Patient reports that his pain  resolved as soon as he received cardizem. He was found to be in rapid AF  Further evaluation included cath, TEE and dental evaluation. He has had poor dentition taken care of.      Current Activity/ Functional Status: Patient is independent with mobility/ambulation, transfers, ADL's, IADL's.   Zubrod Score: At the time of surgery this patient's most appropriate activity status/level should be described as: []  Normal activity, no symptoms [x]  Symptoms, fully ambulatory []  Symptoms, in bed less than or equal to 50% of the time []  Symptoms, in bed greater than 50% of the time but less than 100% []  Bedridden []  Moribund  Past Medical History  Diagnosis Date  . Diabetes mellitus  without complication   . GERD (gastroesophageal reflux disease)   . Hyperlipidemia   . VSD (ventricular septal defect)   . Tachycardia, paroxysmal May 2013    Possible SVT  . Abnormal heart rhythms 11/2012    A-Fib- sees Dr Lady GaryGavin Potters   . Hypertension     PCP- Alena Bills, phone; [phone]  . Sleep apnea     [facility]- in process of being evaluated now, states 12 yrs. ago was on CPAP but after having his deviated septum repaired, he had put the CPAP in storage. Pt. will get a new machine soon.   Marland Kitchen Heart murmur   . Chronic kidney disease     stones passed spontaneously    Past Surgical History  Procedure Laterality Date  . Nasal septum surgery  2004  . Tee without cardioversion N/A 11/21/2012    Procedure: TRANSESOPHAGEAL ECHOCARDIOGRAM (TEE);  Surgeon: Dolores Patty, MD;  Location: [facility] ENDOSCOPY;  Service: Cardiovascular;  Laterality: N/A;  . Cardiac catheterization  > 5 yr ago    Done at [facility], reportedly clean  . Cardiac catheterization  11/2012  . Multiple extractions with alveoloplasty N/A 12/11/2012    Procedure: Extraction of tooth #'s 2,3,4,5,14,15,17,21,22,23,24,25,26,27,32 wioth alveoloplasty and bialteral fibrous tuberosity reductions.;  Surgeon: Charlynne Pander, DDS;  Location: [facility] ORS;  Service: Oral Surgery;  Laterality: N/A;  . Eye surgery  1990's    L eye  History  Smoking status  . Never Smoker   Smokeless tobacco  . Never Used    History  Alcohol Use No    History   Social History  . Marital Status: Married    Spouse Name: N/A    Number of Children: N/A  . 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 Smoking status: Never Smoker   . Smokeless tobacco: Never Used  . Alcohol Use: No  . Drug Use: No  . Sexually Active: No   Other Topics Concern  . Not on file   Social History Narrative   Still works full time. Works around the yard. Lives with wife.  2 sisters, neither with cardiac issues.    Allergies  Allergen Reactions  . Biaxin (Clarithromycin) Nausea Only    Current Facility-Administered Medications  Medication Dose Route Frequency Provider Last Rate Last Dose  . aminocaproic acid (AMICAR) 10 g in sodium chloride 0.9 % 100 mL infusion   Intravenous To OR Delight Ovens, MD      . cefUROXime (ZINACEF) 1.5 g in dextrose 5 % 50 mL IVPB  1.5 g Intravenous To OR Delight Ovens, MD      . cefUROXime (ZINACEF) 750 mg in dextrose 5 % 50 mL IVPB  750 mg Intravenous To OR Delight Ovens, MD      . chlorhexidine (HIBICLENS) 4 % liquid 2 application  30 mL Topical UD Delight Ovens, MD      . dexmedetomidine (PRECEDEX) 400 MCG/100ML infusion  0.1-0.7 mcg/kg/hr Intravenous To OR Delight Ovens, MD      . DOPamine (INTROPIN) 800 mg in dextrose 5 % 250 mL infusion  2-20 mcg/kg/min Intravenous To OR Delight Ovens, MD      . EPINEPHrine (ADRENALIN) 4,000 mcg in dextrose 5 % 250 mL infusion  0.5-20 mcg/min Intravenous To OR Delight Ovens, MD      . fentaNYL (SUBLIMAZE) 0.05 MG/ML injection           . heparin 2,500 Units, papaverine 30 mg in electrolyte-148 (PLASMALYTE-148) 500 mL irrigation   Irrigation To OR Delight Ovens, MD      . heparin 30,000 units/NS 1000 mL solution for CELLSAVER   Other To OR Delight Ovens, MD      . insulin regular (NOVOLIN R,HUMULIN R) 1 Units/mL in sodium chloride 0.9 % 100 mL infusion   Intravenous To OR Delight Ovens, MD      . magnesium sulfate (IV Push/IM) injection 40 mEq  40 mEq Other To OR Delight Ovens, MD      . metoprolol (LOPRESSOR) 50 MG tablet           . metoprolol tartrate (LOPRESSOR) tablet 12.5 mg  12.5 mg Oral Once Delight Ovens, MD      . midazolam (VERSED) 2 MG/2ML injection           . nitroGLYCERIN 0.2 mg/mL in dextrose 5 % infusion  2-200 mcg/min Intravenous To OR Delight Ovens, MD      . phenylephrine (NEO-SYNEPHRINE) 20,000 mcg in dextrose 5 % 250  mL infusion  30-200 mcg/min Intravenous To OR Delight Ovens, MD      . potassium chloride injection 80 mEq  80 mEq Other To OR Delight Ovens, MD      . vancomycin (VANCOCIN) 1,500 mg in  sodium chloride 0.9 % 250 mL IVPB  1,500 mg Intravenous To OR Delight Ovens, MD       Facility-Administered Medications Ordered in Other Encounters  Medication Dose Route Frequency Provider Last Rate Last Dose  . fentaNYL (SUBLIMAZE) injection    Anesthesia Intra-op Adair Laundry, CRNA   50 mcg at 01/07/13 [phone]  . midazolam (VERSED) 5 MG/5ML injection    Anesthesia Intra-op Adair Laundry, CRNA   2 mg at 01/07/13 9604    Prescriptions prior to admission  Medication Sig Dispense Refill  . apixaban (ELIQUIS) 5 MG TABS tablet Take 5 mg by mouth 2 (two) times daily.      Marland Kitchen aspirin 81 MG tablet Take 81 mg by mouth daily with breakfast.      . atorvastatin (LIPITOR) 20 MG tablet Take 20 mg by mouth at bedtime.      Marland Kitchen diltiazem (CARDIZEM CD) 360 MG 24 hr capsule Take 360 mg by mouth daily before breakfast.       . furosemide (LASIX) 40 MG tablet Take 40 mg by mouth daily.       . insulin glargine (LANTUS) 100 units/mL SOLN Inject 18 Units into the skin daily at 10 pm.      . metFORMIN (GLUCOPHAGE-XR) 500 MG 24 hr tablet Take 1,000 mg by mouth 2 (two) times daily.      . metoprolol (LOPRESSOR) 50 MG tablet Take 75 mg by mouth 2 (two) times daily.      Marland Kitchen omeprazole (PRILOSEC) 40 MG capsule Take 40 mg by mouth daily before breakfast.       . potassium chloride SA (K-DUR,KLOR-CON) 20 MEQ tablet Take 20 mEq by mouth daily.      Marland Kitchen acetaminophen (TYLENOL) 500 MG tablet Take 500 mg by mouth every 6 (six) hours as needed for pain.      . Glucose Blood (BLOOD GLUCOSE TEST STRIPS) STRP 1 strip by In Vitro route 2 (two) times daily.  100 each  0    History reviewed. No pertinent family history.   Review of Systems:     Cardiac Review of Systems: Y or N  Chest Pain [ y when in rapid af Laodice.Slates   ]  Resting SOB [ y   ] Exertional SOB  [ y ]  Pollyann Kennedy Milo.Brash  ]   Pedal Edema [ y  ]    Palpitations Cove.Etienne  ] Syncope  [ n ]   Presyncope [ n  ]  General Review of Systems: [Y] = yes [  ]=no Constitional: recent weight change Cove.Etienne  ]; anorexia [  ]; fatigue Cove.Etienne  ]; nausea [  ]; night sweats [  ]; fever [  ]; or chills [  ]                                                               Dental: poor dentition[ yes ]; Last Dentist visit: several years  Eye : blurred vision [  ]; diplopia [   ]; vision changes [n  ];  Amaurosis fugax[n  ]; Resp: cough [  ];  wheezing[  ];  hemoptysis[  ]; shortness of breath[ y ]; paroxysmal nocturnal dyspnea[y  ]; dyspnea on exertion[ y ]; or orthopnea[  ];  GI:  gallstones[  ], vomiting[  ];  dysphagia[  ]; melena[  ];  hematochezia [  ]; heartburn[  ];   Hx of  Colonoscopy[  ]; GU: kidney stones [  ]; hematuria[  ];   dysuria [  ];  nocturia[  ];  history of     obstruction [n  ]; urinary frequency [ n ]             Skin: rash, swelling[  ];, hair loss[  ];  peripheral edema[  ];  or itching[  ]; Musculosketetal: myalgias[  ];  joint swelling[  ];  joint erythema[  ];  joint pain[  ];  back pain[  ];  Heme/Lymph: bruising[  ];  bleeding[ n ];  anemia[  ];  Neuro: TIA[n  ];  headaches[  ];  stroke[  ];  vertigo[  ];  seizures[ n ];   paresthesias[ n ];  difficulty walking[  n];  Psych:depression[n  ]; anxiety[n  ];  Endocrine: diabetes[ n ];  thyroid dysfunction[  ];  Immunizations: Flu [  ]; Pneumococcal[  ];  Other:  Physical Exam: BP 146/94  Pulse 78  Temp(Src) 97.9 F (36.6 C) (Oral)  Resp 20  Ht 6' (1.829 m)  Wt 215 lb 2 oz (97.58 kg)  BMI 29.17 kg/m2  SpO2 99%  General appearance: alert, cooperative, appears older than stated age and no distress Neurologic: intact Heart: regular rate and rhythm and systolic murmur: holosystolic 3/6, blowing throughout the precordium Lungs: clear to auscultation bilaterally Abdomen: soft, non-tender; bowel sounds normal; no masses,  no  organomegaly Extremities: extremities normal, atraumatic, no cyanosis or edema and Homans sign is negative, no sign of DVT No carotid bruits, full femoral, dt and Pt pulses bilaterial  Diagnostic Studies & Laboratory data:     Recent Radiology Findings:  Dg Chest 2 View  01/03/2013   *RADIOLOGY REPORT*  Clinical Data: Coronary artery disease, hypertension  CHEST - 2 VIEW  Comparison: 12/09/2012  Findings: Stable cardiomegaly is identified.  Mild pulmonary venous congestion is seen without evidence of significant edema.  No focal infiltrate is noted.  No bony abnormality is seen.  IMPRESSION: Stable mild venous congestion.  No acute abnormality is noted.   Original Report Authenticated By: Alcide Clever, M.D.         Recent Lab Findings: Lab Results  Component Value Date   WBC 7.1 01/03/2013   HGB 13.8 01/03/2013   HCT 39.5 01/03/2013   PLT 184 01/03/2013   GLUCOSE 104* 01/03/2013   ALT 24 01/03/2013   AST 20 01/03/2013   NA 139 01/03/2013   K 4.0 01/03/2013   CL 107 01/03/2013   CREATININE 0.84 01/03/2013   BUN 13 01/03/2013   CO2 20 01/03/2013   TSH 1.571 11/17/2012   INR 1.05 01/03/2013   HGBA1C 7.7* 01/03/2013   ZHY:QMVHQION:  LEFT VENTRICLE: EF = 50-55%. No regional wall motion abnormalities  INTRAVENTRICULAR SEPTUM: Small to moderate sized perimembranous VSD immediately under AoV with prominent L->R shunting   RIGHT VENTRICLE: Mildly dilated. Mild HK  LEFT ATRIUM: Dilated  LEFT ATRIAL APPENDAGE: No thrombus.  RIGHT ATRIUM: Dilated  AORTIC VALVE: Trileaflet. Mild AI.  MITRAL VALVE: Normal. Mild to moderate MR  TRICUSPID VALVE: Dilated annulus moderate TR  PULMONIC VALVE: Grossly normal. Mild PR  INTERATRIAL SEPTUM: No PFO or ASD.  PERICARDIUM: No effusion  DESCENDING AORTA: Mild plaque   CATH:Cardiac Cath Procedure Note  Indication: CP (in setting of AF  with RVR) and VSD  Procedures performed:  1) Right heart cathererization  2) Selective coronary angiography  3) Left heart catheterization    4) Left ventriculogram  5) Shunt run  Description of procedure:   The risks and indication of the procedure were explained. Consent was signed and placed on the chart. An appropriate timeout was taken prior to the procedure. The right groin was prepped and draped in the routine sterile fashion and anesthetized with 1% local lidocaine.  A 5 FR arterial sheath was placed in the right femoral artery using a modified Seldinger technique. Standard catheters including a JL4, JR4 and angled pigtail were used. All catheter exchanges were made over a wire. A 7 FR venous sheath was placed in the right femoral vein using a modified Seldinger technique. A standard Swan-Ganz catheter was used for the procedure.  Complications: None apparent  Total contrast: 80 cc  Findings:  RA = 19  RV = 45/15/18  PA = 48/24 (34)  PCW = 22 (v = 35)  Fick cardiac output/index = 4.9/2.2  PVR = 1.2 Woods  SVR = 1040  FA sat = 97%  SVC sat = 62%  RA sat = 64%  RV sat = 78%  PA sat = 81%  Qp/QS = 2.1  Ao Pressure: 116/69 (86)  LV Pressure: 106/13/18  There was no signficant gradient across the aortic valve on pullback.  Left main: Normal  LAD: Mild plaque in proximal portion  Ramus: Very large branching vessel. Normal  LCX: Small vessel. normal  RCA: Dominant vessel. 20% mid. 80-90% hazy lesion in ostium of small to moderate -sized PDA (2.25 mm)  LV-gram done in the RAO projection: Ejection fraction = 45% global HK  Assessment:  1. 1v CAD as described above  2. Significantly elevated R-sided filling pressure  3. VSD with evidence of moderate L -> R shunt by Qp/QS ratio  4. EF 45%  Plan/Discussion:  Based on cath numbers VSD appears hemodynamically significant. Will arrange TEE.  He has significant lesion in small-to-moderate sized PDA. CP occurred during rapid AF but uncommon with exertion. I reviewed with Dr. Clifton James lesion is approachable percutaneously but given lack of significant exertional CP and  possible need for CT surgery for VSD will treat medically for now.  Arvilla Meres, MD       Assessment / Plan:    New Onset of  fib with RVR Perimenbrous  VSD with 2:1 shunt and mild mod rvh CAD of small PDA Poor Dentition- this has been treated and multiple extractions have been done, cleared from dental point of view to proceed with cardiac surgery Now with incresed symptoms of CHF, new AFib with dilated RA/RV and greater then 2: 1 shunt I have discussed with the patient  I discussed the planned cardiac surgery with closure of VSD, MAZE and CABG to PDA. The goals risks and alternatives of the planned surgical procedure  have been discussed with the patient in detail. The risks of the procedure including death, infection, stroke, myocardial infarction, bleeding, blood transfusion have all been discussed specifically .  We have specifically discussed the risk of heart block and need for permanent pace maker. Patient is wiling to proceed.   The goals risks and alternatives of the planned surgical procedure VSD closure/MAZE and CABG to PDA have been discussed with the patient in detail. The risks of the procedure including death, infection, stroke, myocardial infarction, bleeding, blood transfusion have all been discussed specifically. The possibility of needing  a permanent pacer has also been discussed. I have quoted Dollene Cleveland a 3 % of perioperative mortality and a complication rate as high as 25 %. The patient's questions have been answered.Rahkim Rabalais is willing  to proceed with the planned procedure.  Delight Ovens MD      [address] [place] [place] [address] Office [phone]   Beeper [phone]  12/27/2012

## 2013-01-07 NOTE — Brief Op Note (Addendum)
01/07/2013  1:38 PM  PATIENT:  Aaron Jones  67 y.o. male  PRE-OPERATIVE DIAGNOSIS:  1. Single vessel coronary Artery Disease 2. Atrial Fibrillation 3.VSD   POST-OPERATIVE DIAGNOSIS:  1. Single vessel coronary Artery Disease 2. Atrial Fibrillation 3.VSD   PROCEDURE:  INTRAOPERATIVE TRANSESOPHAGEAL ECHOCARDIOGRAM, CORONARY ARTERY BYPASS GRAFTING (CABG)x 1 (SVG to PDA) with EVH from right thigh,  Rt and left MAZE, VENTRICULAR SEPTAL DEFECT (VSD) REPAIR (using a dacron patch), Placement of 45mm left Atrial Clip    SURGEON:  Surgeon(s) and Role:    * Delight Ovens, MD - Primary  PHYSICIAN ASSISTANT: Doree Fudge PA-C   ANESTHESIA:   general  EBL:  Total I/O In: 1500 [I.V.:1500] Out: 575 [Urine:575]   DRAINS: Chest Tube(s) in the Mediastinal and pleural spaces    COUNTS CORRECT:  YES  DICTATION: .Dragon Dictation  PLAN OF CARE: Admit to inpatient   PATIENT DISPOSITION:  ICU - intubated and hemodynamically stable.  In sinus rhythm Sats   PA                                    CVP Pre procedure            86%                                   71% Post procedure           73%                                  74%  Delay start of Pharmacological VTE agent (>24hrs) due to surgical blood loss or risk of bleeding: yes  PRE OP WEIGHT:98 kg

## 2013-01-07 NOTE — Anesthesia Preprocedure Evaluation (Addendum)
Anesthesia Evaluation  Patient identified by MRN, date of birth, ID band Patient awake    Reviewed: Allergy & Precautions, H&P , NPO status , Patient's Chart, lab work & pertinent test results  Airway Mallampati: III TM Distance: >3 FB Neck ROM: Full    Dental  (+) Dental Advisory Given, Edentulous Upper and Edentulous Lower   Pulmonary sleep apnea ,          Cardiovascular hypertension, Pt. on medications and Pt. on home beta blockers + CAD and +CHF + dysrhythmias Atrial Fibrillation  Echo 6/14 - LV was mildly dilated with mild diffuse hypokinesis, EF 50%.  There appeared to be a perimembranous VSD. The RV was  mildly dilated with normal systolic function. There was  mild pulmonary hypertension.   Neuro/Psych    GI/Hepatic GERD-  Medicated,  Endo/Other  diabetes, Type 2, Oral Hypoglycemic Agents  Renal/GU Renal disease     Musculoskeletal   Abdominal   Peds  Hematology   Anesthesia Other Findings   Reproductive/Obstetrics                        Anesthesia Physical Anesthesia Plan  ASA: III  Anesthesia Plan: General   Post-op Pain Management:    Induction: Intravenous  Airway Management Planned: Oral ETT  Additional Equipment: Arterial line, CVP, PA Cath and TEE  Intra-op Plan:   Post-operative Plan: Post-operative intubation/ventilation  Informed Consent: I have reviewed the patients History and Physical, chart, labs and discussed the procedure including the risks, benefits and alternatives for the proposed anesthesia with the patient or authorized representative who has indicated his/her understanding and acceptance.   Dental advisory given  Plan Discussed with: Anesthesiologist, Surgeon and CRNA  Anesthesia Plan Comments:        Anesthesia Quick Evaluation

## 2013-01-07 NOTE — Progress Notes (Signed)
Dr Tyrone Sage to bedside with evening rounds. MD notified of irregular HR, pt with burst of ST with wide QRS. MD shown ECG strips, RN questioning ST vs junctional tach. Amiodarone gtt infusing at 1mg /min. Milrinone gtt at 0.49mcg/kg/min. Neosynephrine gtt briefly infused at 70mcg/min, now off. Dopamine gtt at 33mcg/kg/min. SBP currently >120, CI>2. Pacer off. MD ok to wean dopamine gtt for sbp>110/CI>2. FiO2 decreased to 50% from 70%, pt previously on 70% because Po2 on arrival to SICU 60s. Weaning precedex gtt. Will continue to rapid wean on vent as pt tolerates. Aaron Jones

## 2013-01-07 NOTE — Preoperative (Signed)
Beta Blockers   Reason not to administer Beta Blockers:Not Applicable, pt took 8/5

## 2013-01-08 ENCOUNTER — Inpatient Hospital Stay (HOSPITAL_COMMUNITY): Payer: Managed Care, Other (non HMO)

## 2013-01-08 LAB — GLUCOSE, CAPILLARY
Glucose-Capillary: 106 mg/dL — ABNORMAL HIGH (ref 70–99)
Glucose-Capillary: 127 mg/dL — ABNORMAL HIGH (ref 70–99)
Glucose-Capillary: 137 mg/dL — ABNORMAL HIGH (ref 70–99)
Glucose-Capillary: 138 mg/dL — ABNORMAL HIGH (ref 70–99)
Glucose-Capillary: 146 mg/dL — ABNORMAL HIGH (ref 70–99)
Glucose-Capillary: 176 mg/dL — ABNORMAL HIGH (ref 70–99)
Glucose-Capillary: 184 mg/dL — ABNORMAL HIGH (ref 70–99)
Glucose-Capillary: 75 mg/dL (ref 70–99)
Glucose-Capillary: 94 mg/dL (ref 70–99)
Glucose-Capillary: 113 mg/dL — ABNORMAL HIGH (ref 70–99)
Glucose-Capillary: 115 mg/dL — ABNORMAL HIGH (ref 70–99)
Glucose-Capillary: 132 mg/dL — ABNORMAL HIGH (ref 70–99)
Glucose-Capillary: 153 mg/dL — ABNORMAL HIGH (ref 70–99)
Glucose-Capillary: 137 mg/dL — ABNORMAL HIGH (ref 70–99)

## 2013-01-08 LAB — POCT I-STAT 3, ART BLOOD GAS (G3+)
Acid-base deficit: 4 mmol/L — ABNORMAL HIGH (ref 0.0–2.0)
Acid-base deficit: 4 mmol/L — ABNORMAL HIGH (ref 0.0–2.0)
Bicarbonate: 19.1 mEq/L — ABNORMAL LOW (ref 20.0–24.0)
Bicarbonate: 21.1 mEq/L (ref 20.0–24.0)
O2 Saturation: 98 %
O2 Saturation: 97 %
Patient temperature: 37.4
Patient temperature: 37.4
TCO2: 20 mmol/L (ref 0–100)
TCO2: 22 mmol/L (ref 0–100)
pCO2 arterial: 29.2 mmHg — ABNORMAL LOW (ref 35.0–45.0)
pCO2 arterial: 39.2 mmHg (ref 35.0–45.0)
pH, Arterial: 7.425 (ref 7.350–7.450)
pH, Arterial: 7.341 — ABNORMAL LOW (ref 7.350–7.450)
pO2, Arterial: 95 mmHg (ref 80.0–100.0)
pO2, Arterial: 94 mmHg (ref 80.0–100.0)

## 2013-01-08 LAB — BASIC METABOLIC PANEL
Chloride: 108 mEq/L (ref 96–112)
GFR calc Af Amer: >90 mL/min (ref >90)
GFR calc non Af Amer: 89 mL/min — ABNORMAL LOW (ref >90)
Potassium: 4.2 mEq/L (ref 3.5–5.1)
Sodium: 138 mEq/L (ref 135–145)

## 2013-01-08 LAB — PREPARE PLATELET PHERESIS: Unit division: 0

## 2013-01-08 LAB — CBC
HCT: 34.5 % — ABNORMAL LOW (ref 39.0–52.0)
Hemoglobin: 11.5 g/dL — ABNORMAL LOW (ref 13.0–17.0)
MCHC: 35 g/dL (ref 30.0–36.0)
MCHC: 35.4 g/dL (ref 30.0–36.0)
RDW: 12.1 % (ref 11.5–15.5)
RDW: 12.6 % (ref 11.5–15.5)
WBC: 11.2 10*3/uL — ABNORMAL HIGH (ref 4.0–10.5)

## 2013-01-08 LAB — POCT I-STAT, CHEM 8
BUN: 17 mg/dL (ref 6–23)
Calcium, Ion: 1.04 mmol/L — ABNORMAL LOW (ref 1.13–1.30)
Chloride: 103 mEq/L (ref 96–112)
Creatinine, Ser: 1.4 mg/dL — ABNORMAL HIGH (ref 0.50–1.35)
Glucose, Bld: 180 mg/dL — ABNORMAL HIGH (ref 70–99)
HCT: 35 % — ABNORMAL LOW (ref 39.0–52.0)
Hemoglobin: 11.9 g/dL — ABNORMAL LOW (ref 13.0–17.0)
Potassium: 4.5 mEq/L (ref 3.5–5.1)
Sodium: 135 mEq/L (ref 135–145)
TCO2: 20 mmol/L (ref 0–100)

## 2013-01-08 LAB — CREATININE, SERUM: GFR calc non Af Amer: 53 mL/min — ABNORMAL LOW (ref >90)

## 2013-01-08 LAB — MAGNESIUM: Magnesium: 2.3 mg/dL (ref 1.5–2.5)

## 2013-01-08 MED ORDER — INSULIN DETEMIR 100 UNIT/ML ~~LOC~~ SOLN
10.0000 [IU] | Freq: Once | SUBCUTANEOUS | Status: AC
  Administered 2013-01-08: 10 [IU] via SUBCUTANEOUS
  Filled 2013-01-08: qty 0.1

## 2013-01-08 MED ORDER — FUROSEMIDE 10 MG/ML IJ SOLN
40.0000 mg | Freq: Once | INTRAMUSCULAR | Status: AC
  Administered 2013-01-08: 40 mg via INTRAVENOUS
  Filled 2013-01-08: qty 4

## 2013-01-08 MED ORDER — INSULIN ASPART 100 UNIT/ML ~~LOC~~ SOLN: 0.0000 [IU] | SUBCUTANEOUS | Status: DC

## 2013-01-08 MED ORDER — INSULIN DETEMIR 100 UNIT/ML ~~LOC~~ SOLN
10.0000 [IU] | Freq: Once | SUBCUTANEOUS | Status: DC
  Filled 2013-01-08: qty 0.1

## 2013-01-08 MED ORDER — INSULIN DETEMIR 100 UNIT/ML ~~LOC~~ SOLN
10.0000 [IU] | Freq: Every day | SUBCUTANEOUS | Status: DC
  Administered 2013-01-09 – 2013-01-10 (×2): 10 [IU] via SUBCUTANEOUS
  Filled 2013-01-08 (×3): qty 0.1

## 2013-01-08 MED ORDER — INSULIN ASPART 100 UNIT/ML ~~LOC~~ SOLN
0.0000 [IU] | SUBCUTANEOUS | Status: DC
  Administered 2013-01-08 – 2013-01-09 (×5): 2 [IU] via SUBCUTANEOUS

## 2013-01-08 MED FILL — Sodium Chloride IV Soln 0.9%: INTRAVENOUS | Qty: 1000 | Status: AC

## 2013-01-08 MED FILL — Potassium Chloride Inj 2 mEq/ML: INTRAVENOUS | Qty: 40 | Status: AC

## 2013-01-08 MED FILL — Magnesium Sulfate Inj 50%: INTRAMUSCULAR | Qty: 10 | Status: AC

## 2013-01-08 MED FILL — Mannitol IV Soln 20%: INTRAVENOUS | Qty: 500 | Status: AC

## 2013-01-08 MED FILL — Electrolyte-R (PH 7.4) Solution: INTRAVENOUS | Qty: 3000 | Status: AC

## 2013-01-08 MED FILL — Heparin Sodium (Porcine) Inj 1000 Unit/ML: INTRAMUSCULAR | Qty: 30 | Status: AC

## 2013-01-08 MED FILL — Sodium Chloride Irrigation Soln 0.9%: Qty: 3000 | Status: AC

## 2013-01-08 MED FILL — Heparin Sodium (Porcine) Inj 1000 Unit/ML: INTRAMUSCULAR | Qty: 10 | Status: AC

## 2013-01-08 MED FILL — Sodium Bicarbonate IV Soln 8.4%: INTRAVENOUS | Qty: 50 | Status: AC

## 2013-01-08 NOTE — Procedures (Signed)
Extubation Procedure Note  Patient Details:   Name: Aaron Jones DOB: [birth year] MRN: [id number]      Evaluation  O2 sats: stable throughout Complications: No apparent complications Patient did tolerate procedure well. Bilateral Breath Sounds: Clear;Diminished  Ability to speak Yes  NIF -30, VT 1.4L. Able to breath around cuff. Extubated per wean protocol. ABG WLN. Pt extubated to 4L [place]. No stridor. Pt oriented to place. IS performed several times 1250 with good effort.  Fredrich Birks 01/08/2013, 02:15AM

## 2013-01-08 NOTE — Progress Notes (Addendum)
[address]       [name] [address]             [phone]      1 Day Post-Op Procedure(s) (LRB): CORONARY ARTERY BYPASS GRAFTING (CABG) (N/A) MAZE (N/A) VENTRICULAR SEPTAL DEFECT (VSD) REPAIR (N/A) INTRAOPERATIVE TRANSESOPHAGEAL ECHOCARDIOGRAM (N/A) CLIPPING OF ATRIAL APPENDAGE (N/A)  Subjective:  Aaron Jones was extubated overnight.  States he feels okay this morning.  Experiencing some minor incisional discomfort.  Objective: Vital signs in last 24 hours: Temp:  [96.6 F (35.9 C)-99.7 F (37.6 C)] 98.8 F (37.1 C) (08/06 0700) Pulse Rate:  [46-130] 61 (08/06 0700) Cardiac Rhythm:  [-] Junctional rhythm;Normal sinus rhythm (08/06 0700) Resp:  [0-22] 14 (08/06 0700) BP: (72-117)/(33-61) 108/56 mmHg (08/06 0700) SpO2:  [95 %-100 %] 99 % (08/06 0700) Arterial Line BP: (77-164)/(35-57) 135/46 mmHg (08/06 0700) FiO2 (%):  [40 %-70 %] 40 % (08/06 0200) Weight:  [227 lb 8.2 oz (103.2 kg)] 227 lb 8.2 oz (103.2 kg) (08/06 0430)  Hemodynamic parameters for last 24 hours: PAP: (19-47)/(11-23) 28/14 mmHg CO:  [4.1 L/min-7.3 L/min] 5.5 L/min CI:  [1.9 L/min/m2-3.3 L/min/m2] 2.5 L/min/m2  Intake/Output from previous day: 08/05 0701 - 08/06 0700 In: 6986.7 [P.O.:180; I.V.:3597.7; Blood:1479; NG/GT:180; IV Piggyback:1550] Out: 4613 [Urine:1710; Emesis/NG output:75; Blood:2400; Chest Tube:428] Intake/Output this shift: Total I/O In: 60 [I.V.:60] Out: -   General appearance: alert, cooperative and no distress Heart: regular rate and rhythm Lungs: clear to auscultation bilaterally Abdomen: soft, non-tender; bowel sounds normal; no masses,  no organomegaly Extremities: edema trace Wound: clean and dry  Lab Results:  Recent Labs  01/07/13 2200 01/07/13 2201 01/08/13 0330  WBC 11.2*  --  11.2*  HGB 12.2* 11.6* 11.5*  HCT 34.9* 34.0* 32.9*  PLT 85*  --  88*   BMET:  Recent Labs  01/07/13 2201 01/08/13 0330  NA 142 138  K 4.3 4.2  CL 108  108  CO2  --  20  GLUCOSE 184* 127*  BUN 12 14  CREATININE 0.80 0.87  CALCIUM  --  7.5*    PT/INR:  Recent Labs  01/07/13 1600  LABPROT 17.8*  INR 1.51*   ABG    Component Value Date/Time   PHART 7.341* 01/08/2013 0319   HCO3 21.1 01/08/2013 0319   TCO2 22 01/08/2013 0319   ACIDBASEDEF 4.0* 01/08/2013 0319   O2SAT 97.0 01/08/2013 0319   CBG (last 3)   Recent Labs  01/08/13 0003 01/08/13 0101 01/08/13 0755  GLUCAP 146* 138* 113*    Assessment/Plan: S/P Procedure(s) (LRB): CORONARY ARTERY BYPASS GRAFTING (CABG) (N/A) MAZE (N/A) VENTRICULAR SEPTAL DEFECT (VSD) REPAIR (N/A) INTRAOPERATIVE TRANSESOPHAGEAL ECHOCARDIOGRAM (N/A) CLIPPING OF ATRIAL APPENDAGE (N/A)  1. CV- Sinus Huston Foley this morning, questionable ST vs junctional rhythm- wean Dopamine as tolerated, on IV Amiodarone 2. Pulm- no acute issues, tolerating extubation, wean oxygen as tolerated, continue IS 3. Renal- creatinine WNL, volume overloaded, will start Lasix once off drips 4. Expected Blood Loss Anemia- Mild, Hgb 11.5 5. CBGs- controlled on insulin drip. Preop A1c is 7.7, will start Levemir to transition off drip 6. Dispo- patient doing well, follow Progression orders   LOS: 1 day    BARRETT, ERIN 01/08/2013  Awake and neuro intact, no m but with rub Sinus with pac's  I have seen and examined Aaron Jones and agree with the above assessment  and plan.  Delight Ovens MD Beeper [phone] Office [phone] 01/08/2013 9:31 AM

## 2013-01-08 NOTE — Op Note (Signed)
NAMEJEVAN, GAUNT NO.:  [id number]  MEDICAL RECORD NO.:  [id number]  LOCATION:  2309                         FACILITY:  [facility]  PHYSICIAN:  Sheliah Plane, MD    DATE OF BIRTH:  04/29/1946  DATE OF PROCEDURE:  01/07/2013 DATE OF DISCHARGE:                              OPERATIVE REPORT   PREOPERATIVE DIAGNOSIS: 1. 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 2. Coronary artery disease of the posterior descending coronary     artery. 3. Persistent atrial fibrillation.  POSTOPERATIVE DIAGNOSIS: 1. 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 2. Coronary artery disease of the posterior descending coronary     artery. 3. Persistent atrial fibrillation.  SURGICAL PROCEDURE: 1. Closure of perimembranous ventricular septal defect with Dacron     patch. 2. Coronary artery bypass grafting x1 with reverse saphenous vein     graft to the posterior descending. 3. Right and left Maze procedure with bipolar RF and cryo. 4. Placement of 45 mm Accu-Cure clip, left atrial clip. 5. Right endoscopic vein harvesting.  SURGEON:  Sheliah Plane, MD  FIRST ASSISTANT:  Lin Landsman, PA  BRIEF HISTORY:  The patient is a 67 year old male, who presented in July with rapid atrial fibrillation.  Further evaluation revealed a murmur confirmed on TEE to be a perimembranous VSD with left-to-right shunt and an enlarged right ventricle and right atrium.  Cardiac catheterization was performed by Dr. Gala Romney confirming a 2.1-1 shunt and 80% lesion of the posterior descending.  He had depressed LV function in the 40-45% range.  The patient also had poor dentition in the past month he had dental extractions and because of the degree of the shunt persistent atrial fibrillation and coronary occlusive disease, cardiac surgery to close the VSD, Maze procedure, and coronary artery bypass grafting was recommended to the patient.  He agreed and signed  informed consent.  The risks and options of the procedure were discussed in detail including the possibility of requiring a permanent pacemaker.  DESCRIPTION OF PROCEDURE:  With Swan-Ganz and arterial line monitor in place, the patient underwent general endotracheal anesthesia without incident.  Skin in the chest and legs were prepped with Betadine and draped in usual sterile manner.  Appropriate time-out was performed. Using the Guidant endo vein harvesting system, segment of vein was harvested from the right thigh and was of good quality and caliber. Median sternotomy was performed.  The patient was systemically heparinized.  Ascending aorta was cannulated, metal tip right angle cannulas were placed in the superior and inferior vena cava.  A retrograde cardioplegic catheter was placed through a separate site in the right atrium.  On examination it was evident that the right ventricle was enlarged as was the right atrium.  The patient was then placed on cardiopulmonary bypass at 2.4 liters/minute/meter squared. Site of anastomosis on the posterior descending was identified.  The right and left pulmonary veins were encircled, dissected out and red rubber catheters were placed around the pulmonary veins to facilitate the placement of the bipolar, RF device and aortic root cardioplegia needle was introduced into the ascending aorta.  The patient's body temperature was cooled to 32 degrees.  Aortic cross-clamp was applied and 800 mL of cold blood potassium cardioplegia was administered with diastolic arrest of the heart.  Attention was turned first to the left atrial appendage, but no clot was seen in this on TEE.  The Maze were made across the base of left atrial appendage and the left pulmonary veins.  In a similar fashion, using the bipolar RF device, the Maze lesions were created around the right pulmonary veins.  On the external surface of the heart, a cryo lesion was made connecting  the left atrial appendage lesion and the left pulmonary vein lesion.  A 45 mm Accu-Care atrial clip was placed on the left atrial appendage.  We then proceeded with a coronary artery bypass grafting.  The posterior descending coronary artery was opened and admitted a 1.5 mm probe distally.  Using a running 7-0 Prolene, distal anastomosis was performed.  Additional cold blood cardioplegia was administered down the vein graft.  While the heart was elevated, the coronary sinus and an area between the circumflex coronary artery and the right coronary artery was marked with a blue marker to facilitate placing the cryo lesion.  We then proceeded with opening the left atrium and the full left-sided Maze was completed with a box lesion with bipolar device and lesion partially bipolar and then completed with a cryo probe from the box lesion to the P3 area of the mitral valve annulus.  Prior to this cryo lesion the retrograde catheter had been pulled back.  A right superior pulmonary vein vent was positioned into the left atrium, the left atrium was closed with horizontal mattress sutures in a double layer.  Then proceeded with opening the right atrium both to complete the right-sided Maze and also as a approach to the ventricular septal defect, which had been nicely demonstrated on TEE.  The tricuspid valve appeared intact, the anterior leaflet was retracted slightly and gave good visualization of a perimembranous VSD that was approximately 1 cm in size.  A portion of the on Dacron graft was cut to appropriate size.  A 4-0 Prolene suture with a small pledget was placed through the commissure base of the anterior and septal leaflets and brought out through the patch.  In each direction several in- and out sutures were placed back through the commissure area of the tricuspid anterior leaflet and septal leaflet and the patch was pulled down into position.  Each of the ends of the suture were then run  around to secure the patch taking care not to snag the aortic valve leaflets.  With the patch secured in place, the sutures were tied.  With completion of this, the patch was checked and was securely in place.  We then proceeded with right-sided Maze lesions extending from the cut atrium superiorly and inferiorly along vena cava across the free wall of the right atrium cryo lesions were made to the 10 o'clock and 2 o'clock position of the tricuspid valve.  These were done with the crossclamp removed.  The right atrium was then closed with a running Prolene suture.  An 18-gauge needle was introduced into the left ventricular apex to further de-air the heart.  The patient required electric defibrillation and returned to a sinus rhythm in the 80s.  A partial occlusion clamp was placed on the ascending aorta and the single punch aortotomy was performed and the vein graft was anastomosed to the ascending aorta.  Atrial and ventricular pacing wires were applied. Graft marker was applied.  Heart was  allowed to fill and careful examination with the TEE showed good function of the tricuspid valve, very trace mitral regurgitation,  which had been present preoperatively. The right superior pulmonary vein then was removed.  There was no aortic insufficiency and no visible shunting across the area of the patch was identified.  With the patient's body temperature rewarmed to 37 degrees, he was then ventilated and weaned from cardiopulmonary bypass on low- dose dopamine and milrinone used.  He remained hemodynamically stable, was decannulated in usual fashion.  Protamine sulfate was administered with operative field hemostatic.  The pericardium was loosely reapproximated.  Two Blake mediastinal drains were left in the mediastinum.  The sternum was closed with #6 stainless steel wire. Fascia was closed with interrupted running 3-0 Vicryl in subcutaneous tissue, 4-0 subcuticular stitch in skin edges.   Sponge and needle counts were reported as correct at completion of procedure.  The patient did not require any blood bank blood products during the operative procedure.  Cross-clamp time was 145 minutes.  Pump time was 235 minutes.  After the induction of anesthesia, the pulmonary artery saturation was 86.  The superior vena cava saturation was 71.  After separation cardiopulmonary bypass, the PA saturation was 73, and the superior vena caval saturation was 74.  Summary of maze lesions created right and left pulmonary vein isolation with bipolar Superior and inferior connecting line of box lesion -bipolar Connecting lesion from the right pulmonary vein to the mitral annulus bipolar lesion Coronary sinus lesion with Cryoprobe Connecting lesion from the left atrial appendage to the left pulmonary veins done epicardially with cryoprobe Bipolar atrial appendage lesion, and application of atrial clip Right atrial lesions- right atrial appendage lesion with bipolar Right atrial appendage to tricuspid annulus with cryoprobe Connecting lesion from atriotomy to tricuspid valve with cryoprobe Superior and inferior vena cava lesions with the bipolar RF clamp   Sheliah Plane, MD     EG/MEDQ  D:  01/08/2013  T:  01/08/2013  Job:  [id number]

## 2013-01-08 NOTE — Progress Notes (Signed)
Switched pt back to full support due to decreased pt effort

## 2013-01-08 NOTE — Anesthesia Postprocedure Evaluation (Signed)
  Anesthesia Post-op Note  Patient: Aaron Jones  Procedure(s) Performed: Procedure(s) with comments: CORONARY ARTERY BYPASS GRAFTING (CABG) (N/A) - Times1 using endoscopically harvested saphenous vein MAZE (N/A) VENTRICULAR SEPTAL DEFECT (VSD) REPAIR (N/A) INTRAOPERATIVE TRANSESOPHAGEAL ECHOCARDIOGRAM (N/A) CLIPPING OF ATRIAL APPENDAGE (N/A)  Patient Location: SICU  Anesthesia Type:General  Level of Consciousness: awake, alert  and oriented  Airway and Oxygen Therapy: Patient Spontanous Breathing and Patient connected to nasal cannula oxygen  Post-op Pain: none  Post-op Assessment: Post-op Vital signs reviewed, Patient's Cardiovascular Status Stable, Respiratory Function Stable, Patent Airway, No signs of Nausea or vomiting, Adequate PO intake and Pain level controlled  Post-op Vital Signs: Reviewed and stable  Complications: No apparent anesthesia complications

## 2013-01-08 NOTE — Progress Notes (Signed)
POD #1 CABG, MAZE, VSD Closure  Up in chair  BP 150/67  Pulse 65  Temp(Src) 98.7 F (37.1 C) (Oral)  Resp 24  Ht 6' (1.829 m)  Wt 227 lb 8.2 oz (103.2 kg)  BMI 30.85 kg/m2  SpO2 99%   Intake/Output Summary (Last 24 hours) at 01/08/13 1732 Last data filed at 01/08/13 1700  Gross per 24 hour  Intake 3041.38 ml  Output   1543 ml  Net 1498.38 ml   CBG OK  Creatinine up to 1.4, lytes OK  HCt 35  Some accelerated junctional rhythm- on amiodarone and lopressor  Continue current care

## 2013-01-09 ENCOUNTER — Inpatient Hospital Stay (HOSPITAL_COMMUNITY): Payer: Managed Care, Other (non HMO)

## 2013-01-09 ENCOUNTER — Encounter (HOSPITAL_COMMUNITY): Payer: Self-pay | Admitting: Cardiothoracic Surgery

## 2013-01-09 LAB — CBC
HCT: 34.4 % — ABNORMAL LOW (ref 39.0–52.0)
Hemoglobin: 12.1 g/dL — ABNORMAL LOW (ref 13.0–17.0)
MCH: 31.9 pg (ref 26.0–34.0)
MCHC: 35.2 g/dL (ref 30.0–36.0)
MCV: 90.8 fL (ref 78.0–100.0)

## 2013-01-09 LAB — GLUCOSE, CAPILLARY
Glucose-Capillary: 132 mg/dL — ABNORMAL HIGH (ref 70–99)
Glucose-Capillary: 120 mg/dL — ABNORMAL HIGH (ref 70–99)
Glucose-Capillary: 117 mg/dL — ABNORMAL HIGH (ref 70–99)
Glucose-Capillary: 112 mg/dL — ABNORMAL HIGH (ref 70–99)
Glucose-Capillary: 105 mg/dL — ABNORMAL HIGH (ref 70–99)
Glucose-Capillary: 107 mg/dL — ABNORMAL HIGH (ref 70–99)
Glucose-Capillary: 138 mg/dL — ABNORMAL HIGH (ref 70–99)
Glucose-Capillary: 129 mg/dL — ABNORMAL HIGH (ref 70–99)
Glucose-Capillary: 112 mg/dL — ABNORMAL HIGH (ref 70–99)
Glucose-Capillary: 106 mg/dL — ABNORMAL HIGH (ref 70–99)
Glucose-Capillary: 94 mg/dL (ref 70–99)
Glucose-Capillary: 159 mg/dL — ABNORMAL HIGH (ref 70–99)
Glucose-Capillary: 153 mg/dL — ABNORMAL HIGH (ref 70–99)
Glucose-Capillary: 176 mg/dL — ABNORMAL HIGH (ref 70–99)
Glucose-Capillary: 162 mg/dL — ABNORMAL HIGH (ref 70–99)
Glucose-Capillary: 135 mg/dL — ABNORMAL HIGH (ref 70–99)

## 2013-01-09 LAB — BASIC METABOLIC PANEL
BUN: 24 mg/dL — ABNORMAL HIGH (ref 6–23)
Chloride: 101 mEq/L (ref 96–112)
Glucose, Bld: 175 mg/dL — ABNORMAL HIGH (ref 70–99)
Potassium: 4.4 mEq/L (ref 3.5–5.1)

## 2013-01-09 MED ORDER — ASPIRIN EC 81 MG PO TBEC
81.0000 mg | DELAYED_RELEASE_TABLET | Freq: Every day | ORAL | Status: DC
  Administered 2013-01-09 – 2013-01-12 (×4): 81 mg via ORAL
  Filled 2013-01-09 (×4): qty 1

## 2013-01-09 MED ORDER — DIPHENHYDRAMINE HCL 25 MG PO CAPS: 25.0000 mg | ORAL_CAPSULE | Freq: Every evening | ORAL | Status: DC | PRN

## 2013-01-09 MED ORDER — SODIUM CHLORIDE 0.9 % IJ SOLN: 3.0000 mL | INTRAMUSCULAR | Status: DC | PRN

## 2013-01-09 MED ORDER — AMIODARONE HCL 200 MG PO TABS: 400.0000 mg | ORAL_TABLET | Freq: Every day | ORAL | Status: DC

## 2013-01-09 MED ORDER — ACETAMINOPHEN 325 MG PO TABS: 650.0000 mg | ORAL_TABLET | Freq: Four times a day (QID) | ORAL | Status: DC | PRN

## 2013-01-09 MED ORDER — FUROSEMIDE 10 MG/ML IJ SOLN: 40.0000 mg | Freq: Two times a day (BID) | INTRAMUSCULAR | Status: DC

## 2013-01-09 MED ORDER — INSULIN DETEMIR 100 UNIT/ML ~~LOC~~ SOLN: 10.0000 [IU] | Freq: Every day | SUBCUTANEOUS | Status: DC

## 2013-01-09 MED ORDER — MOVING RIGHT ALONG BOOK
Freq: Once | Status: AC
  Administered 2013-01-09: 11:00:00
  Filled 2013-01-09: qty 1

## 2013-01-09 MED ORDER — ONDANSETRON HCL 4 MG/2ML IJ SOLN: 4.0000 mg | Freq: Four times a day (QID) | INTRAMUSCULAR | Status: DC | PRN

## 2013-01-09 MED ORDER — DOCUSATE SODIUM 100 MG PO CAPS
200.0000 mg | ORAL_CAPSULE | Freq: Every day | ORAL | Status: DC
  Administered 2013-01-09 – 2013-01-12 (×4): 200 mg via ORAL
  Filled 2013-01-09 (×4): qty 2

## 2013-01-09 MED ORDER — PANTOPRAZOLE SODIUM 40 MG PO TBEC
40.0000 mg | DELAYED_RELEASE_TABLET | Freq: Every day | ORAL | Status: DC
  Administered 2013-01-10 – 2013-01-12 (×3): 40 mg via ORAL
  Filled 2013-01-09 (×4): qty 1

## 2013-01-09 MED ORDER — METOPROLOL TARTRATE 25 MG PO TABS
25.0000 mg | ORAL_TABLET | Freq: Two times a day (BID) | ORAL | Status: DC
  Administered 2013-01-09 – 2013-01-12 (×7): 25 mg via ORAL
  Filled 2013-01-09 (×8): qty 1

## 2013-01-09 MED ORDER — ONDANSETRON HCL 4 MG PO TABS: 4.0000 mg | ORAL_TABLET | Freq: Four times a day (QID) | ORAL | Status: DC | PRN

## 2013-01-09 MED ORDER — AMIODARONE HCL 200 MG PO TABS
400.0000 mg | ORAL_TABLET | Freq: Two times a day (BID) | ORAL | Status: DC
  Administered 2013-01-09 (×2): 400 mg via ORAL
  Filled 2013-01-09 (×5): qty 2

## 2013-01-09 MED ORDER — POTASSIUM CHLORIDE CRYS ER 20 MEQ PO TBCR: 20.0000 meq | EXTENDED_RELEASE_TABLET | Freq: Every day | ORAL | Status: DC

## 2013-01-09 MED ORDER — DILTIAZEM HCL ER COATED BEADS 360 MG PO CP24
360.0000 mg | ORAL_CAPSULE | Freq: Every day | ORAL | Status: DC
  Administered 2013-01-10: 360 mg via ORAL
  Filled 2013-01-09 (×2): qty 1

## 2013-01-09 MED ORDER — TRAMADOL HCL 50 MG PO TABS
50.0000 mg | ORAL_TABLET | ORAL | Status: DC | PRN
  Administered 2013-01-09: 50 mg via ORAL
  Administered 2013-01-11: 100 mg via ORAL
  Filled 2013-01-09: qty 2
  Filled 2013-01-09: qty 1

## 2013-01-09 MED ORDER — METOPROLOL TARTRATE 50 MG PO TABS: 75.0000 mg | ORAL_TABLET | Freq: Two times a day (BID) | ORAL | Status: DC

## 2013-01-09 MED ORDER — POTASSIUM CHLORIDE CRYS ER 20 MEQ PO TBCR
20.0000 meq | EXTENDED_RELEASE_TABLET | Freq: Every day | ORAL | Status: DC
  Administered 2013-01-09 – 2013-01-12 (×4): 20 meq via ORAL
  Filled 2013-01-09 (×4): qty 1

## 2013-01-09 MED ORDER — SODIUM CHLORIDE 0.9 % IV SOLN: 250.0000 mL | INTRAVENOUS | Status: DC | PRN

## 2013-01-09 MED ORDER — INSULIN ASPART 100 UNIT/ML ~~LOC~~ SOLN
0.0000 [IU] | Freq: Three times a day (TID) | SUBCUTANEOUS | Status: DC
  Administered 2013-01-09: 2 [IU] via SUBCUTANEOUS
  Administered 2013-01-09 (×2): 4 [IU] via SUBCUTANEOUS
  Administered 2013-01-10 – 2013-01-11 (×7): 2 [IU] via SUBCUTANEOUS

## 2013-01-09 MED ORDER — APIXABAN 2.5 MG PO TABS
2.5000 mg | ORAL_TABLET | Freq: Two times a day (BID) | ORAL | Status: DC
  Administered 2013-01-10 – 2013-01-11 (×3): 2.5 mg via ORAL
  Filled 2013-01-09 (×6): qty 1

## 2013-01-09 MED ORDER — FUROSEMIDE 40 MG PO TABS
40.0000 mg | ORAL_TABLET | Freq: Every day | ORAL | Status: DC
  Administered 2013-01-09 – 2013-01-10 (×2): 40 mg via ORAL
  Filled 2013-01-09 (×2): qty 1

## 2013-01-09 MED ORDER — BISACODYL 5 MG PO TBEC
10.0000 mg | DELAYED_RELEASE_TABLET | Freq: Every day | ORAL | Status: DC | PRN
  Administered 2013-01-11: 10 mg via ORAL
  Filled 2013-01-09 (×2): qty 1

## 2013-01-09 MED ORDER — BISACODYL 10 MG RE SUPP: 10.0000 mg | Freq: Every day | RECTAL | Status: DC | PRN

## 2013-01-09 MED ORDER — SODIUM CHLORIDE 0.9 % IJ SOLN
3.0000 mL | Freq: Two times a day (BID) | INTRAMUSCULAR | Status: DC
  Administered 2013-01-09 – 2013-01-10 (×4): 3 mL via INTRAVENOUS
  Administered 2013-01-11: 18:00:00 via INTRAVENOUS
  Administered 2013-01-11 – 2013-01-12 (×2): 3 mL via INTRAVENOUS

## 2013-01-09 MED ORDER — OXYCODONE HCL 5 MG PO TABS
5.0000 mg | ORAL_TABLET | ORAL | Status: DC | PRN
  Administered 2013-01-09 – 2013-01-11 (×5): 10 mg via ORAL
  Filled 2013-01-09 (×5): qty 2

## 2013-01-09 MED ORDER — AMLODIPINE BESYLATE 5 MG PO TABS
5.0000 mg | ORAL_TABLET | Freq: Every day | ORAL | Status: DC
  Filled 2013-01-09: qty 1

## 2013-01-09 NOTE — Plan of Care (Signed)
Problem: Phase III Progression Outcomes Goal: Time patient transferred to PCTU/Telemetry POD Outcome: Completed/Met Date Met:  01/09/13 Transferred patient to room 2016 via wheelchair with propak.  Tolerated transfer well.  Vitals stable.  Placed on monitor on 2000 in room 2016. Goal: Discharge plan remains appropriate-arrangements made Outcome: Completed/Met Date Met:  01/09/13 Home with wife upon discharge.

## 2013-01-09 NOTE — Progress Notes (Addendum)
      [address]       [place] [address]             [phone]      2 Days Post-Op Procedure(s) (LRB): CORONARY ARTERY BYPASS GRAFTING (CABG) (N/A) MAZE (N/A) VENTRICULAR SEPTAL DEFECT (VSD) REPAIR (N/A) INTRAOPERATIVE TRANSESOPHAGEAL ECHOCARDIOGRAM (N/A) CLIPPING OF ATRIAL APPENDAGE (N/A)  Subjective:  Patient states doing well.  He continues to have some minor incisional pain.  He ambulated around the unit yesterday.  No BM  Objective: Vital signs in last 24 hours: Temp:  [98.5 F (36.9 C)-99.2 F (37.3 C)] 99.2 F (37.3 C) (08/07 0354) Pulse Rate:  [58-132] 66 (08/07 0700) Cardiac Rhythm:  [-] Junctional rhythm (08/07 0600) Resp:  [12-27] 22 (08/07 0700) BP: (109-171)/(58-95) 150/95 mmHg (08/07 0700) SpO2:  [94 %-100 %] 99 % (08/07 0700) Arterial Line BP: (183-214)/(58-68) 214/68 mmHg (08/06 0900) Weight:  [225 lb 15.5 oz (102.5 kg)] 225 lb 15.5 oz (102.5 kg) (08/07 0400)  Hemodynamic parameters for last 24 hours: PAP: (40-53)/(21-33) 45/22 mmHg  Intake/Output from previous day: 08/06 0701 - 08/07 0700 In: 1445.2 [P.O.:400; I.V.:945.2; IV Piggyback:100] Out: 800 [Urine:720; Chest Tube:80]  General appearance: alert, cooperative and no distress Heart: regular rate and rhythm Lungs: clear to auscultation bilaterally Abdomen: soft, non-tender; bowel sounds normal; no masses,  no organomegaly Extremities: edema 1+ pitting Wound: clean and dry  Lab Results:  Recent Labs  01/08/13 1700 01/08/13 1706 01/09/13 0400  WBC 17.7*  --  14.9*  HGB 12.2* 11.9* 12.1*  HCT 34.5* 35.0* 34.4*  PLT 106*  --  99*   BMET:  Recent Labs  01/08/13 0330  01/08/13 1706 01/09/13 0400  NA 138  --  135 134*  K 4.2  --  4.5 4.4  CL 108  --  103 101  CO2 20  --   --  22  GLUCOSE 127*  --  180* 175*  BUN 14  --  17 24*  CREATININE 0.87  < > 1.40* 1.42*  CALCIUM 7.5*  --   --  8.0*  < > = values in this interval not displayed.  PT/INR:  Recent Labs  01/07/13 1600  LABPROT 17.8*  INR 1.51*   ABG    Component Value Date/Time   PHART 7.341* 01/08/2013 0319   HCO3 21.1 01/08/2013 0319   TCO2 20 01/08/2013 1706   ACIDBASEDEF 4.0* 01/08/2013 0319   O2SAT 97.0 01/08/2013 0319   CBG (last 3)   Recent Labs  01/08/13 1939 01/08/13 2306 01/09/13 0352  GLUCAP 153* 137* 159*    Assessment/Plan: S/P Procedure(s) (LRB): CORONARY ARTERY BYPASS GRAFTING (CABG) (N/A) MAZE (N/A) VENTRICULAR SEPTAL DEFECT (VSD) REPAIR (N/A) INTRAOPERATIVE TRANSESOPHAGEAL ECHOCARDIOGRAM (N/A) CLIPPING OF ATRIAL APPENDAGE (N/A)  1. CV- Sinus Brady, Hypertensive- on Lopressor, IV Amiodarone, will add Norvasc  2. Pulm- off oxygen, continue IS 3. Renal- creatinine trending up, volume overloaded- will repeat IV Lasix today, monitor Creatinine 4. Expected Blood Loss Anemia- stable 5. DM- CBGs controlled, continue Levemir, will hold home Metformin until creatinine stabilizes 6. Dispo- patient stable, remains on IV Amiodarone can possibly change to oral today, transfer to 2000    LOS: 2 days    BARRETT, ERIN 01/09/2013  Cr stable 1.4 To stepdown today I have seen and examined Aaron Jones and agree with the above assessment  and plan.  Delight Ovens MD Beeper [phone] Office [phone] 01/09/2013 7:58 AM

## 2013-01-09 NOTE — Progress Notes (Signed)
Received to room 2016. Assisted to bedside chair. Denies chest pain or shortness of breath. VSS. Call bell near. Will monitor.Aaron Jones

## 2013-01-09 NOTE — Care Management Note (Signed)
    Page 1 of 1   01/09/2013     1:29:00 PM   CARE MANAGEMENT NOTE 01/09/2013  Patient:  Aaron Jones,Aaron Jones   Account Number:  [id number]  Date Initiated:  01/07/2013  Documentation initiated by:  Avie Arenas  Subjective/Objective Assessment:   Post op CABG     Action/Plan:   Anticipated DC Date:  01/13/2013   Anticipated DC Plan:  HOME W HOME HEALTH SERVICES      DC Planning Services  CM consult      Choice offered to / List presented to:             Status of service:  In process, will continue to follow Medicare Important Message given?   (If response is "NO", the following Medicare IM given date fields will be blank) Date Medicare IM given:   Date Additional Medicare IM given:    Discharge Disposition:    Per UR Regulation:  Reviewed for med. necessity/level of care/duration of stay  If discussed at Long Length of Stay Meetings, dates discussed:    Comments:  [employer], [place] [phone]   [phone]                 Farris,Pam Relative [phone]   01-09-13 9am Avie Arenas, RNBSN [phone] Talked with patient - states independent prior to admission - lives with wife.  Wife and aunt plan to assist on discharge home.  CM will continue to follow.

## 2013-01-10 ENCOUNTER — Inpatient Hospital Stay (HOSPITAL_COMMUNITY): Payer: Managed Care, Other (non HMO)

## 2013-01-10 ENCOUNTER — Encounter (HOSPITAL_COMMUNITY): Payer: Self-pay | Admitting: Physician Assistant

## 2013-01-10 DIAGNOSIS — I5031 Acute diastolic (congestive) heart failure: Secondary | ICD-10-CM

## 2013-01-10 DIAGNOSIS — I471 Supraventricular tachycardia: Secondary | ICD-10-CM

## 2013-01-10 LAB — GLUCOSE, CAPILLARY
Glucose-Capillary: 167 mg/dL — ABNORMAL HIGH (ref 70–99)
Glucose-Capillary: 121 mg/dL — ABNORMAL HIGH (ref 70–99)
Glucose-Capillary: 131 mg/dL — ABNORMAL HIGH (ref 70–99)
Glucose-Capillary: 160 mg/dL — ABNORMAL HIGH (ref 70–99)
Glucose-Capillary: 144 mg/dL — ABNORMAL HIGH (ref 70–99)
Glucose-Capillary: 153 mg/dL — ABNORMAL HIGH (ref 70–99)

## 2013-01-10 LAB — BASIC METABOLIC PANEL WITH GFR
BUN: 29 mg/dL — ABNORMAL HIGH (ref 6–23)
CO2: 24 meq/L (ref 19–32)
Calcium: 8.7 mg/dL (ref 8.4–10.5)
Chloride: 100 meq/L (ref 96–112)
Creatinine, Ser: 1.02 mg/dL (ref 0.50–1.35)
GFR calc Af Amer: 87 mL/min — ABNORMAL LOW
GFR calc non Af Amer: 75 mL/min — ABNORMAL LOW
Glucose, Bld: 133 mg/dL — ABNORMAL HIGH (ref 70–99)
Potassium: 4.6 meq/L (ref 3.5–5.1)
Sodium: 134 meq/L — ABNORMAL LOW (ref 135–145)

## 2013-01-10 LAB — CBC
HCT: 36.5 % — ABNORMAL LOW (ref 39.0–52.0)
Hemoglobin: 12.3 g/dL — ABNORMAL LOW (ref 13.0–17.0)
MCH: 30.9 pg (ref 26.0–34.0)
MCHC: 33.7 g/dL (ref 30.0–36.0)
MCV: 91.7 fL (ref 78.0–100.0)
Platelets: 117 10*3/uL — ABNORMAL LOW (ref 150–400)
RBC: 3.98 MIL/uL — ABNORMAL LOW (ref 4.22–5.81)
RDW: 12.6 % (ref 11.5–15.5)
WBC: 12.9 10*3/uL — ABNORMAL HIGH (ref 4.0–10.5)

## 2013-01-10 MED ORDER — METFORMIN HCL ER 500 MG PO TB24
1000.0000 mg | ORAL_TABLET | Freq: Two times a day (BID) | ORAL | Status: DC
  Administered 2013-01-10 – 2013-01-12 (×4): 1000 mg via ORAL
  Filled 2013-01-10 (×6): qty 2

## 2013-01-10 MED ORDER — FUROSEMIDE 10 MG/ML IJ SOLN
40.0000 mg | Freq: Two times a day (BID) | INTRAMUSCULAR | Status: DC
  Administered 2013-01-10 – 2013-01-12 (×4): 40 mg via INTRAVENOUS
  Filled 2013-01-10 (×7): qty 4

## 2013-01-10 MED ORDER — AMIODARONE HCL 200 MG PO TABS
200.0000 mg | ORAL_TABLET | Freq: Two times a day (BID) | ORAL | Status: DC
  Administered 2013-01-10 – 2013-01-11 (×4): 200 mg via ORAL
  Filled 2013-01-10 (×6): qty 1

## 2013-01-10 NOTE — Progress Notes (Signed)
Patient ambulated 550 ft in hallway using walker. Returned to bedside chair in room w/out incidence.Call bell and family near. Aaron Jones

## 2013-01-10 NOTE — Consult Note (Signed)
Referring Physician:  Primary Physician: Elmo Putt, MD Primary Cardiologist: [facility] Reason for Consultation: Bradycardia  HPI: [name] is a 67 year old male with a history of atrial fibrillation, CAD, VSD and CHF. He was admitted 8/5 for MAZE, VSD repair and CABG x 1. After surgery, he was on Dopamine, milrinone and neosynephrine, plus IV amiodarone. While on these meds, he had some WCT, possible sinus tach with aberrancy vs junctional tachycardia. He was maintaining a pressure, so the pressors were weaned, starting with dopamine. He was extubated on 8/6 early am. He continued to have junctional tachycardia. He was on Lopressor and Cardizem plus oral amiodarone once he started taking PO meds. He had some bradycardia but stabilized and was transferred to 2016 on 8/7. Current medications include metoprolol 25 bid and Cardizem 360. Amiodarone stopped today due to episodes of what was felt to be junctional bradycardia. Cardiology was asked to see him.   [name] feels that he is recovering steadily. His breathing is improving as he is being diuresed. He is ambulating in the halls. He has never had palpitations. He has no history of presyncope or syncope. He is not aware of any arrhythmia and is not aware of his heart ever going slow.  Review of Systems:    Cardiac Review of Systems: {Y] = yes [ ]  = no  Chest Pain [    ]  Resting SOB [   ] Exertional SOB  [ y ]  Orthopnea [ y ]   Pedal Edema [   ]    Palpitations [  ] Syncope  [  ]   Presyncope [   ]  General Review of Systems: [Y] = yes [  ]=no Constitional: recent weight change [ y ]; anorexia [ y ]; fatigue [  ]; nausea [  ]; night sweats [  ]; fever [  ]; or chills [  ];                                                                                                                                          Dental: poor dentition[ y ]; Last Dentist visit: 12/23/2012  Eye : blurred vision [  ]; diplopia [   ]; vision changes [  ];  Amaurosis  fugax[  ]; Resp: cough [  ];  wheezing[  ];  hemoptysis[  ]; shortness of breath[  ]; paroxysmal nocturnal dyspnea[  ]; dyspnea on exertion[ y ]; or orthopnea[  ];  GI:  gallstones[  ], vomiting[  ];  dysphagia[  ]; melena[  ];  hematochezia [  ]; heartburn[  ];   Hx of  Colonoscopy[  ];  appetite is poor since the surgery GU: kidney stones [  ]; hematuria[  ];   dysuria [  ];  nocturia[  ];  history of     obstruction [  ];  Skin: rash, swelling[  ];, hair loss[  ];  peripheral edema[ y ];  or itching[  ]; Musculosketetal: myalgias[  ];  joint swelling[  ];  joint erythema[  ];  joint pain[  ];  back pain[  ];  Heme/Lymph: bruising[  ];  bleeding[  ];  anemia[  ];  Neuro: TIA[  ];  headaches[  ];  stroke[  ];  vertigo[  ];  seizures[  ];   paresthesias[  ];  difficulty walking[  ];  Psych:depression[  ]; anxiety[  ];  Endocrine: diabetes[  ];  thyroid dysfunction[  ];  Immunizations: Flu [  ]; Pneumococcal[  ];  Other:  Past Medical History  Diagnosis Date  . Diabetes mellitus without complication   . GERD (gastroesophageal reflux disease)   . Hyperlipidemia   . VSD (ventricular septal defect)   . Tachycardia, paroxysmal May 2013    Possible SVT  . Abnormal heart rhythms 11/2012    A-Fib- sees Dr Lady GaryGavin Potters   . Hypertension     PCP- Alena Bills, phone; [phone]  . Sleep apnea     [facility]- in process of being evaluated now, states 12 yrs. ago was on CPAP but after having his deviated septum repaired, he had put the CPAP in storage. Pt. will get a new machine soon.   Marland Kitchen Heart murmur   . Chronic kidney disease     stones passed spontaneously   Past Surgical History  Procedure Laterality Date  . Nasal septum surgery  2004  . Tee without cardioversion N/A 11/21/2012    Procedure: TRANSESOPHAGEAL ECHOCARDIOGRAM (TEE);  Surgeon: Dolores Patty, MD;  Location: [facility] ENDOSCOPY;  Service: Cardiovascular;  Laterality: N/A;  . Cardiac catheterization  > 5 yr ago     Done at [employer], reportedly clean  . Cardiac catheterization  11/2012  . Multiple extractions with alveoloplasty N/A 12/11/2012    Procedure: Extraction of tooth #'s 2,3,4,5,14,15,17,21,22,23,24,25,26,27,32 wioth alveoloplasty and bialteral fibrous tuberosity reductions.;  Surgeon: Charlynne Pander, DDS;  Location: [facility] ORS;  Service: Oral Surgery;  Laterality: N/A;  . Eye surgery  1990's    L eye  . Coronary artery bypass graft N/A 01/07/2013    Procedure: CORONARY ARTERY BYPASS GRAFTING (CABG);  Surgeon: Delight Ovens, MD;  Location: [facility] OR;  Service: Open Heart Surgery;  Laterality: N/A;  Times1 using endoscopically harvested saphenous vein graft to the PDA  . Maze N/A 01/07/2013    Procedure: MAZE;  Surgeon: Delight Ovens, MD;  Location[facility] OR;  Service: Open Heart Surgery;  Laterality: N/A;  . Vsd repair N/A 01/07/2013    Procedure: VENTRICULAR SEPTAL DEFECT (VSD) REPAIR;  Surgeon: Delight Ovens, MD;  Location: [facility] OR;  Service: Open Heart Surgery;  Laterality: N/A;  . Intraoperative transesophageal echocardiogram N/A 01/07/2013    Procedure: INTRAOPERATIVE TRANSESOPHAGEAL ECHOCARDIOGRAM;  Surgeon: Delight Ovens, MD;  Location: [facility] OR;  Service: Open Heart Surgery;  Laterality: N/A;  . Clipping of atrial appendage N/A 01/07/2013    Procedure: CLIPPING OF ATRIAL APPENDAGE;  Surgeon: Delight Ovens, MD;  Location: [facility] OR;  Service: Open Heart Surgery;  Laterality: N/A;     Current Medications: . apixaban  2.5 mg Oral BID  . aspirin EC  81 mg Oral Daily  . atorvastatin  20 mg Oral QHS  . diltiazem  360 mg Oral QAC breakfast  . docusate sodium  200 mg Oral Daily  . furosemide  40 mg Oral Daily  .  insulin aspart  0-24 Units Subcutaneous TID AC & HS  . insulin detemir  10 Units Subcutaneous Daily  . metoprolol  25 mg Oral BID  . pantoprazole  40 mg Oral QAC breakfast  . potassium chloride  20 mEq Oral Daily  . sodium chloride  3 mL Intravenous Q12H   Infusions:    Prescriptions  prior to admission  Medication Sig Dispense Refill  . apixaban (ELIQUIS) 5 MG TABS tablet Take 5 mg by mouth 2 (two) times daily.      Marland Kitchen aspirin 81 MG tablet Take 81 mg by mouth daily with breakfast.      . atorvastatin (LIPITOR) 20 MG tablet Take 20 mg by mouth at bedtime.      Marland Kitchen diltiazem (CARDIZEM CD) 360 MG 24 hr capsule Take 360 mg by mouth daily before breakfast.       . furosemide (LASIX) 40 MG tablet Take 40 mg by mouth daily.       . insulin glargine (LANTUS) 100 units/mL SOLN Inject 18 Units into the skin daily at 10 pm.      . metFORMIN (GLUCOPHAGE-XR) 500 MG 24 hr tablet Take 1,000 mg by mouth 2 (two) times daily.      . metoprolol (LOPRESSOR) 50 MG tablet Take 75 mg by mouth 2 (two) times daily.      Marland Kitchen omeprazole (PRILOSEC) 40 MG capsule Take 40 mg by mouth daily before breakfast.       . potassium chloride SA (K-DUR,KLOR-CON) 20 MEQ tablet Take 20 mEq by mouth daily.      Marland Kitchen acetaminophen (TYLENOL) 500 MG tablet Take 500 mg by mouth every 6 (six) hours as needed for pain.      . Glucose Blood (BLOOD GLUCOSE TEST STRIPS) STRP 1 strip by In Vitro route 2 (two) times daily.  100 each  0     Allergies  Allergen Reactions  . Biaxin (Clarithromycin) Nausea Only    History   Social History  . Marital Status: Married    Spouse Name: N/A    Number of Children: N/A  . 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 Smoking status: Never Smoker   . Smokeless tobacco: Never Used  . Alcohol Use: No  . Drug Use: No  . Sexually Active: No   Other Topics Concern  . Not on file   Social History Narrative   Still works full time. Works around the yard. Lives with wife. Has 2 sisters, neither with cardiac issues.   Family Status  Relation Status Death Age  . Mother Deceased 43    PNA, DM  . Father Deceased 26    Infection after hip surgery    PHYSICAL EXAM: Filed Vitals:   01/10/13 0902  BP:  170/74  Pulse:   Temp:   Resp:      Intake/Output Summary (Last 24 hours) at 01/10/13 1144 Last data filed at 01/10/13 0730  Gross per 24 hour  Intake    240 ml  Output    850 ml  Net   -610 ml    General:  Well appearing. No respiratory difficulty at rest HEENT: normal; status post multiple tooth extractions Neck: supple. no JVD. Carotids 2+ bilat; no bruits. No lymphadenopathy or thryomegaly appreciated. Cor: PMI nondisplaced. Regular rate & rhythm. No rubs, 2/6 murmur LSB Lungs: Bibasilar coarse rales Abdomen: soft, nontender, nondistended.  No hepatosplenomegaly. No bruits or masses. Decreased bowel sounds. Extremities: no cyanosis, clubbing, rash, 1-2+ edema Neuro: alert & oriented x 3, cranial nerves grossly intact. moves all 4 extremities w/o difficulty. Affect pleasant.   ECHO: Results for orders placed during the hospital encounter of 01/07/13 (from the past 24 hour(s))  GLUCOSE, CAPILLARY     Status: Abnormal   Collection Time    01/09/13 12:12 PM      Result Value Range   Glucose-Capillary 176 (*) 70 - 99 mg/dL   Comment 1 Documented in Chart     Comment 2 Notify RN    GLUCOSE, CAPILLARY     Status: Abnormal   Collection Time    01/09/13  4:39 PM      Result Value Range   Glucose-Capillary 162 (*) 70 - 99 mg/dL   Comment 1 Notify RN     Comment 2 Documented in Chart    GLUCOSE, CAPILLARY     Status: Abnormal   Collection Time    01/09/13  8:53 PM      Result Value Range   Glucose-Capillary 135 (*) 70 - 99 mg/dL  GLUCOSE, CAPILLARY     Status: Abnormal   Collection Time    01/09/13 11:58 PM      Result Value Range   Glucose-Capillary 121 (*) 70 - 99 mg/dL  GLUCOSE, CAPILLARY     Status: Abnormal   Collection Time    01/10/13  5:45 AM      Result Value Range   Glucose-Capillary 131 (*) 70 - 99 mg/dL  CBC     Status: Abnormal   Collection Time    01/10/13  6:35 AM      Result Value Range   WBC 12.9 (*) 4.0 - 10.5 K/uL   RBC 3.98 (*) 4.22 - 5.81  MIL/uL   Hemoglobin 12.3 (*) 13.0 - 17.0 g/dL   HCT 98.1 (*) 19.1 - 47.8 %   MCV 91.7  78.0 - 100.0 fL   MCH 30.9  26.0 - 34.0 pg   MCHC 33.7  30.0 - 36.0 g/dL   RDW 29.5  62.1 - 30.8 %   Platelets 117 (*) 150 - 400 K/uL  BASIC METABOLIC PANEL     Status: Abnormal   Collection Time    01/10/13  6:35 AM      Result Value Range   Sodium 134 (*) 135 - 145 mEq/L   Potassium 4.6  3.5 - 5.1 mEq/L   Chloride 100  96 - 112 mEq/L   CO2 24  19 - 32 mEq/L   Glucose, Bld 133 (*) 70 - 99 mg/dL   BUN 29 (*) 6 - 23 mg/dL   Creatinine, Ser 6.57  0.50 - 1.35 mg/dL   Calcium 8.7  8.4 - 84.6 mg/dL   GFR calc non Af Amer 75 (*) >90 mL/min   GFR calc Af Amer 87 (*) >90 mL/min  GLUCOSE, CAPILLARY     Status: Abnormal   Collection Time    01/10/13  9:52 AM      Result Value Range   Glucose-Capillary 167 (*) 70 - 99 mg/dL   Comment 1 Documented in Chart     Comment 2 Notify RN    GLUCOSE, CAPILLARY     Status: Abnormal   Collection Time    01/10/13 11:28 AM      Result Value Range   Glucose-Capillary 160 (*) 70 - 99 mg/dL   Comment 1 Notify RN  Comment 2 Documented in Chart     Radiology:  Dg Chest 2 View 01/10/2013   *RADIOLOGY REPORT*  Clinical Data: Atrial fibrillation; postoperative evaluation; atelectasis  CHEST - 2 VIEW  Comparison: September 09, 2012  Findings: Central catheter has been removed.  No appreciable pneumothorax.  There is patchy atelectasis in both lung bases with some mild consolidation in the posterior left base.  There is a degree of underlying emphysema.  Heart is enlarged with normal pulmonary vascularity.  Pacemaker wires remain attached to the right heart.  The patient is status post coronary artery bypass grafting. Clamp in the left atrial region appears stable.  IMPRESSION: No pneumothorax.  Patchy consolidation posterior left base.  Patchy bibasilar atelectasis present.  Heart mildly enlarged but stable.   Original Report Authenticated By: Bretta Bang, M.D.   Dg Chest  Port 1 View 01/09/2013   *RADIOLOGY REPORT*  Clinical Data: Postop cardiac surgery  PORTABLE CHEST - 1 VIEW  Comparison: 01/08/2013; 01/07/2013; 01/03/2013  Findings:  Grossly unchanged enlarged cardiac silhouette and mediastinal contours post median sternotomy and CABG.  Interval removal of PA catheter with remaining vascular sheath tip projecting over the superior SVC.  Slightly decreased lung volumes with worsening bibasilar opacities, left greater than right.  Trace bilateral effusions are not excluded.  Grossly unchanged venous congestion without frank evidence of edema.  Unchanged bones.  IMPRESSION: 1.  Decreased lung volumes with worsening bibasilar opacities favored to represent atelectasis. 2.  Persistent findings of cardiomegaly and pulmonary venous congestion without frank evidence of edema.   Original Report Authenticated By: Tacey Ruiz, MD    ASSESSMENT: [name] is a 68 year old male with a history of atrial fibrillation, CAD, VSD and CHF. He was admitted 8/5 for MAZE, VSD repair and CABG x 1. He has been improving steadily but has had some arrhythmia and cardiology was asked to evaluate him 1. Arrhythmia - likely atrial tach or atypical flutter 2.  Atrial fibrillation with rapid ventricular response - status post Maze procedure, on Eliquis - now in SR 3. Ventricular septal defect (VSD), membranous - status post surgical repair postop day #3 4. Diastolic HF  PLAN/DISCUSSION: 1. Arrhythmia - he has episodic tachycardia that is wide complex and is asymptomatic with this. No 12-lead of the tachycardia is available. The rate is ~110 but no obvious flutter waves are noted. M.D. to review strips and advise. He is currently on metoprolol at a dose lower than his home dose, Cardizem at his home dose and amiodarone which is new. M.D. advise medication changes and if he should be discharged on amiodarone which is currently not planned.  Theodore Demark, PA-C 01/10/2013 11:44 AM Beeper  [phone]  Patient seen and examined with Theodore Demark, PA-C. We discussed all aspects of the encounter. I agree with the assessment and plan as stated above. Telemetry reviewed personally at length.   [name] is well known to me from previous admit. He is now s/p CABG, VSD repair and Maze. He is mostly remaining NSR but on tele had 2 brief episodes of regular tachycardia with rate near 110. I suspect this is atrial tach or atypical flutter. He has also had some bradycardia. Weight still up about 12 pounds with fluid overload.  I would recommend resuming amiodarone at 200 bid to continue for 4 weeks post-op. Will stop cardizem. Would diurese with lasix 40 IV bid for at least 2-3 doses. Continue apixaban. Follow on tele.   Truman Hayward 2:47 PM

## 2013-01-10 NOTE — Progress Notes (Addendum)
      [address]       [place] [address]             [phone]        3 Days Post-Op Procedure(s) (LRB): CORONARY ARTERY BYPASS GRAFTING (CABG) (N/A) MAZE (N/A) VENTRICULAR SEPTAL DEFECT (VSD) REPAIR (N/A) INTRAOPERATIVE TRANSESOPHAGEAL ECHOCARDIOGRAM (N/A) CLIPPING OF ATRIAL APPENDAGE (N/A)  Subjective: Patient with complaints of not sleeping.  Objective: Vital signs in last 24 hours: Temp:  [97.4 F (36.3 C)-98.7 F (37.1 C)] 98.7 F (37.1 C) (08/08 0353) Pulse Rate:  [62-65] 65 (08/08 0353) Cardiac Rhythm:  [-] Bundle branch block;Normal sinus rhythm (08/08 0845) Resp:  [18-27] 20 (08/08 0353) BP: (136-170)/(63-78) 170/74 mmHg (08/08 0902) SpO2:  [93 %-100 %] 93 % (08/08 0353) Weight:  [104.2 kg (229 lb 11.5 oz)] 104.2 kg (229 lb 11.5 oz) (08/08 0353)  Pre op weight  98 kg Current Weight  01/10/13 104.2 kg (229 lb 11.5 oz)      Intake/Output from previous day: 08/07 0701 - 08/08 0700 In: 186.8 [P.O.:120; I.V.:66.8] Out: 580 [Urine:580]   Physical Exam:  Cardiovascular: RRR Pulmonary: Clear to auscultation bilaterally; no rales, wheezes, or rhonchi. Abdomen: Soft, non tender, bowel sounds present. Extremities: Bilateral lower extremity edema. Wounds: Clean and dry.  No erythema or signs of infection.  Lab Results: CBC:  Recent Labs  01/09/13 0400 01/10/13 0635  WBC 14.9* 12.9*  HGB 12.1* 12.3*  HCT 34.4* 36.5*  PLT 99* 117*   BMET:   Recent Labs  01/09/13 0400 01/10/13 0635  NA 134* 134*  K 4.4 4.6  CL 101 100  CO2 22 24  GLUCOSE 175* 133*  BUN 24* 29*  CREATININE 1.42* 1.02  CALCIUM 8.0* 8.7    PT/INR:  Lab Results  Component Value Date   INR 1.51* 01/07/2013   INR 1.05 01/03/2013   INR 1.10 11/18/2012   ABG:  INR: Will add last result for INR, ABG once components are confirmed Will add last 4 CBG results once components are confirmed  Assessment/Plan:  1. CV - Previous accelerated junctional and  tachycardia. HR mostly in the 60's (SR) of late. On Amiodarone 400 bid, Lopressor 25 bid, Eliquis 2.5 daily, and Cardizem 360 daily. Will stop Amiodarone. Monitor HR as may need to adjust Lopressor. Cardiology to evaluate. 2.  Pulmonary - Encourage incentive spirometer. CXR being taken-will check results. CXR this am shows bibasilar atelectasis, no pneumothorax, and cardiomegaly. 3. Volume Overload - On Lasix 40 daily. 4.  Acute blood loss anemia - Last H and H stable at 12.3 and 36.5 5.Thrombocytopenia-Platelets up to 117,000. On Ecasa 81. 6.Creatinine down to normal at 1.02 7.DM-CBGs 176/162/135. On Insulin only. Metformin not restarted yet secondary to elevated creatinine. Will restart in am. 8.Remove EPW in am 9.Benadryl PRN for sleep  ZIMMERMAN,DONIELLE MPA-C 01/10/2013,11:44 AM  I have seen and examined Aaron Jones and agree with the above assessment  and plan.  Delight Ovens MD Beeper [phone] Office [phone] 01/10/2013 6:22 PM

## 2013-01-10 NOTE — Progress Notes (Signed)
CARDIAC REHAB PHASE I   PRE:  Rate/Rhythm: 70   BP:  Sitting: 150/80      SaO2: 96% RA  MODE:  Ambulation: 350 ft   POST:  Rate/Rhythm: 70   BP:  Sitting: 170/74     SaO2: 94% RA  7:55am-8:31am Patient tolerated the walk very well.  I was pleasantly surprised on his gait and pace. He talked to me the entire walk and seemed at ease.  He states he feels very good. I educated him on the importance of using his incentive spirometer because he states he has not been using it.  I tried to do further education on restrictions at home but the patient was falling asleep.   Lindaann Slough [place], [place] 01/10/2013 8:28 AM

## 2013-01-10 NOTE — Progress Notes (Signed)
Patient ambulated 300 ft in hallway using walker. Returned to room w/out incidence. Call bell near. Will monitor.Aaron Jones

## 2013-01-10 NOTE — Discharge Summary (Signed)
Physician Discharge Summary       [address]       [name] [address]             [phone]    Patient ID: Aaron Jones MRN: [id number] DOB/AGE: 1945-08-06 67 y.o.  Admit date: 01/07/2013 Discharge date: 01/12/2013  Admission Diagnoses: 1.Ventricular septal defect (VSD), peri membranous 2.Atrial fibrillation with rapid ventricular response 3.CAD 4.History of DM 5.History of hyperlipidemia 6.History of hypertension 7.History of OSA 8.History of GERD  Discharge Diagnoses:  1.Ventricular septal defect (VSD), peri membranous 2.Atrial fibrillation with rapid ventricular response 3.CAD 4.History of DM 5.History of hyperlipidemia 6.History of hypertension 7.History of OSA 8.History of GERD 9.Mild thrombocytopenia 10.Mild ABL anemia   Procedure (s):  1. Closure of perimembranous ventricular septal defect with Dacron  patch.  2. Coronary artery bypass grafting x1 with reverse saphenous vein  graft to the posterior descending.  3. Right and left Maze procedure with bipolar RF and cryo.  4. Placement of 45 mm Accu-Cure clip, left atrial clip.  5. Right endoscopic vein harvesting by Dr. Tyrone Sage on 01/08/2013.  History of Presenting Illness: This is a 67 y.o. male with no history of CAD History of diabetes, hypertension, hypercholesterolemia. History of tachycardia in 2013, possible SVT started Cardizem. Follows with cardiologist, Dr. Lady Gary in [place]. In June weeks he noticed he would become sob with climbing stairs and with doing yard work. Symptoms improved with rest. He has noticed increased ankle swelling and onset of PND. Symptoms progressed he developed a tightening sensation around his chest and abdomen along with some muscle cramping In adductors with increased activity. The day of admission the tightening sensation in his chest and abdomen progressed to a heavy pressure that he rated a 6/10. He went to urgent care and then was sent to [facility] and [facility] and admitted on the hospitalist service .Patient reports that his pain resolved as soon as he received cardizem. He was found to be in rapid AF. Further evaluation included cath, TEE and dental evaluation. He has had poor dentition taken care of.  Potential complications, risks, and benefits of heart surgery were discussed with the patient and he agreed to proceed. Pre operative carotid US showed no significant internal carotid artery stenosis bilaterally. ABI's were 1.2 on the right and 1.03 on the left. He underwent a CABG x 1, closure of perimembranous VSD, right and left sided Maze, and placement of left atrial clip.  Brief Hospital Course:  The patient was extubated early the morning of post operative day one. He has remained afebrile and hemodynamically stable. He was weaned off Neo synephrine, dopamine, and Milrinone.Theone Murdoch, a line, chest tubes, and foley were removed early in the post operative course. Lopressor was started and titrated accordingly. He had a history of a fib and appeared to go into it post op. He was placed on an Amiodarone gttp. His Amiodarone gttp was stopped and he was placed on oral. His rhythm post op was at times accelerated junctional, atrial tachycardia, and bradycardia. He was restarted on Cardizem CD. His heart rate was then mostly in the 60-70's. Amiodarone was discontinued.Cardiology evaluated him and then continued to follow him.He was volume over loaded and diuresed. He was weaned off the insulin drip. He was continued on Levemir. Once he was tolerating a diet, home Metformin was  restarted on post operative day 4. The patient's HGA1C pre op was 7.7.   The patient was felt surgically stable for  transfer from the ICU to PCTU for further convalescence on 01/09/2013. He continues to progress with cardiac rehab. He is ambulating on room air. He has been tolerating a diet and has had a bowel movement. His rhythm has been stable, either in  rate controlled atrial fib/flutter vs sinus rhythm, Cardizem was discontinued, and he was restarted on low dose Amiodarone by cardiology. His QT interval did become slightly prolonged on 400 mg daily, therefore he will be discharged on 200 mg daily.  He also has been restarted on Eliquis. Epicardial pacing wires and chest tube sutures will be removed prior to discharge. Provided the patient remains afebrile, hemodynamically stable, and pending morning round evaluation, he will be surgically stable for discharge on 01/12/2013.     Latest Vital Signs: Blood pressure 170/74, pulse 65, temperature 98.7 F (37.1 C), temperature source Oral, resp. rate 20, height 6' (1.829 m), weight 104.2 kg (229 lb 11.5 oz), SpO2 93.00%.  Physical Exam: Cardiovascular: RRR  Pulmonary: Clear to auscultation bilaterally; no rales, wheezes, or rhonchi.  Abdomen: Soft, non tender, bowel sounds present.  Extremities: Bilateral lower extremity edema.  Wounds: Clean and dry. No erythema or signs of infection.   Discharge Condition:Stable  Recent laboratory studies:  Lab Results  Component Value Date   WBC 12.9* 01/10/2013   HGB 12.3* 01/10/2013   HCT 36.5* 01/10/2013   MCV 91.7 01/10/2013   PLT 117* 01/10/2013   Lab Results  Component Value Date   NA 134* 01/10/2013   K 4.6 01/10/2013   CL 100 01/10/2013   CO2 24 01/10/2013   CREATININE 1.02 01/10/2013   GLUCOSE 133* 01/10/2013      Diagnostic Studies: Dg Chest 2 View  01/10/2013   *RADIOLOGY REPORT*  Clinical Data: Atrial fibrillation; postoperative evaluation; atelectasis  CHEST - 2 VIEW  Comparison: September 09, 2012  Findings: Central catheter has been removed.  No appreciable pneumothorax.  There is patchy atelectasis in both lung bases with some mild consolidation in the posterior left base.  There is a degree of underlying emphysema.  Heart is enlarged with normal pulmonary vascularity.  Pacemaker wires remain attached to the right heart.  The patient is status post  coronary artery bypass grafting. Clamp in the left atrial region appears stable.  IMPRESSION: No pneumothorax.  Patchy consolidation posterior left base.  Patchy bibasilar atelectasis present.  Heart mildly enlarged but stable.   Original Report Authenticated By: Bretta Bang, M.D.    Future Appointments Provider Department Dept Phone   02/06/2013 4:00 PM Delight Ovens, MD [facility] [phone]      Discharge Medications:   Medication List    STOP taking these medications       diltiazem 360 MG 24 hr capsule  Commonly known as:  CARDIZEM CD      TAKE these medications       acetaminophen 500 MG tablet  Commonly known as:  TYLENOL  Take 500 mg by mouth every 6 (six) hours as needed for pain.     amiodarone 200 MG tablet  Commonly known as:  PACERONE  Take 1 tablet (200 mg total) by mouth daily.     apixaban 5 MG Tabs tablet  Commonly known as:  ELIQUIS  Take 5 mg by mouth 2 (two) times daily.     aspirin 81 MG tablet  Take 81 mg by mouth daily with breakfast.     atorvastatin 20 MG tablet  Commonly known as:  LIPITOR  Take 20 mg by mouth at bedtime.     BLOOD GLUCOSE TEST STRIPS Strp  1 strip by In Vitro route 2 (two) times daily.     furosemide 40 MG tablet  Commonly known as:  LASIX  Take 40 mg by mouth daily.     insulin glargine 100 units/mL Soln  Commonly known as:  LANTUS  Inject 18 Units into the skin daily at 10 pm.     metFORMIN 500 MG 24 hr tablet  Commonly known as:  GLUCOPHAGE-XR  Take 1,000 mg by mouth 2 (two) times daily.     metoprolol tartrate 25 MG tablet  Commonly known as:  LOPRESSOR  Take 1 tablet (25 mg total) by mouth 2 (two) times daily.     omeprazole 40 MG capsule  Commonly known as:  PRILOSEC  Take 40 mg by mouth daily before breakfast.     oxyCODONE 5 MG immediate release tablet  Commonly known as:  Oxy IR/ROXICODONE  Take 1-2 tablets (5-10 mg total) by mouth every 3 (three)  hours as needed for pain.     potassium chloride SA 20 MEQ tablet  Commonly known as:  K-DUR,KLOR-CON  Take 20 mEq by mouth daily.        The patient has been discharged on:   1.Beta Blocker:  Yes [ x  ]                              No   [   ]                              If No, reason:  2.Ace Inhibitor/ARB: Yes [   ]                                     No  [  x  ]                                     If No, reason: Preserved LVEF  3.Statin:   Yes [ x  ]                  No  [   ]                  If No, reason:  4.Ecasa:  Yes  [  x ]                  No   [   ]                  If No, reason:  Follow Up Appointments:     Follow-up Information   Follow up with GERHARDT,EDWARD B, MD. (PA/LAT CXR to be taken (at [facility] which is in the same building as Dr. Dennie Maizes office) on 02/06/2013 at 3:00 pm;Appointment with Dr. Tyrone Sage is on 02/06/2013 at 4:00 pm)    Contact information:   [address] [place] [address] [phone]       Follow up with Olga Millers, MD. (Call for a follow up appointment for 2 weeks)    Contact information:   [address]  [place], [place] [address]     [phone]   Follow up with Elmo Putt, MD. (Call for a follow up appointment regarding further surveillance of HGA1C 7.7 and diabetes management)    Contact information:   [facility] [address] [place] [address] [phone]       Signed: Doree Fudge MPA-C 01/10/2013, 12:40 PM

## 2013-01-11 DIAGNOSIS — I4891 Unspecified atrial fibrillation: Secondary | ICD-10-CM

## 2013-01-11 LAB — GLUCOSE, CAPILLARY
Glucose-Capillary: 127 mg/dL — ABNORMAL HIGH (ref 70–99)
Glucose-Capillary: 156 mg/dL — ABNORMAL HIGH (ref 70–99)
Glucose-Capillary: 145 mg/dL — ABNORMAL HIGH (ref 70–99)
Glucose-Capillary: 138 mg/dL — ABNORMAL HIGH (ref 70–99)

## 2013-01-11 NOTE — Progress Notes (Addendum)
       [address]       [place] [address]             [phone]          4 Days Post-Op Procedure(s) (LRB): CORONARY ARTERY BYPASS GRAFTING (CABG) (N/A) MAZE (N/A) VENTRICULAR SEPTAL DEFECT (VSD) REPAIR (N/A) INTRAOPERATIVE TRANSESOPHAGEAL ECHOCARDIOGRAM (N/A) CLIPPING OF ATRIAL APPENDAGE (N/A)  Subjective: Comfortable, no complaints.   Objective: Vital signs in last 24 hours: Patient Vitals for the past 24 hrs:  BP Temp Temp src Pulse Resp SpO2 Weight  01/11/13 0408 149/79 mmHg 97.9 F (36.6 C) Oral 66 18 98 % 225 lb 1.4 oz (102.1 kg)  01/10/13 1954 125/76 mmHg 97.7 F (36.5 C) Oral 70 18 96 % -  01/10/13 1329 138/70 mmHg 97.9 F (36.6 C) Oral 67 18 100 % -  01/10/13 0902 170/74 mmHg - - - - - -   Current Weight  01/11/13 225 lb 1.4 oz (102.1 kg)  Pre op weight 98 kg    Intake/Output from previous day: 08/08 0701 - 08/09 0700 In: 483 [P.O.:480; I.V.:3] Out: 2000 [Urine:2000]  CBGs [phone]   PHYSICAL EXAM:  Heart: RRR Lungs: Clear Wound: Clean and dry Extremities: + LE edema    Lab Results: CBC: Recent Labs  01/09/13 0400 01/10/13 0635  WBC 14.9* 12.9*  HGB 12.1* 12.3*  HCT 34.4* 36.5*  PLT 99* 117*   BMET:  Recent Labs  01/09/13 0400 01/10/13 0635  NA 134* 134*  K 4.4 4.6  CL 101 100  CO2 22 24  GLUCOSE 175* 133*  BUN 24* 29*  CREATININE 1.42* 1.02  CALCIUM 8.0* 8.7    PT/INR: No results found for this basename: LABPROT, INR,  in the last 72 hours    Assessment/Plan: S/P Procedure(s) (LRB): CORONARY ARTERY BYPASS GRAFTING (CABG) (N/A) MAZE (N/A) VENTRICULAR SEPTAL DEFECT (VSD) REPAIR (N/A) INTRAOPERATIVE TRANSESOPHAGEAL ECHOCARDIOGRAM (N/A) CLIPPING OF ATRIAL APPENDAGE (N/A) CV- Rhythm stable, mostly maintaining SR.  Continue Amio, Lopressor, Eliquis. Appreciate cardiology's input. Vol overload- continue diuresis. DM- back on po meds.  Will wean and d/c Levemir and watch CBGs. Recheck creatinine  now that he is back on Metformin. D/c pacing wires, continue CRPI. Possibly home 1-2 days if remains stable.    LOS: 4 days    COLLINS,GINA H 01/11/2013  Walking more Wires out  On low dose eliquis, will increase dose tomorrow now wires are out Poss home in am I have seen and examined Dollene Cleveland and agree with the above assessment  and plan.  Delight Ovens MD Beeper [phone] Office [phone] 01/11/2013 12:09 PM

## 2013-01-11 NOTE — Progress Notes (Signed)
PULLED PACING WIRES AND CTS . PT TOLERATED WELL. NO DISTRESS. WIRES INTACT.

## 2013-01-11 NOTE — Progress Notes (Signed)
SUBJECTIVE: The patient is doing well today.  At this time, he denies chest pain, shortness of breath, or any new concerns.  He is ambulating without difficulty.  He denies palpitations or dizziness. He is post op day 4 from CABG, MAZE, VSD repair, and clipping of atrial appendage.   CURRENT MEDICATIONS: . amiodarone  200 mg Oral BID  . apixaban  2.5 mg Oral BID  . aspirin EC  81 mg Oral Daily  . atorvastatin  20 mg Oral QHS  . docusate sodium  200 mg Oral Daily  . furosemide  40 mg Intravenous BID  . insulin aspart  0-24 Units Subcutaneous TID AC & HS  . metFORMIN  1,000 mg Oral BID WC  . metoprolol  25 mg Oral BID  . pantoprazole  40 mg Oral QAC breakfast  . potassium chloride  20 mEq Oral Daily  . sodium chloride  3 mL Intravenous Q12H      OBJECTIVE: Physical Exam: Filed Vitals:   01/10/13 0902 01/10/13 1329 01/10/13 1954 01/11/13 0408  BP: 170/74 138/70 125/76 149/79  Pulse:  67 70 66  Temp:  97.9 F (36.6 C) 97.7 F (36.5 C) 97.9 F (36.6 C)  TempSrc:  Oral Oral Oral  Resp:  18 18 18   Height:      Weight:    225 lb 1.4 oz (102.1 kg)  SpO2:  100% 96% 98%    Intake/Output Summary (Last 24 hours) at 01/11/13 0904 Last data filed at 01/11/13 0409  Gross per 24 hour  Intake    243 ml  Output   1550 ml  Net  -1307 ml    Telemetry reveals accelerated junctional rhythm vs sinus with IVCD, no further tachycardia, qt appears long  GEN- The patient is well appearing, alert and oriented x 3 today.   Head- normocephalic, atraumatic Eyes-  Sclera clear, conjunctiva pink Ears- hearing intact Oropharynx- clear Neck- supple,  Lungs- Clear to ausculation bilaterally, normal work of breathing Heart- Regular rate and rhythm  GI- soft, NT, ND, + BS Extremities- no clubbing, cyanosis, + dependant edema Skin- no rash or lesion Psych- euthymic mood, full affect Neuro- strength and sensation are intact  LABS: Basic Metabolic Panel:  Recent Labs  30/86/57 1700   01/09/13 0400 01/10/13 0635  NA  --   < > 134* 134*  K  --   < > 4.4 4.6  CL  --   < > 101 100  CO2  --   --  22 24  GLUCOSE  --   < > 175* 133*  BUN  --   < > 24* 29*  CREATININE 1.37*  < > 1.42* 1.02  CALCIUM  --   --  8.0* 8.7  MG 2.3  --   --   --   < > = values in this interval not displayed. CBC:  Recent Labs  01/09/13 0400 01/10/13 0635  WBC 14.9* 12.9*  HGB 12.1* 12.3*  HCT 34.4* 36.5*  MCV 90.8 91.7  PLT 99* 117*    RADIOLOGY: Dg Chest 2 View 01/10/2013   *RADIOLOGY REPORT*  Clinical Data: Atrial fibrillation; postoperative evaluation; atelectasis  CHEST - 2 VIEW  Comparison: September 09, 2012  Findings: Central catheter has been removed.  No appreciable pneumothorax.  There is patchy atelectasis in both lung bases with some mild consolidation in the posterior left base.  There is a degree of underlying emphysema.  Heart is enlarged with normal pulmonary vascularity.  Pacemaker  wires remain attached to the right heart.  The patient is status post coronary artery bypass grafting. Clamp in the left atrial region appears stable.  IMPRESSION: No pneumothorax.  Patchy consolidation posterior left base.  Patchy bibasilar atelectasis present.  Heart mildly enlarged but stable.   Original Report Authenticated By: Bretta Bang, M.D.   ASSESSMENT AND PLAN:  Active Problems:   Atrial fibrillation with rapid ventricular response   Ventricular septal defect (VSD), membranous   Paroxysmal supraventricular tachycardia  1. Afib/ atrial flutter Continue amiodarone for now.  Will obtain a 12 lead ekg.  Given previously prolonged QT, will need to follow this closely with amiodarone.  Presently, rhythm appears to be sinus with IVCD vs accelerated junctional rhythm (EKG will help further evaluate). No changes at this time  2. Diastolic CHF Stable No change required today   Hillis Range, MD 01/11/2013 9:04 AM

## 2013-01-11 NOTE — Progress Notes (Signed)
CARDIAC REHAB PHASE I   PRE:  Rate/Rhythm: 72 SR    BP: sitting 133/62    SaO2: 98 RA  MODE:  Ambulation: 550 ft   POST:  Rate/Rhythm: 77 SR    BP: sitting 147/80     SaO2: 96 RA  tolerated well. No c/o. Has RW at home. Ed completed and requests his name be sent to [facility].  [id number]  Elissa Lovett [place] CES, ACSM 01/11/2013 1:36 PM

## 2013-01-12 LAB — GLUCOSE, CAPILLARY: Glucose-Capillary: 113 mg/dL — ABNORMAL HIGH (ref 70–99)

## 2013-01-12 LAB — BASIC METABOLIC PANEL
Calcium: 8.4 mg/dL (ref 8.4–10.5)
GFR calc non Af Amer: >90 mL/min (ref >90)
Glucose, Bld: 119 mg/dL — ABNORMAL HIGH (ref 70–99)
Sodium: 136 mEq/L (ref 135–145)

## 2013-01-12 MED ORDER — METOPROLOL TARTRATE 25 MG PO TABS: 25.0000 mg | ORAL_TABLET | Freq: Two times a day (BID) | ORAL | Status: DC

## 2013-01-12 MED ORDER — LACTULOSE 10 GM/15ML PO SOLN
10.0000 g | Freq: Every day | ORAL | Status: DC | PRN
  Administered 2013-01-12: 10 g via ORAL
  Filled 2013-01-12: qty 15

## 2013-01-12 MED ORDER — APIXABAN 5 MG PO TABS
5.0000 mg | ORAL_TABLET | Freq: Two times a day (BID) | ORAL | Status: DC
  Administered 2013-01-12: 5 mg via ORAL
  Filled 2013-01-12 (×2): qty 1

## 2013-01-12 MED ORDER — OXYCODONE HCL 5 MG PO TABS: 5.0000 mg | ORAL_TABLET | ORAL | Status: DC | PRN

## 2013-01-12 MED ORDER — AMIODARONE HCL 200 MG PO TABS: 200.0000 mg | ORAL_TABLET | Freq: Every day | ORAL | Status: DC

## 2013-01-12 MED ORDER — AMIODARONE HCL 200 MG PO TABS
200.0000 mg | ORAL_TABLET | Freq: Every day | ORAL | Status: DC
  Administered 2013-01-12: 200 mg via ORAL
  Filled 2013-01-12: qty 1

## 2013-01-12 MED ORDER — MAGNESIUM HYDROXIDE 400 MG/5ML PO SUSP: 30.0000 mL | Freq: Every day | ORAL | Status: DC | PRN

## 2013-01-12 NOTE — Progress Notes (Addendum)
       [address]       [place] [address]             [phone]          5 Days Post-Op Procedure(s) (LRB): CORONARY ARTERY BYPASS GRAFTING (CABG) (N/A) MAZE (N/A) VENTRICULAR SEPTAL DEFECT (VSD) REPAIR (N/A) INTRAOPERATIVE TRANSESOPHAGEAL ECHOCARDIOGRAM (N/A) CLIPPING OF ATRIAL APPENDAGE (N/A)  Subjective: Rested well last night.  Still no BM but passing flatus.  No other complaints.   Objective: Vital signs in last 24 hours: Patient Vitals for the past 24 hrs:  BP Temp Temp src Pulse Resp SpO2 Weight  01/12/13 0531 145/79 mmHg 97.6 F (36.4 C) Oral 84 16 98 % 221 lb 9 oz (100.5 kg)  01/11/13 1950 141/66 mmHg 98.7 F (37.1 C) Oral 74 18 98 % -  01/11/13 1351 133/62 mmHg 98.7 F (37.1 C) Oral 72 18 98 % -  01/11/13 1330 133/70 mmHg 98.9 F (37.2 C) - 80 18 99 % -  01/11/13 1300 133/62 mmHg 99 F (37.2 C) - 60 20 98 % -  01/11/13 1230 118/63 mmHg 98.5 F (36.9 C) - 69 18 98 % -  01/11/13 1215 132/60 mmHg 99 F (37.2 C) - 70 20 98 % -  01/11/13 1145 147/78 mmHg 98.9 F (37.2 C) - 78 18 98 % -  01/11/13 1120 146/69 mmHg 98.7 F (37.1 C) - 72 18 98 % -  01/11/13 1054 - 97.1 F (36.2 C) Oral 71 20 96 % -   Current Weight  01/12/13 221 lb 9 oz (100.5 kg)  Pre op weight 98 kg    Intake/Output from previous day: 08/09 0701 - 08/10 0700 In: -  Out: 800 [Urine:800]  CBGs [phone]    PHYSICAL EXAM:  Heart: Irr irr Lungs: Clear Wound: Clean and dry Extremities: Mild LE edema    Lab Results: CBC: Recent Labs  01/10/13 0635  WBC 12.9*  HGB 12.3*  HCT 36.5*  PLT 117*   BMET:  Recent Labs  01/10/13 0635 01/12/13 0527  NA 134* 136  K 4.6 4.3  CL 100 99  CO2 24 30  GLUCOSE 133* 119*  BUN 29* 18  CREATININE 1.02 0.83  CALCIUM 8.7 8.4    PT/INR: No results found for this basename: LABPROT, INR,  in the last 72 hours    Assessment/Plan: S/P Procedure(s) (LRB): CORONARY ARTERY BYPASS GRAFTING (CABG)  (N/A) MAZE (N/A) VENTRICULAR SEPTAL DEFECT (VSD) REPAIR (N/A) INTRAOPERATIVE TRANSESOPHAGEAL ECHOCARDIOGRAM (N/A) CLIPPING OF ATRIAL APPENDAGE (N/A) CV- Rat controlled AF this am, mostly SR overnight.  Discussed with Dr. Johney Frame- since his QT is prolonged, will decrease Amio to daily.  Increase Eliquis to home dose. Continue Lopressor. Vol overload- continue diuresis. Will switch to po Lasix. DM- back on po meds. Cr stable on Metformin. LOC today. Possibly home later today vs am if rhythm stable and bowels working.    LOS: 5 days    COLLINS,GINA H 01/12/2013  Plan d/c home today I have seen and examined Aaron Jones and agree with the above assessment  and plan.  Delight Ovens MD Beeper [phone] Office [phone] 01/12/2013 11:25 AM

## 2013-01-12 NOTE — Progress Notes (Signed)
PT DISCHARGE HOME WITH WIFE. DISCHARGE INSTRUCTIONS REVIEWED WITH PATIENT, ALONG WITH PRESCRIPTIONS. PT AND WIFE VU. ENCOURAGED PT TO CALL MD WITH ANY CONCERNS. PT LEFT UNIT WALKING. NO DISTRESS NOTED.

## 2013-01-12 NOTE — Progress Notes (Signed)
SUBJECTIVE: The patient is doing well today.  At this time, he denies chest pain, shortness of breath, or any new concerns.  He is ambulating without difficulty.  He denies palpitations or dizziness.    CURRENT MEDICATIONS: . amiodarone  200 mg Oral BID  . apixaban  2.5 mg Oral BID  . aspirin EC  81 mg Oral Daily  . atorvastatin  20 mg Oral QHS  . docusate sodium  200 mg Oral Daily  . furosemide  40 mg Intravenous BID  . insulin aspart  0-24 Units Subcutaneous TID AC & HS  . metFORMIN  1,000 mg Oral BID WC  . metoprolol  25 mg Oral BID  . pantoprazole  40 mg Oral QAC breakfast  . potassium chloride  20 mEq Oral Daily  . sodium chloride  3 mL Intravenous Q12H      OBJECTIVE: Physical Exam: Filed Vitals:   01/11/13 1330 01/11/13 1351 01/11/13 1950 01/12/13 0531  BP: 133/70 133/62 141/66 145/79  Pulse: 80 72 74 84  Temp: 98.9 F (37.2 C) 98.7 F (37.1 C) 98.7 F (37.1 C) 97.6 F (36.4 C)  TempSrc:  Oral Oral Oral  Resp: 18 18 18 16   Height:      Weight:    221 lb 9 oz (100.5 kg)  SpO2: 99% 98% 98% 98%    Intake/Output Summary (Last 24 hours) at 01/12/13 0757 Last data filed at 01/11/13 2300  Gross per 24 hour  Intake      0 ml  Output    800 ml  Net   -800 ml    Telemetry reveals sinus rhythm today with frequent PVCs, IVCD  GEN- The patient is well appearing, alert and oriented x 3 today.   Head- normocephalic, atraumatic Eyes-  Sclera clear, conjunctiva pink Ears- hearing intact Oropharynx- clear Neck- supple,  Lungs- Clear to ausculation bilaterally, normal work of breathing Heart- Regular rate and rhythm  GI- soft, NT, ND, + BS Extremities- no clubbing, cyanosis, improved edema  LABS: Basic Metabolic Panel:  Recent Labs  16/10/96 0635 01/12/13 0527  NA 134* 136  K 4.6 4.3  CL 100 99  CO2 24 30  GLUCOSE 133* 119*  BUN 29* 18  CREATININE 1.02 0.83  CALCIUM 8.7 8.4   CBC:  Recent Labs  01/10/13 0635  WBC 12.9*  HGB 12.3*  HCT 36.5*    MCV 91.7  PLT 117*    RADIOLOGY: Dg Chest 2 View 01/10/2013   *RADIOLOGY REPORT*  Clinical Data: Atrial fibrillation; postoperative evaluation; atelectasis  CHEST - 2 VIEW  Comparison: September 09, 2012  Findings: Central catheter has been removed.  No appreciable pneumothorax.  There is patchy atelectasis in both lung bases with some mild consolidation in the posterior left base.  There is a degree of underlying emphysema.  Heart is enlarged with normal pulmonary vascularity.  Pacemaker wires remain attached to the right heart.  The patient is status post coronary artery bypass grafting. Clamp in the left atrial region appears stable.  IMPRESSION: No pneumothorax.  Patchy consolidation posterior left base.  Patchy bibasilar atelectasis present.  Heart mildly enlarged but stable.   Original Report Authenticated By: Bretta Bang, M.D.   ASSESSMENT AND PLAN:  Active Problems:   Atrial fibrillation with rapid ventricular response   Ventricular septal defect (VSD), membranous   Paroxysmal supraventricular tachycardia  1. Afib/ atrial flutter ekg reviewed.  QT is long.  Would decrease amiodarone to 200mg  daily.  Avoid QT prolonging drugs  Resume eliquis home dosing  2. Diastolic CHF Stable No change required today  Plans for potential discharge are noted.  Follow-up with Dr Jens Som.  Hillis Range, MD 01/12/2013 7:57 AM

## 2013-01-13 ENCOUNTER — Telehealth: Payer: Self-pay

## 2013-01-13 LAB — GLUCOSE, CAPILLARY: Glucose-Capillary: 115 mg/dL — ABNORMAL HIGH (ref 70–99)

## 2013-01-13 NOTE — Telephone Encounter (Signed)
Message copied by Migdalia Dk on Mon Jan 13, 2013  4:36 PM ------      Message from: Coralee Rud      Created: Fri Jan 10, 2013  4:51 PM      Regarding: tcm/ph ([facility])       Dr Elease Hashimoto 01/29/2013 10:45 ------

## 2013-01-13 NOTE — Telephone Encounter (Signed)
Patient contacted regarding discharge from [facility] on 01/12/13.  Patient understands to follow up with provider Dr Elease Hashimoto on 01/29/13 at 10:45am at [facility]. Patient understands discharge instructions? yes Patient understands medications and regiment? yes Patient understands to bring all medications to this visit? yes

## 2013-01-15 ENCOUNTER — Other Ambulatory Visit: Payer: Self-pay | Admitting: *Deleted

## 2013-01-15 DIAGNOSIS — I2510 Atherosclerotic heart disease of native coronary artery without angina pectoris: Secondary | ICD-10-CM

## 2013-01-16 ENCOUNTER — Encounter (HOSPITAL_COMMUNITY): Payer: Self-pay | Admitting: General Practice

## 2013-01-16 ENCOUNTER — Ambulatory Visit (INDEPENDENT_AMBULATORY_CARE_PROVIDER_SITE_OTHER): Payer: Self-pay | Admitting: Cardiothoracic Surgery

## 2013-01-16 ENCOUNTER — Ambulatory Visit
Admission: RE | Admit: 2013-01-16 | Discharge: 2013-01-16 | Disposition: A | Discharge status: Home / Self Care | Payer: Managed Care, Other (non HMO) | Source: Ambulatory Visit | Attending: Cardiothoracic Surgery | Admitting: Cardiothoracic Surgery

## 2013-01-16 ENCOUNTER — Ambulatory Visit: Payer: Self-pay | Admitting: Cardiothoracic Surgery

## 2013-01-16 ENCOUNTER — Inpatient Hospital Stay (HOSPITAL_COMMUNITY)
Admission: AD | Admit: 2013-01-16 | Discharge: 2013-01-17 | DRG: 308 | Disposition: A | Discharge status: Home / Self Care | Payer: Managed Care, Other (non HMO) | Source: Ambulatory Visit | Attending: Cardiology | Admitting: Cardiology

## 2013-01-16 VITALS — BP 129/74 | HR 112 | Resp 16 | Ht 72.0 in | Wt 217.0 lb

## 2013-01-16 DIAGNOSIS — E785 Hyperlipidemia, unspecified: Secondary | ICD-10-CM | POA: Diagnosis present

## 2013-01-16 DIAGNOSIS — I4891 Unspecified atrial fibrillation: Principal | ICD-10-CM

## 2013-01-16 DIAGNOSIS — I5031 Acute diastolic (congestive) heart failure: Secondary | ICD-10-CM

## 2013-01-16 DIAGNOSIS — Z8679 Personal history of other diseases of the circulatory system: Secondary | ICD-10-CM

## 2013-01-16 DIAGNOSIS — Z8774 Personal history of (corrected) congenital malformations of heart and circulatory system: Secondary | ICD-10-CM

## 2013-01-16 DIAGNOSIS — I2510 Atherosclerotic heart disease of native coronary artery without angina pectoris: Secondary | ICD-10-CM

## 2013-01-16 DIAGNOSIS — K219 Gastro-esophageal reflux disease without esophagitis: Secondary | ICD-10-CM | POA: Diagnosis present

## 2013-01-16 DIAGNOSIS — N189 Chronic kidney disease, unspecified: Secondary | ICD-10-CM | POA: Diagnosis present

## 2013-01-16 DIAGNOSIS — I509 Heart failure, unspecified: Secondary | ICD-10-CM | POA: Diagnosis present

## 2013-01-16 DIAGNOSIS — Q210 Ventricular septal defect: Secondary | ICD-10-CM

## 2013-01-16 DIAGNOSIS — I452 Bifascicular block: Secondary | ICD-10-CM | POA: Diagnosis present

## 2013-01-16 DIAGNOSIS — E119 Type 2 diabetes mellitus without complications: Secondary | ICD-10-CM | POA: Diagnosis present

## 2013-01-16 DIAGNOSIS — G4730 Sleep apnea, unspecified: Secondary | ICD-10-CM | POA: Diagnosis present

## 2013-01-16 DIAGNOSIS — I10 Essential (primary) hypertension: Secondary | ICD-10-CM | POA: Diagnosis present

## 2013-01-16 DIAGNOSIS — Z8249 Family history of ischemic heart disease and other diseases of the circulatory system: Secondary | ICD-10-CM

## 2013-01-16 DIAGNOSIS — I129 Hypertensive chronic kidney disease with stage 1 through stage 4 chronic kidney disease, or unspecified chronic kidney disease: Secondary | ICD-10-CM | POA: Diagnosis present

## 2013-01-16 DIAGNOSIS — Z9889 Other specified postprocedural states: Secondary | ICD-10-CM

## 2013-01-16 DIAGNOSIS — Z951 Presence of aortocoronary bypass graft: Secondary | ICD-10-CM

## 2013-01-16 DIAGNOSIS — Z09 Encounter for follow-up examination after completed treatment for conditions other than malignant neoplasm: Secondary | ICD-10-CM

## 2013-01-16 DIAGNOSIS — R001 Bradycardia, unspecified: Secondary | ICD-10-CM

## 2013-01-16 HISTORY — DX: Atherosclerotic heart disease of native coronary artery without angina pectoris: I25.10

## 2013-01-16 HISTORY — DX: Unspecified atrial fibrillation: I48.91

## 2013-01-16 HISTORY — DX: Type 2 diabetes mellitus without complications: E11.9

## 2013-01-16 HISTORY — DX: Umbilical hernia without obstruction or gangrene: K42.9

## 2013-01-16 LAB — GLUCOSE, CAPILLARY: Glucose-Capillary: 125 mg/dL — ABNORMAL HIGH (ref 70–99)

## 2013-01-16 LAB — MAGNESIUM: Magnesium: 1.8 mg/dL (ref 1.5–2.5)

## 2013-01-16 LAB — CBC WITH DIFFERENTIAL/PLATELET
Eosinophils Absolute: 0.1 10*3/uL (ref 0.0–0.7)
Eosinophils Relative: 1 % (ref 0–5)
Hemoglobin: 11.9 g/dL — ABNORMAL LOW (ref 13.0–17.0)
Lymphocytes Relative: 16 % (ref 12–46)
Lymphs Abs: 1.2 10*3/uL (ref 0.7–4.0)
MCH: 31.8 pg (ref 26.0–34.0)
MCV: 92.8 fL (ref 78.0–100.0)
Monocytes Relative: 10 % (ref 3–12)
Neutrophils Relative %: 73 % (ref 43–77)
RBC: 3.74 MIL/uL — ABNORMAL LOW (ref 4.22–5.81)
WBC: 7.7 10*3/uL (ref 4.0–10.5)

## 2013-01-16 LAB — COMPREHENSIVE METABOLIC PANEL
ALT: 21 U/L (ref 0–53)
Alkaline Phosphatase: 70 U/L (ref 39–117)
BUN: 12 mg/dL (ref 6–23)
CO2: 31 mEq/L (ref 19–32)
GFR calc Af Amer: >90 mL/min (ref >90)
GFR calc non Af Amer: >90 mL/min (ref >90)
Glucose, Bld: 103 mg/dL — ABNORMAL HIGH (ref 70–99)
Potassium: 4 mEq/L (ref 3.5–5.1)
Total Protein: 6.2 g/dL (ref 6.0–8.3)

## 2013-01-16 MED ORDER — ATORVASTATIN CALCIUM 20 MG PO TABS
20.0000 mg | ORAL_TABLET | Freq: Every day | ORAL | Status: DC
  Administered 2013-01-16: 20 mg via ORAL
  Filled 2013-01-16 (×2): qty 1

## 2013-01-16 MED ORDER — POTASSIUM CHLORIDE CRYS ER 20 MEQ PO TBCR
20.0000 meq | EXTENDED_RELEASE_TABLET | Freq: Every day | ORAL | Status: DC
  Administered 2013-01-16 – 2013-01-17 (×2): 20 meq via ORAL
  Filled 2013-01-16 (×2): qty 1

## 2013-01-16 MED ORDER — OXYCODONE HCL 5 MG PO TABS: 5.0000 mg | ORAL_TABLET | ORAL | Status: DC | PRN

## 2013-01-16 MED ORDER — APIXABAN 5 MG PO TABS
5.0000 mg | ORAL_TABLET | Freq: Two times a day (BID) | ORAL | Status: DC
  Administered 2013-01-16 – 2013-01-17 (×2): 5 mg via ORAL
  Filled 2013-01-16 (×4): qty 1

## 2013-01-16 MED ORDER — AMIODARONE HCL 200 MG PO TABS
200.0000 mg | ORAL_TABLET | Freq: Every day | ORAL | Status: DC
  Administered 2013-01-17: 200 mg via ORAL
  Filled 2013-01-16 (×2): qty 1

## 2013-01-16 MED ORDER — SODIUM CHLORIDE 0.9 % IJ SOLN: 3.0000 mL | INTRAMUSCULAR | Status: DC | PRN

## 2013-01-16 MED ORDER — METOPROLOL TARTRATE 25 MG PO TABS
25.0000 mg | ORAL_TABLET | Freq: Two times a day (BID) | ORAL | Status: DC
  Administered 2013-01-16 – 2013-01-17 (×3): 25 mg via ORAL
  Filled 2013-01-16 (×4): qty 1

## 2013-01-16 MED ORDER — INSULIN GLARGINE 100 UNITS/ML SOLOSTAR PEN: 18.0000 [IU] | PEN_INJECTOR | Freq: Every day | SUBCUTANEOUS | Status: DC

## 2013-01-16 MED ORDER — SODIUM CHLORIDE 0.9 % IJ SOLN: 3.0000 mL | Freq: Two times a day (BID) | INTRAMUSCULAR | Status: DC

## 2013-01-16 MED ORDER — DILTIAZEM HCL 100 MG IV SOLR
5.0000 mg/h | INTRAVENOUS | Status: DC
  Administered 2013-01-16: 10 mg/h via INTRAVENOUS
  Administered 2013-01-16 – 2013-01-17 (×2): 15 mg/h via INTRAVENOUS
  Filled 2013-01-16 (×3): qty 100

## 2013-01-16 MED ORDER — PANTOPRAZOLE SODIUM 40 MG PO TBEC
40.0000 mg | DELAYED_RELEASE_TABLET | Freq: Every day | ORAL | Status: DC
  Administered 2013-01-16: 40 mg via ORAL

## 2013-01-16 MED ORDER — ACETAMINOPHEN 325 MG PO TABS: 650.0000 mg | ORAL_TABLET | ORAL | Status: DC | PRN

## 2013-01-16 MED ORDER — ASPIRIN 81 MG PO CHEW
81.0000 mg | CHEWABLE_TABLET | Freq: Every day | ORAL | Status: DC
  Administered 2013-01-17: 81 mg via ORAL
  Filled 2013-01-16 (×2): qty 1

## 2013-01-16 MED ORDER — FUROSEMIDE 40 MG PO TABS
40.0000 mg | ORAL_TABLET | Freq: Every day | ORAL | Status: DC
  Administered 2013-01-16 – 2013-01-17 (×2): 40 mg via ORAL
  Filled 2013-01-16 (×3): qty 1

## 2013-01-16 MED ORDER — SODIUM CHLORIDE 0.9 % IV SOLN: 250.0000 mL | INTRAVENOUS | Status: DC | PRN

## 2013-01-16 MED ORDER — INSULIN ASPART 100 UNIT/ML ~~LOC~~ SOLN: 0.0000 [IU] | Freq: Three times a day (TID) | SUBCUTANEOUS | Status: DC

## 2013-01-16 MED ORDER — METFORMIN HCL ER 500 MG PO TB24
1000.0000 mg | ORAL_TABLET | Freq: Two times a day (BID) | ORAL | Status: DC
  Administered 2013-01-16: 1000 mg via ORAL
  Filled 2013-01-16 (×4): qty 2

## 2013-01-16 MED ORDER — INSULIN GLARGINE 100 UNIT/ML ~~LOC~~ SOLN
18.0000 [IU] | Freq: Every day | SUBCUTANEOUS | Status: DC
  Administered 2013-01-16: 18 [IU] via SUBCUTANEOUS
  Filled 2013-01-16 (×2): qty 0.18

## 2013-01-16 NOTE — H&P (Signed)
ELECTROPHYSIOLOGY ADMISSION HISTORY & PHYSICAL  Patient ID: Aaron Jones MRN: [id number], DOB/AGE: 11-30-1945   Date of Admission: 01/16/2013  Primary Physician: Elmo Putt, MD Primary Cardiologist / EP: Jens Som, MD / Johney Frame, MD Cardiothoracic surgeon: Tyrone Sage, MD Reason for Admission: Atrial fibrillation  History of Present Illness [name] is a pleasant 67 year old man with perimembranous VSD, CAD and persistent AF s/p recent VSD repair with Dacron patch, CABG with SVG to PDA, right and left MAZE in addition to LAA clipping who presented for follow-up with Dr. Tyrone Sage today and was not feeling well. He was found to be tachycardic and an ECG showed rapid AF. He is being admitted for further evaluation and treatment.  [name] states he was "feeling pretty good" on Sunday and Monday of this week; however, Monday evening he began to feel "just blah" but couldn't identify any specific trigger or symptom. Since then he has had difficulty sleeping stating he feels like he is smothering or can't catch his breath. Sitting upright helps. He has also noticed mild but increased LE swelling and fatigue. He denies CP or palpitations. He denies dizziness, near syncope or syncope. He reports compliance with his medications. His LVEF is 45-50% by recent intraoperative TEE. He denies productive cough, fever or chills.  Mr. [name] had rapid AF during his post-op course. His medications were adjusted several times but he was ultimately discharge on low dose amiodarone (lowered due to QT prolongation), metoprolol and Eliquis. He was discharged on 01/10/2013 in SR.  Past Medical History Past Medical History  Diagnosis Date  . Diabetes mellitus without complication   . GERD (gastroesophageal reflux disease)   . Hyperlipidemia   . VSD (ventricular septal defect)   . Tachycardia, paroxysmal May 2013    Possible SVT  . Abnormal heart rhythms 11/2012    A-Fib- sees Dr Lady GaryGavin Potters   .  Hypertension     PCP- Alena Bills, phone; [phone]  . Sleep apnea     [facility]- in process of being evaluated now, states 12 yrs. ago was on CPAP but after having his deviated septum repaired, he had put the CPAP in storage. Pt. will get a new machine soon.   Marland Kitchen Heart murmur   . Chronic kidney disease     stones passed spontaneously    Past Surgical History Past Surgical History  Procedure Laterality Date  . Nasal septum surgery  2004  . Tee without cardioversion N/A 11/21/2012    Procedure: TRANSESOPHAGEAL ECHOCARDIOGRAM (TEE);  Surgeon: Dolores Patty, MD;  Location: [facility];  Service: Cardiovascular;  Laterality: N/A;  . Cardiac catheterization  > 5 yr ago    Done at [employer], reportedly clean  . Cardiac catheterization  11/2012  . Multiple extractions with alveoloplasty N/A 12/11/2012    Procedure: Extraction of tooth #'s 2,3,4,5,14,15,17,21,22,23,24,25,26,27,32 wioth alveoloplasty and bialteral fibrous tuberosity reductions.;  Surgeon: Charlynne Pander, DDS;  Location: [facility] ORS;  Service: Oral Surgery;  Laterality: N/A;  . Eye surgery  1990's    L eye  . Coronary artery bypass graft N/A 01/07/2013    Procedure: CORONARY ARTERY BYPASS GRAFTING (CABG);  Surgeon: Delight Ovens, MD;  Location: [facility] OR;  Service: Open Heart Surgery;  Laterality: N/A;  Times1 using endoscopically harvested saphenous vein graft to the PDA  . Maze N/A 01/07/2013    Procedure: MAZE;  Surgeon: Delight Ovens, MD;  Location: [facility] OR;  Service: Open Heart Surgery;  Laterality: N/A;  . Vsd  repair N/A 01/07/2013    Procedure: VENTRICULAR SEPTAL DEFECT (VSD) REPAIR;  Surgeon: Delight Ovens, MD;  Location: [facility] OR;  Service: Open Heart Surgery;  Laterality: N/A;  . Intraoperative transesophageal echocardiogram N/A 01/07/2013    Procedure: INTRAOPERATIVE TRANSESOPHAGEAL ECHOCARDIOGRAM;  Surgeon: Delight Ovens, MD;  Location: [facility] OR;  Service: Open Heart Surgery;  Laterality: N/A;  . Clipping of atrial  appendage N/A 01/07/2013    Procedure: CLIPPING OF ATRIAL APPENDAGE;  Surgeon: Delight Ovens, MD;  Location: [facility] OR;  Service: Open Heart Surgery;  Laterality: N/A;    Allergies/Intolerances Allergies  Allergen Reactions  . Biaxin [Clarithromycin] Nausea Only   Home Medications Medications Prior to Admission  Medication Sig Dispense Refill  . amiodarone (PACERONE) 200 MG tablet Take 1 tablet (200 mg total) by mouth daily.  30 tablet  1  . apixaban (ELIQUIS) 5 MG TABS tablet Take 5 mg by mouth 2 (two) times daily.      Marland Kitchen aspirin 81 MG tablet Take 81 mg by mouth daily with breakfast.      . atorvastatin (LIPITOR) 20 MG tablet Take 20 mg by mouth at bedtime.      . furosemide (LASIX) 40 MG tablet Take 40 mg by mouth daily.       . Glucose Blood (BLOOD GLUCOSE TEST STRIPS) STRP 1 strip by In Vitro route 2 (two) times daily.  100 each  0  . insulin glargine (LANTUS) 100 units/mL SOLN Inject 18 Units into the skin daily at 10 pm.      . metFORMIN (GLUCOPHAGE-XR) 500 MG 24 hr tablet Take 1,000 mg by mouth 2 (two) times daily.      . metoprolol tartrate (LOPRESSOR) 25 MG tablet Take 1 tablet (25 mg total) by mouth 2 (two) times daily.  60 tablet  1  . omeprazole (PRILOSEC) 40 MG capsule Take 40 mg by mouth daily before breakfast.       . oxyCODONE (OXY IR/ROXICODONE) 5 MG immediate release tablet Take 1-2 tablets (5-10 mg total) by mouth every 3 (three) hours as needed for pain.  30 tablet  0  . potassium chloride SA (K-DUR,KLOR-CON) 20 MEQ tablet Take 20 mEq by mouth daily.       Family History Positive for CAD   Social History Social History  . 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 Smoking status: Never Smoker   . Smokeless tobacco: Never Used  . Alcohol Use: No  . Drug Use: No   Social History Narrative   Still works full time. Works around the yard. Lives with wife. Has 2 sisters, neither with  cardiac issues.    Review of Systems General: No chills, fever, night sweats or weight changes.  Cardiovascular: No chest pain, palpitations. Dermatological: No rash, lesions or masses. Respiratory: No cough. Urologic: No hematuria, dysuria. Abdominal: No nausea, vomiting, diarrhea, bright red blood per rectum, melena, or hematemesis. Neurologic: No visual changes, weakness, changes in mental status. All other systems reviewed and are otherwise negative except as noted above.  Physical Exam Vitals: Blood pressure 148/77, pulse 83, temperature 97.6 F (36.4 C), temperature source Oral, resp. rate 16, SpO2 97.00%.  General: Well developed, well appearing 67 y.o. male in no acute distress. HEENT: Normocephalic, atraumatic. EOMs intact. Sclera nonicteric. Oropharynx clear.  Neck: Supple. No JVD. Lungs: Respirations regular and unlabored, CTA bilaterally. No wheezes, rales or rhonchi.  Heart: Irregular, tachycardic. S1, S2 present. No murmurs, rub, S3 or S4. Abdomen: Soft, non-tender, non-distended. BS present x 4 quadrants. No hepatosplenomegaly.  Extremities: No clubbing or cyanosis. Trace-1+ LE edema bilaterally. DP/PT/Radials 2+ and equal bilaterally.  Psych: Normal affect. Neuro: Alert and oriented X 3. Moves all extremities spontaneously. Musculoskeletal: No kyphosis. Skin: Intact. Warm and dry. No rashes or petechiae in exposed areas. Well healing incisions on medial aspect of LLE and chest at midline.   Labs Admission labs pending Recent labs listed below -  Lab Results  Component Value Date   WBC 12.9* 01/10/2013   HGB 12.3* 01/10/2013   HCT 36.5* 01/10/2013   MCV 91.7 01/10/2013   PLT 117* 01/10/2013    Recent Labs Lab 01/12/13 0527  NA 136  K 4.3  CL 99  CO2 30  BUN 18  CREATININE 0.83  CALCIUM 8.4  GLUCOSE 119*    Radiology/Studies Dg Chest 2 View  01/16/2013   *RADIOLOGY REPORT*  Clinical Data: Shortness of breath  CHEST - 2 VIEW  Comparison:  January 10, 2013   Findings: There is patchy consolidation in the lung bases, slightly increased.  There is a minimal left effusion.  There is a degree of underlying emphysema.  Elsewhere, lungs are clear.  Heart is mildly enlarged but stable. Pulmonary vascularity is within normal limits.  No adenopathy.  No pneumothorax.  IMPRESSION: Slight increase in bibasilar patchy consolidation.  Minimal left effusion present.  Heart mildly enlarged but stable.  No pneumothorax.   Original Report Authenticated By: Bretta Bang, M.D.    Intraoperative TEE 01/07/2013 Left ventricle: Ventricular septal defect noted below aortic valve. Systolic function was mildly reduced. The estimated ejection fraction was in the range of 45% to 50%. ------------------------------------------------------------ Aortic valve: Trileaflet. Doppler: Mild regurgitation. ------------------------------------------------------------ Mitral valve: Doppler: Mild regurgitation. ------------------------------------------------------------ Left atrium: No evidence of thrombus in the appendage. The appendage was of normal size. Emptying velocity was normal. ------------------------------------------------------------ Tricuspid valve: Structurally normal valve. Leaflet separation was normal. No evidence of vegetation. Doppler: No regurgitation.   12-lead ECG shows an irregular WCT - most consistent with rapid AF and underlying LBBB Telemetry shows rapid AF in 130s   Assessment and Plan 1. Atrial fibrillation w/RVR 2. LBBB 3. Recent VSD repair, CABG and MAZE with LAA clipping 4. Mild LV dysfunction, EF 45-50%, by intraoperative TEE 5. CAD 6. HTN 7. DM  Mr. Kempfer presents with increasing orthopnea, PND and fatigue x 3 days, found to have rapid AF and mild volume overload. He will be admitted to telemetry. Check CBC, BMET and TSH to rule out hematologic abnormality, specifically anemia since recent CT surgery, electrolyte derangement or thyroid  dysfunction. No need to repeat echo at this time. Continue low dose PO amiodarone and BB. Start IV diltiazem infusion per protocol and follow rate and BP. Will give Lasix 40 mg IV x 1 dose now. Strict I/Os and daily weights.     Dr. Ladona Ridgel to see Signed, Rick Duff, PA-C 01/16/2013, 12:36 PM  EP Attending  Patient seen and examined. Agree with above history, physical exam, assessment and plan. The patient has developed post op atrial fib since discharge from the hospital and is symptomatic. He has undergone recent CABG/MAZE/LAA clipping and VSD repair. I would admit and place on IV cardizem and consider DCCV in a.m. If he does not spontaneously convert to nsr. He has been therapeutically anti-coagulated with Eliqus and left hospital in NSR.   Leonia Reeves.D.

## 2013-01-16 NOTE — Progress Notes (Signed)
[address]       [place] [address]             [phone]                  Yomar Mejorado [facility] Medical Record #[id number] Date of Birth: 03-11-1946  Lewayne Bunting, MD Elmo Putt, MD  Chief Complaint:   PostOp Follow Up Visit 01/07/2013  PREOPERATIVE DIAGNOSIS:  1. 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  2. Coronary artery disease of the posterior descending coronary  artery.  3. Persistent atrial fibrillation.  POSTOPERATIVE DIAGNOSIS:  1. 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  2. Coronary artery disease of the posterior descending coronary  artery.  3. Persistent atrial fibrillation.  SURGICAL PROCEDURE:  1. Closure of perimembranous ventricular septal defect with Dacron  patch.  2. Coronary artery bypass grafting x1 with reverse saphenous vein  graft to the posterior descending.  3. Right and left Maze procedure with bipolar RF and cryo.  4. Placement of 45 mm Accu-Cure clip, left atrial clip.  5. Right endoscopic vein harvesting.  SURGEON: Sheliah Plane, MD   History of Present Illness:      Patient notes increasing shortness of breath at night the past 24 hours. Since she was discharged home from the hospital he has been active without difficulty. Because of shortness of breath at night he called the office and came in to be seen. On exam in the office he was found to have a rapid irregular heartbeat, rhythm strip showed atrial flutter with 2 to one block.  Prior to Discharge the patient had been maintaining sinus rhythm. He does have a history of prolonged QT. He was seen by Dr. Johney Frame on the day of discharge and medications adjusted.      History  Smoking status  . Never Smoker   Smokeless tobacco  . Never Used       Allergies  Allergen Reactions  . Biaxin [Clarithromycin] Nausea Only    Current Outpatient Prescriptions  Medication Sig Dispense Refill  .  acetaminophen (TYLENOL) 500 MG tablet Take 500 mg by mouth every 6 (six) hours as needed for pain.      Marland Kitchen amiodarone (PACERONE) 200 MG tablet Take 1 tablet (200 mg total) by mouth daily.  30 tablet  1  . apixaban (ELIQUIS) 5 MG TABS tablet Take 5 mg by mouth 2 (two) times daily.      Marland Kitchen aspirin 81 MG tablet Take 81 mg by mouth daily with breakfast.      . atorvastatin (LIPITOR) 20 MG tablet Take 20 mg by mouth at bedtime.      . furosemide (LASIX) 40 MG tablet Take 40 mg by mouth daily.       . Glucose Blood (BLOOD GLUCOSE TEST STRIPS) STRP 1 strip by In Vitro route 2 (two) times daily.  100 each  0  . insulin glargine (LANTUS) 100 units/mL SOLN Inject 18 Units into the skin daily at 10 pm.      . metFORMIN (GLUCOPHAGE-XR) 500 MG 24 hr tablet Take 1,000 mg by mouth 2 (two) times daily.      . metoprolol tartrate (LOPRESSOR) 25 MG tablet Take 1 tablet (25 mg total) by mouth 2 (two) times daily.  60 tablet  1  . omeprazole (PRILOSEC) 40 MG capsule Take 40 mg by mouth daily before breakfast.       .  oxyCODONE (OXY IR/ROXICODONE) 5 MG immediate release tablet Take 1-2 tablets (5-10 mg total) by mouth every 3 (three) hours as needed for pain.  30 tablet  0  . potassium chloride SA (K-DUR,KLOR-CON) 20 MEQ tablet Take 20 mEq by mouth daily.       No current facility-administered medications for this visit.       Physical Exam: BP 129/74  Pulse 112  Resp 16  Ht 6' (1.829 m)  Wt 217 lb (98.431 kg)  BMI 29.42 kg/m2  SpO2 95%  General appearance: alert, cooperative and appears stated age Neurologic: intact Heart: irregularly irregular rhythm, rhythm strip shows heart rate 150, I do not appreciate any murmur or rub Lungs: clear to auscultation bilaterally Abdomen: soft, non-tender; bowel sounds normal; no masses,  no organomegaly Extremities: extremities normal, atraumatic, no cyanosis or edema and Homans sign is negative, no sign of DVT Wound: Sternal incision is well-healed,    Diagnostic  Studies & Laboratory data:         Recent Radiology Findings: Dg Chest 2 View  01/16/2013   *RADIOLOGY REPORT*  Clinical Data: Shortness of breath  CHEST - 2 VIEW  Comparison:  January 10, 2013  Findings: There is patchy consolidation in the lung bases, slightly increased.  There is a minimal left effusion.  There is a degree of underlying emphysema.  Elsewhere, lungs are clear.  Heart is mildly enlarged but stable. Pulmonary vascularity is within normal limits.  No adenopathy.  No pneumothorax.  IMPRESSION: Slight increase in bibasilar patchy consolidation.  Minimal left effusion present.  Heart mildly enlarged but stable.  No pneumothorax.   Original Report Authenticated By: Bretta Bang, M.D.      Recent Labs: Lab Results  Component Value Date   WBC 12.9* 01/10/2013   HGB 12.3* 01/10/2013   HCT 36.5* 01/10/2013   PLT 117* 01/10/2013   GLUCOSE 119* 01/12/2013   ALT 24 01/03/2013   AST 20 01/03/2013   NA 136 01/12/2013   K 4.3 01/12/2013   CL 99 01/12/2013   CREATININE 0.83 01/12/2013   BUN 18 01/12/2013   CO2 30 01/12/2013   TSH 1.571 11/17/2012   INR 1.51* 01/07/2013   HGBA1C 7.7* 01/03/2013      Assessment / Plan:   1 Patient back in atrial flutter following closure of VSD, and left and right maze procedure, and placement of atrial clip-with the rapid heart rate have discussed with cardiology the patient will be admitted for rhythm control. 2 patient on anticoagulation     Zahi Plaskett B 01/16/2013 10:40 AM

## 2013-01-17 ENCOUNTER — Encounter (HOSPITAL_COMMUNITY): Payer: Self-pay | Admitting: Anesthesiology

## 2013-01-17 ENCOUNTER — Encounter (HOSPITAL_COMMUNITY): Payer: Self-pay | Admitting: Nurse Practitioner

## 2013-01-17 ENCOUNTER — Inpatient Hospital Stay (HOSPITAL_COMMUNITY): Payer: Managed Care, Other (non HMO) | Admitting: Anesthesiology

## 2013-01-17 DIAGNOSIS — I4891 Unspecified atrial fibrillation: Secondary | ICD-10-CM

## 2013-01-17 DIAGNOSIS — I498 Other specified cardiac arrhythmias: Secondary | ICD-10-CM

## 2013-01-17 DIAGNOSIS — I5031 Acute diastolic (congestive) heart failure: Secondary | ICD-10-CM

## 2013-01-17 LAB — BASIC METABOLIC PANEL
CO2: 32 mEq/L (ref 19–32)
Calcium: 8.7 mg/dL (ref 8.4–10.5)
Potassium: 3.7 mEq/L (ref 3.5–5.1)
Sodium: 139 mEq/L (ref 135–145)

## 2013-01-17 MED ORDER — SODIUM CHLORIDE 0.9 % IV SOLN
INTRAVENOUS | Status: DC | PRN
  Administered 2013-01-17: 10:00:00 via INTRAVENOUS

## 2013-01-17 MED ORDER — SODIUM CHLORIDE 0.9 % IV SOLN: 250.0000 mL | INTRAVENOUS | Status: DC

## 2013-01-17 MED ORDER — SODIUM CHLORIDE 0.9 % IJ SOLN: 3.0000 mL | INTRAMUSCULAR | Status: DC | PRN

## 2013-01-17 MED ORDER — PROPOFOL 10 MG/ML IV BOLUS
INTRAVENOUS | Status: DC | PRN
  Administered 2013-01-17: 50 mg via INTRAVENOUS

## 2013-01-17 MED ORDER — AMIODARONE HCL 200 MG PO TABS: ORAL_TABLET | ORAL | Status: DC

## 2013-01-17 MED ORDER — SODIUM CHLORIDE 0.9 % IJ SOLN: 3.0000 mL | Freq: Two times a day (BID) | INTRAMUSCULAR | Status: DC

## 2013-01-17 MED ORDER — LIDOCAINE HCL (CARDIAC) 20 MG/ML IV SOLN
INTRAVENOUS | Status: DC | PRN
  Administered 2013-01-17: 30 mg via INTRAVENOUS

## 2013-01-17 NOTE — Care Management Note (Unsigned)
    Page 1 of 1   01/17/2013     3:56:42 PM   CARE MANAGEMENT NOTE 01/17/2013  Patient:  Aaron Jones,Aaron Jones   Account Number:  [id number]  Date Initiated:  01/17/2013  Documentation initiated by:  Dreya Buhrman  Subjective/Objective Assessment:   PT ADM ON 8/14 WITH ATRIAL FLUTTER.  PTA, PT INDEPENDENT, LIVES WITH SPOUSE.     Action/Plan:   WILL FOLLOW FOR HOME NEEDS AS PT PROGRESSES.   Anticipated DC Date:  01/19/2013   Anticipated DC Plan:  HOME/SELF CARE      DC Planning Services  CM consult      Choice offered to / List presented to:             Status of service:  In process, will continue to follow Medicare Important Message given?   (If response is "NO", the following Medicare IM given date fields will be blank) Date Medicare IM given:   Date Additional Medicare IM given:    Discharge Disposition:    Per UR Regulation:  Reviewed for med. necessity/level of care/duration of stay  If discussed at Long Length of Stay Meetings, dates discussed:    Comments:

## 2013-01-17 NOTE — Transfer of Care (Signed)
Immediate Anesthesia Transfer of Care Note  Patient: Aaron Jones  Procedure(s) Performed: * No procedures listed *  Patient Location: Nursing Unit  Anesthesia Type:General  Level of Consciousness: awake, alert  and oriented  Airway & Oxygen Therapy: Patient Spontanous Breathing and Patient connected to nasal cannula oxygen  Post-op Assessment: Report given to PACU RN, Post -op Vital signs reviewed and stable, Patient moving all extremities and Patient able to stick tongue midline  Post vital signs: Reviewed and stable  Complications: No apparent anesthesia complications

## 2013-01-17 NOTE — Progress Notes (Signed)
SUBJECTIVE: The patient thinks he is doing better today. Still with orthopnea and fatigue.   I/O -1L yesterday-- weight down 7 pounds (???)  He was admitted yesterday with recurrent afib and RVR.  He has been placed on a Cardizem gtt with better rate control. He has been on Eliquis 5mg  bid since discharge earlier this month (at that time he was in SR).     CURRENT MEDICATIONS: . amiodarone  200 mg Oral Daily  . apixaban  5 mg Oral BID  . aspirin  81 mg Oral Q breakfast  . atorvastatin  20 mg Oral QHS  . furosemide  40 mg Oral Q breakfast  . insulin aspart  0-15 Units Subcutaneous TID WC  . insulin glargine  18 Units Subcutaneous QHS  . metFORMIN  1,000 mg Oral BID WC  . metoprolol tartrate  25 mg Oral BID  . pantoprazole  40 mg Oral Daily  . potassium chloride SA  20 mEq Oral Daily  . sodium chloride  3 mL Intravenous Q12H   . diltiazem (CARDIZEM) infusion 15 mg/hr (01/17/13 0557)    OBJECTIVE: Physical Exam: Filed Vitals:   01/16/13 1500 01/16/13 1800 01/16/13 2020 01/17/13 0458  BP: 148/80 131/82 143/69 124/62  Pulse: 120 92 97 85  Temp:   97.9 F (36.6 C) 98.2 F (36.8 C)  TempSrc:   Oral Oral  Resp:   17 18  Height:      Weight:    210 lb 14.4 oz (95.664 kg)  SpO2:  98% 97% 96%    Intake/Output Summary (Last 24 hours) at 01/17/13 1478 Last data filed at 01/17/13 0300  Gross per 24 hour  Intake      0 ml  Output   1000 ml  Net  -1000 ml    Telemetry reveals atrial fibrillation with controlled ventricular response  GEN- The patient is well appearing, alert and oriented x 3 today.   Head- normocephalic, atraumatic Eyes-  Sclera clear, conjunctiva pink Ears- hearing intact Oropharynx- clear Neck- supple  Lungs- decreased BS at the bases, normal work of breathing Heart- irregular rate and rhythm  GI- soft, NT, ND, + BS Extremities- no clubbing, cyanosis, + edema Neuro- strength and sensation are intact  LABS: Basic Metabolic Panel:  Recent Labs  29/56/21 1340 01/17/13 0425  NA 136 139  K 4.0 3.7  CL 98 101  CO2 31 32  GLUCOSE 103* 93  BUN 12 10  CREATININE 0.83 0.77  CALCIUM 9.2 8.7  MG 1.8  --    Liver Function Tests:  Recent Labs  01/16/13 1340  AST 17  ALT 21  ALKPHOS 70  BILITOT 0.5  PROT 6.2  ALBUMIN 2.8*   CBC:  Recent Labs  01/16/13 1340  WBC 7.7  NEUTROABS 5.6  HGB 11.9*  HCT 34.7*  MCV 92.8  PLT 373   Thyroid Function Tests:  Recent Labs  01/16/13 1340  TSH 1.390   RADIOLOGY: Dg Chest 2 View 01/16/2013   *RADIOLOGY REPORT*  Clinical Data: Shortness of breath  CHEST - 2 VIEW  Comparison:  January 10, 2013  Findings: There is patchy consolidation in the lung bases, slightly increased.  There is a minimal left effusion.  There is a degree of underlying emphysema.  Elsewhere, lungs are clear.  Heart is mildly enlarged but stable. Pulmonary vascularity is within normal limits.  No adenopathy.  No pneumothorax.  IMPRESSION: Slight increase in bibasilar patchy consolidation.  Minimal left effusion present.  Heart  mildly enlarged but stable.  No pneumothorax.   Original Report Authenticated By: Bretta Bang, M.D.   ASSESSMENT AND PLAN:  Active Problems:   Atrial fibrillation with rapid ventricular response  1. Persistent atrial fibrillation The patient returns in symptomatic afib with RVR.  He has been adequately anticoagulated.  I agree with Dr Ladona Ridgel that cardioversion is prudent at this time. RIsks, benefits, and alternatives to cardioversion were discussed with the patient who wishes to proceed.  [facility] is scheduled for later this am with anesthesia. Will hold IV cardizem until after cardioversion due to prior bradycardia observed when in sinus.  He has bifascicular block today which I think is rate related.  Will need to reassess in sinus. Could consider increasing his amiodarone dose, however I would like to assess his QT interval in sinus first.  2. Bradycardia Previously asymptomatic Will  stop cardizem drip and reassess heart rates/ conduction after cardioversion  3. Acute diastolic chf Improved with diuresis.  I think that once we establish sinus rhythm that he will do better.  4. CABG/MAZE/LAA clipping/ VSD repair Improving slowly  Once back in sinus could consider discharge later today.  Will need close followup with Dr Jens Som.

## 2013-01-17 NOTE — CV Procedure (Signed)
Preop Dx afib/flutter Post op DX  NSR   Procedure  DC Cardioversion   Pt was sedated by anesthesia receiving 50 mg Propafol  A synchronized shock 100 joules restored sinus Rhythm   Pt tolerated without difficulty

## 2013-01-17 NOTE — Addendum Note (Signed)
Addendum created 01/17/13 1320 by Luster Landsberg, CRNA   Modules edited: Anesthesia Flowsheet

## 2013-01-17 NOTE — Anesthesia Preprocedure Evaluation (Signed)
Anesthesia Evaluation  Patient identified by MRN, date of birth, ID band Patient awake    Reviewed: Allergy & Precautions, H&P , NPO status , Patient's Chart, lab work & pertinent test results  Airway Mallampati: II TM Distance: >3 FB     Dental   Pulmonary sleep apnea ,  + rhonchi         Cardiovascular hypertension, + CAD and + CABG + dysrhythmias Atrial Fibrillation Rhythm:Irregular Rate:Tachycardia     Neuro/Psych    GI/Hepatic GERD-  ,  Endo/Other  diabetes  Renal/GU      Musculoskeletal   Abdominal   Peds  Hematology   Anesthesia Other Findings   Reproductive/Obstetrics                           Anesthesia Physical Anesthesia Plan  ASA: III  Anesthesia Plan: General   Post-op Pain Management:    Induction: Intravenous  Airway Management Planned: Mask  Additional Equipment:   Intra-op Plan:   Post-operative Plan:   Informed Consent: I have reviewed the patients History and Physical, chart, labs and discussed the procedure including the risks, benefits and alternatives for the proposed anesthesia with the patient or authorized representative who has indicated his/her understanding and acceptance.     Plan Discussed with: CRNA and Surgeon  Anesthesia Plan Comments:         Anesthesia Quick Evaluation

## 2013-01-17 NOTE — H&P (Signed)
Updated   Reviewed Dr Fawn Kirk note On apixoban For DCCV

## 2013-01-17 NOTE — Discharge Summary (Signed)
Patient ID: Aaron Jones,  MRN: [id number], DOB/AGE: [birth year] 67 y.o.  Admit date: 01/16/2013 Discharge date: 01/17/2013  Primary Care Provider: Elmo Putt Primary Cardiologist: Katherina Right, MD [facility])  Discharge Diagnoses Principal Problem:   Atrial fibrillation with rapid ventricular response  **s/p prior R & L Maze and LAA clipping  **DCCV this admission.  **Amiodarone increased to 200mg  bid x 2 wks then will reduce back to 200mg  daily. Active Problems:   Acute diastolic heart failure  **Net negative diuresis of 1 Liter this admission.    **D/C weight = 210 lbs.   Type II or unspecified type diabetes mellitus without mention of complication, not stated as uncontrolled   Ventricular septal defect (VSD), membranous  **s/p closure 8/5.   CAD  **s/p CABG x 1, 8/5.   Essential hypertension, benign   Dyslipidemia  Allergies Allergies  Allergen Reactions  . Biaxin [Clarithromycin] Nausea Only   Procedures  Cardioversion 01/17/2013  Pt was sedated by anesthesia receiving 50 mg Propafol A synchronized shock 100 joules restored sinus Rhythm  Pt tolerated without difficulty  _____________   History of Present Illness  67 year old male with prior history of perimembranous ventricular septal defect, coronary artery disease, and atrial fibrillation, status post VSD repair with coronary artery bypass grafting x1 (vein graft-PDA), and maze procedure with left atrial appendage clipping on 01/07/2013. Postoperatively, patient was maintained on amiodarone and Apixaban therapy.  He was seen by Dr. Tyrone Sage on 8/14, for a routine f/u appt and was found to be in atrial fibrillation with rapid ventricular response.  Decision was made to admit him to [facility] for cardiology evaluation and rhythm management.    Hospital Course  Upon admission, pt was stable.  He was placed on IV diltiazem and home doses of amiodarone and apixaban were continued.  He was seen by  electrophysiology and decision was made to pursue cardioversion this morning.  IV diltiazem was discontinued.  Patient underwent successful DCCV, using 100 Joules, with restoration of sinus rhythm and stable intervals and HR.  As such, his amiodarone dose has been increased to 200mg  bid for the next two weeks and then he will reduce the dose back to 200mg  daily.  He will remain on apixaban therapy.  He is being discharged home today in good condition and has f/u in our [place] office in approximately 2 wks.  Discharge Vitals Blood pressure 124/70, pulse 80, temperature 98.4 F (36.9 C), temperature source Oral, resp. rate 18, height 6' (1.829 m), weight 210 lb 14.4 oz (95.664 kg), SpO2 96.00%.  Filed Weights   01/16/13 1300 01/17/13 0458  Weight: 217 lb (98.431 kg) 210 lb 14.4 oz (95.664 kg)   Labs  CBC  Recent Labs  01/16/13 1340  WBC 7.7  NEUTROABS 5.6  HGB 11.9*  HCT 34.7*  MCV 92.8  PLT 373   Basic Metabolic Panel  Recent Labs  01/16/13 1340 01/17/13 0425  NA 136 139  K 4.0 3.7  CL 98 101  CO2 31 32  GLUCOSE 103* 93  BUN 12 10  CREATININE 0.83 0.77  CALCIUM 9.2 8.7  MG 1.8  --    Liver Function Tests  Recent Labs  01/16/13 1340  AST 17  ALT 21  ALKPHOS 70  BILITOT 0.5  PROT 6.2  ALBUMIN 2.8*   Thyroid Function Tests  Recent Labs  01/16/13 1340  TSH 1.390   Disposition  Pt is being discharged home today in good condition.  Follow-up Plans & Appointments  Follow-up Information   Follow up with Dr. Delane Ginger On 01/29/2013. (10:45 AM)    Contact information:   [facility] [phone]      Follow up with Delight Ovens, MD On 02/06/2013. (4 PM)    Specialty:  Cardiothoracic Surgery   Contact information:   [address] [place] [address] [phone]       Follow up with Charlynne Pander, DDS On 02/10/2013. (9:15 AM)    Specialty:  Dentistry   Contact information:    [address] [place] [address] [phone]     Discharge Medications    Medication List         amiodarone 200 MG tablet  Commonly known as:  PACERONE  1 tab BID x 2 wks then reduce dose to 1 tab Daily.     apixaban 5 MG Tabs tablet  Commonly known as:  ELIQUIS  Take 5 mg by mouth 2 (two) times daily.     aspirin 81 MG tablet  Take 81 mg by mouth daily with breakfast.     atorvastatin 20 MG tablet  Commonly known as:  LIPITOR  Take 20 mg by mouth at bedtime.     BLOOD GLUCOSE TEST STRIPS Strp  1 strip by In Vitro route 2 (two) times daily.     furosemide 40 MG tablet  Commonly known as:  LASIX  Take 40 mg by mouth daily.     insulin glargine 100 units/mL Soln  Commonly known as:  LANTUS  Inject 18 Units into the skin daily at 10 pm.     metFORMIN 500 MG 24 hr tablet  Commonly known as:  GLUCOPHAGE-XR  Take 1,000 mg by mouth 2 (two) times daily.     metoprolol tartrate 25 MG tablet  Commonly known as:  LOPRESSOR  Take 1 tablet (25 mg total) by mouth 2 (two) times daily.     omeprazole 40 MG capsule  Commonly known as:  PRILOSEC  Take 40 mg by mouth daily before breakfast.     oxyCODONE 5 MG immediate release tablet  Commonly known as:  Oxy IR/ROXICODONE  Take 1-2 tablets (5-10 mg total) by mouth every 3 (three) hours as needed for pain.     potassium chloride SA 20 MEQ tablet  Commonly known as:  K-DUR,KLOR-CON  Take 20 mEq by mouth daily.      Outstanding Labs/Studies  None  Duration of Discharge Encounter   Greater than 30 minutes including physician time.  Signed, Nicolasa Ducking NP 01/17/2013, 2:27 PM    Hillis Range MD

## 2013-01-17 NOTE — Progress Notes (Signed)
Discharged to home with family office visits in place teaching done  

## 2013-01-17 NOTE — Anesthesia Postprocedure Evaluation (Signed)
  Anesthesia Post-op Note  Patient: Aaron Jones  Procedure(s) Performed: * No procedures listed *  Patient Location: Nursing Unit  Anesthesia Type:General  Level of Consciousness: awake, alert  and oriented  Airway and Oxygen Therapy: Patient Spontanous Breathing and Patient connected to nasal cannula oxygen  Post-op Pain: none  Post-op Assessment: Post-op Vital signs reviewed, Patient's Cardiovascular Status Stable, Respiratory Function Stable, Patent Airway, No signs of Nausea or vomiting, Adequate PO intake and Pain level controlled  Post-op Vital Signs: Reviewed and stable  Complications: No apparent anesthesia complications

## 2013-01-20 ENCOUNTER — Telehealth: Payer: Self-pay | Admitting: Cardiology

## 2013-01-20 NOTE — Telephone Encounter (Signed)
Spoke with pt sister in law, the pt cont to be in regular rhythm. His heart rate is 53 today. He has a follow up appt with dr Elease Hashimoto in [place] in about 2 weeks. Pt to call the office if he has further problems.

## 2013-01-20 NOTE — Telephone Encounter (Signed)
New problem   Pt thinks he may need to come in due to heart rate increase on Friday & Saturday

## 2013-01-29 ENCOUNTER — Encounter: Payer: Self-pay | Admitting: Cardiovascular Disease

## 2013-01-29 ENCOUNTER — Ambulatory Visit (INDEPENDENT_AMBULATORY_CARE_PROVIDER_SITE_OTHER): Payer: Managed Care, Other (non HMO) | Admitting: Cardiovascular Disease

## 2013-01-29 VITALS — BP 122/60 | HR 86 | Ht 72.0 in | Wt 203.5 lb

## 2013-01-29 DIAGNOSIS — R0602 Shortness of breath: Secondary | ICD-10-CM

## 2013-01-29 DIAGNOSIS — R079 Chest pain, unspecified: Secondary | ICD-10-CM

## 2013-01-29 DIAGNOSIS — Z9889 Other specified postprocedural states: Secondary | ICD-10-CM

## 2013-01-29 DIAGNOSIS — Z8774 Personal history of (corrected) congenital malformations of heart and circulatory system: Secondary | ICD-10-CM

## 2013-01-29 MED ORDER — AMIODARONE HCL 200 MG PO TABS: 200.0000 mg | ORAL_TABLET | Freq: Every day | ORAL | Status: DC

## 2013-01-29 NOTE — Assessment & Plan Note (Signed)
We'll get an echocardiogram to get a baseline following his ventricular septal defect closure. He still is a loud systolic murmur consistent with possible tricuspid regurgitation and he also has a mitral regurgitation murmur.  We'll see him again in 3 months

## 2013-01-29 NOTE — Patient Instructions (Addendum)
Your physician has requested that you have an echocardiogram NEXT WEEK . Echocardiography is a painless test that uses sound waves to create images of your heart. It provides your doctor with information about the size and shape of your heart and how well your heart's chambers and valves are working. This procedure takes approximately one hour. There are no restrictions for this procedure.  Your physician recommends that you schedule a follow-up appointment in: 3 MONTHS  Your physician recommends that you return for a FASTING lipid profile: 3 MONTHS BMET LIPIDS LIVER TSH  Your physician has recommended you make the following change in your medication:   DECREASE AMIODARONE TO 200 MG DAILY

## 2013-01-29 NOTE — Progress Notes (Signed)
Aaron Jones Date of Birth  03/22/1946       [facility] Office [address], [address]  [address] [place], [place]  [address]   [place], [place]  [address] [phone]     [phone]   Fax  [phone]    Fax [phone]  Problem List: 1. Atrial fibrillation- s/p  2. Hypertension 3. Hyperlipidemia 4. Mitral valve prolapse 5. Diabetes mellitus 6. Ventricular septal defect  - s/p closure 7. CABG - SVG to RCA   History of Present Illness:  Aaron Jones is a 67 yo with the above noted hx.  he has had a heart murmur all of his life presumably due to this ventricular septal defect. He was admitted to [facility] for congestive heart failure, rapid atrial fibrillation and was found to have a ventricular septal defect. He was transferred [facility]. He was seen by the surgical team and eventually had closure of his perimembranous VSD by Dr. Tyrone Sage.  He also had saphenous face and saphenous vein grafting to his right coronary artery at that time. He also had a right and left atrial Maze procedure as well as left atrial clipping.  He had recurrent atrial fibrillation and had an cardioversion.  He's been feeling well since that time. He has been ambulating without too much difficulty.    He does have some right shoulder pain since the hospitalization.  He is asymptomatic from an A-Fib standpoint.  He cannot tell if his rhythm is regular or not.   Current Outpatient Prescriptions on File Prior to Visit  Medication Sig Dispense Refill  . amiodarone (PACERONE) 200 MG tablet 1 tab BID x 2 wks then reduce dose to 1 tab Daily.  60 tablet  3  . apixaban (ELIQUIS) 5 MG TABS tablet Take 5 mg by mouth 2 (two) times daily.      Marland Kitchen aspirin 81 MG tablet Take 81 mg by mouth daily with breakfast.      . atorvastatin (LIPITOR) 20 MG tablet Take 20 mg by mouth at bedtime.      . furosemide (LASIX) 40 MG tablet Take 40 mg  by mouth daily.       . Glucose Blood (BLOOD GLUCOSE TEST STRIPS) STRP 1 strip by In Vitro route 2 (two) times daily.  100 each  0  . insulin glargine (LANTUS) 100 units/mL SOLN Inject 18 Units into the skin daily at 10 pm.      . metFORMIN (GLUCOPHAGE-XR) 500 MG 24 hr tablet Take 1,000 mg by mouth 2 (two) times daily.      . metoprolol tartrate (LOPRESSOR) 25 MG tablet Take 1 tablet (25 mg total) by mouth 2 (two) times daily.  60 tablet  1  . omeprazole (PRILOSEC) 40 MG capsule Take 40 mg by mouth daily before breakfast.       . oxyCODONE (OXY IR/ROXICODONE) 5 MG immediate release tablet Take 1-2 tablets (5-10 mg total) by mouth every 3 (three) hours as needed for pain.  30 tablet  0  . potassium chloride SA (K-DUR,KLOR-CON) 20 MEQ tablet Take 20 mEq by mouth daily.       No current facility-administered medications on file prior to visit.    Allergies  Allergen Reactions  . Biaxin [Clarithromycin] Nausea Only    Past Medical History  Diagnosis Date  . GERD (gastroesophageal reflux disease)   . Hyperlipidemia   . VSD (ventricular  septal defect)     a. 01/2013 s/p VSD closure (dacron patch)  . Hypertension     PCP- Alena Bills, phone; [phone]  . Coronary artery disease     a. 01/2013 s/p CABG x 1 (VG->PDA) in setting of VSD repair.  . Sleep apnea     [facility]- in process of being evaluated now, states 12 yrs. ago was on CPAP but after having his deviated septum repaired, he had put the CPAP in storage. Pt. will get a new machine soon.   . Type II diabetes mellitus   . Umbilical hernia     "not repaired" (02/03/13)  . Kidney stones     "6-7 times; they passed on their own" (2013-02-03)  . Tachycardia, paroxysmal May 2013    Possible SVT  . Atrial fibrillation with rapid ventricular response 02-03-13    a. 01/2013 s/p R & L Maze and LAA clipping in setting of VSD repair;  b. On amiodarone/Apixaban;  c. 01/2013 Recurrent afib requiring DCCV.    Past Surgical History    Procedure Laterality Date  . Nasal septum surgery  2004  . Tee without cardioversion N/A 11/21/2012    Procedure: TRANSESOPHAGEAL ECHOCARDIOGRAM (TEE);  Surgeon: Dolores Patty, MD;  Location: [facility] ENDOSCOPY;  Service: Cardiovascular;  Laterality: N/A;  . Cardiac catheterization  > 5 yr ago    Done at [facility], reportedly clean  . Cardiac catheterization  11/2012  . Multiple extractions with alveoloplasty N/A 12/11/2012    Procedure: Extraction of tooth #'s 2,3,4,5,14,15,17,21,22,23,24,25,26,27,32 wioth alveoloplasty and bialteral fibrous tuberosity reductions.;  Surgeon: Charlynne Pander, DDS;  Location: [facility] ORS;  Service: Oral Surgery;  Laterality: N/A;  . Eye surgery Left 1990's    "clipped muscle so it wouldn't be pulling up" (2013/02/03)  . Coronary artery bypass graft N/A 01/07/2013    Procedure: CORONARY ARTERY BYPASS GRAFTING (CABG);  Surgeon: Delight Ovens, MD;  Location: [facility] OR;  Service: Open Heart Surgery;  Laterality: N/A;  Times1 using endoscopically harvested saphenous vein graft to the PDA  . Maze N/A 01/07/2013    Procedure: MAZE;  Surgeon: Delight Ovens, MD;  Location: [facility] OR;  Service: Open Heart Surgery;  Laterality: N/A;  . Vsd repair N/A 01/07/2013    Procedure: VENTRICULAR SEPTAL DEFECT (VSD) REPAIR;  Surgeon: Delight Ovens, MD;  Location: [facility] OR;  Service: Open Heart Surgery;  Laterality: N/A;  . Intraoperative transesophageal echocardiogram N/A 01/07/2013    Procedure: INTRAOPERATIVE TRANSESOPHAGEAL ECHOCARDIOGRAM;  Surgeon: Delight Ovens, MD;  Location: [facility] OR;  Service: Open Heart Surgery;  Laterality: N/A;  . Clipping of atrial appendage N/A 01/07/2013    Procedure: CLIPPING OF ATRIAL APPENDAGE;  Surgeon: Delight Ovens, MD;  Location: [facility] OR;  Service: Open Heart Surgery;  Laterality: N/A;    History  Smoking status  . Never Smoker   Smokeless tobacco  . Never Used    History  Alcohol Use No    Family History  Problem Relation Age of Onset  .  Family history unknown: Yes    Reviw of Systems:  Reviewed in the HPI.  All other systems are negative.  Physical Exam: Blood pressure 122/60, pulse 86, height 6' (1.829 m), weight 203 lb 8 oz (92.307 kg). General: Well developed, well nourished, in no acute distress.  Head: Normocephalic, atraumatic, sclera non-icteric, mucus membranes are moist,   Neck: Supple. Carotids are 2 + without bruits. No JVD   Lungs: Clear   Heart: His median  sternotomy is healing quite nicely. There is no evidence of infection. His thoracostomy sites are healing well.   Regular rate, normal S1 and S2. He has a loud 3/6 systolic murmur at the upper left sternal border. There is a soft are 1-2/6 systolic murmur in the axilla consistent with mitral regurgitation  Abdomen: Soft, non-tender, non-distended with normal bowel sounds.  Msk:  Strength and tone are normal   Extremities: No clubbing or cyanosis. No edema.  Distal pedal pulses are 2+ and equal    Neuro: CN II - XII intact.  Alert and oriented X 3.   Psych:  Normal   ECG: Normal sinus rhythm. His heart rate is 86 beats per minute. He has a right bundle branch block with left anterior fascicular block. There is left ventricular hypertrophy.  Assessment / Plan:

## 2013-01-29 NOTE — Assessment & Plan Note (Signed)
Cha presents today after hospitalization for a VSD repair and atrial fibrillation.  He's slowly and steadily making progress. His rhythm shows normal sinus rhythm today. He needs to continue the Eliquis for at least 3 months - perhaps longer.   We will decrease the amiodarone to 200 mg a day.  I will see him  3 months. We'll check basic metabolic profile, lipid profile, hepatic profile, and TSH at that time.

## 2013-02-06 ENCOUNTER — Encounter: Payer: Self-pay | Admitting: Cardiothoracic Surgery

## 2013-02-06 ENCOUNTER — Other Ambulatory Visit: Payer: Self-pay | Admitting: *Deleted

## 2013-02-06 DIAGNOSIS — I2510 Atherosclerotic heart disease of native coronary artery without angina pectoris: Secondary | ICD-10-CM

## 2013-02-07 ENCOUNTER — Other Ambulatory Visit: Payer: Self-pay

## 2013-02-07 ENCOUNTER — Other Ambulatory Visit (INDEPENDENT_AMBULATORY_CARE_PROVIDER_SITE_OTHER): Payer: Managed Care, Other (non HMO)

## 2013-02-07 DIAGNOSIS — Z8774 Personal history of (corrected) congenital malformations of heart and circulatory system: Secondary | ICD-10-CM

## 2013-02-07 DIAGNOSIS — R0602 Shortness of breath: Secondary | ICD-10-CM

## 2013-02-07 DIAGNOSIS — R079 Chest pain, unspecified: Secondary | ICD-10-CM

## 2013-02-10 ENCOUNTER — Ambulatory Visit (INDEPENDENT_AMBULATORY_CARE_PROVIDER_SITE_OTHER): Payer: Managed Care, Other (non HMO) | Admitting: Physician Assistant

## 2013-02-10 ENCOUNTER — Ambulatory Visit
Admission: RE | Admit: 2013-02-10 | Discharge: 2013-02-10 | Disposition: A | Discharge status: Home / Self Care | Payer: Managed Care, Other (non HMO) | Source: Ambulatory Visit | Attending: Cardiothoracic Surgery | Admitting: Cardiothoracic Surgery

## 2013-02-10 ENCOUNTER — Ambulatory Visit (HOSPITAL_COMMUNITY): Payer: Medicaid - Dental | Admitting: Dentistry

## 2013-02-10 ENCOUNTER — Encounter (HOSPITAL_COMMUNITY): Payer: Self-pay | Admitting: Dentistry

## 2013-02-10 VITALS — BP 136/72 | HR 79 | Resp 20 | Ht 72.0 in | Wt 203.0 lb

## 2013-02-10 VITALS — BP 156/82 | HR 92 | Temp 98.0°F

## 2013-02-10 DIAGNOSIS — K0820 Unspecified atrophy of edentulous alveolar ridge: Secondary | ICD-10-CM

## 2013-02-10 DIAGNOSIS — K08109 Complete loss of teeth, unspecified cause, unspecified class: Secondary | ICD-10-CM

## 2013-02-10 DIAGNOSIS — Z8679 Personal history of other diseases of the circulatory system: Secondary | ICD-10-CM

## 2013-02-10 DIAGNOSIS — I2510 Atherosclerotic heart disease of native coronary artery without angina pectoris: Secondary | ICD-10-CM

## 2013-02-10 DIAGNOSIS — Z9889 Other specified postprocedural states: Secondary | ICD-10-CM

## 2013-02-10 DIAGNOSIS — Z951 Presence of aortocoronary bypass graft: Secondary | ICD-10-CM

## 2013-02-10 DIAGNOSIS — Z0189 Encounter for other specified special examinations: Secondary | ICD-10-CM

## 2013-02-10 DIAGNOSIS — Z09 Encounter for follow-up examination after completed treatment for conditions other than malignant neoplasm: Secondary | ICD-10-CM

## 2013-02-10 DIAGNOSIS — Q210 Ventricular septal defect: Secondary | ICD-10-CM

## 2013-02-10 NOTE — Progress Notes (Signed)
Periodic oral examination and reevaluation of healing  02/10/2013 Aaron Jones [id number]  VITALS: BP 156/82  Pulse 92  Temp(Src) 98 F (36.7 C) (Oral)  Patient is status post multiple extractions with alveoloplasty and pre-prosthetic surgery as indicated on 12/11/2012. Patient presented for evaluation of healing and suture removal on 02/23/2013. Patient now presents for reevaluation of healing after ventricular septal defect repair surgery with Dr. Tyrone Sage. This procedure was 01/07/2013.  SUBJECTIVE: Patient denies having any significant dental pain or discomfort at this time. Patient denies having had any problems with healing. Patient denies having any persistent sinus problems. Patient denies having any evidence of oral-antral fistula. Patient is not having any problems eating at this time. Patient is interested in proceeding with fabrication of an upper and lower complete denture with Dr. Wilma Flavin in [place], [place].  EXAM: The patient is edentulous. There is no sign of infection or delayed healing. There is no evidence of oral-antral fistula.  There is atrophy of the edentulous alveolar ridges noted.  ASSESSMENT: Post operative course is consistent with dental procedures performed in the OR with maxillary left sinus involvement at time of surgery but no apparent post operative complications. The patient is edentulous.  PLAN: 1. Continue salt water rinses as needed for healing. 2. Follow up with Dr. Wilma Flavin for fabrication of upper and lower complete dentures. Release of information was signed to allow for records to be sent to Dr. Allie Dimmer.  Charlynne Pander, DDS

## 2013-02-10 NOTE — Progress Notes (Signed)
[address]       Aaron Jones [address]             [phone]          HPI: Patient returns for routine postoperative follow-up having undergone CABG x 1/VSD repair/Maze on 01/07/2013 by Dr. Tyrone Sage.  He returned to the office to see Dr. Tyrone Sage on 01/16/2013, and was noted to be in rapid atrial fibrillation/flutter. He was subsequently readmitted to cardiology's service and underwent DCCV on 01/17/2013.  He was converted back to sinus rhythm and was discharged the following day.  Since that time, he has progressed well.  He is eating better and has had no further shortness of breath.  He continues to have a lot of shoulder discomfort, but it is relieved with pain meds.  He was seen on 8/27 by Dr. Elease Hashimoto, and remained in sinus rhythm. At that time, Amiodarone was decreased to 200 mg daily and he was continued on Eliquis.  He had a repeat echo on Friday, but has not yet heard the results.       Current Outpatient Prescriptions  Medication Sig Dispense Refill  . amiodarone (PACERONE) 200 MG tablet Take 1 tablet (200 mg total) by mouth daily.  30 tablet  5  . apixaban (ELIQUIS) 5 MG TABS tablet Take 5 mg by mouth 2 (two) times daily.      Marland Kitchen aspirin 81 MG tablet Take 81 mg by mouth daily with breakfast.      . atorvastatin (LIPITOR) 20 MG tablet Take 20 mg by mouth at bedtime.      . furosemide (LASIX) 40 MG tablet Take 40 mg by mouth daily.       . Glucose Blood (BLOOD GLUCOSE TEST STRIPS) STRP 1 strip by In Vitro route 2 (two) times daily.  100 each  0  . insulin glargine (LANTUS) 100 units/mL SOLN Inject 18 Units into the skin daily at 10 pm.      . metFORMIN (GLUCOPHAGE-XR) 500 MG 24 hr tablet Take 1,000 mg by mouth 2 (two) times daily.      . metoprolol tartrate (LOPRESSOR) 25 MG tablet Take 1 tablet (25 mg total) by mouth 2 (two) times daily.  60 tablet  1  . omeprazole (PRILOSEC) 40 MG capsule Take 40 mg by mouth daily before breakfast.       . oxyCODONE (OXY  IR/ROXICODONE) 5 MG immediate release tablet Take 1-2 tablets (5-10 mg total) by mouth every 3 (three) hours as needed for pain.  30 tablet  0  . potassium chloride SA (K-DUR,KLOR-CON) 20 MEQ tablet Take 20 mEq by mouth daily.       No current facility-administered medications for this visit.     Physical Exam: BP 136/70 HR 79 Resp 20 Wounds: Sternal and right leg EVH sites well healed.  Sternum stable to palpation. Heart: regular rate and rhythm, loud systolic murmur heard best at upper sternal borders Lungs: Clear to auscultation Extremities: No LE edema   Diagnostic Tests: Chest xray: Dg Chest 2 View  02/10/2013   *RADIOLOGY REPORT*  Clinical Data: Recent heart surgery, CABG.  CHEST - 2 VIEW  Comparison: 01/16/2013.  Findings: Trachea is midline.  Heart is enlarged, stable. Sternotomy wires are unchanged in position.  Interval clearing of bibasilar air space disease and bilateral pleural effusions.  No edema.  IMPRESSION: No acute findings.   Original Report Authenticated By: Leanna Battles, M.D.  Assessment/Plan: [name] is progressing well from surgery.  A rhythm strip done in our office today shows him to be in sinus rhythm. He has a follow up scheduled with Dr. Elease Hashimoto for further management of his medications and review of his echo.  From a surgical standpoint, he may begin driving and increase his activity as tolerated.  He is interested in cardiac rehab at [facility], so we will send a referral.  We will see him back on a prn basis.

## 2013-02-10 NOTE — Patient Instructions (Addendum)
PLAN: 1. Continue salt water rinses as needed for healing. 2. Follow up with Dr. Wilma Flavin for fabrication of upper and lower complete dentures. Release of information was signed to allow for records to be sent to Dr. Allie Dimmer.  Dr. Kristin Bruins

## 2013-02-12 ENCOUNTER — Telehealth: Payer: Self-pay

## 2013-02-12 NOTE — Telephone Encounter (Signed)
Pt informed that [facility] cardiac rehab referral was sent and they should be contacting him soon for appt time.

## 2013-02-17 ENCOUNTER — Telehealth: Payer: Self-pay

## 2013-02-17 NOTE — Telephone Encounter (Signed)
Spoke w/ pt.  He is aware of results.  

## 2013-02-17 NOTE — Telephone Encounter (Signed)
Message copied by Marilynne Halsted on Mon Feb 17, 2013  8:37 AM ------      Message from: Antony Odea      Created: Fri Feb 14, 2013 10:10 AM        pt/ will forward      ----- Message -----         From: Vesta Mixer, MD         Sent: 02/13/2013   8:18 PM           To: Antony Odea, RN            There is some residual left to right shunting across the VSD.  Will follow for now.       ------

## 2013-02-26 ENCOUNTER — Telehealth: Payer: Self-pay

## 2013-02-26 NOTE — Telephone Encounter (Signed)
Received call from Brett Canales at [facility] that pt's heart rate was elevated in 150s and pt "felt like he was in afib". Pt symptoms resolved, heart rate decreased while in their office, and he "feels fine now". They are to fax strips to pt's physicians Dr. Elease Hashimoto  & Dr Jens Som in [place] office.

## 2013-02-28 ENCOUNTER — Telehealth: Payer: Self-pay | Admitting: Cardiology

## 2013-03-10 ENCOUNTER — Telehealth: Payer: Self-pay | Admitting: *Deleted

## 2013-03-10 NOTE — Telephone Encounter (Signed)
He had cardiac surgery so any disability letter will need to come from the surgeons.  I do not have him out on disability.

## 2013-03-10 NOTE — Telephone Encounter (Signed)
Patient needs a letter for disability that he is still under doctors care, doing cardiac rehab and the date he will be released and can go back to work. Please call patient when letter is ready. Thanks!

## 2013-03-11 NOTE — Telephone Encounter (Signed)
Spoke w/ pt.  He is aware that he needs to contact his surgeon's office for any paperwork related to him being out of work.  He is agreeable to this.

## 2013-03-11 NOTE — Telephone Encounter (Signed)
Spoke with pt, he would like to stay out of work until he is finished with cardiac rehab. He works for [employer] and his job does at some times require heavy lifting. He has about three weeks left in rehab. Discussed with dr Jens Som, okay given to write a note for pt.

## 2013-03-11 NOTE — Telephone Encounter (Signed)
New Problem  Pt needs a letter head sent to his insurance company for an extension stating that he is still under his Dr.'s care. This to extended the disability benefits... Please call back to discuss.

## 2013-03-11 NOTE — Telephone Encounter (Signed)
Follow up   Returned Aaron Jones's call regarding disability form

## 2013-03-11 NOTE — Telephone Encounter (Signed)
Spoke with pt, he has an appt with rehab tomorrow. He will call and let me know the date of his last appt. Then we can generate a note for his return. Pt agreed with this plan.

## 2013-03-11 NOTE — Telephone Encounter (Signed)
Left message for pt to call, ? Return to work date.

## 2013-03-13 ENCOUNTER — Encounter: Payer: Self-pay | Admitting: *Deleted

## 2013-03-13 ENCOUNTER — Telehealth: Payer: Self-pay | Admitting: Cardiology

## 2013-03-13 NOTE — Telephone Encounter (Signed)
New message   Rehab ends nov 6th.  You wanted to know for return to work clearance

## 2013-03-13 NOTE — Telephone Encounter (Signed)
Spoke with sam, aware pts rehab will be complete on 04-10-13 and he will return to work 04-14-13.

## 2013-03-13 NOTE — Telephone Encounter (Signed)
Update   Sam Disability Rep from [facility];  called for a Status update of pt's CardiRehab  Start to end and date of next office visit.   Please give her a call back.   Thanks!

## 2013-03-13 NOTE — Telephone Encounter (Signed)
Spoke with pt, will mail a letter to his home that he may return to work Monday 04-14-13 with no restrictions. Pt agreed with this plan.

## 2013-04-03 ENCOUNTER — Other Ambulatory Visit: Payer: Self-pay | Admitting: Cardiovascular Disease

## 2013-04-07 ENCOUNTER — Encounter: Payer: Self-pay | Admitting: *Deleted

## 2013-04-10 ENCOUNTER — Other Ambulatory Visit: Payer: Self-pay

## 2013-04-17 ENCOUNTER — Ambulatory Visit (INDEPENDENT_AMBULATORY_CARE_PROVIDER_SITE_OTHER): Payer: Managed Care, Other (non HMO) | Admitting: *Deleted

## 2013-04-17 DIAGNOSIS — Z8774 Personal history of (corrected) congenital malformations of heart and circulatory system: Secondary | ICD-10-CM

## 2013-04-17 DIAGNOSIS — R079 Chest pain, unspecified: Secondary | ICD-10-CM

## 2013-04-17 DIAGNOSIS — R0602 Shortness of breath: Secondary | ICD-10-CM

## 2013-04-17 DIAGNOSIS — Z9889 Other specified postprocedural states: Secondary | ICD-10-CM

## 2013-04-18 LAB — HEPATIC FUNCTION PANEL
ALT: 13 IU/L (ref 0–44)
Alkaline Phosphatase: 78 IU/L (ref 39–117)
Bilirubin, Direct: 0.12 mg/dL (ref 0.00–0.40)
Total Protein: 6.7 g/dL (ref 6.0–8.5)

## 2013-04-18 LAB — LIPID PANEL
Chol/HDL Ratio: 3.5 ratio units (ref 0.0–5.0)
HDL: 41 mg/dL (ref >39)
Triglycerides: 125 mg/dL (ref 0–149)
VLDL Cholesterol Cal: 25 mg/dL (ref 5–40)

## 2013-04-18 LAB — BASIC METABOLIC PANEL
BUN/Creatinine Ratio: 15 (ref 10–22)
BUN: 15 mg/dL (ref 8–27)
CO2: 24 mmol/L (ref 18–29)
Creatinine, Ser: 0.98 mg/dL (ref 0.76–1.27)
GFR calc non Af Amer: 81 mL/min/{1.73_m2} (ref >59)
Sodium: 139 mmol/L (ref 134–144)

## 2013-04-18 LAB — TSH: TSH: 2.01 u[IU]/mL (ref 0.450–4.500)

## 2013-05-07 ENCOUNTER — Ambulatory Visit (INDEPENDENT_AMBULATORY_CARE_PROVIDER_SITE_OTHER): Payer: Managed Care, Other (non HMO) | Admitting: Cardiovascular Disease

## 2013-05-07 ENCOUNTER — Encounter: Payer: Self-pay | Admitting: Cardiovascular Disease

## 2013-05-07 VITALS — BP 142/68 | HR 78 | Ht 71.5 in | Wt 216.2 lb

## 2013-05-07 DIAGNOSIS — I4891 Unspecified atrial fibrillation: Secondary | ICD-10-CM

## 2013-05-07 DIAGNOSIS — I471 Supraventricular tachycardia, unspecified: Secondary | ICD-10-CM

## 2013-05-07 DIAGNOSIS — Q210 Ventricular septal defect: Secondary | ICD-10-CM

## 2013-05-07 NOTE — Assessment & Plan Note (Signed)
He has maintained sinus rhythm. It has been 4 months since his surgery. He had a Maze procedure. We can stop his amiodarone at this point. I seen again in 6 months for office visit, EKG, basic metabolic profile, fasting lipids, and liver enzymes.

## 2013-05-07 NOTE — Patient Instructions (Addendum)
Your physician wants you to follow-up in: 6 months You will receive a reminder letter in the mail two months in advance. If you don't receive a letter, please call our office to schedule the follow-up appointment.  Stop Amiodarone   Your physician recommends that you return for lab work in: 6 months   Lipid Liver  BMP  EKG   Take antibiotic before dental work.

## 2013-05-07 NOTE — Assessment & Plan Note (Signed)
He still has a persistent ventricular septal defect by exam and by echo. The VSD appears to be less since she's had a surgical repair. He feels like better and is able to work and exercise without difficulties.  We'll continue with with the same medications. Given the fact that he still has a persistent ventricular septal defect and has a patch. I would elect to give him SBE prophylaxis prior to having any dental work done.  I will  see him again in 6 months.

## 2013-05-07 NOTE — Progress Notes (Signed)
Aaron Jones Date of Birth  Mar 27, 1946       [facility] [place] Office [address]  [address] [place], [place]  [address]   [place], [place]  [address] [phone]     [phone]   Fax  [phone]    Fax [phone]  Problem List: 1. Atrial fibrillation- s/p  2. Hypertension 3. Hyperlipidemia 4. Mitral valve prolapse 5. Diabetes mellitus 6. Ventricular septal defect  - s/p closure 7. CABG - SVG to RCA   History of Present Illness:  Aaron Jones is a 67 yo with the above noted hx.  he has had a heart murmur all of his life presumably due to this ventricular septal defect. He was admitted to [facility] for congestive heart failure, rapid atrial fibrillation and was found to have a ventricular septal defect. [facility] was transferred [facility]. He was seen by the surgical team and eventually had closure of his perimembranous VSD by Dr. Tyrone Sage.  He also had saphenous face and saphenous vein grafting to his right coronary artery at that time. He also had a right and left atrial Maze procedure as well as left atrial clipping.  He had recurrent atrial fibrillation and had an cardioversion.  He's been feeling well since that time. He has been ambulating without too much difficulty.    He does have some right shoulder pain since the hospitalization.  He is asymptomatic from an A-Fib standpoint.  He cannot tell if his rhythm is regular or not.   Dec. 3, 2014:  Aaron Jones is doing well.   No CP or dyspnea.  He can no longer here his VSD murmur. During her last visit, there is  evidence of a persistent VSD. A followup echocardiogram revealed an intact patch but with a persistent  perimembranous VSD but was still a moderate left right shunt.  The patient family states that the murmur is a lot better- he used to be able to hear  it at night when he tried to go to sleep.  He still works Furniture conservator/restorer)    Current Outpatient Prescriptions on File Prior to Visit  Medication Sig Dispense Refill  . amiodarone (PACERONE) 200 MG tablet Take 1 tablet (200 mg total) by mouth daily.  30 tablet  5  . apixaban (ELIQUIS) 5 MG TABS tablet Take 5 mg by mouth 2 (two) times daily.      Marland Kitchen aspirin 81 MG tablet Take 81 mg by mouth daily with breakfast.      . atorvastatin (LIPITOR) 20 MG tablet Take 20 mg by mouth at bedtime.      . furosemide (LASIX) 40 MG tablet Take 40 mg by mouth daily.       . Glucose Blood (BLOOD GLUCOSE TEST STRIPS) STRP 1 strip by In Vitro route 2 (two) times daily.  100 each  0  . insulin glargine (LANTUS) 100 units/mL SOLN Inject 18 Units into the skin daily at 10 pm.      . metFORMIN (GLUCOPHAGE-XR) 500 MG 24 hr tablet Take 1,000 mg by mouth 2 (two) times daily.      . metoprolol tartrate (LOPRESSOR) 25 MG tablet TAKE 1 TABLET BY MOUTH TWICE DAILY  60 tablet  1  . omeprazole (PRILOSEC) 40 MG capsule Take 40 mg by mouth daily before breakfast.       . oxyCODONE (OXY IR/ROXICODONE) 5 MG immediate release tablet Take  1-2 tablets (5-10 mg total) by mouth every 3 (three) hours as needed for pain.  30 tablet  0  . potassium chloride SA (K-DUR,KLOR-CON) 20 MEQ tablet Take 20 mEq by mouth daily.       No current facility-administered medications on file prior to visit.    Allergies  Allergen Reactions  . Biaxin [Clarithromycin] Nausea Only    Past Medical History  Diagnosis Date  . GERD (gastroesophageal reflux disease)   . Hyperlipidemia   . VSD (ventricular septal defect)     a. 01/2013 s/p VSD closure (dacron patch)  . Hypertension     PCP- Alena Bills, phone; [phone]  . Coronary artery disease     a. 01/2013 s/p CABG x 1 (VG->PDA) in setting of VSD repair.  . Sleep apnea     [facility]- in process of being evaluated now, states 12 yrs. ago was on CPAP but after having his deviated septum repaired, he had put the CPAP in storage. Pt. will get a new machine soon.   . Type  II diabetes mellitus   . Umbilical hernia     "not repaired" (January 29, 2013)  . Kidney stones     "6-7 times; they passed on their own" (01-29-13)  . Tachycardia, paroxysmal May 2013    Possible SVT  . Atrial fibrillation with rapid ventricular response 29-Jan-2013    a. 01/2013 s/p R & L Maze and LAA clipping in setting of VSD repair;  b. On amiodarone/Apixaban;  c. 01/2013 Recurrent afib requiring DCCV.    Past Surgical History  Procedure Laterality Date  . Nasal septum surgery  2004  . Tee without cardioversion N/A 11/21/2012    Procedure: TRANSESOPHAGEAL ECHOCARDIOGRAM (TEE);  Surgeon: Dolores Patty, MD;  Location: [facility] ENDOSCOPY;  Service: Cardiovascular;  Laterality: N/A;  . Cardiac catheterization  > 5 yr ago    Done at [facility], reportedly clean  . Cardiac catheterization  11/2012  . Multiple extractions with alveoloplasty N/A 12/11/2012    Procedure: Extraction of tooth #'s 2,3,4,5,14,15,17,21,22,23,24,25,26,27,32 wioth alveoloplasty and bialteral fibrous tuberosity reductions.;  Surgeon: Charlynne Pander, DDS;  Location: [facility] ORS;  Service: Oral Surgery;  Laterality: N/A;  . Eye surgery Left 1990's    "clipped muscle so it wouldn't be pulling up" (01/29/13)  . Coronary artery bypass graft N/A 01/07/2013    Procedure: CORONARY ARTERY BYPASS GRAFTING (CABG);  Surgeon: Delight Ovens, MD;  Location: [facility] OR;  Service: Open Heart Surgery;  Laterality: N/A;  Times1 using endoscopically harvested saphenous vein graft to the PDA  . Maze N/A 01/07/2013    Procedure: MAZE;  Surgeon: Delight Ovens, MD;  Location: [facility] OR;  Service: Open Heart Surgery;  Laterality: N/A;  . Vsd repair N/A 01/07/2013    Procedure: VENTRICULAR SEPTAL DEFECT (VSD) REPAIR;  Surgeon: Delight Ovens, MD;  Location: [facility] OR;  Service: Open Heart Surgery;  Laterality: N/A;  . Intraoperative transesophageal echocardiogram N/A 01/07/2013    Procedure: INTRAOPERATIVE TRANSESOPHAGEAL ECHOCARDIOGRAM;  Surgeon: Delight Ovens,  MD;  Location: [facility] OR;  Service: Open Heart Surgery;  Laterality: N/A;  . Clipping of atrial appendage N/A 01/07/2013    Procedure: CLIPPING OF ATRIAL APPENDAGE;  Surgeon: Delight Ovens, MD;  Location: [facility] OR;  Service: Open Heart Surgery;  Laterality: N/A;    History  Smoking status  . Never Smoker   Smokeless tobacco  . Never Used    History  Alcohol Use No    Family History  Problem  Relation Age of Onset  . Family history unknown: Yes    Reviw of Systems:  Reviewed in the HPI.  All other systems are negative.  Physical Exam: Blood pressure 142/68, pulse 78, height 5' 11.5" (1.816 m), weight 216 lb 4 oz (98.09 kg). General: Well developed, well nourished, in no acute distress.  Head: Normocephalic, atraumatic, sclera non-icteric, mucus membranes are moist,   Neck: Supple. Carotids are 2 + without bruits. No JVD   Lungs: Clear   Heart: His median sternotomy is healing quite nicely. There is no evidence of infection. His thoracostomy sites are healing well.   Regular rate, normal S1 and S2. He has a loud 3/6 systolic murmur at the upper left sternal border. There is a soft are 1-2/6 systolic murmur in the axilla consistent with mitral regurgitation  Abdomen: Soft, non-tender, non-distended with normal bowel sounds.  Msk:  Strength and tone are normal   Extremities: No clubbing or cyanosis. No edema.  Distal pedal pulses are 2+ and equal    Neuro: CN II - XII intact.  Alert and oriented X 3.   Psych:  Normal   ECG: Dec. 3, 2014:   Normal sinus rhythm. His heart rate is 78 beats per minute. He has a right bundle branch block with left anterior fascicular block. There is left ventricular hypertrophy.  Assessment / Plan:

## 2013-05-30 ENCOUNTER — Other Ambulatory Visit: Payer: Self-pay | Admitting: Cardiovascular Disease

## 2013-11-12 DIAGNOSIS — G4730 Sleep apnea, unspecified: Secondary | ICD-10-CM | POA: Insufficient documentation

## 2013-11-17 ENCOUNTER — Other Ambulatory Visit: Payer: Self-pay | Admitting: Cardiovascular Disease

## 2013-12-29 ENCOUNTER — Other Ambulatory Visit: Payer: Self-pay | Admitting: Physician Assistant

## 2014-02-04 ENCOUNTER — Ambulatory Visit (INDEPENDENT_AMBULATORY_CARE_PROVIDER_SITE_OTHER): Payer: Managed Care, Other (non HMO) | Admitting: Family Medicine

## 2014-02-04 VITALS — BP 162/76 | HR 90 | Temp 97.6°F | Resp 18 | Ht 71.75 in | Wt 217.8 lb

## 2014-02-04 DIAGNOSIS — R040 Epistaxis: Secondary | ICD-10-CM

## 2014-02-04 DIAGNOSIS — Z7901 Long term (current) use of anticoagulants: Secondary | ICD-10-CM

## 2014-02-04 NOTE — Patient Instructions (Signed)

## 2014-02-04 NOTE — Progress Notes (Signed)
Chief Complaint:  Chief Complaint  Patient presents with  . Epistaxis    X Today    HPI: Aaron Jones is a 68 y.o. male who is here for  Nose bleed on the left nostril, he is on Eliquis gor CABG and also Afib. He states the nose bleed started at 4 pm and he thinks he was able to stop it but wants to make sure, he has put a gauze in his nose and the blood has not soaked through. He denies CP, dizziness or palpitations. He is doing well. Overall except these occ nose bleeds.   Past Medical History  Diagnosis Date  . GERD (gastroesophageal reflux disease)   . Hyperlipidemia   . VSD (ventricular septal defect)     a. 01/2013 s/p VSD closure (dacron patch)  . Hypertension     PCP- Kathlynn Grate, phone; [phone]  . Coronary artery disease     a. 01/2013 s/p CABG x 1 (VG->PDA) in setting of VSD repair.  . Sleep apnea     [facility]- in process of being evaluated now, states 12 yrs. ago was on CPAP but after having his deviated septum repaired, he had put the CPAP in storage. Pt. will get a new machine soon.   . Type II diabetes mellitus   . Umbilical hernia     "not repaired" (01-28-13)  . Kidney stones     "6-7 times; they passed on their own" (01/28/2013)  . Tachycardia, paroxysmal May 2013    Possible SVT  . Atrial fibrillation with rapid ventricular response 01/28/2013    a. 01/2013 s/p R & L Maze and LAA clipping in setting of VSD repair;  b. On amiodarone/Apixaban;  c. 01/2013 Recurrent afib requiring DCCV.  Marland Kitchen COPD (chronic obstructive pulmonary disease)    Past Surgical History  Procedure Laterality Date  . Nasal septum surgery  2004  . Tee without cardioversion N/A 11/21/2012    Procedure: TRANSESOPHAGEAL ECHOCARDIOGRAM (TEE);  Surgeon: Jolaine Artist, MD;  Location: [facility] ENDOSCOPY;  Service: Cardiovascular;  Laterality: N/A;  . Cardiac catheterization  > 5 yr ago    Done at [facility], reportedly clean  . Cardiac catheterization  11/2012  . Multiple extractions  with alveoloplasty N/A 12/11/2012    Procedure: Extraction of tooth #'s 5,3,2,9,92,42,68,34,19,62,22,97,98,92,11 wioth alveoloplasty and bialteral fibrous tuberosity reductions.;  Surgeon: Lenn Cal, DDS;  Location: [facility] ORS;  Service: Oral Surgery;  Laterality: N/A;  . Eye surgery Left 1990's    "clipped muscle so it wouldn't be pulling up" (2013-01-28)  . Coronary artery bypass graft N/A 01/07/2013    Procedure: CORONARY ARTERY BYPASS GRAFTING (CABG);  Surgeon: Grace Isaac, MD;  Location: [place];  Service: Open Heart Surgery;  Laterality: N/A;  Times1 using endoscopically harvested saphenous vein graft to the PDA  . Maze N/A 01/07/2013    Procedure: MAZE;  Surgeon: Grace Isaac, MD;  Location: [place];  Service: Open Heart Surgery;  Laterality: N/A;  . Vsd repair N/A 01/07/2013    Procedure: VENTRICULAR SEPTAL DEFECT (VSD) REPAIR;  Surgeon: Grace Isaac, MD;  Location: [place];  Service: Open Heart Surgery;  Laterality: N/A;  . Intraoperative transesophageal echocardiogram N/A 01/07/2013    Procedure: INTRAOPERATIVE TRANSESOPHAGEAL ECHOCARDIOGRAM;  Surgeon: Grace Isaac, MD;  Location: [place];  Service: Open Heart Surgery;  Laterality: N/A;  . Clipping of atrial appendage N/A 01/07/2013    Procedure: CLIPPING OF ATRIAL APPENDAGE;  Surgeon: Percell Miller  Maryruth Bun, MD;  Location: [place];  Service: Open Heart Surgery;  Laterality: N/A;   History   Social History  . Marital Status: Married    Spouse Name: N/A    Number of Children: N/A  . 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 Smoking status: Never Smoker   . Smokeless tobacco: Never Used  . Alcohol Use: No  . Drug Use: No  . Sexual Activity: Not Currently   Other Topics Concern  . None   Social History Narrative   Still works full time. Works around the yard. Lives with wife. Has 2 sisters, neither with cardiac issues.   Family History  Problem  Relation Age of Onset  . Cancer Mother   . Diabetes Mother    Allergies  Allergen Reactions  . Biaxin [Clarithromycin] Nausea Only   Prior to Admission medications   Medication Sig Start Date End Date Taking? Authorizing Provider  aspirin 81 MG tablet Take 81 mg by mouth daily with breakfast.   Yes Historical Provider, MD  atorvastatin (LIPITOR) 20 MG tablet Take 20 mg by mouth at bedtime.   Yes Historical Provider, MD  ELIQUIS 5 MG TABS tablet TAKE 1 TABLET BY MOUTH TWICE DAILY   Yes Thayer Headings, MD  furosemide (LASIX) 40 MG tablet TAKE 1 TABLET BY MOUTH EVERY DAY   Yes Thayer Headings, MD  Glucose Blood (BLOOD GLUCOSE TEST STRIPS) STRP 1 strip by In Vitro route 2 (two) times daily. 11/25/12  Yes Lelon Perla, MD  insulin glargine (LANTUS) 100 units/mL SOLN Inject 18 Units into the skin daily at 10 pm. 11/25/12  Yes Rhonda G Barrett, PA-C  metFORMIN (GLUCOPHAGE-XR) 500 MG 24 hr tablet Take 1,000 mg by mouth 2 (two) times daily.   Yes Historical Provider, MD  metoprolol tartrate (LOPRESSOR) 25 MG tablet TAKE 1 TABLET BY MOUTH TWICE DAILY 11/17/13  Yes Minna Merritts, MD  omeprazole (PRILOSEC) 40 MG capsule Take 40 mg by mouth daily before breakfast.    Yes Historical Provider, MD  potassium chloride SA (K-DUR,KLOR-CON) 20 MEQ tablet TAKE 1 TABLET BY MOUTH EVERY DAY   Yes Thayer Headings, MD  oxyCODONE (OXY IR/ROXICODONE) 5 MG immediate release tablet Take 1-2 tablets (5-10 mg total) by mouth every 3 (three) hours as needed for pain. 01/12/13   Coolidge Breeze, PA-C     ROS: The patient denies fevers, chills, night sweats, unintentional weight loss, chest pain, palpitations, wheezing, dyspnea on exertion, nausea, vomiting, abdominal pain, dysuria, hematuria, melena, numbness, weakness, or tingling.   All other systems have been reviewed and were otherwise negative with the exception of those mentioned in the HPI and as above.    PHYSICAL EXAM: Filed Vitals:   02/04/14 1658  BP:  162/76  Pulse: 90  Temp: 97.6 F (36.4 C)  Resp: 18   Filed Vitals:   02/04/14 1658  Height: 5' 11.75" (1.822 m)  Weight: 217 lb 12.8 oz (98.793 kg)   Body mass index is 29.76 kg/(m^2).  General: Alert, no acute distress HEENT:  Normocephalic, atraumatic, oropharynx patent. EOMI, PERRLA. + nose bleed, no clots in OP Cardiovascular:  Regular rate and rhythm, + 3/6 systolicmurmurs .  No Carotid bruits, radial pulse intact. No pedal edema.  Respiratory: Clear to auscultation bilaterally.  No wheezes, rales, or rhonchi.  No cyanosis, no use of accessory musculature  GI: No organomegaly, abdomen is soft and non-tender, positive bowel sounds.  No masses. Skin: No rashes. Neurologic: Facial musculature symmetric. Psychiatric: Patient is appropriate throughout our interaction. Lymphatic: No cervical lymphadenopathy Musculoskeletal: Gait intact.   LABS: Results for orders placed in visit on 44/92/01  BASIC METABOLIC PANEL      Result Value Ref Range   Glucose 165 (*) 65 - 99 mg/dL   BUN 15  8 - 27 mg/dL   Creatinine, Ser 0.98  0.76 - 1.27 mg/dL   GFR calc non Af Amer 81  >59 mL/min/1.73   GFR calc Af Amer 93  >59 mL/min/1.73   BUN/Creatinine Ratio 15  10 - 22   Sodium 139  134 - 144 mmol/L   Potassium 4.9  3.5 - 5.2 mmol/L   Chloride 101  97 - 108 mmol/L   CO2 24  18 - 29 mmol/L   Calcium 8.8  8.6 - 10.2 mg/dL  LIPID PANEL      Result Value Ref Range   Cholesterol, Total 142  100 - 199 mg/dL   Triglycerides 125  0 - 149 mg/dL   HDL 41  >39 mg/dL   VLDL Cholesterol Cal 25  5 - 40 mg/dL   LDL Calculated 76  0 - 99 mg/dL   Chol/HDL Ratio 3.5  0.0 - 5.0 ratio units  HEPATIC FUNCTION PANEL      Result Value Ref Range   Total Protein 6.7  6.0 - 8.5 g/dL   Albumin 3.9  3.6 - 4.8 g/dL   Total Bilirubin 0.3  0.0 - 1.2 mg/dL   Bilirubin, Direct 0.12  0.00 - 0.40 mg/dL   Alkaline Phosphatase 78  39 - 117 IU/L   AST 20  0 - 40 IU/L   ALT 13  0 - 44 IU/L  TSH      Result Value Ref  Range   TSH 2.010  0.450 - 4.500 uIU/mL     EKG/XRAY:   Primary read interpreted by Dr. Marin Comment at [facility].   ASSESSMENT/PLAN: Encounter Diagnoses  Name Primary?  . Epistaxis Yes  . Chronic anticoagulation    After VCO, Cauterize bleeding vessel on left nare Patient tolerated procedure , no active bleeding witessed even after 5-10 min after procedure was completed.  F/u prn  Gross sideeffects, risk and benefits, and alternatives of medications d/w patient. Patient is aware that all medications have potential sideeffects and we are unable to predict every sideeffect or drug-drug interaction that may occur.  Aubreana Cornacchia, [place], DO 02/05/2014 5:28 PM

## 2014-02-05 ENCOUNTER — Ambulatory Visit (INDEPENDENT_AMBULATORY_CARE_PROVIDER_SITE_OTHER): Payer: Managed Care, Other (non HMO) | Admitting: Family Medicine

## 2014-02-05 VITALS — BP 134/62 | HR 90 | Temp 98.4°F | Resp 18

## 2014-02-05 DIAGNOSIS — T171XXA Foreign body in nostril, initial encounter: Secondary | ICD-10-CM

## 2014-02-05 DIAGNOSIS — D699 Hemorrhagic condition, unspecified: Secondary | ICD-10-CM

## 2014-02-05 DIAGNOSIS — R040 Epistaxis: Secondary | ICD-10-CM

## 2014-02-05 LAB — POCT CBC
Granulocyte percent: 67.3 %G (ref 37–80)
HCT, POC: 41.4 % — AB (ref 43.5–53.7)
HEMOGLOBIN: 13.3 g/dL — AB (ref 14.1–18.1)
LYMPH, POC: 2.1 (ref 0.6–3.4)
MCH, POC: 29.3 pg (ref 27–31.2)
MCHC: 32.2 g/dL (ref 31.8–35.4)
MCV: 91 fL (ref 80–97)
MID (CBC): 0.2 (ref 0–0.9)
MPV: 7.8 fL (ref 0–99.8)
PLATELET COUNT, POC: 258 10*3/uL (ref 142–424)
POC GRANULOCYTE: 4.9 (ref 2–6.9)
POC LYMPH PERCENT: 29.4 %L (ref 10–50)
POC MID %: 3.3 % (ref 0–12)
RBC: 4.55 M/uL — AB (ref 4.69–6.13)
RDW, POC: 12.8 %
WBC: 7.3 10*3/uL (ref 4.6–10.2)

## 2014-02-05 MED ORDER — CEPHALEXIN 500 MG PO CAPS: 500.0000 mg | ORAL_CAPSULE | Freq: Four times a day (QID) | ORAL | Status: DC

## 2014-02-05 NOTE — Patient Instructions (Signed)
Due to recurrent bleeding and need to remain on blood thinners, we will leave the packing in place for 5 days as recommended by Ear Nose and Throat specialist. Start the antibiotic to lessen chance of infection from this in the nose. Return to the clinic or go to the nearest emergency room if any of your symptoms worsen or new symptoms occur. Nosebleed Nosebleeds can be caused by many conditions, including trauma, infections, polyps, foreign bodies, dry mucous membranes or climate, medicines, and air conditioning. Most nosebleeds occur in the front of the nose. Because of this location, most nosebleeds can be controlled by pinching the nostrils gently and continuously for at least 10 to 20 minutes. The long, continuous pressure allows enough time for the blood to clot. If pressure is released during that 10 to 20 minute time period, the process may have to be started again. The nosebleed may stop by itself or quit with pressure, or it may need concentrated heating (cautery) or pressure from packing. HOME CARE INSTRUCTIONS   If your nose was packed, try to maintain the pack inside until your health care provider removes it. If a gauze pack was used and it starts to fall out, gently replace it or cut the end off. Do not cut if a balloon catheter was used to pack the nose. Otherwise, do not remove unless instructed.  Avoid blowing your nose for 12 hours after treatment. This could dislodge the pack or clot and start the bleeding again.  If the bleeding starts again, sit up and bend forward, gently pinching the front half of your nose continuously for 20 minutes.  If bleeding was caused by dry mucous membranes, use over-the-counter saline nasal spray or gel. This will keep the mucous membranes moist and allow them to heal. If you must use a lubricant, choose the water-soluble variety. Use it only sparingly and not within several hours of lying down.  Do not use petroleum jelly or mineral oil, as these may  drip into the lungs and cause serious problems.  Maintain humidity in your home by using less air conditioning or by using a humidifier.  Do not use aspirin or medicines which make bleeding more likely. Your health care provider can give you recommendations on this.  Resume normal activities as you are able, but try to avoid straining, lifting, or bending at the waist for several days.  If the nosebleeds become recurrent and the cause is unknown, your health care provider may suggest laboratory tests. SEEK MEDICAL CARE IF: You have a fever. SEEK IMMEDIATE MEDICAL CARE IF:   Bleeding recurs and cannot be controlled.  There is unusual bleeding from or bruising on other parts of the body.  Nosebleeds continue.  There is any worsening of the condition which originally brought you in.  You become light-headed, feel faint, become sweaty, or vomit blood. MAKE SURE YOU:   Understand these instructions.  Will watch your condition.  Will get help right away if you are not doing well or get worse. Document Released: 03/01/2005 Document Revised: 10/06/2013 Document Reviewed: 04/22/2009 [facility] Patient Information 2015 [place], [place]. This information is not intended to replace advice given to you by your health care provider. Make sure you discuss any questions you have with your health care provider.

## 2014-02-05 NOTE — Progress Notes (Addendum)
Subjective:    Patient ID: Aaron Jones, male    DOB: [birth year], 68 y.o.   MRN: [id number]  This chart was scribed for Corliss Parish, MD by Edison Simon, ED Scribe. This patient was seen in room 6 and the patient's care was started at 1:15 PM.   Chief Complaint  Patient presents with  . Epistaxis    saw dr. Marin Comment yesturday for same thing. Reports it started again approx 1 hr ago.   HPI  HPI Comments: Aaron Jones is a 68 y.o. male who presents to the Emergency Department with complaints of epistaxis. He is on ASA and Eliquis with history of atrial fibrillation, CABG, and VSD. History of nasal septum surgery in 2004. He states he was here yesterday  at which time Dr. Marin Comment cauterized a blood vessel. He states that epistaxis started at 4:00pm yesterday and he tried to treat with gauze without remission of symptoms. He states symptoms remissed after cauterization. He states epistaxis began again today while eating. He states he rolled up toilet paper and stuck it in his nostrils and applied pressure. He states he continued eating then later removed the toilet paper and found that the bleeding still continued. He denies dizziness or lightheadedness.    Patient Active Problem List   Diagnosis Date Noted  . Paroxysmal supraventricular tachycardia 01/10/2013  . Acute diastolic heart failure 56/25/6389  . Hypokalemia 11/18/2012  . Edema of left lower extremity 11/18/2012  . Mild difuse Hypokinesis 11/18/2012  . Ventricular septal defect (VSD), membranous 11/18/2012  . Mild Pulmonary HTN 11/18/2012  . Atrial fibrillation with rapid ventricular response 11/17/2012  . Essential hypertension, benign 11/17/2012  . Type II or unspecified type diabetes mellitus without mention of complication, not stated as uncontrolled 11/17/2012  . Dyslipidemia 11/17/2012  . Heart murmur 11/17/2012   Past Medical History  Diagnosis Date  . GERD (gastroesophageal reflux disease)   . Hyperlipidemia   . VSD  (ventricular septal defect)     a. 01/2013 s/p VSD closure (dacron patch)  . Hypertension     PCP- Kathlynn Grate, phone; [phone]  . Coronary artery disease     a. 01/2013 s/p CABG x 1 (VG->PDA) in setting of VSD repair.  . Sleep apnea     [facility]- in process of being evaluated now, states 12 yrs. ago was on CPAP but after having his deviated septum repaired, he had put the CPAP in storage. Pt. will get a new machine soon.   . Type II diabetes mellitus   . Umbilical hernia     "not repaired" (Feb 02, 2013)  . Kidney stones     "6-7 times; they passed on their own" (02-Feb-2013)  . Tachycardia, paroxysmal May 2013    Possible SVT  . Atrial fibrillation with rapid ventricular response 02/02/2013    a. 01/2013 s/p R & L Maze and LAA clipping in setting of VSD repair;  b. On amiodarone/Apixaban;  c. 01/2013 Recurrent afib requiring DCCV.  Marland Kitchen COPD (chronic obstructive pulmonary disease)    Past Surgical History  Procedure Laterality Date  . Nasal septum surgery  2004  . Tee without cardioversion N/A 11/21/2012    Procedure: TRANSESOPHAGEAL ECHOCARDIOGRAM (TEE);  Surgeon: Jolaine Artist, MD;  Location: [facility] ENDOSCOPY;  Service: Cardiovascular;  Laterality: N/A;  . Cardiac catheterization  > 5 yr ago    Done at [facility], reportedly clean  . Cardiac catheterization  11/2012  . Multiple extractions with alveoloplasty N/A 12/11/2012  Procedure: Extraction of tooth #'s 2,9,7,9,89,21,19,41,74,08,14,48,18,56,31 wioth alveoloplasty and bialteral fibrous tuberosity reductions.;  Surgeon: Lenn Cal, DDS;  Location: [facility];  Service: Oral Surgery;  Laterality: N/A;  . Eye surgery Left 1990's    "clipped muscle so it wouldn't be pulling up" (01/16/2013)  . Coronary artery bypass graft N/A 01/07/2013    Procedure: CORONARY ARTERY BYPASS GRAFTING (CABG);  Surgeon: Grace Isaac, MD;  Location: [place];  Service: Open Heart Surgery;  Laterality: N/A;  Times1 using endoscopically harvested saphenous  vein graft to the PDA  . Maze N/A 01/07/2013    Procedure: MAZE;  Surgeon: Grace Isaac, MD;  Location: [place];  Service: Open Heart Surgery;  Laterality: N/A;  . Vsd repair N/A 01/07/2013    Procedure: VENTRICULAR SEPTAL DEFECT (VSD) REPAIR;  Surgeon: Grace Isaac, MD;  Location: [place];  Service: Open Heart Surgery;  Laterality: N/A;  . Intraoperative transesophageal echocardiogram N/A 01/07/2013    Procedure: INTRAOPERATIVE TRANSESOPHAGEAL ECHOCARDIOGRAM;  Surgeon: Grace Isaac, MD;  Location: [place];  Service: Open Heart Surgery;  Laterality: N/A;  . Clipping of atrial appendage N/A 01/07/2013    Procedure: CLIPPING OF ATRIAL APPENDAGE;  Surgeon: Grace Isaac, MD;  Location: [place];  Service: Open Heart Surgery;  Laterality: N/A;   Allergies  Allergen Reactions  . Biaxin [Clarithromycin] Nausea Only   Prior to Admission medications   Medication Sig Start Date End Date Taking? Authorizing Provider  aspirin 81 MG tablet Take 81 mg by mouth daily with breakfast.   Yes Historical Provider, MD  atorvastatin (LIPITOR) 20 MG tablet Take 20 mg by mouth at bedtime.   Yes Historical Provider, MD  ELIQUIS 5 MG TABS tablet TAKE 1 TABLET BY MOUTH TWICE DAILY   Yes Thayer Headings, MD  furosemide (LASIX) 40 MG tablet TAKE 1 TABLET BY MOUTH EVERY DAY   Yes Thayer Headings, MD  Glucose Blood (BLOOD GLUCOSE TEST STRIPS) STRP 1 strip by In Vitro route 2 (two) times daily. 11/25/12  Yes Lelon Perla, MD  insulin glargine (LANTUS) 100 units/mL SOLN Inject 18 Units into the skin daily at 10 pm. 11/25/12  Yes Rhonda G Barrett, PA-C  metFORMIN (GLUCOPHAGE-XR) 500 MG 24 hr tablet Take 1,000 mg by mouth 2 (two) times daily.   Yes Historical Provider, MD  metoprolol tartrate (LOPRESSOR) 25 MG tablet TAKE 1 TABLET BY MOUTH TWICE DAILY 11/17/13  Yes Minna Merritts, MD  omeprazole (PRILOSEC) 40 MG capsule Take 40 mg by mouth daily before breakfast.    Yes Historical Provider, MD  oxyCODONE (OXY  IR/ROXICODONE) 5 MG immediate release tablet Take 1-2 tablets (5-10 mg total) by mouth every 3 (three) hours as needed for pain. 01/12/13  Yes Coolidge Breeze, PA-C  potassium chloride SA (K-DUR,KLOR-CON) 20 MEQ tablet TAKE 1 TABLET BY MOUTH EVERY DAY   Yes Thayer Headings, MD   History   Social History  . Marital Status: Married    Spouse Name: N/A    Number of Children: N/A  . 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 Smoking status: Never Smoker   . Smokeless tobacco: Never Used  . Alcohol Use: No  . Drug Use: No  . Sexual Activity: Not Currently   Other Topics Concern  . Not on file   Social History Narrative   Still works full time.  Works around the yard. Lives with wife. Has 2 sisters, neither with cardiac issues.   Review of Systems  HENT: Positive for nosebleeds.   Neurological: Negative for dizziness and light-headedness.       Objective:   Physical Exam  Constitutional: He is oriented to person, place, and time. He appears well-developed and well-nourished.  HENT:  Head: Normocephalic and atraumatic.  Right nare clear with no signs of bleeding, moist oral mucosa, no eblood in posterior oropharynx, gauze was removed from left nare, some clots seen at end of left nare with some continued small oozing of blood from left nare which appears to coming from septum but also some posterior blood is seen, he states blood in going down his throat  Eyes: Conjunctivae are normal.  Neck: Normal range of motion. Neck supple.  Pulmonary/Chest: Effort normal.  Musculoskeletal: Normal range of motion.  Neurological: He is alert and oriented to person, place, and time.  Skin: Skin is warm and dry.  Psychiatric: He has a normal mood and affect.    Filed Vitals:   02/05/14 1256  BP: 158/74  Temp: 98.4 F (36.9 C)  TempSrc: Oral  Resp: 18    Risks, benefits, and alternatives for controlling  epistaxis from left nose discussed, use of Rapid Rhino 7.5 cm for attempt at packing and control of epistaxis, consent obtained.  Placed without difficulty, approx 6 cc air placed with firm pressure and cessation of bleeding. No complications. Small amount of oozing few minutes after placement, but light red.   Results for orders placed in visit on 02/05/14  POCT CBC      Result Value Ref Range   WBC 7.3  4.6 - 10.2 K/uL   Lymph, poc 2.1  0.6 - 3.4   POC LYMPH PERCENT 29.4  10 - 50 %L   MID (cbc) 0.2  0 - 0.9   POC MID % 3.3  0 - 12 %M   POC Granulocyte 4.9  2 - 6.9   Granulocyte percent 67.3  37 - 80 %G   RBC 4.55 (*) 4.69 - 6.13 M/uL   Hemoglobin 13.3 (*) 14.1 - 18.1 g/dL   HCT, POC 41.4 (*) 43.5 - 53.7 %   MCV 91.0  80 - 97 fL   MCH, POC 29.3  27 - 31.2 pg   MCHC 32.2  31.8 - 35.4 g/dL   RDW, POC 12.8     Platelet Count, POC 258  142 - 424 K/uL   MPV 7.8  0 - 99.8 fL   2:10 PM: repeat exam: small amount of oozing initially has stopped. Posterior OP clear - no bleeding. Pilot cuff with minimal firmness - additional 1cc air applied (total approx 6-7cc). Tolerated without difficulty -feels pressure, no pain.     Assessment & Plan:   Aaron Jones is a 68 y.o. male Epistaxis - Plan: POCT CBC, Ambulatory referral to ENT, cephALEXin (KEFLEX) 500 MG capsule  Bleeding tendency - Plan: Ambulatory referral to ENT  Recurrent epistaxis - Plan: Ambulatory referral to ENT  Foreign body in nose, initial encounter - Plan: Ambulatory referral to ENT, cephALEXin (KEFLEX) 500 MG capsule Packed with Rapid rhino - no complications. Cessation of bleeding. Due to underlying bleeding tendency on chronic anticoagulation - discussed with ENT - advised to leave packed 5 days, cover with Keflex to lessen chance of infection.  ER/RTC precautions discussed. HGB stable (improved from prior reading).   Meds ordered this encounter  Medications  . cephALEXin (KEFLEX)  500 MG capsule    Sig: Take 1 capsule  (500 mg total) by mouth 4 (four) times daily.    Dispense:  20 capsule    Refill:  0   Patient Instructions  Due to recurrent bleeding and need to remain on blood thinners, we will leave the packing in place for 5 days as recommended by Ear Nose and Throat specialist. Start the antibiotic to lessen chance of infection from this in the nose. Return to the clinic or go to the nearest emergency room if any of your symptoms worsen or new symptoms occur. Nosebleed Nosebleeds can be caused by many conditions, including trauma, infections, polyps, foreign bodies, dry mucous membranes or climate, medicines, and air conditioning. Most nosebleeds occur in the front of the nose. Because of this location, most nosebleeds can be controlled by pinching the nostrils gently and continuously for at least 10 to 20 minutes. The long, continuous pressure allows enough time for the blood to clot. If pressure is released during that 10 to 20 minute time period, the process may have to be started again. The nosebleed may stop by itself or quit with pressure, or it may need concentrated heating (cautery) or pressure from packing. HOME CARE INSTRUCTIONS   If your nose was packed, try to maintain the pack inside until your health care provider removes it. If a gauze pack was used and it starts to fall out, gently replace it or cut the end off. Do not cut if a balloon catheter was used to pack the nose. Otherwise, do not remove unless instructed.  Avoid blowing your nose for 12 hours after treatment. This could dislodge the pack or clot and start the bleeding again.  If the bleeding starts again, sit up and bend forward, gently pinching the front half of your nose continuously for 20 minutes.  If bleeding was caused by dry mucous membranes, use over-the-counter saline nasal spray or gel. This will keep the mucous membranes moist and allow them to heal. If you must use a lubricant, choose the water-soluble variety. Use it only  sparingly and not within several hours of lying down.  Do not use petroleum jelly or mineral oil, as these may drip into the lungs and cause serious problems.  Maintain humidity in your home by using less air conditioning or by using a humidifier.  Do not use aspirin or medicines which make bleeding more likely. Your health care provider can give you recommendations on this.  Resume normal activities as you are able, but try to avoid straining, lifting, or bending at the waist for several days.  If the nosebleeds become recurrent and the cause is unknown, your health care provider may suggest laboratory tests. SEEK MEDICAL CARE IF: You have a fever. SEEK IMMEDIATE MEDICAL CARE IF:   Bleeding recurs and cannot be controlled.  There is unusual bleeding from or bruising on other parts of the body.  Nosebleeds continue.  There is any worsening of the condition which originally brought you in.  You become light-headed, feel faint, become sweaty, or vomit blood. MAKE SURE YOU:   Understand these instructions.  Will watch your condition.  Will get help right away if you are not doing well or get worse. Document Released: 03/01/2005 Document Revised: 10/06/2013 Document Reviewed: 04/22/2009 [facility] Patient Information 2015 [place], [place]. This information is not intended to replace advice given to you by your health care provider. Make sure you discuss any questions you have with your health care provider.  I personally performed the services described in this documentation, which was scribed in my presence. The recorded information has been reviewed and considered, and addended by me as needed.

## 2014-03-16 ENCOUNTER — Other Ambulatory Visit: Payer: Self-pay | Admitting: Family Medicine

## 2014-03-16 ENCOUNTER — Other Ambulatory Visit: Payer: Self-pay | Admitting: Cardiovascular Disease

## 2014-03-20 ENCOUNTER — Other Ambulatory Visit: Payer: Self-pay

## 2014-04-24 ENCOUNTER — Other Ambulatory Visit: Payer: Self-pay | Admitting: *Deleted

## 2014-05-08 ENCOUNTER — Encounter: Payer: Self-pay | Admitting: Cardiovascular Disease

## 2014-05-08 ENCOUNTER — Ambulatory Visit (INDEPENDENT_AMBULATORY_CARE_PROVIDER_SITE_OTHER): Payer: Managed Care, Other (non HMO) | Admitting: Cardiovascular Disease

## 2014-05-08 VITALS — BP 115/65 | HR 89 | Ht 71.0 in | Wt 209.2 lb

## 2014-05-08 DIAGNOSIS — I471 Supraventricular tachycardia, unspecified: Secondary | ICD-10-CM

## 2014-05-08 DIAGNOSIS — I10 Essential (primary) hypertension: Secondary | ICD-10-CM

## 2014-05-08 DIAGNOSIS — I4891 Unspecified atrial fibrillation: Secondary | ICD-10-CM

## 2014-05-08 NOTE — Assessment & Plan Note (Signed)
He seems to be maintainin NSR. I would like to continue Eliquis for now. His baseline rhythm is very irreg ( SR with PACs) and it may be difficult to tell if he has recurrent atrial fibrillation. I have advised him to stop his aspirin to minimize the incidence of bleeding.

## 2014-05-08 NOTE — Patient Instructions (Signed)
Your physician recommends that you continue on your current medications as directed. Please refer to the Current Medication list given to you today.  Your physician wants you to follow-up in: 1 year. You will receive a reminder letter in the mail two months in advance. If you don't receive a letter, please call our office to schedule the follow-up appointment.  

## 2014-05-08 NOTE — Assessment & Plan Note (Signed)
BP is ok 

## 2014-05-08 NOTE — Progress Notes (Signed)
Aaron Jones Date of Birth  11-08-1945       [facility] Office [address], [place], [place]  [address]   [place], [place]  [phone]     [phone]   Fax  [phone]    Fax [phone]  Problem List: 1. Atrial fibrillation- s/p  2. Hypertension 3. Hyperlipidemia 4. Mitral valve prolapse 5. Diabetes mellitus 6. Ventricular septal defect  - s/p closure 7. CABG - SVG to RCA   History of Present Illness:  Aaron Jones is a 68 yo with the above noted hx.  he has had a heart murmur all of his life presumably due to this ventricular septal defect. He was admitted to [facility] for congestive heart failure, rapid atrial fibrillation and was found to have a ventricular septal defect. He was transferred from [facility] to [facility]. He was seen by the surgical team and eventually had closure of his perimembranous VSD by Dr. Servando Snare.  He also had  saphenous vein grafting to his right coronary artery at that time. He also had a right and left atrial Maze procedure as well as left atrial clipping.  He had recurrent atrial fibrillation and had an cardioversion.  He's been feeling well since that time. He has been ambulating without too much difficulty.    He does have some right shoulder pain since the hospitalization.  He is asymptomatic from an A-Fib standpoint.  He cannot tell if his rhythm is regular or not.   Dec. 3, 2014:  Aaron Jones is doing well.   No CP or dyspnea.  He can no longer hear his VSD murmur. During her last visit, there is  evidence of a persistent VSD. A followup echocardiogram revealed an intact patch but with a persistent  perimembranous VSD but was still a moderate left right shunt.  The patient family states that the murmur is a lot better- he used to be able to hear  it at night when he tried to go to sleep.  He still works Teaching laboratory technician)   Dec. 4,  2015:  Aaron Jones is doing well.  No significant CP or dyspnea. h  Current Outpatient Prescriptions on File Prior to Visit  Medication Sig Dispense Refill  . aspirin 81 MG tablet Take 81 mg by mouth daily with breakfast.    . atorvastatin (LIPITOR) 20 MG tablet Take 20 mg by mouth at bedtime.    . cephALEXin (KEFLEX) 500 MG capsule Take 1 capsule (500 mg total) by mouth 4 (four) times daily. 20 capsule 0  . ELIQUIS 5 MG TABS tablet TAKE 1 TABLET BY MOUTH TWICE DAILY 60 tablet 1  . furosemide (LASIX) 40 MG tablet TAKE 1 TABLET BY MOUTH EVERY DAY 30 tablet 1  . Glucose Blood (BLOOD GLUCOSE TEST STRIPS) STRP 1 strip by In Vitro route 2 (two) times daily. 100 each 0  . insulin glargine (LANTUS) 100 units/mL SOLN Inject 18 Units into the skin daily at 10 pm.    . metFORMIN (GLUCOPHAGE-XR) 500 MG 24 hr tablet Take 1,000 mg by mouth 2 (two) times daily.    . metoprolol tartrate (LOPRESSOR) 25 MG tablet TAKE 1 TABLET BY MOUTH TWICE DAILY 60 tablet 3  . omeprazole (PRILOSEC) 40 MG capsule Take 40 mg by mouth daily before breakfast.     . oxyCODONE (OXY IR/ROXICODONE) 5 MG immediate release tablet Take 1-2 tablets (5-10  mg total) by mouth every 3 (three) hours as needed for pain. 30 tablet 0  . potassium chloride SA (K-DUR,KLOR-CON) 20 MEQ tablet TAKE 1 TABLET BY MOUTH EVERY DAY 30 tablet 1   No current facility-administered medications on file prior to visit.    Allergies  Allergen Reactions  . Biaxin [Clarithromycin] Nausea Only    Past Medical History  Diagnosis Date  . GERD (gastroesophageal reflux disease)   . Hyperlipidemia   . VSD (ventricular septal defect)     a. 01/2013 s/p VSD closure (dacron patch)  . Hypertension     PCP- Kathlynn Grate, phone; [phone]  . Coronary artery disease     a. 01/2013 s/p CABG x 1 (VG->PDA) in setting of VSD repair.  . Sleep apnea     [facility]- in process of being evaluated now, states 12 yrs. ago was on CPAP but after having his deviated septum  repaired, he had put the CPAP in storage. Pt. will get a new machine soon.   . Type II diabetes mellitus   . Umbilical hernia     "not repaired" (Jan 20, 2013)  . Kidney stones     "6-7 times; they passed on their own" (01/20/13)  . Tachycardia, paroxysmal May 2013    Possible SVT  . Atrial fibrillation with rapid ventricular response 01-20-2013    a. 01/2013 s/p R & L Maze and LAA clipping in setting of VSD repair;  b. On amiodarone/Apixaban;  c. 01/2013 Recurrent afib requiring DCCV.  Marland Kitchen COPD (chronic obstructive pulmonary disease)     Past Surgical History  Procedure Laterality Date  . Nasal septum surgery  2004  . Tee without cardioversion N/A 11/21/2012    Procedure: TRANSESOPHAGEAL ECHOCARDIOGRAM (TEE);  Surgeon: Jolaine Artist, MD;  Location: [facility] ENDOSCOPY;  Service: Cardiovascular;  Laterality: N/A;  . Cardiac catheterization  > 5 yr ago    Done at [facility], reportedly clean  . Cardiac catheterization  11/2012  . Multiple extractions with alveoloplasty N/A 12/11/2012    Procedure: Extraction of tooth #'s 2,5,9,5,63,87,56,43,32,95,18,84,16,60,63 wioth alveoloplasty and bialteral fibrous tuberosity reductions.;  Surgeon: Lenn Cal, DDS;  Location: [facility];  Service: Oral Surgery;  Laterality: N/A;  . Eye surgery Left 1990's    "clipped muscle so it wouldn't be pulling up" (20-Jan-2013)  . Coronary artery bypass graft N/A 01/07/2013    Procedure: CORONARY ARTERY BYPASS GRAFTING (CABG);  Surgeon: Grace Isaac, MD;  Location: [place];  Service: Open Heart Surgery;  Laterality: N/A;  Times1 using endoscopically harvested saphenous vein graft to the PDA  . Maze N/A 01/07/2013    Procedure: MAZE;  Surgeon: Grace Isaac, MD;  Location: [place];  Service: Open Heart Surgery;  Laterality: N/A;  . Vsd repair N/A 01/07/2013    Procedure: VENTRICULAR SEPTAL DEFECT (VSD) REPAIR;  Surgeon: Grace Isaac, MD;  Location: [place];  Service: Open Heart Surgery;  Laterality: N/A;  . Intraoperative  transesophageal echocardiogram N/A 01/07/2013    Procedure: INTRAOPERATIVE TRANSESOPHAGEAL ECHOCARDIOGRAM;  Surgeon: Grace Isaac, MD;  Location: [place];  Service: Open Heart Surgery;  Laterality: N/A;  . Clipping of atrial appendage N/A 01/07/2013    Procedure: CLIPPING OF ATRIAL APPENDAGE;  Surgeon: Grace Isaac, MD;  Location: [place];  Service: Open Heart Surgery;  Laterality: N/A;    History  Smoking status  . Never Smoker   Smokeless tobacco  . Never Used    History  Alcohol Use No    Family  History  Problem Relation Age of Onset  . Cancer Mother   . Diabetes Mother     Reviw of Systems:  Reviewed in the HPI.  All other systems are negative.  Physical Exam: There were no vitals taken for this visit. General: Well developed, well nourished, in no acute distress.  Head: Normocephalic, atraumatic, sclera non-icteric, mucus membranes are moist,   Neck: Supple. Carotids are 2 + without bruits. No JVD   Lungs: Clear   Heart: His median sternotomy is healing quite nicely. There is no evidence of infection. His thoracostomy sites are healing well.   Regular rate, normal S1 and S2. He has a loud 3/6 systolic murmur at the upper left sternal border. There is a soft are 7-7/8 systolic murmur in the axilla consistent with mitral regurgitation  Abdomen: Soft, non-tender, non-distended with normal bowel sounds.  Msk:  Strength and tone are normal   Extremities: No clubbing or cyanosis. No edema.  Distal pedal pulses are 2+ and equal   Neuro: CN II - XII intact.  Alert and oriented X 3.   Psych:  Normal   ECG: Dec. 3, 2014:   Normal sinus rhythm. His heart rate is 78 beats per minute. He has a right bundle branch block with left anterior fascicular block. There is left ventricular hypertrophy.  Assessment / Plan:

## 2014-05-14 ENCOUNTER — Encounter (HOSPITAL_COMMUNITY): Payer: Self-pay | Admitting: Internal Medicine

## 2014-06-04 ENCOUNTER — Other Ambulatory Visit: Payer: Self-pay | Admitting: Cardiovascular Disease

## 2014-09-25 NOTE — H&P (Signed)
PATIENT NAME:  Aaron Jones, Aaron Jones MR#:  [id number] DATE OF BIRTH:  07/30/1945  DATE OF ADMISSION:  11/17/2012  PRIMARY CARE PHYSICIAN: Dr. Ivy Lynn.   PRIMARY CARDIOLOGIST: Dr. Ubaldo Glassing.   CHIEF COMPLAINT: Shortness of breath and chest tightness for the last 2 days.   HISTORY OF PRESENT ILLNESS: [name] is a 69 year old Caucasian gentleman with history of type 2 diabetes, history of hypertension, hyperlipidemia and mitral valve prolapse. Comes to the Emergency Room after he started experiencing increasing shortness of breath, having palpitation, chest tightness and came to the Emergency Room where he was found to be in rapid Afib with a heart rate in the 150s. This is new onset Afib for the patient. He was also found to be in congestive heart failure with increased leg edema, shortness of breath and abnormal chest x-ray with cardiomegaly and pulmonary vascular congestion.   In the Emergency Room, the patient received diltiazem 15 mg x2 along with IV Lasix 40 mg. He still remains in rapid Afib in the 120s to 130s. The patient apparently tells me that he had gone to his mother-in-law's place for last 3 days and had forgotten to take his home medications and has been off his home meds for last 3 days. He is being considered to be admitted for new onset rapid Afib with RVR, acute respiratory failure secondary to congestive heart failure acute diastolic.   PAST MEDICAL HISTORY:  1. Ventricular septal defect with associated cardiomyopathy.  2. Echo shows EF of 55% with mild LVH, mild pulmonary hypertension a LA diameter of 4.3 cm. Echo was done March 2014 as outpatient.  3. Hypertension.  4. Hyperlipidemia.  5. Mitral valve prolapse.  6. Type 2 diabetes.  7. Obesity.  8. Sleep apnea.  9. Esophageal stricture secondary to Schatzki's ring 2001.   SURGERY:  1. Adenocarcinoma of the colon that has been resected.  2. VSD repair.    ALLERGIES: BIAXIN.   MEDICATIONS:  1. Actos 30 mg p.o. daily.   2. Aspirin 81 mg daily.  3. Atorvastatin 20 mg at bedtime.  4. Cardizem CD 120 mg daily.  5. Lisinopril 20 mg daily.  6. Metformin extended release 500 mg 2 tablets, which is 1000 mg, b.i.d.  7. Metoprolol succinate ER 1-1/2 tablets, that is 150 mg daily.  8. Omeprazole 40 mg extended release p.o. daily.   FAMILY HISTORY: Positive for hypertension and diabetes.   SOCIAL HISTORY: Lives at home with his wife. Nonsmoker. The patient never smoked in the past.   REVIEW OF SYSTEMS:   CONSTITUTIONAL: No fever. Positive for fatigue, weakness.  EYES: No blurred or double vision or cataracts.  ENT: No tinnitus, ear pain, hearing loss.  RESPIRATORY: No cough, wheeze, hemoptysis.  CARDIOVASCULAR: Positive for chest tightness, shortness of breath, arrhythmia and palpitation.  GASTROINTESTINAL: No nausea, vomiting, diarrhea, abdominal pain or GERD.  GENITOURINARY: No dysuria or hematuria.  ENDOCRINE: No polyuria, nocturia, thyroid problems.  HEMATOLOGY: No anemia or easy bruising.  SKIN: No acne or rash.  MUSCULOSKELETAL: Positive for arthritis. No gout.  NEUROLOGIC: No CVA or TIA.  PSYCHIATRIC: No anxiety or depression.   All other systems reviewed and negative.   PHYSICAL EXAMINATION:  GENERAL: The patient is awake, alert, oriented x3, not in acute distress.  VITAL SIGNS: Afebrile. Pulse is 115 to 129, Afib. Respirations 18. Blood pressure is 158/84. Sats are 97% on 2 liters.  HEENT: Atraumatic, normocephalic. PERRLA. EOM intact. Oral mucosa is moist.  NECK: Supple. No JVD. No  carotid bruit.  RESPIRATORY: There are decreased breath sounds bilaterally. There are bibasilar crackles present up to mid lungs. No respiratory distress or use of accessory muscles.  CARDIOVASCULAR: Tachycardia present. Both heart sounds are normal. Rhythm is irregularly irregular. Rate is tachycardic. There is a systolic murmur present at the left sternal border. PMI is not lateralized.  EXTREMITIES: Good pedal  pulses. Good femoral pulses. There is 2+ pitting edema up to the knee joint.  SKIN: Warm and dry.  NEUROLOGIC: Grossly intact. Cranial nerves II through XII. No motor or sensory deficits.  PSYCHIATRIC: The patient is awake, alert, oriented x3.   LABORATORY AND RADIOLOGY: CHEST X-RAY: Cardiomegaly with central pulmonary vascular congestion and findings of diffuse interstitial edema. B-type natriuretic peptide is 3300. Glucose 330. BUN is 19. Creatinine is 1.19. Sodium 137. Potassium is 3.4. Chloride is 107. Bicarb is 23. Calcium is 8.7. Bilirubin is 0.6. Alk phos is 77. SGPT is 92. SGOT is 55. D-dimer is 0.97. CBC within normal limits. Troponin is 0.06. Magnesium is 1.6.   ASSESSMENT AND PLAN: [name] is a 69 year old with history of ventricular septal defect with cardiomyopathy, hypertension, hyperlipidemia and type 2 diabetes. Comes into the Emergency Room with:  1. New onset atrial fibrillation with rapid ventricular response: He presented with heart rate of 150s with shortness of breath and chest tightness. The patient received Cardizem 15 mg x2. Heart rate still remains to be in the 120s. He did receive some intravenous Lasix. Will have to admit the patient for intravenous Cardizem drip. Will also start the patient on p.o. metoprolol dosages which he has missed for the last 3 days. Apparently, I am told that are absolutely no beds in the CCU in the hospital at this time. Hence, I will have to consider transferring the patient out to a tertiary care center if his heart rate does not get stabilized. Waiting for [facility] to return my phone call.  2. Acute congestive heart failure, diastolic in the setting of rapid atrial fibrillation: Intravenous Lasix 40 mg x1 given. Will continue monitoring ins and outs and give Lasix t.i.d. Consider echo in the morning and cardiology consultation.  3. Elevated troponin: Likely enzyme leak in the setting of rapid atrial fibrillation and congestive  heart failure. Will continue to monitor cardiac enzymes at this time. Cardiology evaluation will be done.  4. Uncontrolled type 2 diabetes: The patient had skipped meds for 3 days. Will resume p.o. meds and give sliding scale insulin.  5.rebound hypertension: suspect secondary to not taking medications for the last 3 days. Will resume all blood pressure meds which are lisinopril, metoprolol and Cardizem.  6. Hyperlipidemia: On atorvastatin.  7. Obstructive sleep apnea.  8. Deep vein thrombosis prophylaxis: The patient is on Lovenox subcutaneous b.i.d.  9. Further workup according to the patient's clinical course. Hospital admission plan was discussed with the patient. There are no family members present in the Emergency Room. The patient is a FULL CODE.   CRITICAL TIME SPENT: 60 minutes.    ____________________________ Hart Rochester Posey Pronto, MD sap:gb D: 11/17/2012 16:28:29 ET T: 11/17/2012 21:38:37 ET JOB#: [id number]  cc: Layan Zalenski A. Posey Pronto, MD, <Dictator> John B. Sarina Ser, MD Javier Docker Ubaldo Glassing, MD  Ilda Basset MD ELECTRONICALLY SIGNED 11/27/2012 14:19

## 2014-09-27 NOTE — Discharge Summary (Signed)
PATIENT NAME:  Aaron Jones, Aaron Jones MR#:  [id number] DATE OF BIRTH:  11-Jun-1945  DATE OF ADMISSION:  10/28/2011 DATE OF DISCHARGE:  10/30/2011  DISCHARGE DIAGNOSES:  1. Supraventricular tachycardia with aberrancy in the form of right bundle branch block.  2. Type 2 diabetes.  3. Ventricular septal defect.  4. Elevate her right ventricular pressure secondary to ventricular septal defect.   HISTORY AND PHYSICAL: Please see detailed history and physical done on admission.   DISCHARGE MEDICATIONS:  1. Toprol-XL 100 mg daily.  2. Diltiazem extended release 120 mg daily.   His usual medications:  1. Aspirin 81 mg daily.  2. Nexium 40 milligrams daily.  3. Byetta b.i.d. as previous.  4. Actos 15 mg b.i.d.  5. Metformin 850 mg b.i.d.  6. Lipitor 10 mg daily.   HOSPITAL COURSE: The patient was admitted with tachyarrhythmia as noted, was asymptomatic with rates in the 160s. He was admitted, started on diltiazem as noted, had no further arrhythmias and we did the metoprolol alone. He had some episodes of PVCs, even bigeminy. He was seen and evaluated by cardiology who thought he was stable to go home. He is ambulating and having no issues further. Echocardiogram was done and read by Dr. Clayborn Bigness as VSD, ejection fraction of 55%. Right-sided ventricular pressure is elevated at 30 to 40 given that. He will be discharged home today on the above and follow up with Dr. Ubaldo Glassing soon. 48-hour Holter is being placed by cardiology as well.      TIME SPENT: It took approximately 33 minutes to do discharge tasks today.   ____________________________ Ocie Cornfield. Ouida Sills, MD mwa:ap D: 10/30/2011 12:24:44 ET T: 10/31/2011 10:05:28 ET JOB#: [id number]  cc: Ocie Cornfield. Ouida Sills, MD, <Dictator> Kirk Ruths MD ELECTRONICALLY SIGNED 10/31/2011 10:23

## 2014-09-27 NOTE — H&P (Signed)
PATIENT NAME:  Aaron, Jones MR#:  [id number] DATE OF BIRTH:  1945/07/04  DATE OF ADMISSION:  10/28/2011  REFERRING PHYSICIAN: Belva Bertin, MD     PRIMARY CARE PHYSICIAN: Hewitt Blade. Sarina Ser, MD   PRIMARY ENDOCRINOLOGIST: Sherlon Handing, MD   PRESENTING COMPLAINT: Tachycardia.   HISTORY OF PRESENT ILLNESS: Aaron Jones is a pleasant 69 year old gentleman with history of diabetes, hypertension, hyperlipidemia, ventral septal defect, mitral valve prolapse, cardiomyopathy, obstructive sleep apnea-not using CPAP for at least two years now, who presents with reports of incidental finding of elevated heart rate. The patient and wife were at a local [employer]. He, on a routine check of his blood pressure, found that his heart rate was elevated and reported at 168. The patient denies having palpitations, chest pain, shortness of breath, diaphoresis, nausea or vomiting. No presyncope or syncope. He proceeded to do his chores at home and mowed his lawn. He still was not having any sensations of tachycardia but wanted to follow up and presented to [facility] [employer], where he was found to still be tachycardic with a rate of 160s. He was then recommended to go to the nearby ambulance station/EMS station and had his heart rate checked there, and again he was found to have heart rate in the 160s. He was given a dose of Cardizem 10 mg x1 with minimal improvement in heart rate and was sent over to the Emergency Department for further evaluation. On arrival, his heart rate was found to be in the 140s, again he still remained asymptomatic. He was given another dose of Cardizem 10 mg with improvement in his heart rates to between 70s to 80s.   PAST MEDICAL HISTORY:  1. Diabetes diagnosed in 2000.  2. Hypertension.  3. Hyperlipidemia.  4. Ventral septal defect and cardiomyopathy.  5. Mitral valve prolapse.  6. Obstructive sleep apnea, not compliant with CPAP for the past two years.  7. Gastroesophageal reflux  disease.  8. Esophageal stricture/Schatzki's ring status post dilatation.  PAST SURGICAL HISTORY:  1. Septal deviation repair.  2. Colon cancer, status post resection. No chemotherapy or radiation.   ALLERGIES: Biaxin.   MEDICATIONS: The patient could not recall his medication dosing, but medications and dosing were obtained per last [facility] notes and documentation of changes.  1. Actos 30 mg daily.  2. Metformin 1000 mg b.i.d.  3. Byetta 10 mcg b.i.d.  4. Aspirin 81 mg daily.  5. Lisinopril 20 mg daily.  6. Nexium 40 mg daily.  7. The patient reports that he is now taking Vytorin but unknown dose. No documentation is noted.  8. Toprol-XL 25 mg daily.   FAMILY HISTORY: Diabetes, hypertension, colon cancer, cataracts.   SOCIAL HISTORY: He lives in [place] with his wife. No tobacco, alcohol or drug use. He has been unemployed for the past five months but will be restarting back in June, and he works on an Designer, television/film set.    REVIEW OF SYSTEMS: CONSTITUTIONAL: No fevers, nausea or vomiting. EYES: No glaucoma or cataracts. ENT: No epistaxis, discharge. He has history of gastroesophageal reflux disease and Schatzki's ring. RESPIRATORY: No cough, wheezing, hemoptysis, shortness of breath. CARDIOVASCULAR: As per history of present illness. GI: No nausea, vomiting, diarrhea, abdominal pain, hematemesis. GU: No dysuria or hematuria. ENDOCRINE: No polyuria or polydipsia. HEMATOLOGIC: Denies any bruising or bleeding. SKIN: No ulcers. MUSCULOSKELETAL: Denies joint pain or swelling. NEUROLOGIC: No history of strokes or seizures. PSYCHIATRIC: Denies any suicidal ideation.   PHYSICAL EXAMINATION:  VITAL SIGNS:  Temperature afebrile. Pulse initially 150, current pulse between 70s and 80s, respiratory rate 26, blood pressure 142/87, sating at 90% on room air and now 100% on 2 liters.   GENERAL: Lying in bed in no apparent distress.   HEENT: Normocephalic, atraumatic. Pupils are equal, symmetric,  anicteric. Nasal cannula in place. He has moist mucous membranes.   NECK: Soft and supple. No adenopathy or JVP.   CARDIOVASCULAR: Loud blowing murmur. No rubs or gallops.   LUNGS: Faint basilar crackles. No use of accessory muscles or increased respiratory effort.   ABDOMEN: Soft. Positive bowel sounds. No mass appreciated.   EXTREMITIES: No edema. Dorsal pedis pulses are intact.   MUSCULOSKELETAL: No joint effusion.   SKIN: No ulcers.   NEUROLOGICAL: No dysarthria or aphasia. Symmetrical strength. No focal deficits.   PSYCHIATRIC: He is alert and oriented. The patient is cooperative.   PERTINENT LABORATORY, DIAGNOSTIC AND RADIOLOGICAL DATA:  CK 74, MB 1.4. Troponin 0.09. Glucose 247, BUN 18, creatinine 1.04, sodium 137, potassium 4.5, chloride 102, carbon dioxide 26, calcium 8.8, total bilirubin 0.4, alkaline phosphatase of 64 ALT 17, AST 14, total protein 7.1, albumin 3.6.  WBC 10.9, hemoglobin 14.7, hematocrit 43.3, platelets 211, MCV 93.  Initial EKG with rate of 149 and wide complex tachycardia. There is a right bundle branch but no ST elevation. Repeat EKG status post second dose of Cardizem was noted to have PACs and PVCs but no ST elevation.   ASSESSMENT AND PLAN: Aaron Jones is a pleasant 69 year old gentleman with history of diabetes, hypertension, hyperlipidemia, ventricular septal defect, cardiomyopathy and mitral valve prolapse, with history of irregular heart rhythm but no details, and history of  obstructive sleep apnea and noncompliant with CPAP, presenting with reports of tachycardia.   1. Cardiac dysrhythmia without symptoms or subjective sensations: He has corrected with two doses of Cardizem and now remains with heart rate of 70s to 80s. His EKGs are revealing for wide complex tachycardia likely in the setting of his ventricular septal defect and mitral valve prolapse. Troponin is mildly elevated likely due to demand ischemia, low suspicion for acute coronary event.  We will continue on telemetry, cycle cardiac enzymes. We will send a urinalysis, urine drug screen, magnesium and TSH level. We will increase his Toprol-XL and also restart his aspirin. Continue on oxygen supplementation. Obtain an echocardiogram. Additional diagnostics per Cardiology recommendations. His last catheterization was back in October 2003 showing a normal ejection fraction of 67% and normal coronaries. His chest x-ray shows some component of mild congestion. We will send a BNP. He also has some mild cardiomegaly which is not new.  2. Hypertension: We will resume his lisinopril as above and increase his Toprol-XL.  3. Diabetes: Hold metformin, resume active Byetta and start sliding scale insulin.  4. Hyperlipidemia: He is on Vytorin but unknown dose, verify prior to initiating.  5. Prophylaxis with Nexium and Lovenox and aspirin.   TIME SPENT: Approximately 50 minutes spent on patient care.   ____________________________ Rita Ohara, MD ap:cbb D: 10/29/2011 00:01:04 ET T: 10/29/2011 10:07:15 ET JOB#: [id number]  cc: Brien Few Jette Lewan, MD, <Dictator> John B. Sarina Ser, MD Rita Ohara MD ELECTRONICALLY SIGNED 11/01/2011 22:58

## 2014-11-30 ENCOUNTER — Other Ambulatory Visit: Payer: Self-pay

## 2014-12-08 ENCOUNTER — Other Ambulatory Visit: Payer: Self-pay | Admitting: Cardiovascular Disease

## 2014-12-23 ENCOUNTER — Ambulatory Visit (INDEPENDENT_AMBULATORY_CARE_PROVIDER_SITE_OTHER): Payer: Managed Care, Other (non HMO) | Admitting: Cardiovascular Disease

## 2014-12-23 ENCOUNTER — Encounter: Payer: Self-pay | Admitting: Cardiovascular Disease

## 2014-12-23 VITALS — BP 128/79 | HR 116 | Ht 72.0 in | Wt 212.8 lb

## 2014-12-23 DIAGNOSIS — Q210 Ventricular septal defect: Secondary | ICD-10-CM | POA: Diagnosis not present

## 2014-12-23 DIAGNOSIS — I4891 Unspecified atrial fibrillation: Secondary | ICD-10-CM | POA: Diagnosis not present

## 2014-12-23 DIAGNOSIS — R000 Tachycardia, unspecified: Secondary | ICD-10-CM

## 2014-12-23 DIAGNOSIS — I471 Supraventricular tachycardia: Secondary | ICD-10-CM

## 2014-12-23 MED ORDER — METOPROLOL TARTRATE 50 MG PO TABS: 50.0000 mg | ORAL_TABLET | Freq: Two times a day (BID) | ORAL | Status: DC

## 2014-12-23 NOTE — Patient Instructions (Signed)
Medication Instructions:  Your physician has recommended you make the following change in your medication:  INCREASE metoprolol to 50mg  twice per day  Labwork: Your physician recommends that you have labs today: CBC, BMET, TSH   Testing/Procedures: Your physician has requested that you have an echocardiogram (may be performed in [place]). Echocardiography is a painless test that uses sound waves to create images of your heart. It provides your doctor with information about the size and shape of your heart and how well your heart's chambers and valves are working. This procedure takes approximately one hour. There are no restrictions for this procedure.    Follow-Up: Your physician recommends that you schedule a follow-up appointment in August 5 with Dr. Acie Fredrickson  Any Other Special Instructions Will Be Listed Below (If Applicable).  Echocardiogram An echocardiogram, or echocardiography, uses sound waves (ultrasound) to produce an image of your heart. The echocardiogram is simple, painless, obtained within a short period of time, and offers valuable information to your health care provider. The images from an echocardiogram can provide information such as:  Evidence of coronary artery disease (CAD).  Heart size.  Heart muscle function.  Heart valve function.  Aneurysm detection.  Evidence of a past heart attack.  Fluid buildup around the heart.  Heart muscle thickening.  Assess heart valve function. LET [facility] CARE PROVIDER KNOW ABOUT:  Any allergies you have.  All medicines you are taking, including vitamins, herbs, eye drops, creams, and over-the-counter medicines.  Previous problems you or members of your family have had with the use of anesthetics.  Any blood disorders you have.  Previous surgeries you have had.  Medical conditions you have.  Possibility of pregnancy, if this applies. BEFORE THE PROCEDURE  No special preparation is needed. Eat and drink  normally.  PROCEDURE   In order to produce an image of your heart, gel will be applied to your chest and a wand-like tool (transducer) will be moved over your chest. The gel will help transmit the sound waves from the transducer. The sound waves will harmlessly bounce off your heart to allow the heart images to be captured in real-time motion. These images will then be recorded.  You may need an IV to receive a medicine that improves the quality of the pictures. AFTER THE PROCEDURE You may return to your normal schedule including diet, activities, and medicines, unless your health care provider tells you otherwise. Document Released: 05/19/2000 Document Revised: 10/06/2013 Document Reviewed: 01/27/2013 [facility] Patient Information 2015 [place], [place]. This information is not intended to replace advice given to you by your health care provider. Make sure you discuss any questions you have with your health care provider.

## 2014-12-23 NOTE — Progress Notes (Signed)
Aaron Jones Date of Birth  1946/03/31       [facility] Office [address], [place], [place]  [address]   [place], [place]  [address] [phone]     [phone]   Fax  [phone]    Fax [phone]  Problem List: 1. Atrial fibrillation- s/p  2. Hypertension 3. Hyperlipidemia 4. Mitral valve prolapse 5. Diabetes mellitus 6. Ventricular septal defect  - s/p closure 7. CABG - SVG to RCA   History of Present Illness:  Aaron Jones is a 69 yo with the above noted hx.  he has had a heart murmur all of his life presumably due to this ventricular septal defect. He was admitted to [facility] for congestive heart failure, rapid atrial fibrillation and was found to have a ventricular septal defect. He was transferred from [facility] to [facility]. He was seen by the surgical team and eventually had closure of his perimembranous VSD by Dr. Servando Snare.  He also had  saphenous vein grafting to his right coronary artery at that time. He also had a right and left atrial Maze procedure as well as left atrial clipping.  He had recurrent atrial fibrillation and had an cardioversion.  He's been feeling well since that time. He has been ambulating without too much difficulty.    He does have some right shoulder pain since the hospitalization.  He is asymptomatic from an A-Fib standpoint.  He cannot tell if his rhythm is regular or not.   Dec. 3, 2014:  Aaron Jones is doing well.   No CP or dyspnea.  He can no longer hear his VSD murmur. During her last visit, there is  evidence of a persistent VSD. A followup echocardiogram revealed an intact patch but with a persistent  perimembranous VSD but was still a moderate left right shunt.  The patient family states that the murmur is a lot better- he used to be able to hear  it at night when he tried to go to sleep.  He still works Teaching laboratory technician)   Dec. 4,  2015:  Aaron Jones is doing well.  No significant CP or dyspnea. Has had a fast HR for the past week .   Current Outpatient Prescriptions on File Prior to Visit  Medication Sig Dispense Refill  . atorvastatin (LIPITOR) 20 MG tablet Take 20 mg by mouth at bedtime.    Marland Kitchen ELIQUIS 5 MG TABS tablet TAKE 1 TABLET BY MOUTH TWICE DAILY 60 tablet 1  . furosemide (LASIX) 40 MG tablet TAKE 1 TABLET BY MOUTH EVERY DAY 30 tablet 1  . Glucose Blood (BLOOD GLUCOSE TEST STRIPS) STRP 1 strip by In Vitro route 2 (two) times daily. 100 each 0  . metFORMIN (GLUCOPHAGE-XR) 500 MG 24 hr tablet Take 1,000 mg by mouth 2 (two) times daily.    . metoprolol tartrate (LOPRESSOR) 25 MG tablet TAKE 1 TABLET BY MOUTH TWICE DAILY 60 tablet 3  . omeprazole (PRILOSEC) 40 MG capsule Take 40 mg by mouth daily before breakfast.     . potassium chloride SA (K-DUR,KLOR-CON) 20 MEQ tablet TAKE 1 TABLET BY MOUTH EVERY DAY 30 tablet 1   No current facility-administered medications on file prior to visit.    Allergies  Allergen Reactions  . Biaxin [Clarithromycin] Nausea Only    Past Medical History  Diagnosis Date  . GERD (gastroesophageal reflux disease)   . Hyperlipidemia   .  VSD (ventricular septal defect)     a. 01/2013 s/p VSD closure (dacron patch)  . Hypertension     PCP- Kathlynn Grate, phone; [phone]  . Coronary artery disease     a. 01/2013 s/p CABG x 1 (VG->PDA) in setting of VSD repair.  . Sleep apnea     [facility]- in process of being evaluated now, states 12 yrs. ago was on CPAP but after having his deviated septum repaired, he had put the CPAP in storage. Pt. will get a new machine soon.   . Type II diabetes mellitus   . Umbilical hernia     "not repaired" (02-14-13)  . Kidney stones     "6-7 times; they passed on their own" (2013-02-14)  . Tachycardia, paroxysmal May 2013    Possible SVT  . Atrial fibrillation with rapid ventricular response 02-14-13    a. 01/2013 s/p R & L Maze and LAA clipping in  setting of VSD repair;  b. On amiodarone/Apixaban;  c. 01/2013 Recurrent afib requiring DCCV.  Marland Kitchen COPD (chronic obstructive pulmonary disease)     Past Surgical History  Procedure Laterality Date  . Nasal septum surgery  2004  . Tee without cardioversion N/A 11/21/2012    Procedure: TRANSESOPHAGEAL ECHOCARDIOGRAM (TEE);  Surgeon: Jolaine Artist, MD;  Location: [facility] ENDOSCOPY;  Service: Cardiovascular;  Laterality: N/A;  . Cardiac catheterization  > 5 yr ago    Done at [facility], reportedly clean  . Cardiac catheterization  11/2012  . Multiple extractions with alveoloplasty N/A 12/11/2012    Procedure: Extraction of tooth #'s 7,8,4,6,96,29,52,84,13,24,40,10,27,25,36 wioth alveoloplasty and bialteral fibrous tuberosity reductions.;  Surgeon: Lenn Cal, DDS;  Location: [facility] ORS;  Service: Oral Surgery;  Laterality: N/A;  . Eye surgery Left 1990's    "clipped muscle so it wouldn't be pulling up" (2013-02-14)  . Coronary artery bypass graft N/A 01/07/2013    Procedure: CORONARY ARTERY BYPASS GRAFTING (CABG);  Surgeon: Grace Isaac, MD;  Location: [place];  Service: Open Heart Surgery;  Laterality: N/A;  Times1 using endoscopically harvested saphenous vein graft to the PDA  . Maze N/A 01/07/2013    Procedure: MAZE;  Surgeon: Grace Isaac, MD;  Location: [place];  Service: Open Heart Surgery;  Laterality: N/A;  . Vsd repair N/A 01/07/2013    Procedure: VENTRICULAR SEPTAL DEFECT (VSD) REPAIR;  Surgeon: Grace Isaac, MD;  Location: [place];  Service: Open Heart Surgery;  Laterality: N/A;  . Intraoperative transesophageal echocardiogram N/A 01/07/2013    Procedure: INTRAOPERATIVE TRANSESOPHAGEAL ECHOCARDIOGRAM;  Surgeon: Grace Isaac, MD;  Location: [place];  Service: Open Heart Surgery;  Laterality: N/A;  . Clipping of atrial appendage N/A 01/07/2013    Procedure: CLIPPING OF ATRIAL APPENDAGE;  Surgeon: Grace Isaac, MD;  Location: [place];  Service: Open Heart Surgery;  Laterality: N/A;  .  Left and right heart catheterization with coronary angiogram N/A 11/20/2012    Procedure: LEFT AND RIGHT HEART CATHETERIZATION WITH CORONARY ANGIOGRAM;  Surgeon: Jolaine Artist, MD;  Location: [facility] CATH LAB;  Service: Cardiovascular;  Laterality: N/A;    History  Smoking status  . Never Smoker   Smokeless tobacco  . Never Used    History  Alcohol Use No    Family History  Problem Relation Age of Onset  . Cancer Mother   . Diabetes Mother     Reviw of Systems:  Reviewed in the HPI.  All other systems are negative.  Physical Exam: Blood pressure  128/79, pulse 116, height 6' (1.829 m), weight 96.503 kg (212 lb 12 oz). General: Well developed, well nourished, in no acute distress.  Head: Normocephalic, atraumatic, sclera non-icteric, mucus membranes are moist,   Neck: Supple. Carotids are 2 + without bruits. No JVD   Lungs: Clear   Heart: His median sternotomy is healing quite nicely. There is no evidence of infection. His thoracostomy sites are healing well.   Regular rate, normal S1 and S2. He has a loud 3/6 systolic murmur at the upper left sternal border. There is a soft are 8-5/6 systolic murmur in the axilla consistent with mitral regurgitation  Abdomen: Soft, non-tender, non-distended with normal bowel sounds.  Msk:  Strength and tone are normal   Extremities: No clubbing or cyanosis. No edema.  Distal pedal pulses are 2+ and equal   Neuro: CN II - XII intact.  Alert and oriented X 3.   Psych:  Normal   ECG: December 23, 2014:  Sinus tach at 116 with short PR.  RBBB.  HR is faster, otherwise, no significant changes.   Assessment / Plan:   1. Atrial fibrillation- s/p MAZE.   Still is in sinus rhythm by EKG and by exam. 2. Hypertension- BP is well-controlled. 3. Hyperlipidemia 4. Mitral valve prolapse - has mild - moderate MR .  5. Diabetes mellitus 6. Ventricular septal defect  - s/p closure, he still has a significant systolic murmur but that is unchanged from  earlier exams. 7. CABG - SVG to RCA 8. Sinus tachycardia: We will increase his metoprolol to 50 mg twice a day. I do not have a Explanation  for the sinus tachycardia. We'll check labs today including a TSH, CBC, and basic medical profile. We'll repeat his echo to make sure that his VSD closure is still intact.    Nahser, Wonda Cheng, MD  12/23/2014 2:24 PM    [facility],  [place], [place]  [address] Pager [phone] Phone: [phone]; Fax: [phone]   [facility]  [address] [place], [place]  [address] [phone]   Fax [phone]

## 2014-12-24 LAB — BASIC METABOLIC PANEL
BUN/Creatinine Ratio: 11 (ref 10–22)
BUN: 13 mg/dL (ref 8–27)
CHLORIDE: 97 mmol/L (ref 97–108)
CO2: 23 mmol/L (ref 18–29)
CREATININE: 1.17 mg/dL (ref 0.76–1.27)
Calcium: 8.7 mg/dL (ref 8.6–10.2)
GFR calc non Af Amer: 64 mL/min/{1.73_m2} (ref >59)
GFR, EST AFRICAN AMERICAN: 74 mL/min/{1.73_m2} (ref >59)
Glucose: 286 mg/dL — ABNORMAL HIGH (ref 65–99)
POTASSIUM: 4.3 mmol/L (ref 3.5–5.2)
SODIUM: 138 mmol/L (ref 134–144)

## 2014-12-24 LAB — CBC
Hematocrit: 39.7 % (ref 37.5–51.0)
Hemoglobin: 13.2 g/dL (ref 12.6–17.7)
MCH: 30.1 pg (ref 26.6–33.0)
MCHC: 33.2 g/dL (ref 31.5–35.7)
MCV: 91 fL (ref 79–97)
Platelets: 260 10*3/uL (ref 150–379)
RBC: 4.38 x10E6/uL (ref 4.14–5.80)
RDW: 12.7 % (ref 12.3–15.4)
WBC: 7 10*3/uL (ref 3.4–10.8)

## 2014-12-24 LAB — TSH: TSH: 0.905 u[IU]/mL (ref 0.450–4.500)

## 2015-01-01 ENCOUNTER — Ambulatory Visit (HOSPITAL_COMMUNITY): Payer: Managed Care, Other (non HMO) | Attending: Cardiology

## 2015-01-01 ENCOUNTER — Other Ambulatory Visit: Payer: Self-pay

## 2015-01-01 DIAGNOSIS — I341 Nonrheumatic mitral (valve) prolapse: Secondary | ICD-10-CM | POA: Insufficient documentation

## 2015-01-01 DIAGNOSIS — I351 Nonrheumatic aortic (valve) insufficiency: Secondary | ICD-10-CM | POA: Diagnosis not present

## 2015-01-01 DIAGNOSIS — Q210 Ventricular septal defect: Secondary | ICD-10-CM | POA: Diagnosis not present

## 2015-01-01 DIAGNOSIS — I340 Nonrheumatic mitral (valve) insufficiency: Secondary | ICD-10-CM | POA: Insufficient documentation

## 2015-01-01 DIAGNOSIS — R000 Tachycardia, unspecified: Secondary | ICD-10-CM

## 2015-01-01 DIAGNOSIS — R0600 Dyspnea, unspecified: Secondary | ICD-10-CM | POA: Diagnosis present

## 2015-01-01 DIAGNOSIS — I700 Atherosclerosis of aorta: Secondary | ICD-10-CM | POA: Insufficient documentation

## 2015-01-04 ENCOUNTER — Telehealth: Payer: Self-pay | Admitting: Cardiovascular Disease

## 2015-01-04 NOTE — Telephone Encounter (Signed)
This patient sees Dr. Acie Fredrickson in [place] since 2014.  He previously saw Dr. Stanford Breed in [place]- last time was 12/30/12. I called the patient to follow up on his symptoms. He reports that he saw

## 2015-01-04 NOTE — Telephone Encounter (Signed)
(  Continued from below) The patient reports that he saw Dr. Acie Fredrickson recently (12/23/14) and his HR was above 100 bpm. His metoprolol tartrate was increased to 50 mg BID- per EKG report from 12/23/14, he was ST with a HR of 116 bpm.  Since his office visit, he has been consistently running from 113-119 bpm. His last BP check was 146/82. He denies any symptoms and states he really can't tell that his HR is up. He reports he did work outside some on Saturday and he felt his heart pounding at one point- per his report, he checked his HR and he as above 200 bpm for about 5 minutes.  He did have some slight SOB associated with this, but symptoms resolved quickly. Reviewed with Dr. Acie Fredrickson- since recent echo is ok and labs normal, he recommends that patient increase metoprolol tartrate to 50 mg 1 & 1/2 tablets (75 mg) BID. The patient is aware and agreeable. He has been advised to call back in a couple of days to let us know how his HR's are doing, but if no symptoms associated with this, the patient is ok to keep his follow up with Dr. Acie Fredrickson on 01/20/15. Per Dr. Acie Fredrickson, the patient may also need to follow up with his PCP for unknown cause of tachycardia.

## 2015-01-04 NOTE — Telephone Encounter (Signed)
PT HAVING BOUTS OF HIGH HEART RATE-HAD ECHO 01-01-15 , WANTS APPT ASAP (TODAY)  WITH [facility] IN [place] PLS [phone] PLS ADVISE  HAVE HIM PAGED AT THE [facility]

## 2015-01-07 ENCOUNTER — Telehealth: Payer: Self-pay | Admitting: *Deleted

## 2015-01-07 NOTE — Telephone Encounter (Signed)
S/w Dr Acie Fredrickson who asks for pt to come to [facility] office tomorrow for 1:45pm appt.  S/w wife, Santiago Glad, who states she will have pt here tomorrow for appt.

## 2015-01-07 NOTE — Telephone Encounter (Signed)
S/w pt wife who called regarding pt HR. States HR continues to be elevated even with increased metoprolol (75mg  BID since Aug 1) States he walked to put out trash and HR increased to 200bpm but decreased once he rested.  Per wife, pt says when HR 113-115, he feels fine but when it's lower, it "hurts"; fluctuates 60s-110s at rest and around 200bpm when doing anything.  Wife indicates pt is still working. Not a lot of lifting. She is concerned and would like him to be seen sooner than 8/17 appt for possible testing to determine cause.  Wife does not think he has followed up with PCP.  Advised pt wife to continue to monitor HR and if elevated and sustains, go to ER. Forward to [employer].

## 2015-01-07 NOTE — Telephone Encounter (Signed)
Pt wife calling stating that pt heart rate is not stable.  When he does anything his HR jumps up to 200  We just increased pt medication and pt wife thinks that he should be seen by another doctor but when I looked at our schedule it was even further out She feels as if only medication patches things up. She would like to keep dr Acie Fredrickson but would like this or him to be seen sooner.  Please advise.

## 2015-01-08 ENCOUNTER — Ambulatory Visit (INDEPENDENT_AMBULATORY_CARE_PROVIDER_SITE_OTHER): Payer: Managed Care, Other (non HMO) | Admitting: Cardiovascular Disease

## 2015-01-08 ENCOUNTER — Encounter: Payer: Self-pay | Admitting: Cardiovascular Disease

## 2015-01-08 VITALS — BP 139/84 | HR 115 | Ht 72.0 in | Wt 212.5 lb

## 2015-01-08 DIAGNOSIS — R002 Palpitations: Secondary | ICD-10-CM | POA: Diagnosis not present

## 2015-01-08 DIAGNOSIS — I4891 Unspecified atrial fibrillation: Secondary | ICD-10-CM

## 2015-01-08 MED ORDER — METOPROLOL TARTRATE 75 MG PO TABS: 75.0000 mg | ORAL_TABLET | Freq: Two times a day (BID) | ORAL | Status: DC

## 2015-01-08 MED ORDER — METOPROLOL TARTRATE 75 MG PO TABS: 50.0000 mg | ORAL_TABLET | Freq: Two times a day (BID) | ORAL | Status: DC

## 2015-01-08 NOTE — Progress Notes (Signed)
Aaron Jones Date of Birth  [birth year]       [facility] Office [address] [place], [place], [place]  [address]   [place], [place]  [address] [phone]     [phone]   Fax  [phone]    Fax [phone]  Problem List: 1. Atrial fibrillation- s/p  2. Hypertension 3. Hyperlipidemia 4. Mitral valve prolapse 5. Diabetes mellitus 6. Ventricular septal defect  - s/p closure 7. CABG - SVG to RCA   History of Present Illness:  Aaron Jones is a 69 yo with the above noted hx.  he has had a heart murmur all of his life presumably due to this ventricular septal defect. He was admitted to [facility] for congestive heart failure, rapid atrial fibrillation and was found to have a ventricular septal defect. He was transferred from [facility] to [facility]. He was seen by the surgical team and eventually had closure of his perimembranous VSD by Dr. Servando Snare.  He also had  saphenous vein grafting to his right coronary artery at that time. He also had a right and left atrial Maze procedure as well as left atrial clipping.  He had recurrent atrial fibrillation and had an cardioversion.  He's been feeling well since that time. He has been ambulating without too much difficulty.    He does have some right shoulder pain since the hospitalization.  He is asymptomatic from an A-Fib standpoint.  He cannot tell if his rhythm is regular or not.   Dec. 3, 2014:  Aaron Jones is doing well.   No CP or dyspnea.  He can no longer hear his VSD murmur. During her last visit, there is  evidence of a persistent VSD. A followup echocardiogram revealed an intact patch but with a persistent  perimembranous VSD but was still a moderate left right shunt.  The patient family states that the murmur is a lot better- he used to be able to hear  it at night when he tried to go to sleep.  He still works Teaching laboratory technician)   Dec. 4,  2015:  Aaron Jones is doing well.  No significant CP or dyspnea. Has had a fast HR for the past week .   December 23, 2014:  Still has a fast HR   January 08, 2015:  Still having fast HR on occasion  HR is 115 at baseline  He increased metoprolol to 75 BID and had a HR of 54.  He had some chest twinges and so he decreased the metoprolol back to 50 bid He has a pulse oxymeter and has episodes of 210 - 220.     Current Outpatient Prescriptions on File Prior to Visit  Medication Sig Dispense Refill  . atorvastatin (LIPITOR) 20 MG tablet Take 20 mg by mouth at bedtime.    Marland Kitchen ELIQUIS 5 MG TABS tablet TAKE 1 TABLET BY MOUTH TWICE DAILY 60 tablet 1  . furosemide (LASIX) 40 MG tablet TAKE 1 TABLET BY MOUTH EVERY DAY 30 tablet 1  . Glucose Blood (BLOOD GLUCOSE TEST STRIPS) STRP 1 strip by In Vitro route 2 (two) times daily. 100 each 0  . metFORMIN (GLUCOPHAGE-XR) 500 MG 24 hr tablet Take 500 mg by mouth 2 (two) times daily.     . metoprolol (LOPRESSOR) 50 MG tablet Take 1 tablet (50 mg total) by mouth 2 (two) times daily. 60 tablet 3  . omeprazole (  PRILOSEC) 40 MG capsule Take 40 mg by mouth daily before breakfast.     . potassium chloride SA (K-DUR,KLOR-CON) 20 MEQ tablet TAKE 1 TABLET BY MOUTH EVERY DAY 30 tablet 1   No current facility-administered medications on file prior to visit.    Allergies  Allergen Reactions  . Biaxin [Clarithromycin] Nausea Only    Past Medical History  Diagnosis Date  . GERD (gastroesophageal reflux disease)   . Hyperlipidemia   . VSD (ventricular septal defect)     a. 01/2013 s/p VSD closure (dacron patch)  . Hypertension     PCP- Kathlynn Grate, phone; [phone]  . Coronary artery disease     a. 01/2013 s/p CABG x 1 (VG->PDA) in setting of VSD repair.  . Sleep apnea     [facility]- in process of being evaluated now, states 12 yrs. ago was on CPAP but after having his deviated septum repaired, he had put the CPAP in storage. Pt. will get a new machine soon.     . Type II diabetes mellitus   . Umbilical hernia     "not repaired" (02-03-13)  . Kidney stones     "6-7 times; they passed on their own" (2013/02/03)  . Tachycardia, paroxysmal May 2013    Possible SVT  . Atrial fibrillation with rapid ventricular response 02-03-13    a. 01/2013 s/p R & L Maze and LAA clipping in setting of VSD repair;  b. On amiodarone/Apixaban;  c. 01/2013 Recurrent afib requiring DCCV.  Marland Kitchen COPD (chronic obstructive pulmonary disease)     Past Surgical History  Procedure Laterality Date  . Nasal septum surgery  2004  . Tee without cardioversion N/A 11/21/2012    Procedure: TRANSESOPHAGEAL ECHOCARDIOGRAM (TEE);  Surgeon: Jolaine Artist, MD;  Location: [facility] ENDOSCOPY;  Service: Cardiovascular;  Laterality: N/A;  . Cardiac catheterization  > 5 yr ago    Done at [facility], reportedly clean  . Cardiac catheterization  11/2012  . Multiple extractions with alveoloplasty N/A 12/11/2012    Procedure: Extraction of tooth #'s 8,4,1,6,60,63,01,60,10,93,23,55,73,22,02 wioth alveoloplasty and bialteral fibrous tuberosity reductions.;  Surgeon: Lenn Cal, DDS;  Location: [facility];  Service: Oral Surgery;  Laterality: N/A;  . Eye surgery Left 1990's    "clipped muscle so it wouldn't be pulling up" (February 03, 2013)  . Coronary artery bypass graft N/A 01/07/2013    Procedure: CORONARY ARTERY BYPASS GRAFTING (CABG);  Surgeon: Grace Isaac, MD;  Location: [place];  Service: Open Heart Surgery;  Laterality: N/A;  Times1 using endoscopically harvested saphenous vein graft to the PDA  . Maze N/A 01/07/2013    Procedure: MAZE;  Surgeon: Grace Isaac, MD;  Location: [place];  Service: Open Heart Surgery;  Laterality: N/A;  . Vsd repair N/A 01/07/2013    Procedure: VENTRICULAR SEPTAL DEFECT (VSD) REPAIR;  Surgeon: Grace Isaac, MD;  Location: [place];  Service: Open Heart Surgery;  Laterality: N/A;  . Intraoperative transesophageal echocardiogram N/A 01/07/2013    Procedure: INTRAOPERATIVE  TRANSESOPHAGEAL ECHOCARDIOGRAM;  Surgeon: Grace Isaac, MD;  Location: [place];  Service: Open Heart Surgery;  Laterality: N/A;  . Clipping of atrial appendage N/A 01/07/2013    Procedure: CLIPPING OF ATRIAL APPENDAGE;  Surgeon: Grace Isaac, MD;  Location: [place];  Service: Open Heart Surgery;  Laterality: N/A;  . Left and right heart catheterization with coronary angiogram N/A 11/20/2012    Procedure: LEFT AND RIGHT HEART CATHETERIZATION WITH CORONARY ANGIOGRAM;  Surgeon: Jolaine Artist, MD;  Location: [place] CATH LAB;  Service: Cardiovascular;  Laterality: N/A;    History  Smoking status  . Never Smoker   Smokeless tobacco  . Never Used    History  Alcohol Use No    Family History  Problem Relation Age of Onset  . Cancer Mother   . Diabetes Mother     Reviw of Systems:  Reviewed in the HPI.  All other systems are negative.  Physical Exam: Blood pressure 139/84, pulse 115, height 6' (1.829 m), weight 96.389 kg (212 lb 8 oz). General: Well developed, well nourished, in no acute distress.  Head: Normocephalic, atraumatic, sclera non-icteric, mucus membranes are moist,   Neck: Supple. Carotids are 2 + without bruits. No JVD   Lungs: Clear   Heart: His median sternotomy is healing quite nicely. There is no evidence of infection. His thoracostomy sites are healing well.   Regular rate, normal S1 and S2. He has a loud 3/6 systolic murmur at the upper left sternal border. There is a soft are 2-3/5 systolic murmur in the axilla consistent with mitral regurgitation  Abdomen: Soft, non-tender, non-distended with normal bowel sounds.  Msk:  Strength and tone are normal   Extremities: No clubbing or cyanosis. No edema.  Distal pedal pulses are 2+ and equal   Neuro: CN II - XII intact.  Alert and oriented X 3.   Psych:  Normal   ECG: December 23, 2014:  Sinus tach at 116 with short PR.  RBBB.  HR is faster, otherwise, no significant changes.   Assessment / Plan:   1. Atrial  fibrillation- s/p MAZE.    Appears to be in sinus rhythm.  I cannot exclude an atrial tach or a very slow atrial flutter - his HR seems to be stuck at 115 - even after exercising.  Does not appear to have any atrial fib.  Will be placing a monitor for further evaluation  2. Hypertension- BP is well-controlled. 3. Hyperlipidemia 4. Mitral valve prolapse - has mild - moderate MR .  5. Diabetes mellitus 6. Ventricular septal defect  - s/p closure, he still has a significant systolic murmur but that is unchanged from earlier exams.  Repeat echo card gram shows that he has normal OV function. His right side is not enlarged. Does not appear that he has a hemodynamically significant left-to-right shunt. 7. CABG - SVG to RCA 8. Sinus tachycardia:  He's had persistent tachycardia. We increased his metoprolol to 75 mg twice a day but he stated that this made his heart rate go to slow. He also they thought that it might have contributed to some heart "  twinges". He still describes heart rates in the 220 range. I reviewed his EKGs. The rhythm appears to be sinus but I cannot exclude an atrial tachycardia. I walked him around the halls for 2 laps in his heart rate did not change a bit. It remained at 114. This makes me think that he may have a atrial tachycardia. We'll increase his metoprolol back to 75 mg twice a day. We'll have him set up to see Dr. Caryl Comes for further evaluation of what I think is atrial tachycardia. We'll place a 30 day event monitor for further evaluation.   Nahser, Wonda Cheng, MD  01/08/2015 1:38 PM    [place],  [place], [place]  [address] Pager [phone] Phone: [phone]; Fax: [phone]   [facility]  [place], [place]  [address] [phone]   Fax [phone]

## 2015-01-08 NOTE — Patient Instructions (Signed)
Medication Instructions:  Your physician has recommended you make the following change in your medication:  START taking metoprolol 75mg  twice per day   Labwork: none  Testing/Procedures: Your physician has recommended that you wear an event monitor. Event monitors are medical devices that record the heart's electrical activity. Doctors most often [place] these monitors to diagnose arrhythmias. Arrhythmias are problems with the speed or rhythm of the heartbeat. The monitor is a small, portable device. You can wear one while you do your normal daily activities. This is usually used to diagnose what is causing palpitations/syncope (passing out).    Follow-Up: Your physician recommends that you schedule a follow-up appointment two months with Dr. Acie Fredrickson Your physician recommends that you schedule a follow-up appointment in 3 weeks with Dr. Caryl Comes   Any Other Special Instructions Will Be Listed Below (If Applicable).  Cardiac Event Monitoring A cardiac event monitor is a small recording device used to help detect abnormal heart rhythms (arrhythmias). The monitor is used to record heart rhythm when noticeable symptoms such as the following occur:  Fast heartbeats (palpitations), such as heart racing or fluttering.  Dizziness.  Fainting or light-headedness.  Unexplained weakness. The monitor is wired to two electrodes placed on your chest. Electrodes are flat, sticky disks that attach to your skin. The monitor can be worn for up to 30 days. You will wear the monitor at all times, except when bathing.  HOW TO USE YOUR CARDIAC EVENT MONITOR A technician will prepare your chest for the electrode placement. The technician will show you how to place the electrodes, how to work the monitor, and how to replace the batteries. Take time to practice using the monitor before you leave the office. Make sure you understand how to send the information from the monitor to your health care provider. This  requires a telephone with a landline, not a cell phone. You need to:  Wear your monitor at all times, except when you are in water:  Do not get the monitor wet.  Take the monitor off when bathing. Do not swim or use a hot tub with it on.  Keep your skin clean. Do not put body lotion or moisturizer on your chest.  Change the electrodes daily or any time they stop sticking to your skin. You might need to use tape to keep them on.  It is possible that your skin under the electrodes could become irritated. To keep this from happening, try to put the electrodes in slightly different places on your chest. However, they must remain in the area under your left breast and in the upper right section of your chest.  Make sure the monitor is safely clipped to your clothing or in a location close to your body that your health care provider recommends.  Press the button to record when you feel symptoms of heart trouble, such as dizziness, weakness, light-headedness, palpitations, thumping, shortness of breath, unexplained weakness, or a fluttering or racing heart. The monitor is always on and records what happened slightly before you pressed the button, so do not worry about being too late to get good information.  Keep a diary of your activities, such as walking, doing chores, and taking medicine. It is especially important to note what you were doing when you pushed the button to record your symptoms. This will help your health care provider determine what might be contributing to your symptoms. The information stored in your monitor will be reviewed by your health care provider alongside  your diary entries.  Send the recorded information as recommended by your health care provider. It is important to understand that it will take some time for your health care provider to process the results.  Change the batteries as recommended by your health care provider. SEEK IMMEDIATE MEDICAL CARE IF:   You have  chest pain.  You have extreme difficulty breathing or shortness of breath.  You develop a very fast heartbeat that persists.  You develop dizziness that does not go away.  You faint or constantly feel you are about to faint. Document Released: 02/29/2008 Document Revised: 10/06/2013 Document Reviewed: 11/18/2012 [facility] Patient Information 2015 [place], [place]. This information is not intended to replace advice given to you by your health care provider. Make sure you discuss any questions you have with your health care provider.

## 2015-01-12 ENCOUNTER — Telehealth: Payer: Self-pay | Admitting: *Deleted

## 2015-01-12 NOTE — Telephone Encounter (Signed)
Pt called back, states his employer needs a documentation to see if his device can work without hooking up to a computer. Please call. Pt states to ask the guard to page him.

## 2015-01-12 NOTE — Telephone Encounter (Signed)
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 Please fax ASAP to [phone] He can't go to work without this letter.

## 2015-01-12 NOTE — Telephone Encounter (Signed)
S/w pt regarding letter for work. Pt reviewed his company's concerns which I verbalized to [employer]. Aaron Jones states he will write letter and we will fax to company at number provided by pt. Pt verbalized understanding with no further questions.

## 2015-01-13 NOTE — Telephone Encounter (Signed)
S/w pt to notify him that letter is ready. Will have Ryan sign and will fax to employer. Pt verbalized understanding with no further questions.

## 2015-01-13 NOTE — Telephone Encounter (Signed)
Letter typed. Please print and I'll sign.

## 2015-01-14 ENCOUNTER — Encounter (INDEPENDENT_AMBULATORY_CARE_PROVIDER_SITE_OTHER): Payer: Managed Care, Other (non HMO)

## 2015-01-14 DIAGNOSIS — R002 Palpitations: Secondary | ICD-10-CM

## 2015-01-14 DIAGNOSIS — I4891 Unspecified atrial fibrillation: Secondary | ICD-10-CM | POA: Diagnosis not present

## 2015-01-16 ENCOUNTER — Telehealth: Payer: Self-pay | Admitting: Cardiology

## 2015-01-16 NOTE — Telephone Encounter (Signed)
Monitor company called ([employer]). Pt had one minute of rapid AF.No change in Rx  Aryianna Earwood PA-C 01/16/2015 1:19 PM

## 2015-01-18 ENCOUNTER — Telehealth: Payer: Self-pay

## 2015-01-18 NOTE — Telephone Encounter (Signed)
Received fax this morning regarding afib RVR on Saturday, 8/11.  Left VM for pt on work phone to [employer] regarding event monitor. I spoke with Dr. Acie Fredrickson and have faxed [employer] Cardiac Report to him at [facility] office.   Spoke with wife, Santiago Glad, who states they were in the car going to the school having a "discussion". States there has been a lot of stress at home recently. Pt. Father-in-law passed away three weeks ago.  Wife contacting pt to have him call us

## 2015-01-18 NOTE — Telephone Encounter (Signed)
Per verbal from Dr. Cathie Olden: pt may take extra 1/2 metoprolol when HR elevated.  S/w pt who indicates he did not feel HR elevated Saturday. States he was asymptomatic. Told him of Dr. Elmarie Shiley recommendation. Pt verbalized understanding with no further questions. Will continue to wear monitor

## 2015-01-19 IMAGING — CR DG CHEST 1V PORT
1 series · 1 of 1 positions shown · non-contrast
Comparison: none

REASON FOR EXAM: chest pain
COMMENTS:

[ap]
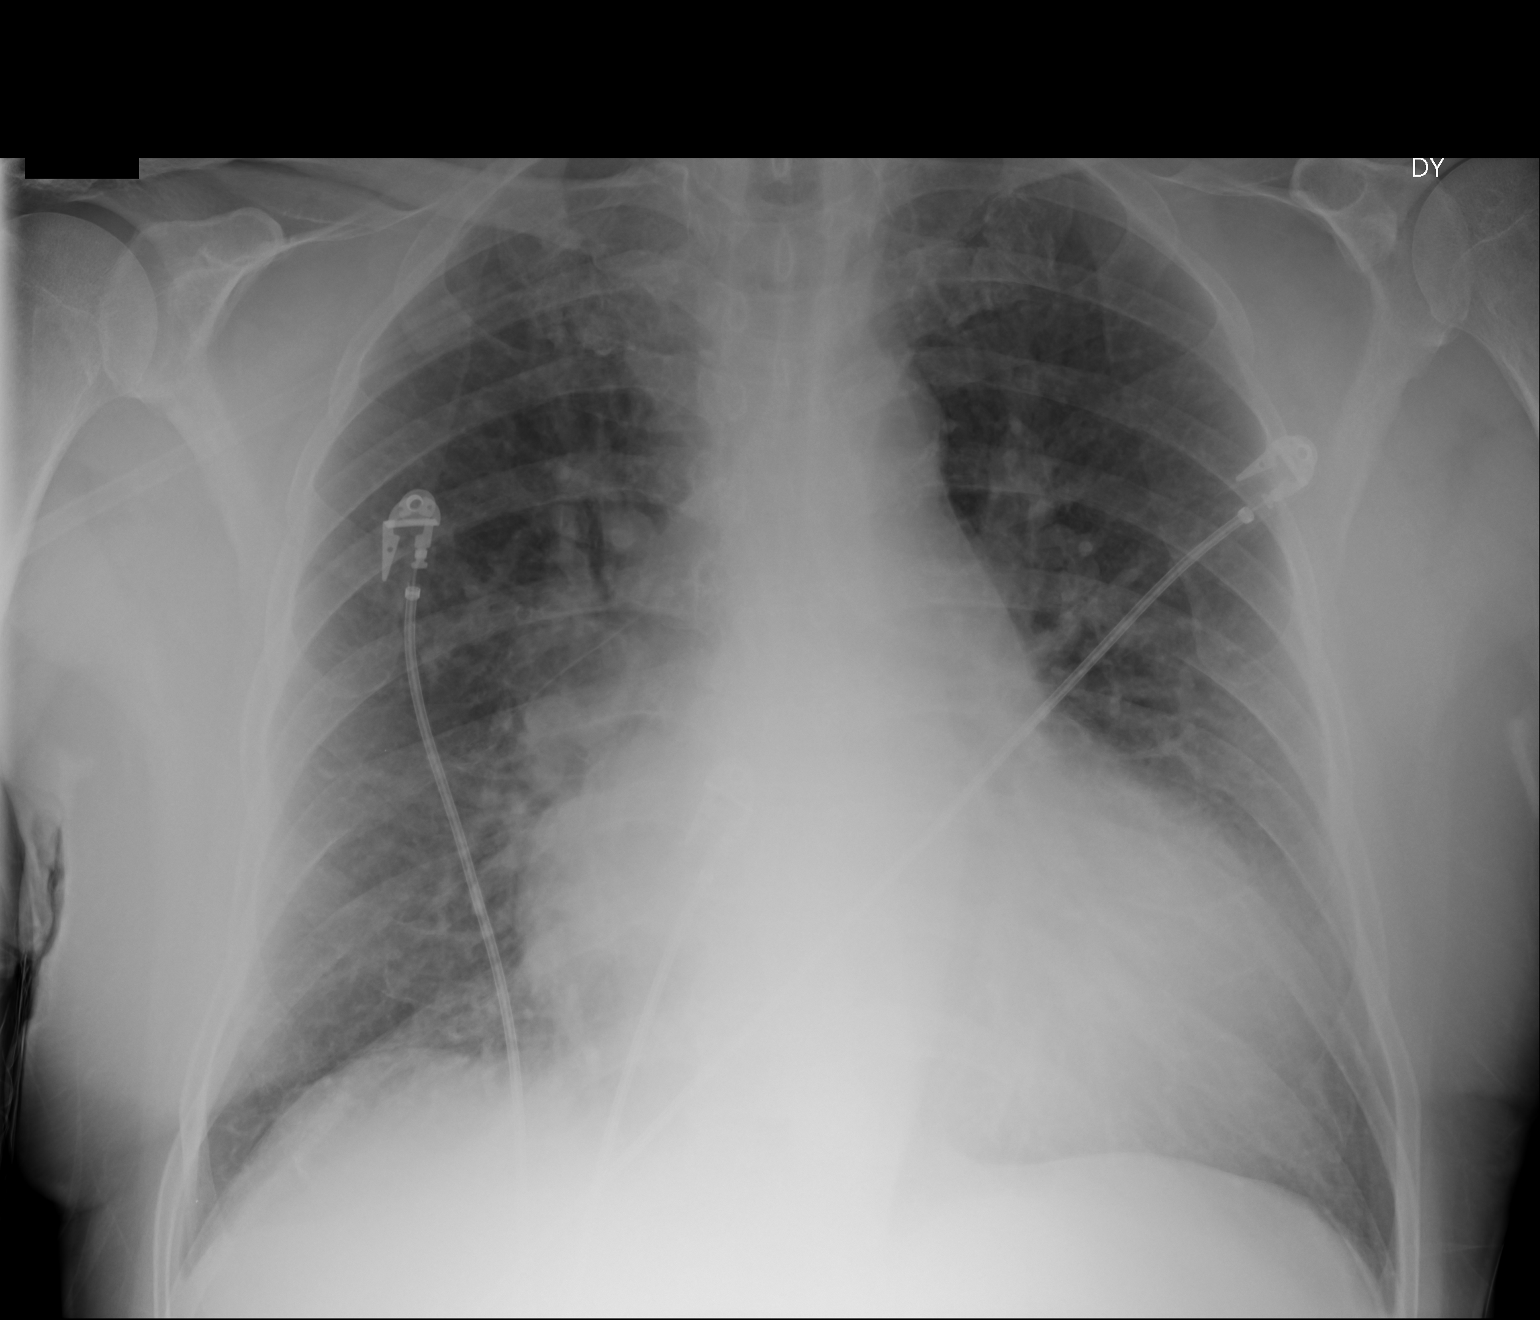

[1 of 1 positions shown; findings below may reference images not displayed]

PROCEDURE:     DXR - DXR PORTABLE CHEST SINGLE VIEW  - November 17, 2012  [DATE]

RESULT:     Comparison is made to the study of 10/28/2011. The cardiac
silhouette is enlarged. There is central pulmonary vascular engorgement with
diffuse interstitial edema. No large effusion is seen. There is no focal
consolidation or pneumothorax. The bony and mediastinal structures are
unremarkable.
IMPRESSION: 1. Cardiomegaly with central pulmonary vascular congestion and findings of
diffuse interstitial edema. Correlate for congestive heart failure.

[REDACTED]

## 2015-01-20 ENCOUNTER — Telehealth: Payer: Self-pay

## 2015-01-20 ENCOUNTER — Ambulatory Visit: Payer: Self-pay | Admitting: Cardiovascular Disease

## 2015-01-20 NOTE — Telephone Encounter (Signed)
He has paroxysmal atrial fib with RVR

## 2015-01-20 NOTE — Telephone Encounter (Signed)
Received phone call from [employer] regarding pt afib RVR.  S/w pt who indicated he was home at the time in question. States he thinks he was sitting down relaxing. States he did not feel any different or rapid heart beat. Will forward info to Dr. Acie Fredrickson

## 2015-01-24 ENCOUNTER — Telehealth: Payer: Self-pay | Admitting: Cardiology

## 2015-01-24 NOTE — Telephone Encounter (Signed)
[employer] called for abnormal EKG at 5:34pm showing SVT at 230bpm sustained for 1 minute duration but tech feels it is artifact. He had just run outside to get his dog in due to the rain and storm.   Patient complained of SOB and fatigue.  An hour later he had SVT at 150bpm.  The last strip at 8:28pm showed sinus tachycardia at 115bpm.  He says that he is feeling fine now with no complaints.  Will forward to Dr. Acie Fredrickson for further instructions.

## 2015-01-25 ENCOUNTER — Telehealth: Payer: Self-pay | Admitting: *Deleted

## 2015-01-25 NOTE — Telephone Encounter (Signed)
Talked to patient. Will increase metoprolol to 100 BID .   Has appt. With Dr. Caryl Comes in 8 days.

## 2015-01-25 NOTE — Telephone Encounter (Signed)
Pt wife calling stating that if these episodes (see previous note) then we may not want to wait the full 30 days Also when we do call back please call pt cell phone for he is out.  Please advise.

## 2015-01-26 NOTE — Telephone Encounter (Signed)
Left message for pt regarding metoprolol dosage.

## 2015-01-27 NOTE — Telephone Encounter (Signed)
S/w pt who states he has been taking 100mg  metoprolol as directed by Dr. Acie Fredrickson. States he was in the shower last night and felt short of breath. Once he got out of shower and in the cooler air, he reports feeling better. He thinks "part of what's going on is related to the heat". Will continue to wear monitor. Confirmed 8/30, 8:00am appt. Pt verbalized understanding with no further questions.

## 2015-02-02 ENCOUNTER — Telehealth: Payer: Self-pay

## 2015-02-02 ENCOUNTER — Other Ambulatory Visit: Payer: Self-pay

## 2015-02-02 ENCOUNTER — Ambulatory Visit (INDEPENDENT_AMBULATORY_CARE_PROVIDER_SITE_OTHER): Payer: Managed Care, Other (non HMO) | Admitting: Internal Medicine

## 2015-02-02 ENCOUNTER — Encounter: Payer: Self-pay | Admitting: Internal Medicine

## 2015-02-02 VITALS — BP 118/72 | HR 110 | Ht 71.0 in | Wt 213.0 lb

## 2015-02-02 DIAGNOSIS — I4891 Unspecified atrial fibrillation: Secondary | ICD-10-CM

## 2015-02-02 DIAGNOSIS — R0602 Shortness of breath: Secondary | ICD-10-CM | POA: Diagnosis not present

## 2015-02-02 DIAGNOSIS — Z01812 Encounter for preprocedural laboratory examination: Secondary | ICD-10-CM

## 2015-02-02 DIAGNOSIS — R002 Palpitations: Secondary | ICD-10-CM

## 2015-02-02 MED ORDER — METOPROLOL TARTRATE 100 MG PO TABS: 100.0000 mg | ORAL_TABLET | Freq: Two times a day (BID) | ORAL | Status: DC

## 2015-02-02 MED ORDER — METOPROLOL TARTRATE 75 MG PO TABS: 75.0000 mg | ORAL_TABLET | Freq: Two times a day (BID) | ORAL | Status: DC

## 2015-02-02 NOTE — Progress Notes (Signed)
ELECTROPHYSIOLOGY CONSULT NOTE  Patient ID: Aaron Jones, MRN: [id number], DOB/AGE: [birth year] 70 y.o. Admit date: (Not on file) Date of Consult: 02/02/2015  Primary Physician: Madelyn Brunner, MD Primary Cardiologist: PNah  Chief Complaint: Atrial arrhythmia   HPI Aaron Jones is a 69 y.o. male  Seen because of atrial arrhythmia.  He has episodes where his heart rate is detected in the 170 range; these are associated with significant dyspnea on exertion. He notes some variability in exercise tolerance. He does not have peripheral edema or nocturnal dyspnea.  Cardiac history is notable for repair of the ventricular septal defect and single-vessel CABG 2014; he also underwent MAZE procedure of the right and left atrium. He had his left atrial appendage clipped. QP QS ratio prior to surgery was 2.1. Ejection fraction was 45%.  Postoperative course was notable for atrial arrhythmias felt to be atrial fibrillation irregularity of RR intervals  He was seen 7/16 by Dr. Georg Ruddle noted a persistent atrial tachycardia clearly distinct other tachyarrhythmias   Echocardiogram 7/16 demonstrated somewhat improved LV function with an EF of 50-55%. There was evidence of residual perimembranous VSD. Mitral regurgitation was described as moderate and worse from 9/14  Past Medical History  Diagnosis Date  . GERD (gastroesophageal reflux disease)   . Hyperlipidemia   . VSD (ventricular septal defect)     a. 01/2013 s/p VSD closure (dacron patch)  . Hypertension     PCP- Kathlynn Grate, phone; [phone]  . Coronary artery disease     a. 01/2013 s/p CABG x 1 (VG->PDA) in setting of VSD repair.  . Sleep apnea     [facility]- in process of being evaluated now, states 12 yrs. ago was on CPAP but after having his deviated septum repaired, he had put the CPAP in storage. Pt. will get a new machine soon.   . Type II diabetes mellitus   . Umbilical hernia     "not repaired" (02/01/2013)  . Kidney  stones     "6-7 times; they passed on their own" (2013/02/01)  . Tachycardia, paroxysmal May 2013    Possible SVT  . Atrial fibrillation with rapid ventricular response 01-Feb-2013    a. 01/2013 s/p R & L Maze and LAA clipping in setting of VSD repair;  b. On amiodarone/Apixaban;  c. 01/2013 Recurrent afib requiring DCCV.  Marland Kitchen COPD (chronic obstructive pulmonary disease)       Surgical History:  Past Surgical History  Procedure Laterality Date  . Nasal septum surgery  2004  . Tee without cardioversion N/A 11/21/2012    Procedure: TRANSESOPHAGEAL ECHOCARDIOGRAM (TEE);  Surgeon: Jolaine Artist, MD;  Location: [facility] ENDOSCOPY;  Service: Cardiovascular;  Laterality: N/A;  . Cardiac catheterization  > 5 yr ago    Done at [facility], reportedly clean  . Cardiac catheterization  11/2012  . Multiple extractions with alveoloplasty N/A 12/11/2012    Procedure: Extraction of tooth #'s 8,6,7,6,19,50,93,26,71,24,58,09,98,33,82 wioth alveoloplasty and bialteral fibrous tuberosity reductions.;  Surgeon: Lenn Cal, DDS;  Location: [facility] ORS;  Service: Oral Surgery;  Laterality: N/A;  . Eye surgery Left 1990's    "clipped muscle so it wouldn't be pulling up" (02-01-13)  . Coronary artery bypass graft N/A 01/07/2013    Procedure: CORONARY ARTERY BYPASS GRAFTING (CABG);  Surgeon: Grace Isaac, MD;  Location: [place];  Service: Open Heart Surgery;  Laterality: N/A;  Times1 using endoscopically harvested saphenous vein graft to the PDA  . Maze N/A 01/07/2013  Procedure: MAZE;  Surgeon: Grace Isaac, MD;  Location: [place];  Service: Open Heart Surgery;  Laterality: N/A;  . Vsd repair N/A 01/07/2013    Procedure: VENTRICULAR SEPTAL DEFECT (VSD) REPAIR;  Surgeon: Grace Isaac, MD;  Location: [place];  Service: Open Heart Surgery;  Laterality: N/A;  . Intraoperative transesophageal echocardiogram N/A 01/07/2013    Procedure: INTRAOPERATIVE TRANSESOPHAGEAL ECHOCARDIOGRAM;  Surgeon: Grace Isaac, MD;   Location: [place];  Service: Open Heart Surgery;  Laterality: N/A;  . Clipping of atrial appendage N/A 01/07/2013    Procedure: CLIPPING OF ATRIAL APPENDAGE;  Surgeon: Grace Isaac, MD;  Location: [place];  Service: Open Heart Surgery;  Laterality: N/A;  . Left and right heart catheterization with coronary angiogram N/A 11/20/2012    Procedure: LEFT AND RIGHT HEART CATHETERIZATION WITH CORONARY ANGIOGRAM;  Surgeon: Jolaine Artist, MD;  Location[facility] CATH LAB;  Service: Cardiovascular;  Laterality: N/A;     Home Meds: Prior to Admission medications   Medication Sig Start Date End Date Taking? Authorizing Provider  atorvastatin (LIPITOR) 20 MG tablet Take 20 mg by mouth at bedtime.    Historical Provider, MD  ELIQUIS 5 MG TABS tablet TAKE 1 TABLET BY MOUTH TWICE DAILY    Thayer Headings, MD  furosemide (LASIX) 40 MG tablet TAKE 1 TABLET BY MOUTH EVERY DAY    Thayer Headings, MD  Glucose Blood (BLOOD GLUCOSE TEST STRIPS) STRP 1 strip by In Vitro route 2 (two) times daily. 11/25/12   Lelon Perla, MD  metFORMIN (GLUCOPHAGE-XR) 500 MG 24 hr tablet Take 500 mg by mouth 2 (two) times daily.     Historical Provider, MD  Metoprolol Tartrate 75 MG TABS Take 75 mg by mouth 2 (two) times daily. 01/08/15   Thayer Headings, MD  omeprazole (PRILOSEC) 40 MG capsule Take 40 mg by mouth daily before breakfast.     Historical Provider, MD  potassium chloride SA (K-DUR,KLOR-CON) 20 MEQ tablet TAKE 1 TABLET BY MOUTH EVERY DAY    Thayer Headings, MD      Allergies:  Allergies  Allergen Reactions  . Biaxin [Clarithromycin] Nausea Only    Social History   Social History  . Marital Status: Married    Spouse Name: N/A  . Number of Children: N/A  . 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 Smoking status: Never Smoker   . Smokeless tobacco: Never Used  . Alcohol Use: No  . Drug Use: No  . Sexual Activity: Not Currently     Other Topics Concern  . Not on file   Social History Narrative   Still works full time. Works around the yard. Lives with wife. Has 2 sisters, neither with cardiac issues.     Family History  Problem Relation Age of Onset  . Cancer Mother   . Diabetes Mother      ROS:  Please see the history of present illness.     All other systems reviewed and negative.    Physical Exam: Blood pressure 118/72, pulse 110, height 5\' 11"  (1.803 m), weight 213 lb (96.616 kg). General: Well developed, well nourished male in no acute distress. Head: Normocephalic, atraumatic, sclera non-icteric, no xanthomas, nares are without discharge. EENT: normal Lymph Nodes:  none Back: without scoliosis/kyphosis, no CVA tendersness Neck: Negative for carotid bruits. JVD 8-10 cm Lungs: Clear bilaterally  to auscultation without wheezes, rales, or rhonchi. Breathing is unlabored. Heart: RRR with S1 S2.  3/6 systolic  murmur , rubs, or gallops appreciated. Abdomen: Soft, non-tender, non-distended with normoactive bowel sounds. No hepatomegaly. No rebound/guarding. No obvious abdominal masses. Msk:  Strength and tone appear normal for age. Extremities: No clubbing or cyanosis. No edema.  Distal pedal pulses are 2+ and equal bilaterally. Skin: Warm and Dry Neuro: Alert and oriented X 3. CN III-XII intact Grossly normal sensory and motor function . Psych:  Responds to questions appropriately with a normal affect.      Labs: Cardiac Enzymes No results for input(s): CKTOTAL, CKMB, TROPONINI in the last 72 hours. CBC Lab Results  Component Value Date   WBC 7.0 12/23/2014   HGB 13.3* 02/05/2014   HCT 39.7 12/23/2014   MCV 91.0 02/05/2014   PLT 373 01/16/2013   PROTIME: No results for input(s): LABPROT, INR in the last 72 hours. Chemistry No results for input(s): NA, K, CL, CO2, BUN, CREATININE, CALCIUM, PROT, BILITOT, ALKPHOS, ALT, AST, GLUCOSE in the last 168 hours.  Invalid input(s):  LABALBU Lipids Lab Results  Component Value Date   CHOL 142 04/17/2013   HDL 41 04/17/2013   LDLCALC 76 04/17/2013   TRIG 125 04/17/2013   BNP PRO B NATRIURETIC PEPTIDE (BNP)  Date/Time Value Ref Range Status  01/16/2013 01:40 PM 2476.0* 0 - 125 pg/mL Final   Thyroid Function Tests: No results for input(s): TSH, T4TOTAL, T3FREE, THYROIDAB in the last 72 hours.  Invalid input(s): FREET3    Miscellaneous Lab Results  Component Value Date   DDIMER 0.86* 11/18/2012    Radiology/Studies:  No results found.  EKG atrial tachycardia with an atrial cycle length of 280 ms and 2-1 conduction Right bundle branch block left axis deviation  -/16/48  Axis -73  Assessment and Plan:   Atrial tachycardia  Atrial fibrillation-history  Maze operation  VSD repair with residual VSD  Coronary disease single-vessel CABG  Cardiomyopathy-mild ejection fraction 50% with moderate MR 7/16  Congestive heart failure-chronic-mixed-class IIb-IIIa   The patient has congestive heart failure in the context of persistent atrial tachycardia with 2 to one conduction. I suspect is more rapid rates are variable antegrade conduction as opposed to different arrhythmia.  His last arrhythmic events were about 2 years ago, hence, I think proceeding with cardioversion as initial therapy without adjunctive antiarrhythmics drugs is most appropriate  In the event that he has frequent recurrences of atrial arrhythmia antiarrhythmics therapy would be next option followed with a little bit less enthusiasm catheter ablation given the mixture of arrhythmias and his prior by Bilateral Maze procedure  I have reviewed with him his echocardiogram including the identification of worsening mitral regurgitation.  We will arrange for cardioversion with Dr. Georg Ruddle  We will be glad to see him again as needed    Virl Axe

## 2015-02-02 NOTE — Patient Instructions (Addendum)
Medication Instructions:  Your physician recommends that you continue on your current medications as directed. Please refer to the Current Medication list given to you today.   Labwork: Your physician recommends that you have labs today: CBC, PT/INR, BMET  Testing/Procedures: Your physician has recommended that you have a Cardioversion (DCCV). Electrical Cardioversion uses a jolt of electricity to your heart either through paddles or wired patches attached to your chest. This is a controlled, usually prescheduled, procedure. Defibrillation is done under light anesthesia in the hospital, and you usually go home the day of the procedure. This is done to get your heart back into a normal rhythm. You are not awake for the procedure. Please see the instruction sheet given to you today.  You are scheduled for a Cardioversion on  Sept 9 at 10:00 with Dr. Acie Fredrickson Please arrive at [facility] at 9:00a.m. on the day of your procedure. [place], Entrance [address] [phone]   DIET INSTRUCTIONS:  Nothing to eat or drink after midnight except your medications with a  sip of water. DO NOT TAKE LASIX OR METOPROLOL THE MORNING OF YOUR PROCEDURE.          1) Labs:  CBC, BMET, PT/INR  2) Medications:  YOU MAY TAKE ALL of your remaining medications with a small amount of water.  3) Must have a responsible person to drive you home.  4) Bring a current list of your medications and current insurance cards. You will need to come into our [place] office on  9/8 for an EKG    If you have any questions after you get home, please call the office at [phone]   Follow-Up: Your physician recommends that you schedule a follow-up appointment in:    Any Other Special Instructions Will Be Listed Below (If Applicable).  Electrical Cardioversion Electrical cardioversion is the delivery of a jolt of electricity to change the rhythm of the heart.  Sticky patches or metal paddles are placed on the chest to deliver the electricity from a device. This is done to restore a normal rhythm. A rhythm that is too fast or not regular keeps the heart from pumping well. Electrical cardioversion is done in an emergency if:   There is low or no blood pressure as a result of the heart rhythm.   Normal rhythm must be restored as fast as possible to protect the brain and heart from further damage.   It may save a life. Cardioversion may be done for heart rhythms that are not immediately life threatening, such as atrial fibrillation or flutter, in which:   The heart is beating too fast or is not regular.   Medicine to change the rhythm has not worked.   It is safe to wait in order to allow time for preparation.  Symptoms of the abnormal rhythm are bothersome.  The risk of stroke and other serious problems can be reduced. LET [facility] CARE PROVIDER KNOW ABOUT:   Any allergies you have.  All medicines you are taking, including vitamins, herbs, eye drops, creams, and over-the-counter medicines.  Previous problems you or members of your family have had with the use of anesthetics.   Any blood disorders you have.   Previous surgeries you have had.   Medical conditions you have. RISKS AND COMPLICATIONS  Generally, this is a safe procedure. However, problems can occur and include:   Breathing problems related to the anesthetic used.  A blood clot  that breaks free and travels to other parts of your body. This could cause a stroke or other problems. The risk of this is lowered by use of blood-thinning medicine (anticoagulant) prior to the procedure.  Cardiac arrest (rare). BEFORE THE PROCEDURE   You may have tests to detect blood clots in your heart and to evaluate heart function.  You may start taking anticoagulants so your blood does not clot as easily.   Medicines may be given to help stabilize your heart rate and  rhythm. PROCEDURE  You will be given medicine through an IV tube to reduce discomfort and make you sleepy (sedative).   An electrical shock will be delivered. AFTER THE PROCEDURE Your heart rhythm will be watched to make sure it does not change.  Document Released: 05/12/2002 Document Revised: 10/06/2013 Document Reviewed: 12/04/2012 [facility] Patient Information 2015 [place], [place]. This information is not intended to replace advice given to you by your health care provider. Make sure you discuss any questions you have with your health care provider.

## 2015-02-02 NOTE — Telephone Encounter (Signed)
S/w pt to confirm DCCV and EKG date and time  DCCV at [facility], 9/9 10:00, arrive 9:00 EKG [place] 9/8, 4pm  Metoprolol refill submitted

## 2015-02-03 ENCOUNTER — Other Ambulatory Visit: Payer: Self-pay

## 2015-02-03 DIAGNOSIS — I4891 Unspecified atrial fibrillation: Secondary | ICD-10-CM

## 2015-02-03 LAB — CBC
HEMATOCRIT: 42.6 % (ref 37.5–51.0)
HEMOGLOBIN: 14.1 g/dL (ref 12.6–17.7)
MCH: 29.7 pg (ref 26.6–33.0)
MCHC: 33.1 g/dL (ref 31.5–35.7)
MCV: 90 fL (ref 79–97)
Platelets: 250 10*3/uL (ref 150–379)
RBC: 4.75 x10E6/uL (ref 4.14–5.80)
RDW: 12.8 % (ref 12.3–15.4)
WBC: 7.7 10*3/uL (ref 3.4–10.8)

## 2015-02-03 LAB — BASIC METABOLIC PANEL
BUN / CREAT RATIO: 15 (ref 10–22)
BUN: 15 mg/dL (ref 8–27)
CALCIUM: 9.2 mg/dL (ref 8.6–10.2)
CHLORIDE: 98 mmol/L (ref 97–108)
CO2: 23 mmol/L (ref 18–29)
CREATININE: 0.97 mg/dL (ref 0.76–1.27)
GFR calc non Af Amer: 80 mL/min/{1.73_m2} (ref >59)
GFR, EST AFRICAN AMERICAN: 93 mL/min/{1.73_m2} (ref >59)
Glucose: 262 mg/dL — ABNORMAL HIGH (ref 65–99)
Potassium: 5.3 mmol/L — ABNORMAL HIGH (ref 3.5–5.2)
Sodium: 138 mmol/L (ref 134–144)

## 2015-02-03 LAB — PROTIME-INR
INR: 1.1 (ref 0.8–1.2)
PROTHROMBIN TIME: 11.5 s (ref 9.1–12.0)

## 2015-02-09 ENCOUNTER — Other Ambulatory Visit (INDEPENDENT_AMBULATORY_CARE_PROVIDER_SITE_OTHER): Payer: Managed Care, Other (non HMO) | Admitting: *Deleted

## 2015-02-09 DIAGNOSIS — I4891 Unspecified atrial fibrillation: Secondary | ICD-10-CM

## 2015-02-10 ENCOUNTER — Other Ambulatory Visit: Payer: Self-pay | Admitting: *Deleted

## 2015-02-10 DIAGNOSIS — R002 Palpitations: Secondary | ICD-10-CM

## 2015-02-10 DIAGNOSIS — I4891 Unspecified atrial fibrillation: Secondary | ICD-10-CM

## 2015-02-10 LAB — POTASSIUM: POTASSIUM: 4.2 mmol/L (ref 3.5–5.2)

## 2015-02-11 ENCOUNTER — Ambulatory Visit (INDEPENDENT_AMBULATORY_CARE_PROVIDER_SITE_OTHER): Payer: Managed Care, Other (non HMO)

## 2015-02-11 DIAGNOSIS — I4891 Unspecified atrial fibrillation: Secondary | ICD-10-CM

## 2015-02-11 NOTE — Patient Instructions (Signed)
1.) Reason for visit: EKG prior to DCCV  2.) Name of MD requesting visit: Caryl Comes  3.) H&P: Afib RVR  4.) ROS related to problem: Pt scheduled for DCCV tomorrow at [facility]. EKG today to confirm rhythm  5.) Assessment and plan per MD: EKG given and reviewed by Christell Faith.  Reviewed pre-procedure instructions with pt who verbalized understanding

## 2015-02-12 ENCOUNTER — Encounter (HOSPITAL_COMMUNITY): Payer: Self-pay | Admitting: *Deleted

## 2015-02-12 ENCOUNTER — Ambulatory Visit (HOSPITAL_COMMUNITY): Payer: Managed Care, Other (non HMO) | Admitting: Certified Registered Nurse Anesthetist

## 2015-02-12 ENCOUNTER — Encounter (HOSPITAL_COMMUNITY)
Admission: RE | Disposition: A | Discharge status: Home / Self Care | Payer: Self-pay | Source: Ambulatory Visit | Attending: Cardiovascular Disease

## 2015-02-12 ENCOUNTER — Ambulatory Visit (HOSPITAL_COMMUNITY)
Admission: RE | Admit: 2015-02-12 | Discharge: 2015-02-12 | Disposition: A | Discharge status: Home / Self Care | Payer: Managed Care, Other (non HMO) | Source: Ambulatory Visit | Attending: Cardiovascular Disease | Admitting: Cardiovascular Disease

## 2015-02-12 DIAGNOSIS — I4891 Unspecified atrial fibrillation: Secondary | ICD-10-CM | POA: Insufficient documentation

## 2015-02-12 DIAGNOSIS — Z951 Presence of aortocoronary bypass graft: Secondary | ICD-10-CM | POA: Diagnosis not present

## 2015-02-12 DIAGNOSIS — G4730 Sleep apnea, unspecified: Secondary | ICD-10-CM | POA: Insufficient documentation

## 2015-02-12 DIAGNOSIS — Q210 Ventricular septal defect: Secondary | ICD-10-CM | POA: Diagnosis not present

## 2015-02-12 DIAGNOSIS — I4892 Unspecified atrial flutter: Secondary | ICD-10-CM | POA: Diagnosis not present

## 2015-02-12 DIAGNOSIS — Z7901 Long term (current) use of anticoagulants: Secondary | ICD-10-CM | POA: Diagnosis not present

## 2015-02-12 DIAGNOSIS — Z79899 Other long term (current) drug therapy: Secondary | ICD-10-CM | POA: Insufficient documentation

## 2015-02-12 DIAGNOSIS — K219 Gastro-esophageal reflux disease without esophagitis: Secondary | ICD-10-CM | POA: Insufficient documentation

## 2015-02-12 DIAGNOSIS — J449 Chronic obstructive pulmonary disease, unspecified: Secondary | ICD-10-CM | POA: Diagnosis not present

## 2015-02-12 DIAGNOSIS — I10 Essential (primary) hypertension: Secondary | ICD-10-CM | POA: Diagnosis not present

## 2015-02-12 DIAGNOSIS — E119 Type 2 diabetes mellitus without complications: Secondary | ICD-10-CM | POA: Diagnosis not present

## 2015-02-12 DIAGNOSIS — R0602 Shortness of breath: Secondary | ICD-10-CM

## 2015-02-12 DIAGNOSIS — I499 Cardiac arrhythmia, unspecified: Secondary | ICD-10-CM | POA: Diagnosis present

## 2015-02-12 DIAGNOSIS — E785 Hyperlipidemia, unspecified: Secondary | ICD-10-CM | POA: Insufficient documentation

## 2015-02-12 DIAGNOSIS — I340 Nonrheumatic mitral (valve) insufficiency: Secondary | ICD-10-CM | POA: Insufficient documentation

## 2015-02-12 DIAGNOSIS — I2510 Atherosclerotic heart disease of native coronary artery without angina pectoris: Secondary | ICD-10-CM | POA: Insufficient documentation

## 2015-02-12 DIAGNOSIS — Z01812 Encounter for preprocedural laboratory examination: Secondary | ICD-10-CM

## 2015-02-12 HISTORY — PX: CARDIOVERSION: SHX1299

## 2015-02-12 LAB — GLUCOSE, CAPILLARY: Glucose-Capillary: 256 mg/dL — ABNORMAL HIGH (ref 65–99)

## 2015-02-12 SURGERY — CARDIOVERSION
Anesthesia: General

## 2015-02-12 MED ORDER — LIDOCAINE HCL (CARDIAC) 20 MG/ML IV SOLN
INTRAVENOUS | Status: DC | PRN
  Administered 2015-02-12: 40 mg via INTRAVENOUS

## 2015-02-12 MED ORDER — SODIUM CHLORIDE 0.9 % IV SOLN
INTRAVENOUS | Status: DC
  Administered 2015-02-12: 10:00:00 via INTRAVENOUS
  Administered 2015-02-12: 500 mL via INTRAVENOUS

## 2015-02-12 MED ORDER — PROPOFOL 10 MG/ML IV BOLUS
INTRAVENOUS | Status: DC | PRN
  Administered 2015-02-12 (×2): 50 mg via INTRAVENOUS

## 2015-02-12 NOTE — Interval H&P Note (Signed)
History and Physical Interval Note:  02/12/2015 10:12 AM  Aaron Jones  has presented today for surgery, with the diagnosis of afib  The various methods of treatment have been discussed with the patient and family. After consideration of risks, benefits and other options for treatment, the patient has consented to  Procedure(s): CARDIOVERSION (N/A) as a surgical intervention .  The patient's history has been reviewed, patient examined, no change in status, stable for surgery.  I have reviewed the patient's chart and labs.  Questions were answered to the patient's satisfaction.     Saleemah Mollenhauer, Wonda Cheng

## 2015-02-12 NOTE — Transfer of Care (Signed)
Immediate Anesthesia Transfer of Care Note  Patient: Aaron Jones  Procedure(s) Performed: Procedure(s): CARDIOVERSION (N/A)  Patient Location: Endoscopy Unit  Anesthesia Type:General  Level of Consciousness: awake, alert  and oriented  Airway & Oxygen Therapy: Patient Spontanous Breathing and Patient connected to nasal cannula oxygen  Post-op Assessment: Report given to RN and Post -op Vital signs reviewed and stable  Post vital signs: Reviewed and stable  Last Vitals:  Filed Vitals:   02/12/15 0859  BP: 154/95  Temp: 36.5 C  Resp: 16    Complications: No apparent anesthesia complications

## 2015-02-12 NOTE — H&P (View-Only) (Signed)
ELECTROPHYSIOLOGY CONSULT NOTE  Patient ID: Aaron Jones, MRN: [id number], DOB/AGE: 67/25/1947 67 y.o. Admit date: (Not on file) Date of Consult: 02/02/2015  Primary Physician: Madelyn Brunner, MD Primary Cardiologist: PNah  Chief Complaint: Atrial arrhythmia   HPI Aaron Jones is a 69 y.o. male  Seen because of atrial arrhythmia.  He has episodes where his heart rate is detected in the 170 range; these are associated with significant dyspnea on exertion. He notes some variability in exercise tolerance. He does not have peripheral edema or nocturnal dyspnea.  Cardiac history is notable for repair of the ventricular septal defect and single-vessel CABG 2014; he also underwent MAZE procedure of the right and left atrium. He had his left atrial appendage clipped. QP QS ratio prior to surgery was 2.1. Ejection fraction was 45%.  Postoperative course was notable for atrial arrhythmias felt to be atrial fibrillation irregularity of RR intervals  He was seen 7/16 by Dr. Georg Ruddle noted a persistent atrial tachycardia clearly distinct other tachyarrhythmias   Echocardiogram 7/16 demonstrated somewhat improved LV function with an EF of 50-55%. There was evidence of residual perimembranous VSD. Mitral regurgitation was described as moderate and worse from 9/14  Past Medical History  Diagnosis Date  . GERD (gastroesophageal reflux disease)   . Hyperlipidemia   . VSD (ventricular septal defect)     a. 01/2013 s/p VSD closure (dacron patch)  . Hypertension     PCP- Kathlynn Grate, phone; [phone]  . Coronary artery disease     a. 01/2013 s/p CABG x 1 (VG->PDA) in setting of VSD repair.  . Sleep apnea     [facility]- in process of being evaluated now, states 12 yrs. ago was on CPAP but after having his deviated septum repaired, he had put the CPAP in storage. Pt. will get a new machine soon.   . Type II diabetes mellitus   . Umbilical hernia     "not repaired" (01/24/13)  . Kidney  stones     "6-7 times; they passed on their own" (January 24, 2013)  . Tachycardia, paroxysmal May 2013    Possible SVT  . Atrial fibrillation with rapid ventricular response 24-Jan-2013    a. 01/2013 s/p R & L Maze and LAA clipping in setting of VSD repair;  b. On amiodarone/Apixaban;  c. 01/2013 Recurrent afib requiring DCCV.  Marland Kitchen COPD (chronic obstructive pulmonary disease)       Surgical History:  Past Surgical History  Procedure Laterality Date  . Nasal septum surgery  2004  . Tee without cardioversion N/A 11/21/2012    Procedure: TRANSESOPHAGEAL ECHOCARDIOGRAM (TEE);  Surgeon: Jolaine Artist, MD;  Location: [facility] ENDOSCOPY;  Service: Cardiovascular;  Laterality: N/A;  . Cardiac catheterization  > 5 yr ago    Done at [facility], reportedly clean  . Cardiac catheterization  11/2012  . Multiple extractions with alveoloplasty N/A 12/11/2012    Procedure: Extraction of tooth #'s 6,4,3,3,29,51,88,41,66,06,30,16,01,09,32 wioth alveoloplasty and bialteral fibrous tuberosity reductions.;  Surgeon: Lenn Cal, DDS;  Location: [facility] ORS;  Service: Oral Surgery;  Laterality: N/A;  . Eye surgery Left 1990's    "clipped muscle so it wouldn't be pulling up" (2013/01/24)  . Coronary artery bypass graft N/A 01/07/2013    Procedure: CORONARY ARTERY BYPASS GRAFTING (CABG);  Surgeon: Grace Isaac, MD;  Location: [place];  Service: Open Heart Surgery;  Laterality: N/A;  Times1 using endoscopically harvested saphenous vein graft to the PDA  . Maze N/A 01/07/2013  Procedure: MAZE;  Surgeon: Grace Isaac, MD;  Location: [place];  Service: Open Heart Surgery;  Laterality: N/A;  . Vsd repair N/A 01/07/2013    Procedure: VENTRICULAR SEPTAL DEFECT (VSD) REPAIR;  Surgeon: Grace Isaac, MD;  Location: [place];  Service: Open Heart Surgery;  Laterality: N/A;  . Intraoperative transesophageal echocardiogram N/A 01/07/2013    Procedure: INTRAOPERATIVE TRANSESOPHAGEAL ECHOCARDIOGRAM;  Surgeon: Grace Isaac, MD;   Location: [place];  Service: Open Heart Surgery;  Laterality: N/A;  . Clipping of atrial appendage N/A 01/07/2013    Procedure: CLIPPING OF ATRIAL APPENDAGE;  Surgeon: Grace Isaac, MD;  Location: [place];  Service: Open Heart Surgery;  Laterality: N/A;  . Left and right heart catheterization with coronary angiogram N/A 11/20/2012    Procedure: LEFT AND RIGHT HEART CATHETERIZATION WITH CORONARY ANGIOGRAM;  Surgeon: Jolaine Artist, MD;  Location: [facility] CATH LAB;  Service: Cardiovascular;  Laterality: N/A;     Home Meds: Prior to Admission medications   Medication Sig Start Date End Date Taking? Authorizing Provider  atorvastatin (LIPITOR) 20 MG tablet Take 20 mg by mouth at bedtime.    Historical Provider, MD  ELIQUIS 5 MG TABS tablet TAKE 1 TABLET BY MOUTH TWICE DAILY    Thayer Headings, MD  furosemide (LASIX) 40 MG tablet TAKE 1 TABLET BY MOUTH EVERY DAY    Thayer Headings, MD  Glucose Blood (BLOOD GLUCOSE TEST STRIPS) STRP 1 strip by In Vitro route 2 (two) times daily. 11/25/12   Lelon Perla, MD  metFORMIN (GLUCOPHAGE-XR) 500 MG 24 hr tablet Take 500 mg by mouth 2 (two) times daily.     Historical Provider, MD  Metoprolol Tartrate 75 MG TABS Take 75 mg by mouth 2 (two) times daily. 01/08/15   Thayer Headings, MD  omeprazole (PRILOSEC) 40 MG capsule Take 40 mg by mouth daily before breakfast.     Historical Provider, MD  potassium chloride SA (K-DUR,KLOR-CON) 20 MEQ tablet TAKE 1 TABLET BY MOUTH EVERY DAY    Thayer Headings, MD      Allergies:  Allergies  Allergen Reactions  . Biaxin [Clarithromycin] Nausea Only    Social History   Social History  . Marital Status: Married    Spouse Name: N/A  . Number of Children: N/A  . 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 Smoking status: Never Smoker   . Smokeless tobacco: Never Used  . Alcohol Use: No  . Drug Use: No  . Sexual Activity: Not Currently     Other Topics Concern  . Not on file   Social History Narrative   Still works full time. Works around the yard. Lives with wife. Has 2 sisters, neither with cardiac issues.     Family History  Problem Relation Age of Onset  . Cancer Mother   . Diabetes Mother      ROS:  Please see the history of present illness.     All other systems reviewed and negative.    Physical Exam: Blood pressure 118/72, pulse 110, height 5\' 11"  (1.803 m), weight 213 lb (96.616 kg). General: Well developed, well nourished male in no acute distress. Head: Normocephalic, atraumatic, sclera non-icteric, no xanthomas, nares are without discharge. EENT: normal Lymph Nodes:  none Back: without scoliosis/kyphosis, no CVA tendersness Neck: Negative for carotid bruits. JVD 8-10 cm Lungs: Clear bilaterally  to auscultation without wheezes, rales, or rhonchi. Breathing is unlabored. Heart: RRR with S1 S2.  3/6 systolic  murmur , rubs, or gallops appreciated. Abdomen: Soft, non-tender, non-distended with normoactive bowel sounds. No hepatomegaly. No rebound/guarding. No obvious abdominal masses. Msk:  Strength and tone appear normal for age. Extremities: No clubbing or cyanosis. No edema.  Distal pedal pulses are 2+ and equal bilaterally. Skin: Warm and Dry Neuro: Alert and oriented X 3. CN III-XII intact Grossly normal sensory and motor function . Psych:  Responds to questions appropriately with a normal affect.      Labs: Cardiac Enzymes No results for input(s): CKTOTAL, CKMB, TROPONINI in the last 72 hours. CBC Lab Results  Component Value Date   WBC 7.0 12/23/2014   HGB 13.3* 02/05/2014   HCT 39.7 12/23/2014   MCV 91.0 02/05/2014   PLT 373 01/16/2013   PROTIME: No results for input(s): LABPROT, INR in the last 72 hours. Chemistry No results for input(s): NA, K, CL, CO2, BUN, CREATININE, CALCIUM, PROT, BILITOT, ALKPHOS, ALT, AST, GLUCOSE in the last 168 hours.  Invalid input(s):  LABALBU Lipids Lab Results  Component Value Date   CHOL 142 04/17/2013   HDL 41 04/17/2013   LDLCALC 76 04/17/2013   TRIG 125 04/17/2013   BNP PRO B NATRIURETIC PEPTIDE (BNP)  Date/Time Value Ref Range Status  01/16/2013 01:40 PM 2476.0* 0 - 125 pg/mL Final   Thyroid Function Tests: No results for input(s): TSH, T4TOTAL, T3FREE, THYROIDAB in the last 72 hours.  Invalid input(s): FREET3    Miscellaneous Lab Results  Component Value Date   DDIMER 0.86* 11/18/2012    Radiology/Studies:  No results found.  EKG atrial tachycardia with an atrial cycle length of 280 ms and 2-1 conduction Right bundle branch block left axis deviation  -/16/48  Axis -73  Assessment and Plan:   Atrial tachycardia  Atrial fibrillation-history  Maze operation  VSD repair with residual VSD  Coronary disease single-vessel CABG  Cardiomyopathy-mild ejection fraction 50% with moderate MR 7/16  Congestive heart failure-chronic-mixed-class IIb-IIIa   The patient has congestive heart failure in the context of persistent atrial tachycardia with 2 to one conduction. I suspect is more rapid rates are variable antegrade conduction as opposed to different arrhythmia.  His last arrhythmic events were about 2 years ago, hence, I think proceeding with cardioversion as initial therapy without adjunctive antiarrhythmics drugs is most appropriate  In the event that he has frequent recurrences of atrial arrhythmia antiarrhythmics therapy would be next option followed with a little bit less enthusiasm catheter ablation given the mixture of arrhythmias and his prior by Bilateral Maze procedure  I have reviewed with him his echocardiogram including the identification of worsening mitral regurgitation.  We will arrange for cardioversion with Dr. Georg Ruddle  We will be glad to see him again as needed    Virl Axe

## 2015-02-12 NOTE — Anesthesia Preprocedure Evaluation (Addendum)
Anesthesia Evaluation  Patient identified by MRN, date of birth, ID band Patient awake    Reviewed: Allergy & Precautions, NPO status , Patient's Chart, lab work & pertinent test results, reviewed documented beta blocker date and time   History of Anesthesia Complications Negative for: history of anesthetic complications  Airway Mallampati: II  TM Distance: >3 FB Neck ROM: Full    Dental  (+) Edentulous Upper, Edentulous Lower   Pulmonary sleep apnea , COPD,    breath sounds clear to auscultation       Cardiovascular hypertension, Pt. on home beta blockers + CAD  + dysrhythmias Atrial Fibrillation + Valvular Problems/Murmurs  Rhythm:Irregular     Neuro/Psych negative neurological ROS  negative psych ROS   GI/Hepatic Neg liver ROS, GERD  Controlled and Medicated,  Endo/Other  diabetes, Type 2, Oral Hypoglycemic Agents  Renal/GU negative Renal ROS     Musculoskeletal   Abdominal   Peds  Hematology   Anesthesia Other Findings   Reproductive/Obstetrics                            Anesthesia Physical Anesthesia Plan  ASA: III  Anesthesia Plan: General   Post-op Pain Management:    Induction: Intravenous  Airway Management Planned: Mask  Additional Equipment: None  Intra-op Plan:   Post-operative Plan:   Informed Consent: I have reviewed the patients History and Physical, chart, labs and discussed the procedure including the risks, benefits and alternatives for the proposed anesthesia with the patient or authorized representative who has indicated his/her understanding and acceptance.     Plan Discussed with: Surgeon and CRNA  Anesthesia Plan Comments:        Anesthesia Quick Evaluation

## 2015-02-12 NOTE — Anesthesia Postprocedure Evaluation (Signed)
  Anesthesia Post-op Note  Patient: Aaron Jones  Procedure(s) Performed: Procedure(s): CARDIOVERSION (N/A)  Patient Location: Endoscopy Unit  Anesthesia Type:General  Level of Consciousness: awake, alert  and oriented  Airway and Oxygen Therapy: Patient Spontanous Breathing and Patient connected to nasal cannula oxygen  Post-op Pain: none  Post-op Assessment: Post-op Vital signs reviewed, Patient's Cardiovascular Status Stable, Respiratory Function Stable, Patent Airway and No signs of Nausea or vomiting              Post-op Vital Signs: Reviewed and stable  Last Vitals:  Filed Vitals:   02/12/15 0859  BP: 154/95  Temp: 36.5 C  Resp: 16    Complications: No apparent anesthesia complications

## 2015-02-12 NOTE — Discharge Instructions (Signed)
Electrical Cardioversion, Care After °Refer to this sheet in the next few weeks. These instructions provide you with information on caring for yourself after your procedure. Your health care provider may also give you more specific instructions. Your treatment has been planned according to current medical practices, but problems sometimes occur. Call your health care provider if you have any problems or questions after your procedure. °WHAT TO EXPECT AFTER THE PROCEDURE °After your procedure, it is typical to have the following sensations: °· Some redness on the skin where the shocks were delivered. If this is tender, a sunburn lotion or hydrocortisone cream may help. °· Possible return of an abnormal heart rhythm within hours or days after the procedure. °HOME CARE INSTRUCTIONS °· Take medicines only as directed by your health care provider. Be sure you understand how and when to take your medicine. °· Learn how to feel your pulse and check it often. °· Limit your activity for 48 hours after the procedure or as directed by your health care provider. °· Avoid or minimize caffeine and other stimulants as directed by your health care provider. °SEEK MEDICAL CARE IF: °· You feel like your heart is beating too fast or your pulse is not regular. °· You have any questions about your medicines. °· You have bleeding that will not stop. °SEEK IMMEDIATE MEDICAL CARE IF: °· You are dizzy or feel faint. °· It is hard to breathe or you feel short of breath. °· There is a change in discomfort in your chest. °· Your speech is slurred or you have trouble moving an arm or leg on one side of your body. °· You get a serious muscle cramp that does not go away. °· Your fingers or toes turn cold or blue. °Document Released: 03/12/2013 Document Revised: 10/06/2013 Document Reviewed: 03/12/2013 °ExitCare® Patient Information ©2015 [employer]. This information is not intended to replace advice given to you by your health care provider.  Make sure you discuss any questions you have with your health care provider. ° ° °Conscious Sedation, Adult, Care After °Refer to this sheet in the next few weeks. These instructions provide you with information on caring for yourself after your procedure. Your health care provider may also give you more specific instructions. Your treatment has been planned according to current medical practices, but problems sometimes occur. Call your health care provider if you have any problems or questions after your procedure. °WHAT TO EXPECT AFTER THE PROCEDURE  °After your procedure: °· You may feel sleepy, clumsy, and have poor balance for several hours. °· Vomiting may occur if you eat too soon after the procedure. °HOME CARE INSTRUCTIONS °· Do not participate in any activities where you could become injured for at least 24 hours. Do not: °¨ Drive. °¨ Swim. °¨ Ride a bicycle. °¨ Operate heavy machinery. °¨ Cook. °¨ Use power tools. °¨ Climb ladders. °¨ Work from a high place. °· Do not make important decisions or sign legal documents until you are improved. °· If you vomit, drink water, juice, or soup when you can drink without vomiting. Make sure you have little or no nausea before eating solid foods. °· Only take over-the-counter or prescription medicines for pain, discomfort, or fever as directed by your health care provider. °· Make sure you and your family fully understand everything about the medicines given to you, including what side effects may occur. °· You should not drink alcohol, take sleeping pills, or take medicines that cause drowsiness for at least 24   hours. °· If you smoke, do not smoke without supervision. °· If you are feeling better, you may resume normal activities 24 hours after you were sedated. °· Keep all appointments with your health care provider. °SEEK MEDICAL CARE IF: °· Your skin is pale or bluish in color. °· You continue to feel nauseous or vomit. °· Your pain is getting worse and is not  helped by medicine. °· You have bleeding or swelling. °· You are still sleepy or feeling clumsy after 24 hours. °SEEK IMMEDIATE MEDICAL CARE IF: °· You develop a rash. °· You have difficulty breathing. °· You develop any type of allergic problem. °· You have a fever. °MAKE SURE YOU: °· Understand these instructions. °· Will watch your condition. °· Will get help right away if you are not doing well or get worse. °Document Released: 03/12/2013 Document Reviewed: 03/12/2013 °ExitCare® Patient Information ©2015 [employer]. This information is not intended to replace advice given to you by your health care provider. Make sure you discuss any questions you have with your health care provider. ° °

## 2015-02-12 NOTE — CV Procedure (Signed)
    Cardioversion Note  Aaron Jones [id number] [birth year]  Procedure: DC Cardioversion Indications: atrial flutter   Procedure Details Consent: Obtained Time Out: Verified patient identification, verified procedure, site/side was marked, verified correct patient position, special equipment/implants available, Radiology Safety Procedures followed,  medications/allergies/relevent history reviewed, required imaging and test results available.  Performed  The patient has been on adequate anticoagulation.  The patient received  Lidocaine 40 mg IV Propofol 100 mg iv  for sedation.  Synchronous cardioversion was performed at 50 joules.  The cardioversion was successful     Complications: No apparent complications Patient did tolerate procedure well.   Thayer Headings, Brooke Bonito., MD, [facility] 02/12/2015, 10:23 AM

## 2015-02-16 ENCOUNTER — Encounter (HOSPITAL_COMMUNITY): Payer: Self-pay | Admitting: Cardiovascular Disease

## 2015-03-11 IMAGING — CR DG CHEST 1V PORT
1 series · 1 of 1 positions shown · non-contrast
Comparison: Chest radiograph 01/03/2013

CLINICAL DATA: Status post CABG, VSD.

PORTABLE CHEST - 1 VIEW

[AP]
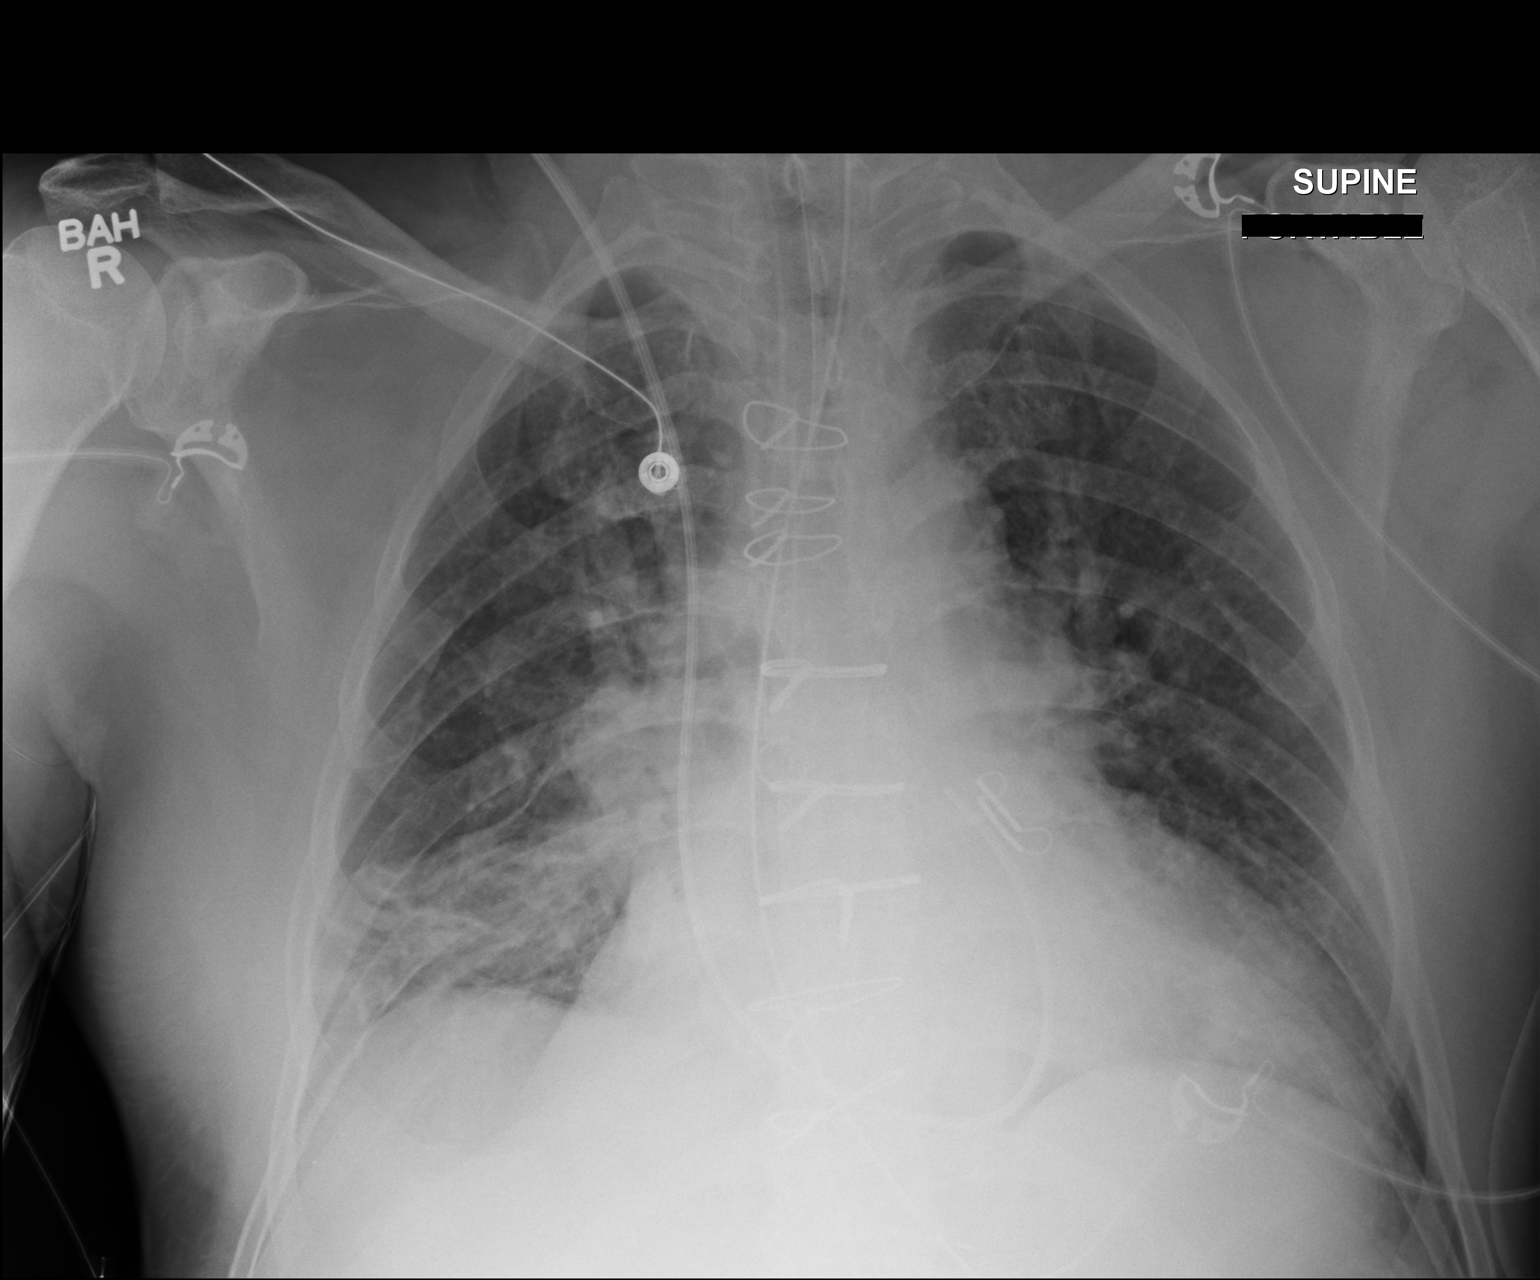

[1 of 1 positions shown; findings below may reference images not displayed]

FINDINGS: Right IJ approach catheter is present with tip projecting
in the expected location of the main pulmonary artery.  The ET tube
is present tip terminating 4 cm superior to the carina.  Status
post median sternotomy.  Cardiomegaly.  Heterogeneous opacities
within the right lung base likely represent atelectasis.  No large
pleural effusion or pneumothorax.
IMPRESSION: 1.  ET tube terminates 4 cm superior to the carina.
2.  Heterogeneous opacities right lung base likely represent
atelectasis.

## 2015-03-12 IMAGING — CR DG CHEST 1V PORT
1 series · 1 of 1 positions shown · non-contrast
Comparison: 01/07/2013.

CLINICAL DATA: CABG.

PORTABLE CHEST - 1 VIEW

[AP]
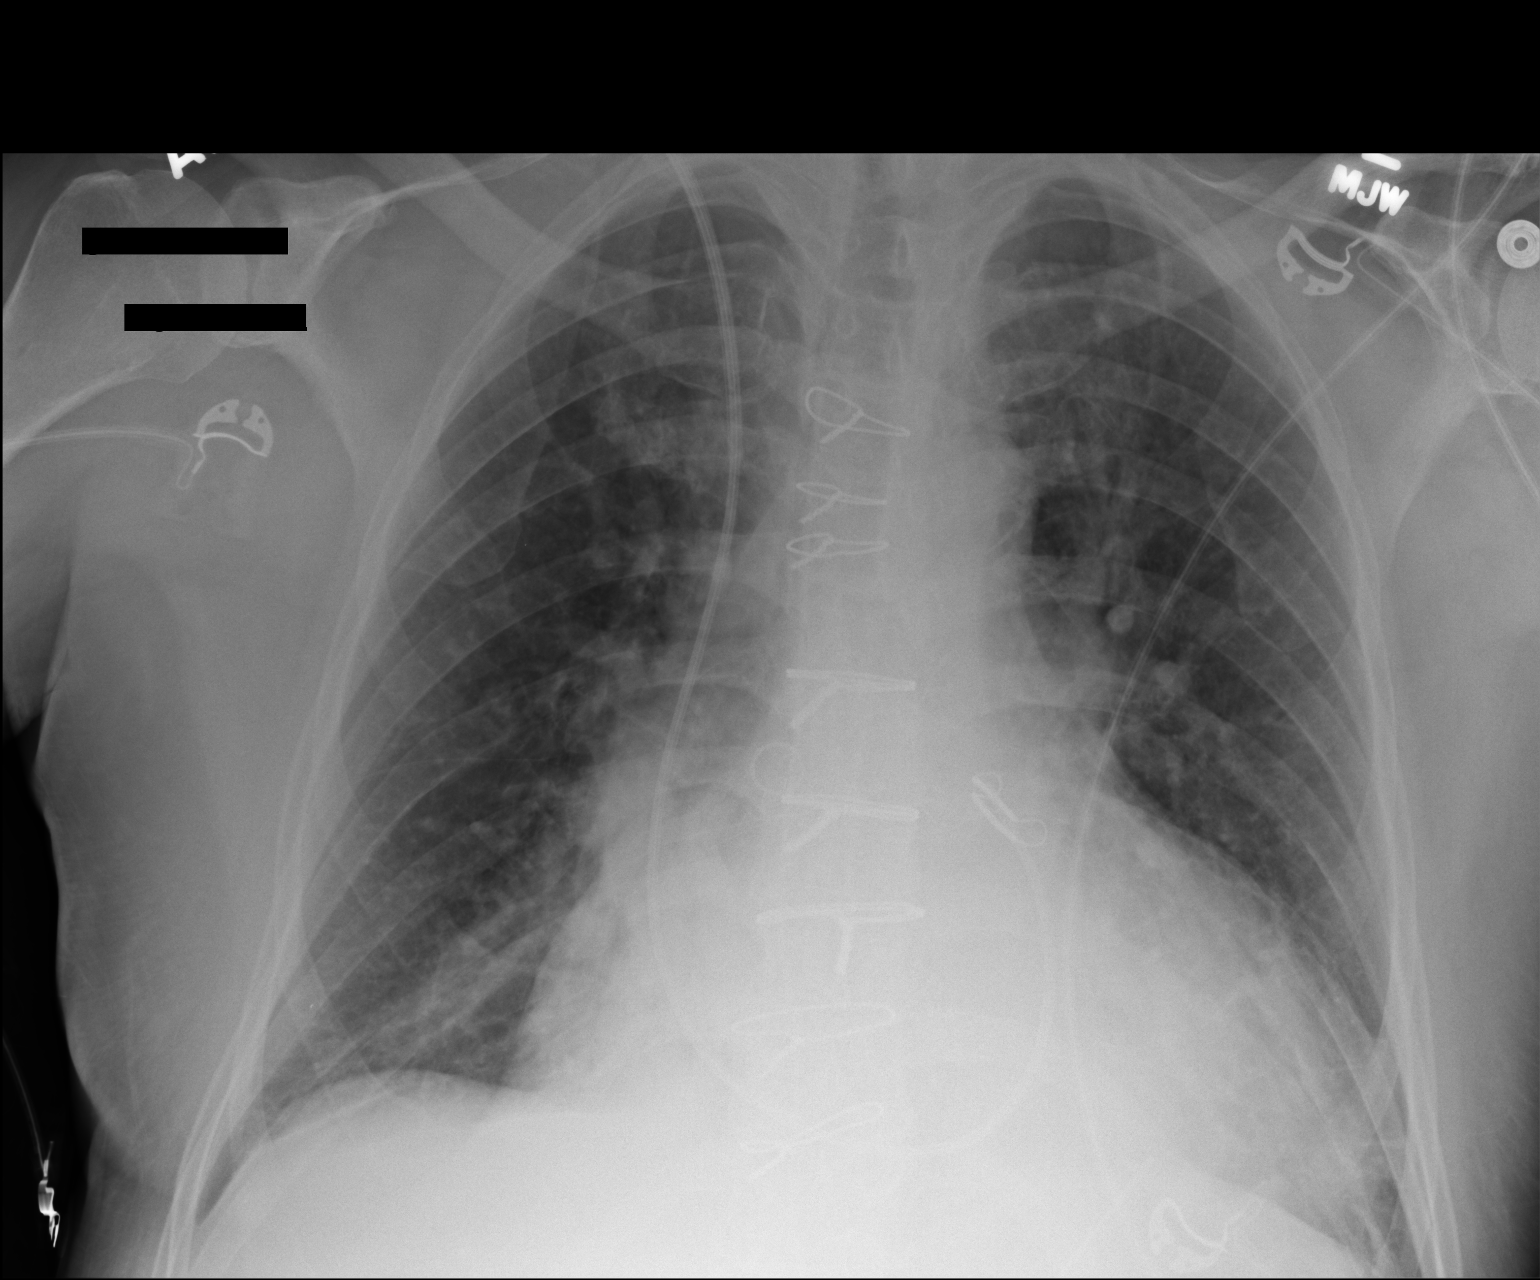

[1 of 1 positions shown; findings below may reference images not displayed]

FINDINGS: Interval extubation.  Nasogastric tube has been removed
as well.  Right IJ Swan-Ganz catheter tip projects over the
proximal right pulmonary artery.  Eight intact sternotomy wires.

Heart is enlarged, stable.  Interval improvement in overall
aeration with residual mild bibasilar air space disease.  No
pneumothorax.  Left costophrenic angle was not included on the
image.  No definite pleural fluid.
IMPRESSION: Improving overall aeration with minimal residual bibasilar air
space disease.

## 2015-03-13 IMAGING — CR DG CHEST 1V PORT
1 series · 1 of 1 positions shown · non-contrast
Comparison: 01/08/2013; 01/07/2013; 01/03/2013

CLINICAL DATA: Postop cardiac surgery

PORTABLE CHEST - 1 VIEW

[AP]
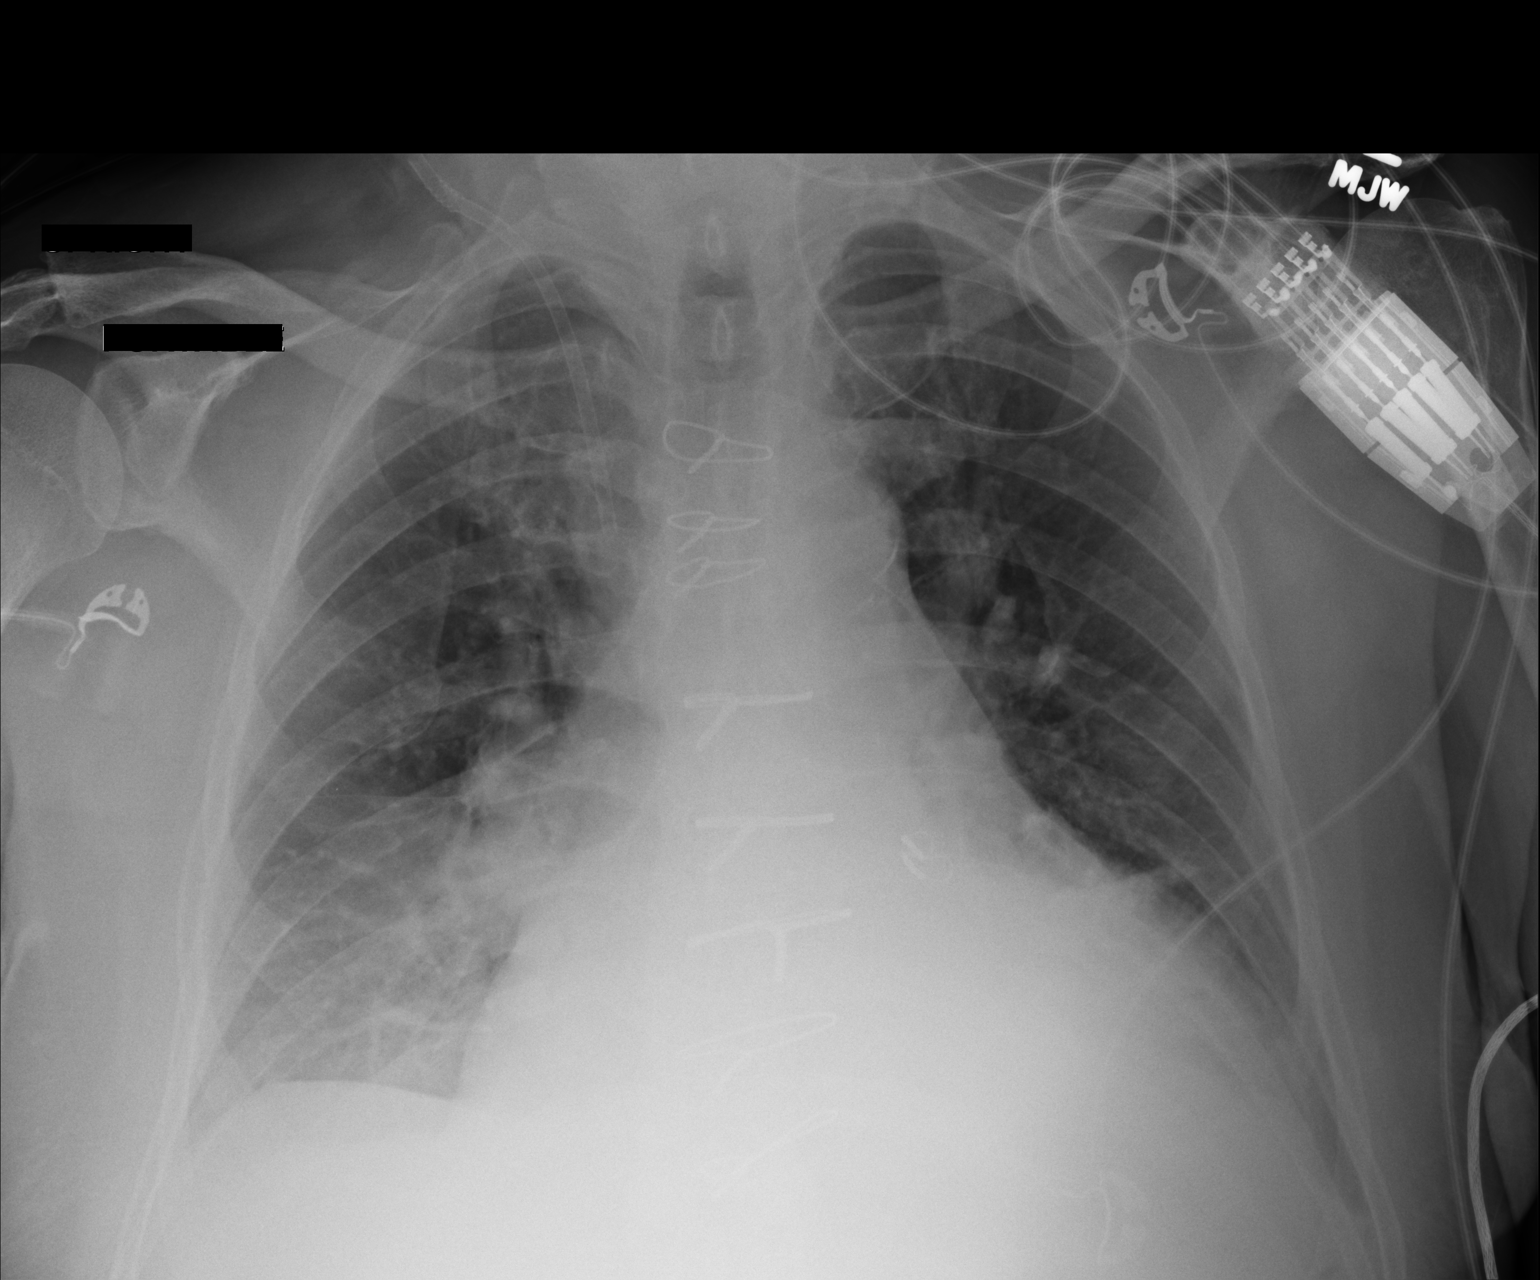

[1 of 1 positions shown; findings below may reference images not displayed]

FINDINGS: Grossly unchanged enlarged cardiac silhouette and mediastinal
contours post median sternotomy and CABG.  Interval removal of PA
catheter with remaining vascular sheath tip projecting over the
superior SVC.  Slightly decreased lung volumes with worsening
bibasilar opacities, left greater than right.  Trace bilateral
effusions are not excluded.  Grossly unchanged venous congestion
without frank evidence of edema.  Unchanged bones.
IMPRESSION: 1.  Decreased lung volumes with worsening bibasilar opacities
favored to represent atelectasis.
2.  Persistent findings of cardiomegaly and pulmonary venous
congestion without frank evidence of edema.

## 2015-03-15 ENCOUNTER — Ambulatory Visit (INDEPENDENT_AMBULATORY_CARE_PROVIDER_SITE_OTHER): Payer: Managed Care, Other (non HMO) | Admitting: Cardiovascular Disease

## 2015-03-15 ENCOUNTER — Encounter: Payer: Self-pay | Admitting: Cardiovascular Disease

## 2015-03-15 VITALS — BP 150/62 | HR 60 | Ht 72.0 in | Wt 211.0 lb

## 2015-03-15 DIAGNOSIS — I4892 Unspecified atrial flutter: Secondary | ICD-10-CM | POA: Diagnosis not present

## 2015-03-15 DIAGNOSIS — Q210 Ventricular septal defect: Secondary | ICD-10-CM

## 2015-03-15 DIAGNOSIS — I4891 Unspecified atrial fibrillation: Secondary | ICD-10-CM

## 2015-03-15 DIAGNOSIS — Z23 Encounter for immunization: Secondary | ICD-10-CM

## 2015-03-15 NOTE — Progress Notes (Signed)
Aaron Jones Date of Birth  [birth year]       [facility] Office [address], [place], [place]  [address]   [place], [place]  [address] [phone]     [phone]   Fax  [phone]    Fax [phone]  Problem List: 1. Atrial fibrillation- s/p  2. Hypertension 3. Hyperlipidemia 4. Mitral valve prolapse 5. Diabetes mellitus 6. Ventricular septal defect  - s/p closure 7. CABG - SVG to RCA   History of Present Illness:  Aaron Jones is a 69 yo with the above noted hx.  he has had a heart murmur all of his life presumably due to this ventricular septal defect. He was admitted to [facility] for congestive heart failure, rapid atrial fibrillation and was found to have a ventricular septal defect. He was transferred from [facility] to [facility]. He was seen by the surgical team and eventually had closure of his perimembranous VSD by Dr. Servando Snare.  He also had  saphenous vein grafting to his right coronary artery at that time. He also had a right and left atrial Maze procedure as well as left atrial clipping.  He had recurrent atrial fibrillation and had an cardioversion.  He's been feeling well since that time. He has been ambulating without too much difficulty.    He does have some right shoulder pain since the hospitalization.  He is asymptomatic from an A-Fib standpoint.  He cannot tell if his rhythm is regular or not.   Dec. 3, 2014:  Aaron Jones is doing well.   No CP or dyspnea.  He can no longer hear his VSD murmur. During her last visit, there is  evidence of a persistent VSD. A followup echocardiogram revealed an intact patch but with a persistent  perimembranous VSD but was still a moderate left right shunt.  The patient family states that the murmur is a lot better- he used to be able to hear  it at night when he tried to go to sleep.  He still works Teaching laboratory technician)   Dec. 4,  2015:  Aaron Jones is doing well.  No significant CP or dyspnea. Has had a fast HR for the past week .   December 23, 2014:  Still has a fast HR   January 08, 2015:  Still having fast HR on occasion  HR is 115 at baseline  He increased metoprolol to 75 BID and had a HR of 54.  He had some chest twinges and so he decreased the metoprolol back to 50 bid He has a pulse oxymeter and has episodes of 210 - 220.   Oct. 10, 2016:  Aaron Jones was cardioverted in Sept.  Doing well.  Wants a flu shot .  Overall doing very well from a cardiac standpoint    Current Outpatient Prescriptions on File Prior to Visit  Medication Sig Dispense Refill  . atorvastatin (LIPITOR) 20 MG tablet Take 20 mg by mouth at bedtime.    Marland Kitchen ELIQUIS 5 MG TABS tablet TAKE 1 TABLET BY MOUTH TWICE DAILY 60 tablet 1  . furosemide (LASIX) 40 MG tablet TAKE 1 TABLET BY MOUTH EVERY DAY 30 tablet 1  . Glucose Blood (BLOOD GLUCOSE TEST STRIPS) STRP 1 strip by In Vitro route 2 (two) times daily. 100 each 0  . metFORMIN (GLUCOPHAGE) 1000 MG tablet Take 1,000 mg by mouth 2 (two) times daily.  1  .  metoprolol (LOPRESSOR) 100 MG tablet Take 1 tablet (100 mg total) by mouth 2 (two) times daily. 60 tablet 3  . omeprazole (PRILOSEC) 40 MG capsule Take 40 mg by mouth daily before breakfast.     . potassium chloride SA (K-DUR,KLOR-CON) 20 MEQ tablet TAKE 1 TABLET BY MOUTH EVERY DAY (Patient not taking: Reported on 03/15/2015) 30 tablet 1   No current facility-administered medications on file prior to visit.    Allergies  Allergen Reactions  . Biaxin [Clarithromycin] Nausea Only    Past Medical History  Diagnosis Date  . GERD (gastroesophageal reflux disease)   . Hyperlipidemia   . VSD (ventricular septal defect)     a. 01/2013 s/p VSD closure (dacron patch)  . Hypertension     PCP- Kathlynn Grate, phone; [phone]  . Coronary artery disease     a. 01/2013 s/p CABG x 1 (VG->PDA) in setting of VSD repair.  . Sleep apnea     [facility]-  in process of being evaluated now, states 12 yrs. ago was on CPAP but after having his deviated septum repaired, he had put the CPAP in storage. Pt. will get a new machine soon.   . Type II diabetes mellitus ([place])   . Umbilical hernia     "not repaired" (2013-01-31)  . Kidney stones     "6-7 times; they passed on their own" (2013/01/31)  . Tachycardia, paroxysmal [facility]) May 2013    Possible SVT  . Atrial fibrillation with rapid ventricular response ([place]) January 31, 2013    a. 01/2013 s/p R & L Maze and LAA clipping in setting of VSD repair;  b. On amiodarone/Apixaban;  c. 01/2013 Recurrent afib requiring DCCV.  Marland Kitchen COPD (chronic obstructive pulmonary disease) [facility])     Past Surgical History  Procedure Laterality Date  . Nasal septum surgery  2004  . Tee without cardioversion N/A 11/21/2012    Procedure: TRANSESOPHAGEAL ECHOCARDIOGRAM (TEE);  Surgeon: Jolaine Artist, MD;  Location: [facility] ENDOSCOPY;  Service: Cardiovascular;  Laterality: N/A;  . Cardiac catheterization  > 5 yr ago    Done at [facility], reportedly clean  . Cardiac catheterization  11/2012  . Multiple extractions with alveoloplasty N/A 12/11/2012    Procedure: Extraction of tooth #'s 2,5,6,3,89,37,34,28,76,81,15,72,62,03,55 wioth alveoloplasty and bialteral fibrous tuberosity reductions.;  Surgeon: Lenn Cal, DDS;  Location: [facility] ORS;  Service: Oral Surgery;  Laterality: N/A;  . Eye surgery Left 1990's    "clipped muscle so it wouldn't be pulling up" (01/31/13)  . Coronary artery bypass graft N/A 01/07/2013    Procedure: CORONARY ARTERY BYPASS GRAFTING (CABG);  Surgeon: Grace Isaac, MD;  Location: [place];  Service: Open Heart Surgery;  Laterality: N/A;  Times1 using endoscopically harvested saphenous vein graft to the PDA  . Maze N/A 01/07/2013    Procedure: MAZE;  Surgeon: Grace Isaac, MD;  Location: [place];  Service: Open Heart Surgery;  Laterality: N/A;  . Vsd repair N/A 01/07/2013    Procedure: VENTRICULAR SEPTAL DEFECT  (VSD) REPAIR;  Surgeon: Grace Isaac, MD;  Location: [place];  Service: Open Heart Surgery;  Laterality: N/A;  . Intraoperative transesophageal echocardiogram N/A 01/07/2013    Procedure: INTRAOPERATIVE TRANSESOPHAGEAL ECHOCARDIOGRAM;  Surgeon: Grace Isaac, MD;  Location: [place];  Service: Open Heart Surgery;  Laterality: N/A;  . Clipping of atrial appendage N/A 01/07/2013    Procedure: CLIPPING OF ATRIAL APPENDAGE;  Surgeon: Grace Isaac, MD;  Location: [place];  Service: Open Heart Surgery;  Laterality: N/A;  . Left and right heart catheterization with coronary angiogram N/A 11/20/2012    Procedure: LEFT AND RIGHT HEART CATHETERIZATION WITH CORONARY ANGIOGRAM;  Surgeon: Jolaine Artist, MD;  Location: [facility] CATH LAB;  Service: Cardiovascular;  Laterality: N/A;  . Cardioversion N/A 02/12/2015    Procedure: CARDIOVERSION;  Surgeon: Thayer Headings, MD;  Location: [facility] ENDOSCOPY;  Service: Cardiovascular;  Laterality: N/A;    History  Smoking status  . Never Smoker   Smokeless tobacco  . Never Used    History  Alcohol Use No    Family History  Problem Relation Age of Onset  . Cancer Mother   . Diabetes Mother     Reviw of Systems:  Reviewed in the HPI.  All other systems are negative.  Physical Exam: Blood pressure 150/62, pulse 60, height 6' (1.829 m), weight 95.709 kg (211 lb). General: Well developed, well nourished, in no acute distress.  Head: Normocephalic, atraumatic, sclera non-icteric, mucus membranes are moist,   Neck: Supple. Carotids are 2 + without bruits. No JVD   Lungs: Clear   Heart: His median sternotomy is healing quite nicely. There is no evidence of infection. His thoracostomy sites are healing well.   Regular rate, normal S1 and S2. He has a loud 3/6 systolic murmur at the upper left sternal border. There is a soft are 0-6/3 systolic murmur in the axilla consistent with mitral regurgitation  Abdomen: Soft, non-tender, non-distended with normal bowel  sounds.  Msk:  Strength and tone are normal   Extremities: No clubbing or cyanosis. No edema.  Distal pedal pulses are 2+ and equal   Neuro: CN II - XII intact.  Alert and oriented X 3.   Psych:  Normal   ECG: Oct. 10, 2016:   NSR with occasional PVCs.  RBBB, voltage criteria for LVH  Assessment / Plan:   1. Atrial fibrillation- s/p MAZE.     in sinus rhythm.  S/p cardioversion   continue current meds.   2. Hypertension- BP is well-controlled. 3. Hyperlipidemia 4. Mitral valve prolapse - has mild - moderate MR .  5. Diabetes mellitus 6. Ventricular septal defect  - s/p closure, he still has a significant systolic murmur but that is unchanged from earlier exams.  Repeat echocardiogram shows that he has normal LV function. His right side is not enlarged. Does not appear that he has a hemodynamically significant left-to-right shunt. 7. CABG - SVG to RCA  Will see him in 6 months    Antonio Woodhams, Wonda Cheng, MD  03/15/2015 8:27 AM    [address],  [place], [place]  [address] Pager [phone] Phone: [phone]; Fax: [phone]   [facility]  [address] [place], [place]  [address] [phone]   Fax [phone]

## 2015-03-15 NOTE — Patient Instructions (Signed)
Medication Instructions:  Your physician recommends that you continue on your current medications as directed. Please refer to the Current Medication list given to you today.   Labwork: BMET  Testing/Procedures: none  Follow-Up: Your physician wants you to follow-up in: six months with Dr. Vilinda Boehringer will receive a reminder letter in the mail two months in advance. If you don't receive a letter, please call our office to schedule the follow-up appointment.   Any Other Special Instructions Will Be Listed Below (If Applicable).

## 2015-03-16 LAB — BASIC METABOLIC PANEL
BUN / CREAT RATIO: 15 (ref 10–22)
BUN: 13 mg/dL (ref 8–27)
CHLORIDE: 99 mmol/L (ref 97–108)
CO2: 23 mmol/L (ref 18–29)
Calcium: 9 mg/dL (ref 8.6–10.2)
Creatinine, Ser: 0.87 mg/dL (ref 0.76–1.27)
GFR calc Af Amer: 103 mL/min/{1.73_m2} (ref >59)
GFR calc non Af Amer: 89 mL/min/{1.73_m2} (ref >59)
GLUCOSE: 241 mg/dL — AB (ref 65–99)
POTASSIUM: 5 mmol/L (ref 3.5–5.2)
SODIUM: 138 mmol/L (ref 134–144)

## 2015-07-21 ENCOUNTER — Other Ambulatory Visit: Payer: Self-pay

## 2015-07-21 MED ORDER — METOPROLOL TARTRATE 100 MG PO TABS: 100.0000 mg | ORAL_TABLET | Freq: Two times a day (BID) | ORAL | Status: DC

## 2015-07-21 NOTE — Telephone Encounter (Signed)
Refill sent for Metoprolol Tart 100 mg

## 2015-08-13 DIAGNOSIS — E119 Type 2 diabetes mellitus without complications: Secondary | ICD-10-CM | POA: Insufficient documentation

## 2015-11-19 DIAGNOSIS — I25118 Atherosclerotic heart disease of native coronary artery with other forms of angina pectoris: Secondary | ICD-10-CM | POA: Insufficient documentation

## 2015-11-19 DIAGNOSIS — I2510 Atherosclerotic heart disease of native coronary artery without angina pectoris: Secondary | ICD-10-CM | POA: Insufficient documentation

## 2015-11-19 DIAGNOSIS — Z8679 Personal history of other diseases of the circulatory system: Secondary | ICD-10-CM | POA: Insufficient documentation

## 2015-12-20 ENCOUNTER — Other Ambulatory Visit: Payer: Self-pay | Admitting: *Deleted

## 2015-12-20 MED ORDER — METOPROLOL TARTRATE 100 MG PO TABS: 100.0000 mg | ORAL_TABLET | Freq: Two times a day (BID) | ORAL | Status: DC

## 2015-12-20 NOTE — Telephone Encounter (Signed)
Requested Prescriptions   Signed Prescriptions Disp Refills  . metoprolol (LOPRESSOR) 100 MG tablet 60 tablet 0    Sig: Take 1 tablet (100 mg total) by mouth 2 (two) times daily.    Authorizing Provider: Thayer Headings    Ordering User: Britt Bottom

## 2016-01-11 ENCOUNTER — Ambulatory Visit (INDEPENDENT_AMBULATORY_CARE_PROVIDER_SITE_OTHER): Payer: Managed Care, Other (non HMO) | Admitting: Cardiology

## 2016-01-11 ENCOUNTER — Encounter: Payer: Self-pay | Admitting: Cardiology

## 2016-01-11 VITALS — BP 138/80 | HR 72 | Ht 72.0 in | Wt 213.8 lb

## 2016-01-11 DIAGNOSIS — Q210 Ventricular septal defect: Secondary | ICD-10-CM

## 2016-01-11 DIAGNOSIS — I4891 Unspecified atrial fibrillation: Secondary | ICD-10-CM | POA: Diagnosis not present

## 2016-01-11 DIAGNOSIS — I2510 Atherosclerotic heart disease of native coronary artery without angina pectoris: Secondary | ICD-10-CM

## 2016-01-11 DIAGNOSIS — I10 Essential (primary) hypertension: Secondary | ICD-10-CM | POA: Diagnosis not present

## 2016-01-11 DIAGNOSIS — I77810 Thoracic aortic ectasia: Secondary | ICD-10-CM

## 2016-01-11 DIAGNOSIS — E785 Hyperlipidemia, unspecified: Secondary | ICD-10-CM

## 2016-01-11 MED ORDER — ATORVASTATIN CALCIUM 20 MG PO TABS: 20.0000 mg | ORAL_TABLET | Freq: Every day | ORAL | 3 refills | Status: DC

## 2016-01-11 MED ORDER — METOPROLOL TARTRATE 100 MG PO TABS
100.0000 mg | ORAL_TABLET | Freq: Two times a day (BID) | ORAL | 3 refills | Status: DC
Start: 2016-01-11 — End: 2017-03-07

## 2016-01-11 MED ORDER — APIXABAN 5 MG PO TABS
5.0000 mg | ORAL_TABLET | Freq: Two times a day (BID) | ORAL | 3 refills | Status: DC
Start: 2016-01-11 — End: 2017-02-28

## 2016-01-11 NOTE — Progress Notes (Signed)
Cardiology Office Note   Date:  01/11/2016   ID:  Aaron Jones, DOB September 22, 1945, MRN [id number]  Referring Doctor:  Madelyn Brunner, MD   Cardiologist:   Wende Bushy, MD   Reason for consultation:  Chief Complaint  Patient presents with  . Other    Former patient of Dr. Elmarie Shiley for A-Fib. Meds reviewed by the patient verbally. "doing well."       History of Present Illness: Aaron Jones is a 70 y.o. male who presents for Follow-up for history of atrial fibrillation, VSD, MR.  Patient has been doing well. Denies chest pain, shortness of breath. He denies PND, orthopnea, edema.  In terms of age of her ablation, he does not know when he goes out of rhythm. He tolerates to oral anticoagulation well. No issues with bleeding.  In terms of hypertension, blood pressure has been well-controlled.  In terms of the VSD. He is aware that he has a residual VSD.   ROS:  Please see the history of present illness. Aside from mentioned under HPI, all other systems are reviewed and negative.     Past Medical History:  Diagnosis Date  . Atrial fibrillation with rapid ventricular response ([place]) 02-11-2013   a. 01/2013 s/p R & L Maze and LAA clipping in setting of VSD repair;  b. On amiodarone/Apixaban;  c. 01/2013 Recurrent afib requiring DCCV.  Marland Kitchen COPD (chronic obstructive pulmonary disease) ([place])   . Coronary artery disease    a. 01/2013 s/p CABG x 1 (VG->PDA) in setting of VSD repair.  Marland Kitchen GERD (gastroesophageal reflux disease)   . Hyperlipidemia   . Hypertension    PCP- Kathlynn Grate, phone; [phone]  . Kidney stones    "6-7 times; they passed on their own" (2013-02-11)  . Sleep apnea    [facility]- in process of being evaluated now, states 12 yrs. ago was on CPAP but after having his deviated septum repaired, he had put the CPAP in storage. Pt. will get a new machine soon.   . Tachycardia, paroxysmal [facility]) May 2013   Possible SVT  . Type II diabetes mellitus ([place])   .  Umbilical hernia    "not repaired" (02/11/13)  . VSD (ventricular septal defect)    a. 01/2013 s/p VSD closure (dacron patch)    Past Surgical History:  Procedure Laterality Date  . CARDIAC CATHETERIZATION  > 5 yr ago   Done at [facility], reportedly clean  . CARDIAC CATHETERIZATION  11/2012  . CARDIOVERSION N/A 02/12/2015   Procedure: CARDIOVERSION;  Surgeon: Thayer Headings, MD;  Location: [facility] ENDOSCOPY;  Service: Cardiovascular;  Laterality: N/A;  . CLIPPING OF ATRIAL APPENDAGE N/A 01/07/2013   Procedure: CLIPPING OF ATRIAL APPENDAGE;  Surgeon: Grace Isaac, MD;  Location: [place];  Service: Open Heart Surgery;  Laterality: N/A;  . CORONARY ARTERY BYPASS GRAFT N/A 01/07/2013   Procedure: CORONARY ARTERY BYPASS GRAFTING (CABG);  Surgeon: Grace Isaac, MD;  Location: [place];  Service: Open Heart Surgery;  Laterality: N/A;  Times1 using endoscopically harvested saphenous vein graft to the PDA  . EYE SURGERY Left 1990's   "clipped muscle so it wouldn't be pulling up" (February 11, 2013)  . INTRAOPERATIVE TRANSESOPHAGEAL ECHOCARDIOGRAM N/A 01/07/2013   Procedure: INTRAOPERATIVE TRANSESOPHAGEAL ECHOCARDIOGRAM;  Surgeon: Grace Isaac, MD;  Location: [place];  Service: Open Heart Surgery;  Laterality: N/A;  . LEFT AND RIGHT HEART CATHETERIZATION WITH CORONARY ANGIOGRAM N/A 11/20/2012   Procedure: LEFT AND RIGHT HEART  CATHETERIZATION WITH CORONARY ANGIOGRAM;  Surgeon: Jolaine Artist, MD;  Location: [facility] CATH LAB;  Service: Cardiovascular;  Laterality: N/A;  . MAZE N/A 01/07/2013   Procedure: MAZE;  Surgeon: Grace Isaac, MD;  Location: [place];  Service: Open Heart Surgery;  Laterality: N/A;  . MULTIPLE EXTRACTIONS WITH ALVEOLOPLASTY N/A 12/11/2012   Procedure: Extraction of tooth #'s [id number] wioth alveoloplasty and bialteral fibrous tuberosity reductions.;  Surgeon: Lenn Cal, DDS;  Location: [facility];  Service: Oral Surgery;  Laterality: N/A;  . NASAL SEPTUM  SURGERY  2004  . TEE WITHOUT CARDIOVERSION N/A 11/21/2012   Procedure: TRANSESOPHAGEAL ECHOCARDIOGRAM (TEE);  Surgeon: Jolaine Artist, MD;  Location[facility] ENDOSCOPY;  Service: Cardiovascular;  Laterality: N/A;  . VSD REPAIR N/A 01/07/2013   Procedure: VENTRICULAR SEPTAL DEFECT (VSD) REPAIR;  Surgeon: Grace Isaac, MD;  Location: [place];  Service: Open Heart Surgery;  Laterality: N/A;     reports that he has never smoked. He has never used smokeless tobacco. He reports that he does not drink alcohol or use drugs.   family history includes Cancer in his mother; Diabetes in his mother.   Outpatient Medications Prior to Visit  Medication Sig Dispense Refill  . atorvastatin (LIPITOR) 20 MG tablet Take 20 mg by mouth at bedtime.    Marland Kitchen ELIQUIS 5 MG TABS tablet TAKE 1 TABLET BY MOUTH TWICE DAILY 60 tablet 1  . furosemide (LASIX) 40 MG tablet TAKE 1 TABLET BY MOUTH EVERY DAY 30 tablet 1  . Glucose Blood (BLOOD GLUCOSE TEST STRIPS) STRP 1 strip by In Vitro route 2 (two) times daily. 100 each 0  . metFORMIN (GLUCOPHAGE) 1000 MG tablet Take 1,000 mg by mouth 2 (two) times daily.  1  . metoprolol (LOPRESSOR) 100 MG tablet Take 1 tablet (100 mg total) by mouth 2 (two) times daily. 60 tablet 0  . omeprazole (PRILOSEC) 40 MG capsule Take 40 mg by mouth daily before breakfast.     . potassium chloride SA (K-DUR,KLOR-CON) 20 MEQ tablet TAKE 1 TABLET BY MOUTH EVERY DAY (Patient not taking: Reported on 01/11/2016) 30 tablet 1   No facility-administered medications prior to visit.      Allergies: Biaxin [clarithromycin]    PHYSICAL EXAM: VS:  BP 138/80 (BP Location: Left Arm, Patient Position: Sitting, Cuff Size: Normal)   Pulse 72   Ht 6' (1.829 m)   Wt 213 lb 12 oz (97 kg)   BMI 28.99 kg/m  , Body mass index is 28.99 kg/m. Wt Readings from Last 3 Encounters:  01/11/16 213 lb 12 oz (97 kg)  03/15/15 211 lb (95.7 kg)  02/11/15 210 lb 8 oz (95.5 kg)    GENERAL:  well developed, well nourished, not  in acute distress HEENT: normocephalic, pink conjunctivae, anicteric sclerae, no xanthelasma, normal dentition, oropharynx clear NECK:  no neck vein engorgement, JVP normal, no hepatojugular reflux, carotid upstroke brisk and symmetric, no bruit, no thyromegaly, no lymphadenopathy LUNGS:  good respiratory effort, clear to auscultation bilaterally CV:  PMI not displaced, no thrills, no lifts, S1 and S2 within normal limits, no palpable S3 or S4, 3/6 systolic murmur parasternal, no rubs, no gallops ABD:  Soft, nontender, nondistended, normoactive bowel sounds, no abdominal aortic bruit, no hepatomegaly, no splenomegaly MS: nontender back, no kyphosis, no scoliosis, no joint deformities EXT:  2+ DP/PT pulses, no edema, no varicosities, no cyanosis, no clubbing SKIN: warm, nondiaphoretic, normal turgor, no ulcers NEUROPSYCH: alert, oriented to person, place, and time, sensory/motor grossly  intact, normal mood, appropriate affect  Recent Labs: 02/02/2015: Platelets 250 03/15/2015: BUN 13; Creatinine, Ser 0.87; Potassium 5.0; Sodium 138   Lipid Panel    Component Value Date/Time   CHOL 142 04/17/2013 0833   CHOL 192 10/29/2011 0429   TRIG 125 04/17/2013 0833   TRIG 150 10/29/2011 0429   HDL 41 04/17/2013 0833   HDL 32 (L) 10/29/2011 0429   CHOLHDL 3.5 04/17/2013 0833   VLDL 30 10/29/2011 0429   LDLCALC 76 04/17/2013 0833   LDLCALC 130 (H) 10/29/2011 0429     Other studies Reviewed:  EKG:  The ekg from 01/11/2016 was personally reviewed by me and it revealed sinus rhythm, occasional PVC. Ventricular rate 71 BPM. Right bundle branch block, LAFB. Bifascicular block. No significant change from 03/11/2015 EKG.  Additional studies/ records that were reviewed personally reviewed by me today include:  Echo 01/01/2015: Left ventricle: The cavity size was mildly dilated. Wall   thickness was increased in a pattern of mild LVH. Systolic   function was normal. The estimated ejection fraction was  in the   range of 50% to 55%. Wall motion was normal; there were no   regional wall motion abnormalities. Doppler parameters are   consistent with high ventricular filling pressure. - Ventricular septum: There was a residual ventricular septal   defectin the perimembranous region. - Aortic valve: There was mild regurgitation. - Aortic root: The aortic root was mildly dilated. - Mitral valve: Mild prolapse, involving the anterior leaflet.   There was moderate regurgitation. - Left atrium: The atrium was moderately dilated. - Right atrium: The atrium was mildly dilated.  Impressions:  - Low normal LV function; elevated LV filling pressure; moderate   LAE; mild RAE; calcified aortic valve with mild AI; prolapse of   anterior MV leaflet with moderate MR; mild TR; doppler evidence   of residual perimembranous VSD with peak velocity of 5.8 m/s.   Compared to 02/07/13, LV function has mildly decreased and MR now   moderate.   ASSESSMENT AND PLAN: Atrial fibrillation- s/p MAZE.      recurrent afib S/p cardioversion  in sinus rhythm.   Continue Metoprolol Continue Eliquis.  CAD s/p CABG with SVG to RCA No chest pain. LVEF within normal limits. Continue medical therapy.  Mitral valve prolapse - has mild - moderate MR  Echocardiogram for reevaluation  Aorta dilatation  4.3cm on echo 01/01/2015 No recent imaging study. Recommend CTA to evaluate the aorta dimensions.  Hypertension BP is well-controlled. Cont medical therapy.  Hyperlipidemia Recommended LDL goal is less than 70 due to history of CAD. PCP following labs.  Ventricular septal defect s/p closure he still has a significant systolic murmur but likely is unchanged..  Repeat echocardiogram shows that he has normal LV function. His right side is not enlarged. Does not appear that he has a hemodynamically significant left-to-right shunt. Recommend echocardiogram for serial evaluation.  Bifascicular block Known from  previous EKGs   Current medicines are reviewed at length with the patient today.  The patient does not have concerns regarding medicines.  Labs/ tests ordered today include:  Orders Placed This Encounter  Procedures  . EKG 12-Lead    I had a lengthy and detailed discussion with the patient regarding diagnoses, prognosis, diagnostic options, treatment options , and side effects of medications.   I counseled the patient on importance of lifestyle modification including heart healthy diet, regular physical activity .   Disposition:   FU with undersigned in 6 months  Signed, Wende Bushy, MD  01/11/2016 1:35 PM    [place]  This note was generated in part with voice recognition software and I apologize for any typographical errors that were not detected and corrected.

## 2016-01-11 NOTE — Patient Instructions (Addendum)
Medication Instructions:  Refills sent in for your medications.   Labwork: CMP done today here in our office.  Testing/Procedures: Your physician has requested that you have an echocardiogram. Echocardiography is a painless test that uses sound waves to create images of your heart. It provides your doctor with information about the size and shape of your heart and how well your heart's chambers and valves are working. This procedure takes approximately one hour. There are no restrictions for this procedure.  Non-Cardiac CT Angiography (CTA), is a special type of CT scan that uses a computer to produce multi-dimensional views of major blood vessels throughout the body. In CT angiography, a contrast material is injected through an IV to help visualize the blood vessels  January 28, 2016 at 2:00 PM  ARRIVE at 1:45 PM to register  Nothing to eat or drink after 9:30 AM   Follow-Up: Your physician wants you to follow-up in: 6 months with Dr. Yvone Neu.  You will receive a reminder letter in the mail two months in advance. If you don't receive a letter, please call our office to schedule the follow-up appointment.  It was a pleasure seeing you today here in the office. Please do not hesitate to give [place] a call back if you have any further questions. [place], BSN    Any Other Special Instructions Will Be Listed Below (If Applicable).     If you need a refill on your cardiac medications before your next appointment, please call your pharmacy.

## 2016-01-12 LAB — COMPREHENSIVE METABOLIC PANEL
ALBUMIN: 4 g/dL (ref 3.6–4.8)
ALT: 11 IU/L (ref 0–44)
AST: 17 IU/L (ref 0–40)
Albumin/Globulin Ratio: 1.5 (ref 1.2–2.2)
Alkaline Phosphatase: 63 IU/L (ref 39–117)
BILIRUBIN TOTAL: 0.4 mg/dL (ref 0.0–1.2)
BUN/Creatinine Ratio: 18 (ref 10–24)
BUN: 15 mg/dL (ref 8–27)
CHLORIDE: 100 mmol/L (ref 96–106)
CO2: 22 mmol/L (ref 18–29)
CREATININE: 0.85 mg/dL (ref 0.76–1.27)
Calcium: 8.9 mg/dL (ref 8.6–10.2)
GFR calc non Af Amer: 90 mL/min/{1.73_m2} (ref >59)
GFR, EST AFRICAN AMERICAN: 103 mL/min/{1.73_m2} (ref >59)
GLUCOSE: 169 mg/dL — AB (ref 65–99)
Globulin, Total: 2.7 g/dL (ref 1.5–4.5)
Potassium: 4.9 mmol/L (ref 3.5–5.2)
Sodium: 142 mmol/L (ref 134–144)
TOTAL PROTEIN: 6.7 g/dL (ref 6.0–8.5)

## 2016-01-28 ENCOUNTER — Ambulatory Visit (HOSPITAL_COMMUNITY)
Admission: RE | Admit: 2016-01-28 | Discharge: 2016-01-28 | Disposition: A | Discharge status: Home / Self Care | Payer: Managed Care, Other (non HMO) | Source: Ambulatory Visit | Attending: Cardiology | Admitting: Cardiology

## 2016-01-28 ENCOUNTER — Other Ambulatory Visit: Payer: Self-pay

## 2016-01-28 DIAGNOSIS — I10 Essential (primary) hypertension: Secondary | ICD-10-CM

## 2016-01-28 DIAGNOSIS — I4891 Unspecified atrial fibrillation: Secondary | ICD-10-CM | POA: Diagnosis present

## 2016-01-28 DIAGNOSIS — I119 Hypertensive heart disease without heart failure: Secondary | ICD-10-CM | POA: Insufficient documentation

## 2016-01-28 MED ORDER — IOPAMIDOL (ISOVUE-370) INJECTION 76%
INTRAVENOUS | Status: AC
  Filled 2016-01-28: qty 100

## 2016-02-01 ENCOUNTER — Ambulatory Visit (INDEPENDENT_AMBULATORY_CARE_PROVIDER_SITE_OTHER): Payer: Managed Care, Other (non HMO)

## 2016-02-01 ENCOUNTER — Other Ambulatory Visit: Payer: Self-pay

## 2016-02-01 DIAGNOSIS — I4891 Unspecified atrial fibrillation: Secondary | ICD-10-CM | POA: Diagnosis not present

## 2016-02-01 DIAGNOSIS — I10 Essential (primary) hypertension: Secondary | ICD-10-CM | POA: Diagnosis not present

## 2016-02-01 LAB — ECHOCARDIOGRAM COMPLETE
AO mean calculated velocity dopler: 140 cm/s
AOASC: 35 cm
AOPV: 0.4 m/s
AOVTI: 40.6 cm
AV Area VTI index: 0.92 cm2/m2
AV Area mean vel: 1.69 cm2
AV VEL mean LVOT/AV: 0.41
AV area mean vel ind: 0.76 cm2/m2
AV peak Index: 0.75
AV pk vel: 208 cm/s
AV vel: 2.05
AVAREAVTI: 1.67 cm2
AVG: 9 mmHg
AVPG: 17 mmHg
AVPHT: 827 ms
E decel time: 201 msec
E/e' ratio: 9.9
FS: 45 % — AB (ref 28–44)
IV/PV OW: 0.95
LA diam end sys: 37 mm
LADIAMINDEX: 1.65 cm/m2
LASIZE: 37 mm
LAVOL: 107 mL
LAVOLA4C: 93.2 mL
LAVOLIN: 47.8 mL/m2
LV PW d: 14.6 mm — AB (ref 0.6–1.1)
LV TDI E'LATERAL: 10.3
LV TDI E'MEDIAL: 7.72
LVDIAVOL: 168 mL — AB (ref 62–150)
LVDIAVOLIN: 75 mL/m2
LVEEAVG: 9.9
LVEEMED: 9.9
LVELAT: 10.3 cm/s
LVOT VTI: 20.1 cm
LVOT area: 4.15 cm2
LVOT peak VTI: 0.5 cm
LVOT peak vel: 83.9 cm/s
LVOTD: 23 mm
LVOTSV: 83 mL
LVSYSVOL: 69 mL — AB (ref 21–61)
LVSYSVOLIN: 31 mL/m2
MV Dec: 201
MV Peak grad: 4 mmHg
MV pk A vel: 55.8 m/s
MV pk E vel: 102 m/s
Simpson's disk: 59
Stroke v: 99 ml
TAPSE: 22.9 mm
Valve area index: 0.92
Valve area: 2.05 cm2

## 2016-02-02 MED ORDER — IOPAMIDOL (ISOVUE-370) INJECTION 76%
100.0000 mL | Freq: Once | INTRAVENOUS | Status: AC | PRN
  Administered 2016-01-28: 100 mL via INTRAVENOUS

## 2016-02-04 ENCOUNTER — Other Ambulatory Visit: Payer: Self-pay

## 2016-10-11 ENCOUNTER — Encounter: Payer: Self-pay | Admitting: Cardiology

## 2016-10-11 ENCOUNTER — Ambulatory Visit (INDEPENDENT_AMBULATORY_CARE_PROVIDER_SITE_OTHER): Payer: Managed Care, Other (non HMO) | Admitting: Cardiology

## 2016-10-11 VITALS — BP 140/66 | HR 66 | Ht 71.0 in | Wt 215.2 lb

## 2016-10-11 DIAGNOSIS — I340 Nonrheumatic mitral (valve) insufficiency: Secondary | ICD-10-CM

## 2016-10-11 DIAGNOSIS — I4891 Unspecified atrial fibrillation: Secondary | ICD-10-CM | POA: Diagnosis not present

## 2016-10-11 DIAGNOSIS — Q210 Ventricular septal defect: Secondary | ICD-10-CM | POA: Diagnosis not present

## 2016-10-11 DIAGNOSIS — I2510 Atherosclerotic heart disease of native coronary artery without angina pectoris: Secondary | ICD-10-CM

## 2016-10-11 DIAGNOSIS — I77819 Aortic ectasia, unspecified site: Secondary | ICD-10-CM | POA: Diagnosis not present

## 2016-10-11 NOTE — Progress Notes (Signed)
Cardiology Office Note   Date:  10/11/2016   ID:  Aaron Jones, DOB 02/19/1946, MRN [id number]  Referring Doctor:  Madelyn Brunner, MD   Cardiologist:   Wende Bushy, MD   Reason for consultation:  Chief Complaint  Patient presents with  . other    Over due 6 month follow up. Meds reviewed verbally with patient.       History of Present Illness: Aaron Jones is a 71 y.o. male who presents for Follow-up for history of atrial fibrillation, VSD, MR.  Since last visit, patient has been doing well. Blood pressure seems to be within normal limits. From wintertime to April, he was busy with his work, working long days 12 hours a day, 6 days a week. He is constantly moving at work, sometimes lifting less than 20 pound loads. He does not have any chest pain or shortness of breath.   ROS:  Please see the history of present illness. Aside from mentioned under HPI, all other systems are reviewed and negative.     Past Medical History:  Diagnosis Date  . Atrial fibrillation with rapid ventricular response ([place]) 14-Feb-2013   a. 01/2013 s/p R & L Maze and LAA clipping in setting of VSD repair;  b. On amiodarone/Apixaban;  c. 01/2013 Recurrent afib requiring DCCV.  Marland Kitchen COPD (chronic obstructive pulmonary disease) ([place])   . Coronary artery disease    a. 01/2013 s/p CABG x 1 (VG->PDA) in setting of VSD repair.  Marland Kitchen GERD (gastroesophageal reflux disease)   . Hyperlipidemia   . Hypertension    PCP- Kathlynn Grate, phone; [phone]  . Kidney stones    "6-7 times; they passed on their own" (February 14, 2013)  . Sleep apnea    [facility]- in process of being evaluated now, states 12 yrs. ago was on CPAP but after having his deviated septum repaired, he had put the CPAP in storage. Pt. will get a new machine soon.   . Tachycardia, paroxysmal [facility]) May 2013   Possible SVT  . Type II diabetes mellitus ([place])   . Umbilical hernia    "not repaired" (2013-02-14)  . VSD (ventricular septal defect)    a. 01/2013 s/p VSD closure (dacron patch)    Past Surgical History:  Procedure Laterality Date  . CARDIAC CATHETERIZATION  > 5 yr ago   Done at [facility], reportedly clean  . CARDIAC CATHETERIZATION  11/2012  . CARDIOVERSION N/A 02/12/2015   Procedure: CARDIOVERSION;  Surgeon: Thayer Headings, MD;  Location: [facility] ENDOSCOPY;  Service: Cardiovascular;  Laterality: N/A;  . CLIPPING OF ATRIAL APPENDAGE N/A 01/07/2013   Procedure: CLIPPING OF ATRIAL APPENDAGE;  Surgeon: Grace Isaac, MD;  Location: [place];  Service: Open Heart Surgery;  Laterality: N/A;  . CORONARY ARTERY BYPASS GRAFT N/A 01/07/2013   Procedure: CORONARY ARTERY BYPASS GRAFTING (CABG);  Surgeon: Grace Isaac, MD;  Location: [place];  Service: Open Heart Surgery;  Laterality: N/A;  Times1 using endoscopically harvested saphenous vein graft to the PDA  . EYE SURGERY Left 1990's   "clipped muscle so it wouldn't be pulling up" (14-Feb-2013)  . INTRAOPERATIVE TRANSESOPHAGEAL ECHOCARDIOGRAM N/A 01/07/2013   Procedure: INTRAOPERATIVE TRANSESOPHAGEAL ECHOCARDIOGRAM;  Surgeon: Grace Isaac, MD;  Location: [place];  Service: Open Heart Surgery;  Laterality: N/A;  . LEFT AND RIGHT HEART CATHETERIZATION WITH CORONARY ANGIOGRAM N/A 11/20/2012   Procedure: LEFT AND RIGHT HEART CATHETERIZATION WITH CORONARY ANGIOGRAM;  Surgeon: Jolaine Artist, MD;  Location: [facility]  CATH LAB;  Service: Cardiovascular;  Laterality: N/A;  . MAZE N/A 01/07/2013   Procedure: MAZE;  Surgeon: Grace Isaac, MD;  Location: [place];  Service: Open Heart Surgery;  Laterality: N/A;  . MULTIPLE EXTRACTIONS WITH ALVEOLOPLASTY N/A 12/11/2012   Procedure: Extraction of tooth #'s 3,2,9,9,24,26,83,41,96,22,29,79,89,21,19 wioth alveoloplasty and bialteral fibrous tuberosity reductions.;  Surgeon: Lenn Cal, DDS;  Location: [facility];  Service: Oral Surgery;  Laterality: N/A;  . NASAL SEPTUM SURGERY  2004  . TEE WITHOUT CARDIOVERSION N/A 11/21/2012   Procedure: TRANSESOPHAGEAL  ECHOCARDIOGRAM (TEE);  Surgeon: Jolaine Artist, MD;  Location: [facility] ENDOSCOPY;  Service: Cardiovascular;  Laterality: N/A;  . VSD REPAIR N/A 01/07/2013   Procedure: VENTRICULAR SEPTAL DEFECT (VSD) REPAIR;  Surgeon: Grace Isaac, MD;  Location: [place];  Service: Open Heart Surgery;  Laterality: N/A;     reports that he has never smoked. He has never used smokeless tobacco. He reports that he does not drink alcohol or use drugs.   family history includes Cancer in his mother; Diabetes in his mother.   Outpatient Medications Prior to Visit  Medication Sig Dispense Refill  . apixaban (ELIQUIS) 5 MG TABS tablet Take 1 tablet (5 mg total) by mouth 2 (two) times daily. 180 tablet 3  . atorvastatin (LIPITOR) 20 MG tablet Take 1 tablet (20 mg total) by mouth at bedtime. 90 tablet 3  . furosemide (LASIX) 40 MG tablet TAKE 1 TABLET BY MOUTH EVERY DAY 30 tablet 1  . Glucose Blood (BLOOD GLUCOSE TEST STRIPS) STRP 1 strip by In Vitro route 2 (two) times daily. 100 each 0  . metFORMIN (GLUCOPHAGE) 1000 MG tablet Take 1,000 mg by mouth 2 (two) times daily.  1  . metoprolol (LOPRESSOR) 100 MG tablet Take 1 tablet (100 mg total) by mouth 2 (two) times daily. 180 tablet 3  . omeprazole (PRILOSEC) 40 MG capsule Take 40 mg by mouth daily before breakfast.      No facility-administered medications prior to visit.      Allergies: Biaxin [clarithromycin]    PHYSICAL EXAM: VS:  BP 140/66 (BP Location: Left Arm, Patient Position: Sitting, Cuff Size: Normal)   Pulse 66   Ht 5\' 11"  (1.803 m)   Wt 215 lb 4 oz (97.6 kg)   BMI 30.02 kg/m  , Body mass index is 30.02 kg/m. Wt Readings from Last 3 Encounters:  10/11/16 215 lb 4 oz (97.6 kg)  01/11/16 213 lb 12 oz (97 kg)  03/15/15 211 lb (95.7 kg)    GENERAL:  well developed, well nourished, obese, not in acute distress HEENT: normocephalic, pink conjunctivae, anicteric sclerae, no xanthelasma, normal dentition, oropharynx clear NECK:  no neck vein  engorgement, JVP normal, no hepatojugular reflux, carotid upstroke brisk and symmetric, no bruit, no thyromegaly, no lymphadenopathy LUNGS:  good respiratory effort, clear to auscultation bilaterally CV:  PMI not displaced, no thrills, no lifts, S1 and S2 within normal limits, no palpable S3 or S4, 3/6 systolic murmur parasternal, no rubs, no gallops ABD:  Soft, nontender, nondistended, normoactive bowel sounds, no abdominal aortic bruit, no hepatomegaly, no splenomegaly MS: nontender back, no kyphosis, no scoliosis, no joint deformities EXT:  2+ DP/PT pulses, no edema, no varicosities, no cyanosis, no clubbing SKIN: warm, nondiaphoretic, normal turgor, no ulcers NEUROPSYCH: alert, oriented to person, place, and time, sensory/motor grossly intact, normal mood, appropriate affect     Recent Labs: 01/11/2016: ALT 11; BUN 15; Creatinine, Ser 0.85; Potassium 4.9; Sodium 142   Lipid  Panel    Component Value Date/Time   CHOL 142 04/17/2013 0833   CHOL 192 10/29/2011 0429   TRIG 125 04/17/2013 0833   TRIG 150 10/29/2011 0429   HDL 41 04/17/2013 0833   HDL 32 (L) 10/29/2011 0429   CHOLHDL 3.5 04/17/2013 0833   VLDL 30 10/29/2011 0429   LDLCALC 76 04/17/2013 0833   LDLCALC 130 (H) 10/29/2011 0429     Other studies Reviewed:  EKG:  The ekg from 01/11/2016 was personally reviewed by me and it revealed sinus rhythm, occasional PVC. Ventricular rate 71 BPM. Right bundle branch block, LAFB. Bifascicular block. No significant change from 03/11/2015 EKG.  Additional studies/ records that were reviewed personally reviewed by me today include:  Echo 01/01/2015: Left ventricle: The cavity size was mildly dilated. Wall   thickness was increased in a pattern of mild LVH. Systolic   function was normal. The estimated ejection fraction was in the   range of 50% to 55%. Wall motion was normal; there were no   regional wall motion abnormalities. Doppler parameters are   consistent with high  ventricular filling pressure. - Ventricular septum: There was a residual ventricular septal   defectin the perimembranous region. - Aortic valve: There was mild regurgitation. - Aortic root: The aortic root was mildly dilated. - Mitral valve: Mild prolapse, involving the anterior leaflet.   There was moderate regurgitation. - Left atrium: The atrium was moderately dilated. - Right atrium: The atrium was mildly dilated.  Impressions:  - Low normal LV function; elevated LV filling pressure; moderate   LAE; mild RAE; calcified aortic valve with mild AI; prolapse of   anterior MV leaflet with moderate MR; mild TR; doppler evidence   of residual perimembranous VSD with peak velocity of 5.8 m/s.   Compared to 02/07/13, LV function has mildly decreased and MR now   moderate.  Echo 02/01/2016: Left ventricle: The cavity size was normal. There was mild   concentric hypertrophy. Systolic function was normal. The   estimated ejection fraction was in the range of 60% to 65%. Wall   motion was normal; there were no regional wall motion   abnormalities. Left ventricular diastolic function parameters   were normal. - Aortic valve: There was mild regurgitation. - Mitral valve: There was mild to moderate regurgitation. - Left atrium: The atrium was normal in size. - Right ventricle: Systolic function was normal. - Pulmonary arteries: Systolic pressure was mildly elevated. PA   peak pressure: 36 mm Hg (S).   Impressions:   - No significant VSD visualized.  CT Chest 01/28/2016: Ascending thoracic aorta measures 4 cm in diameter. Recommend annual imaging followup by CTA or MRA. This recommendation follows 2010 ACCF/AHA/AATS/ACR/ASA/SCA/SCAI/SIR/STS/SVM Guidelines for the Diagnosis and Management of Patients with Thoracic Aortic Disease. Circulation. 2010; 121: [id number] 2. Cardiomegaly.  ASSESSMENT AND PLAN: Atrial fibrillation- s/p MAZE.      recurrent afib S/p cardioversion  in sinus  rhythm.   Continue Metoprolol and Eliquis.  CAD s/p CABG with SVG to RCA No symptoms. Cont medical therapy.  Mitral regurgitation Mitral valve prolapse - has mild - moderate MR  Echo from 02/01/2016 reveal stable MR.  Continue BP control. reeval with echo.  Aorta dilatation  4.3cm on echo 01/01/2015 4cm per CT 01/2016. Rec reimage in 01/2017 with CT.  Hypertension BP is well controlled. Continue monitoring BP. Continue current medical therapy and lifestyle changes.   Hyperlipidemia PCP ff labs. Cont statin therapy. LDL goal < 70. Patient is supposed to  go back to PCP soon. Recommend that had he gets CMP and fasting lipid panel.  Ventricular septal defect s/p closure Murmur still notable. Recommend continue to monitor with echocardiogram. Recent echo 01/2016 does not show significant VSD.   Bifascicular block Previously known.    Current medicines are reviewed at length with the patient today.  The patient does not have concerns regarding medicines.  Labs/ tests ordered today include:  Orders Placed This Encounter  Procedures  . CT ANGIO CHEST AORTA W &/OR WO CONTRAST  . EKG 12-Lead  . ECHOCARDIOGRAM COMPLETE    I had a lengthy and detailed discussion with the patient regarding diagnoses, prognosis, diagnostic options, treatment options , and side effects of medications.   I counseled the patient on importance of lifestyle modification including heart healthy diet, regular physical activity .   Disposition:   FU withCardiology in 6 months  I spent at least 25 minutes with the patient today and more than 50% of the time was spent counseling the patient and coordinating care.    Signed, Wende Bushy, MD  10/11/2016 5:13 PM    [place]  This note was generated in part with voice recognition software and I apologize for any typographical errors that were not detected and corrected.

## 2016-10-11 NOTE — Patient Instructions (Addendum)
Testing/Procedures: Your physician has requested that you have an echocardiogram in August 2018. Echocardiography is a painless test that uses sound waves to create images of your heart. It provides your doctor with information about the size and shape of your heart and how well your heart's chambers and valves are working. This procedure takes approximately one hour. There are no restrictions for this procedure.  In August 2018 she would like you to have a Non-Cardiac CT Angiography (CTA), which is a special type of CT scan that uses a computer to produce multi-dimensional views of major blood vessels throughout the body. In CT angiography, a contrast material is injected through an IV to help visualize the blood vessels   Follow-Up: Your physician wants you to follow-up in: 6 months with Dr. Rockey Situ. You will receive a reminder letter in the mail two months in advance. If you don't receive a letter, please call our office to schedule the follow-up appointment.  It was a pleasure seeing you today here in the office. Please do not hesitate to give [place] a call back if you have any further questions. [place], BSN

## 2016-12-22 ENCOUNTER — Telehealth: Payer: Self-pay | Admitting: Cardiology

## 2016-12-22 NOTE — Telephone Encounter (Signed)
Spoke with patient and let him know that Dr. Fletcher Anon reviewed and just advised that he should schedule appointment in a week. Patient and his wife report that it runs faster when he is up moving around. They were both agreeable with plan for appointment and had no further questions at this time. Let them know to call back for scheduling on Tuesday if they do not hear from anyone.

## 2016-12-22 NOTE — Telephone Encounter (Signed)
Left voicemail message to call back  

## 2016-12-22 NOTE — Telephone Encounter (Signed)
Patient had ekg at [facility] this morning and was told to notify us of the results.  Placed copy in nurse box.

## 2016-12-25 ENCOUNTER — Telehealth: Payer: Self-pay

## 2016-12-29 ENCOUNTER — Encounter: Payer: Self-pay | Admitting: Student

## 2016-12-29 ENCOUNTER — Ambulatory Visit (INDEPENDENT_AMBULATORY_CARE_PROVIDER_SITE_OTHER): Payer: Managed Care, Other (non HMO) | Admitting: Student

## 2016-12-29 VITALS — BP 110/60 | HR 107 | Ht 72.0 in | Wt 210.5 lb

## 2016-12-29 DIAGNOSIS — I10 Essential (primary) hypertension: Secondary | ICD-10-CM | POA: Diagnosis not present

## 2016-12-29 DIAGNOSIS — Q210 Ventricular septal defect: Secondary | ICD-10-CM | POA: Diagnosis not present

## 2016-12-29 DIAGNOSIS — E785 Hyperlipidemia, unspecified: Secondary | ICD-10-CM

## 2016-12-29 DIAGNOSIS — I2510 Atherosclerotic heart disease of native coronary artery without angina pectoris: Secondary | ICD-10-CM | POA: Diagnosis not present

## 2016-12-29 DIAGNOSIS — R000 Tachycardia, unspecified: Secondary | ICD-10-CM | POA: Diagnosis not present

## 2016-12-29 DIAGNOSIS — I480 Paroxysmal atrial fibrillation: Secondary | ICD-10-CM | POA: Diagnosis not present

## 2016-12-29 MED ORDER — METOPROLOL TARTRATE 25 MG PO TABS: 25.0000 mg | ORAL_TABLET | Freq: Two times a day (BID) | ORAL | 3 refills | Status: DC

## 2016-12-29 NOTE — Progress Notes (Signed)
Cardiology Office Note    Date:  12/29/2016   ID:  [name], DOB 1946-04-25, MRN [id number]  PCP:  Madelyn Brunner, MD  Cardiologist: Previously Dr. Yvone Neu --> Wishes to follow with Dr. Rockey Situ (his wife's Cardiologist)  Chief Complaint  Patient presents with  . Follow-up    Pt. c/o elevated heart rate. Meds reviewed by the pt. verbally.     History of Present Illness:    Aaron Jones is a 71 y.o. male with past medical history of CAD (s/p CABG with SVG-RCA in 01/2013 in the setting of VSD repair), paroxysmal atrial fibrillation (s/p MAZE procedure in 01/2016 with recurrent Afib requiring DCCV), HTN, HLD, and VSD (s/p repair in 01/2013) who presents to the office today for evaluation of tachycardia.   He was last examined by Dr. Yvone Neu in 10/2016 and reported doing well from a cardiac perspective at that time. He reported being very active at baseline and denied any exertional symptoms. Echo in 01/2016 showed a preserved EF of 60-65%, no regional WMA, mild AI, moderate MR, and no VSD visualized.   He was recently evaluated by his PCP and noted to be tachycardiac with HR in the 110's, therefore he was informed to follow-up with Cardiology. He denied any symptoms at that time. Labs were checked on 12/15/2016 and showed a WBC of 6.4, Hgb 14.1, platelets 271, Na+ 140, K+ 4.7, creatinine 0.9, Hgb A1c 8.0, and TSH 0.901. Lipid Panel showed total cholesterol of 117, HDL 31, Triglycerides 104, and LDL 65.  In talking with the patient today, he denies any recent chest pain or palpitations. He works 10-hour days and performs heavy lifting on a regular basis and does not experience exertional symptoms with this. No recent orthopnea, PND, lower extremity edema, lightheadedness, dizziness, or presyncope. He does report more generalized fatigue over the past few weeks but attributed this to "old age".  He does not check his HR or BP regularly at home but does have a pulse oximeter to monitor HR  and O2 saturations. BP is at 110/60 during today's visit with HR at 107 initially (rechecked and 110).    Past Medical History:  Diagnosis Date  . Atrial fibrillation with rapid ventricular response ([place]) 02/09/13   a. 01/2013 s/p R & L Maze and LAA clipping in setting of VSD repair;  b. On amiodarone/Apixaban;  c. 01/2013 Recurrent afib requiring DCCV.  Marland Kitchen COPD (chronic obstructive pulmonary disease) ([place])   . Coronary artery disease    a. 01/2013 s/p CABG x 1 (VG->PDA) in setting of VSD repair.  Marland Kitchen GERD (gastroesophageal reflux disease)   . Hyperlipidemia   . Hypertension    PCP- Kathlynn Grate, phone; [phone]  . Kidney stones    "6-7 times; they passed on their own" (Feb 09, 2013)  . Sleep apnea    [facility]- in process of being evaluated now, states 12 yrs. ago was on CPAP but after having his deviated septum repaired, he had put the CPAP in storage. Pt. will get a new machine soon.   . Tachycardia, paroxysmal [facility]) May 2013   Possible SVT  . Type II diabetes mellitus ([place])   . Umbilical hernia    "not repaired" (Feb 09, 2013)  . VSD (ventricular septal defect)    a. 01/2013 s/p VSD closure (dacron patch)    Past Surgical History:  Procedure Laterality Date  . CARDIAC CATHETERIZATION  > 5 yr ago   Done at [facility], reportedly clean  . CARDIAC  CATHETERIZATION  11/2012  . CARDIOVERSION N/A 02/12/2015   Procedure: CARDIOVERSION;  Surgeon: Thayer Headings, MD;  Location: [facility] ENDOSCOPY;  Service: Cardiovascular;  Laterality: N/A;  . CLIPPING OF ATRIAL APPENDAGE N/A 01/07/2013   Procedure: CLIPPING OF ATRIAL APPENDAGE;  Surgeon: Grace Isaac, MD;  Location: [place];  Service: Open Heart Surgery;  Laterality: N/A;  . CORONARY ARTERY BYPASS GRAFT N/A 01/07/2013   Procedure: CORONARY ARTERY BYPASS GRAFTING (CABG);  Surgeon: Grace Isaac, MD;  Location: [place];  Service: Open Heart Surgery;  Laterality: N/A;  Times1 using endoscopically harvested saphenous vein graft to the PDA  . EYE  SURGERY Left 1990's   "clipped muscle so it wouldn't be pulling up" (01/16/2013)  . INTRAOPERATIVE TRANSESOPHAGEAL ECHOCARDIOGRAM N/A 01/07/2013   Procedure: INTRAOPERATIVE TRANSESOPHAGEAL ECHOCARDIOGRAM;  Surgeon: Grace Isaac, MD;  Location: [facility];  Service: Open Heart Surgery;  Laterality: N/A;  . LEFT AND RIGHT HEART CATHETERIZATION WITH CORONARY ANGIOGRAM N/A 11/20/2012   Procedure: LEFT AND RIGHT HEART CATHETERIZATION WITH CORONARY ANGIOGRAM;  Surgeon: Jolaine Artist, MD;  Location: [facility] CATH LAB;  Service: Cardiovascular;  Laterality: N/A;  . MAZE N/A 01/07/2013   Procedure: MAZE;  Surgeon: Grace Isaac, MD;  Location: [place];  Service: Open Heart Surgery;  Laterality: N/A;  . MULTIPLE EXTRACTIONS WITH ALVEOLOPLASTY N/A 12/11/2012   Procedure: Extraction of tooth #'s 0,9,9,8,33,82,50,53,97,67,34,19,37,90,24 wioth alveoloplasty and bialteral fibrous tuberosity reductions.;  Surgeon: Lenn Cal, DDS;  Location: [facility] ORS;  Service: Oral Surgery;  Laterality: N/A;  . NASAL SEPTUM SURGERY  2004  . TEE WITHOUT CARDIOVERSION N/A 11/21/2012   Procedure: TRANSESOPHAGEAL ECHOCARDIOGRAM (TEE);  Surgeon: Jolaine Artist, MD;  Location: [facility] ENDOSCOPY;  Service: Cardiovascular;  Laterality: N/A;  . VSD REPAIR N/A 01/07/2013   Procedure: VENTRICULAR SEPTAL DEFECT (VSD) REPAIR;  Surgeon: Grace Isaac, MD;  Location: [place];  Service: Open Heart Surgery;  Laterality: N/A;    Current Medications: Outpatient Medications Prior to Visit  Medication Sig Dispense Refill  . apixaban (ELIQUIS) 5 MG TABS tablet Take 1 tablet (5 mg total) by mouth 2 (two) times daily. 180 tablet 3  . atorvastatin (LIPITOR) 20 MG tablet Take 1 tablet (20 mg total) by mouth at bedtime. 90 tablet 3  . furosemide (LASIX) 40 MG tablet TAKE 1 TABLET BY MOUTH EVERY DAY 30 tablet 1  . Glucose Blood (BLOOD GLUCOSE TEST STRIPS) STRP 1 strip by In Vitro route 2 (two) times daily. 100 each 0  . metFORMIN (GLUCOPHAGE) 1000 MG  tablet Take 1,000 mg by mouth 2 (two) times daily.  1  . metoprolol (LOPRESSOR) 100 MG tablet Take 1 tablet (100 mg total) by mouth 2 (two) times daily. 180 tablet 3  . omeprazole (PRILOSEC) 40 MG capsule Take 40 mg by mouth daily before breakfast.     . glipiZIDE (GLUCOTROL) 5 MG tablet Take 10 mg by mouth daily.   11   No facility-administered medications prior to visit.      Allergies:   Biaxin [clarithromycin]   Social History   Social History  . Marital status: Married    Spouse name: N/A  . Number of children: N/A  . 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 Smoking status: Never Smoker  . Smokeless tobacco: Never Used  . Alcohol use No  . Drug use: No  . Sexual activity: Not Currently  Other Topics Concern  . None   Social History Narrative   Still works full time. Works around the yard. Lives with wife. Has 2 sisters, neither with cardiac issues.     Family History:  The patient's family history includes Cancer in his mother; Diabetes in his mother.   Review of Systems:   Please see the history of present illness.     General:  No chills, fever, night sweats or weight changes. Positive for generalized fatigue.   Cardiovascular:  No chest pain, dyspnea on exertion, edema, orthopnea, palpitations, paroxysmal nocturnal dyspnea. Dermatological: No rash, lesions/masses Respiratory: No cough, dyspnea Urologic: No hematuria, dysuria Abdominal:   No nausea, vomiting, diarrhea, bright red blood per rectum, melena, or hematemesis Neurologic:  No visual changes, wkns, changes in mental status.  All other systems reviewed and are otherwise negative except as noted above.   Physical Exam:    VS:  BP 110/60 (BP Location: Left Arm, Patient Position: Sitting, Cuff Size: Normal)   Pulse (!) 107   Ht 6' (1.829 m)   Wt 210 lb 8 oz (95.5 kg)   BMI 28.55 kg/m    General: Well developed, well  nourished Caucasian male appearing in no acute distress. Head: Normocephalic, atraumatic, sclera non-icteric, no xanthomas, nares are without discharge.  Neck: No carotid bruits. JVD not elevated.  Lungs: Respirations regular and unlabored, without wheezes or rales.  Heart: Regular rhythm, tachycardiac rate. No S3 or S4.  No murmur, no rubs, or gallops appreciated. Abdomen: Soft, non-tender, non-distended with normoactive bowel sounds. No hepatomegaly. No rebound/guarding. No obvious abdominal masses. Msk:  Strength and tone appear normal for age. No joint deformities or effusions. Extremities: No clubbing or cyanosis. No lower extremity edema.  Distal pedal pulses are 2+ bilaterally. Neuro: Alert and oriented X 3. Moves all extremities spontaneously. No focal deficits noted. Psych:  Responds to questions appropriately with a normal affect. Skin: No rashes or lesions noted  Wt Readings from Last 3 Encounters:  12/29/16 210 lb 8 oz (95.5 kg)  10/11/16 215 lb 4 oz (97.6 kg)  01/11/16 213 lb 12 oz (97 kg)      Studies/Labs Reviewed:   EKG:  EKG is ordered today. The ekg ordered today demonstrates sinus tachycardia, HR 107, with PVC's and known bifascicular block.   Recent Labs: 01/11/2016: ALT 11; BUN 15; Creatinine, Ser 0.85; Potassium 4.9; Sodium 142   Lipid Panel    Component Value Date/Time   CHOL 142 04/17/2013 0833   CHOL 192 10/29/2011 0429   TRIG 125 04/17/2013 0833   TRIG 150 10/29/2011 0429   HDL 41 04/17/2013 0833   HDL 32 (L) 10/29/2011 0429   CHOLHDL 3.5 04/17/2013 0833   VLDL 30 10/29/2011 0429   LDLCALC 76 04/17/2013 0833   LDLCALC 130 (H) 10/29/2011 0429    Additional studies/ records that were reviewed today include:   Echocardiogram: 02/01/2016 Study Conclusions  - Left ventricle: The cavity size was normal. There was mild   concentric hypertrophy. Systolic function was normal. The   estimated ejection fraction was in the range of 60% to 65%. Wall    motion was normal; there were no regional wall motion   abnormalities. Left ventricular diastolic function parameters   were normal. - Aortic valve: There was mild regurgitation. - Mitral valve: There was mild to moderate regurgitation. - Left atrium: The atrium was normal in size. - Right ventricle: Systolic function was normal. - Pulmonary arteries: Systolic pressure was mildly elevated. PA  peak pressure: 36 mm Hg (S).  Impressions:  - No significant VSD visualized.   Assessment:    1. Sinus tachycardia   2. Atherosclerosis of native coronary artery of native heart without angina pectoris   3. Paroxysmal atrial fibrillation (HCC)   4. Ventricular septal defect (VSD)   5. Benign essential HTN   6. Hyperlipidemia LDL goal <70      Plan:   In order of problems listed above:  1. Sinus Tachycardia - HR has been in the low-100's to 110's at his recent office visits. He has not checked his HR regularly at home. He denies any recent chest pain or palpitations but has experienced mild fatigue. - labs were checked by his PCP earlier this month and showed WBC of 6.4, Hgb 14.1, platelets 271, Na+ 140, K+ 4.7, creatinine 0.9, and TSH 0.901. No clear identifiable cause for his tachycardia.  - rechecked his HR after sitting and resting for over 10 minutes and still elevated at 110 bpm. Discussed with the patient and his wife that while he is not overly symptomatic with his elevated HR, the concern would be that his HR is even more elevated with activity and if remaining elevated for extended durations of time, this could lead to a tachycardia-induced cardiomyopathy.  - he has tolerated Lopressor well and denies any side-effects to this. Therefore, will increase Lopressor dosing to 125mg  BID. If HR remains elevated, could consider initiation of Cardizem. I have encouraged him to check his HR regularly at home. If this returns to < 100's at rest, he can reduce his Lopressor dosing back to  100mg  BID.   2. CAD - s/p CABG with SVG-RCA in 01/2013 in the setting of VSD repair. - he denies any recent chest pain or dyspnea on exertion.  - continue BB and statin therapy. No ASA secondary to need for Eliquis.   3. PAF - This patients CHA2DS2-VASc Score and unadjusted Ischemic Stroke Rate (% per year) is equal to 4.8 % stroke rate/year from a score of 4 (HTN, DM, Vascular, Age). He denies any evidence of active bleeding. Continue Eliquis 5mg  BID for anticoagulation.  - EKG today shows sinus tachycardia. Will increase Lopressor dosing from 100mg  BID to 125mg  BID.   4. VSD - s/p repair in 01/2013. - recent echo in 01/2016 showed no significant VSD.   5. HTN - BP is well-controlled at 110/60. - will adjust Lopressor dosing as above. I encouraged the patient to check his HR and BP regularly with his recent dose adjustment.   6. HLD - Lipid Panel in 12/2016 showed total cholesterol of 117, HDL 31, Triglycerides 104, and LDL 65. At goal with LDL < 70. - continue Atorvastatin 20mg  daily.    Medication Adjustments/Labs and Tests Ordered: Current medicines are reviewed at length with the patient today.  Concerns regarding medicines are outlined above.  Medication changes, Labs and Tests ordered today are listed in the Patient Instructions below. Patient Instructions  Medication Instructions:  Your physician has recommended you make the following change in your medication:  INCREASE metoprolol to 125mg  twice daily  Labwork: none  Testing/Procedures: none  Follow-Up: Your physician wants you to follow-up in: 6 months with Dr. Rockey Situ.  You will receive a reminder letter in the mail two months in advance. If you don't receive a letter, please call our office to schedule the follow-up appointment.  Any Other Special Instructions Will Be Listed Below (If Applicable). Please check your heart rate while at rest  three times a week and notify us if heart rate continues to be elevated.    If you need a refill on your cardiac medications before your next appointment, please call your pharmacy.    Signed, Erma Heritage, PA-C  12/29/2016 5:08 PM    [facility] [place], [place], [place]  [address] Phone: [phone]; Fax: [phone]  [address] [place], [place] [address] Phone: [phone]

## 2016-12-29 NOTE — Patient Instructions (Addendum)
Medication Instructions:  Your physician has recommended you make the following change in your medication:  INCREASE metoprolol to 125mg  twice daily   Labwork: none  Testing/Procedures: none  Follow-Up: Your physician wants you to follow-up in: 6 months with Dr. Rockey Situ.  You will receive a reminder letter in the mail two months in advance. If you don't receive a letter, please call our office to schedule the follow-up appointment.   Any Other Special Instructions Will Be Listed Below (If Applicable). Please check your heart rate while at rest three times a week and notify us if heart rate continues to be elevated.      If you need a refill on your cardiac medications before your next appointment, please call your pharmacy.

## 2017-01-09 ENCOUNTER — Telehealth: Payer: Self-pay | Admitting: Student

## 2017-01-09 NOTE — Telephone Encounter (Signed)
S/w patient's wife, ok per DPR. She says patient's HR was 108 this morning.  She is unsure if it was taken before or after taking metoprolol.  She says its been elevated some over the past 2 weeks as well.  She says patient has been tired lately.  Asked if patient was home, he is at work.  She said I could call him and I said that would help me to ask him some additional questions.  Called patient at work. He states he takes his heart rate at the same time he takes his medication each morning.  He is taking Metoprolol 125 mg by mouth twice a day as prescribed. Denies palpitations, chest pain, shortness of breath, dizziness or swelling. He says he does not feel abnormally tired. He works 10 hour days, then mows the grass or does other things around the house and by the time the evening comes he's tired.  He took a long nap on Sunday and then could not get to sleep Sunday night. So yesterday, Monday, he was more tired from not getting a full night's sleep. Patient could not take HR while at work. Said he's tried it manually before and just can't count the beats.  Advised him to bring pulse ox to work with him over the next few days. He will take HR before metoprolol and then about 2 hours later while at work and record the values and then call us with the readings. He verbalized understanding.

## 2017-01-09 NOTE — Telephone Encounter (Signed)
Pt spouse calling stating pt was in about 2 weeks ago  We increased patient metoprolol To help lower HR It is still running high she states  It was 108 this morning, it has been up to 111-112 Meaning no changes since last time they saw [place] Would like some advise on this Please call back

## 2017-01-19 NOTE — Telephone Encounter (Signed)
Spoke with patients wife and she said that her husbands heart rate has remained in the low 100's. Patient has not been monitoring his blood pressures and he has had some fatigue. Instructed her to have him monitor his blood pressures because with the last medication changes that can affect that as well. She was not aware of this and was appreciative for the information. Let her know that I would route this message to someone for their review but to give [place] a call back the first of next week with some blood pressure readings. She was agreeable with this plan and had no further questions at this time.

## 2017-01-19 NOTE — Telephone Encounter (Signed)
Left voicemail message to call back  

## 2017-01-19 NOTE — Telephone Encounter (Signed)
Pt came by  Has recently had his medication Metoprolol increased by 25 mg His heart rate was 107 and has seen no changes with heart rate still 107  Please call to advise

## 2017-01-26 ENCOUNTER — Other Ambulatory Visit: Payer: Self-pay | Admitting: Cardiovascular Disease

## 2017-01-26 DIAGNOSIS — I77819 Aortic ectasia, unspecified site: Secondary | ICD-10-CM

## 2017-01-26 DIAGNOSIS — Q210 Ventricular septal defect: Secondary | ICD-10-CM

## 2017-01-30 ENCOUNTER — Telehealth: Payer: Self-pay | Admitting: Cardiovascular Disease

## 2017-01-30 DIAGNOSIS — I4891 Unspecified atrial fibrillation: Secondary | ICD-10-CM

## 2017-01-30 MED ORDER — METOPROLOL TARTRATE 25 MG PO TABS: 50.0000 mg | ORAL_TABLET | Freq: Two times a day (BID) | ORAL | 0 refills | Status: DC

## 2017-01-30 NOTE — Telephone Encounter (Signed)
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

## 2017-01-30 NOTE — Telephone Encounter (Signed)
He may increase lopressor to 150 mg bid but ongoing tachycardia is concerning.  Can we pls have echo moved up to later this week with office f/u w/ Dr. Rockey Situ or one of [place] shortly thereafter?

## 2017-01-30 NOTE — Telephone Encounter (Signed)
Spoke with patients wife and he took meds last night then 2 hours later his BP was still high at 166/97. She reports some of these numbers are before taking his medications. She states that he tried to mow the grass yesterday and he started hurting in the upper part of his abdomen. She reports that he tires very easily now as well. She confirmed that he is taking metoprolol 125 mg twice daily. Will route this to NP for review and possible recommendations. Patient does have echocardiogram scheduled for 02/07/17 as well. She verbalized understanding of our conversation and will call her back with any further recommendations. She was appreciative for the call with no further questions or concerns at this time.

## 2017-01-30 NOTE — Telephone Encounter (Signed)
Spoke with patients wife per release form and reviewed instructions to increase metoprolol to 150 mg total. She verbalized understanding and let her know that I would be working on getting echocardiogram appointment scheduled along with follow up. She was agreeable with plan and had no further questions at this time.

## 2017-01-31 NOTE — Telephone Encounter (Signed)
Spoke with patients wife per release form and confirmed appointment for Friday at 3:00 PM with Ignacia Bayley NP. Patient also going for echocardiogram tomorrow at [facility] and instructed to arrive at 10:45AM to register. She verbalized understanding of all information with no further questions at this time.

## 2017-01-31 NOTE — Telephone Encounter (Signed)
I can see on Friday - 3pm.  I'm not opposed to Thursday but I worry the echo won't be read by the time of his appt.

## 2017-01-31 NOTE — Telephone Encounter (Signed)
Spoke with patients wife per release form and offered echocardiogram to be done over at [facility] tomorrow at 11:00 AM for her husband and she was agreeable with this time. Instructed her that he would need to go to the [facility] Entrance and check in at the front desk. She confirmed this information and had no further questions at this time. Let her know that I would give her a call back with follow up appointment information. She was appreciative for the call and had no further questions at this time.

## 2017-01-31 NOTE — Telephone Encounter (Signed)
error 

## 2017-02-01 ENCOUNTER — Ambulatory Visit
Admission: RE | Admit: 2017-02-01 | Discharge: 2017-02-01 | Disposition: A | Discharge status: Home / Self Care | Payer: Managed Care, Other (non HMO) | Source: Ambulatory Visit | Attending: Nurse Practitioner | Admitting: Nurse Practitioner

## 2017-02-01 DIAGNOSIS — E785 Hyperlipidemia, unspecified: Secondary | ICD-10-CM | POA: Insufficient documentation

## 2017-02-01 DIAGNOSIS — K219 Gastro-esophageal reflux disease without esophagitis: Secondary | ICD-10-CM | POA: Diagnosis not present

## 2017-02-01 DIAGNOSIS — I340 Nonrheumatic mitral (valve) insufficiency: Secondary | ICD-10-CM | POA: Diagnosis not present

## 2017-02-01 DIAGNOSIS — I361 Nonrheumatic tricuspid (valve) insufficiency: Secondary | ICD-10-CM | POA: Diagnosis not present

## 2017-02-01 DIAGNOSIS — I350 Nonrheumatic aortic (valve) stenosis: Secondary | ICD-10-CM | POA: Insufficient documentation

## 2017-02-01 DIAGNOSIS — I517 Cardiomegaly: Secondary | ICD-10-CM | POA: Insufficient documentation

## 2017-02-01 DIAGNOSIS — R000 Tachycardia, unspecified: Secondary | ICD-10-CM | POA: Diagnosis not present

## 2017-02-01 DIAGNOSIS — G4730 Sleep apnea, unspecified: Secondary | ICD-10-CM | POA: Diagnosis not present

## 2017-02-01 DIAGNOSIS — J449 Chronic obstructive pulmonary disease, unspecified: Secondary | ICD-10-CM | POA: Insufficient documentation

## 2017-02-01 DIAGNOSIS — I2510 Atherosclerotic heart disease of native coronary artery without angina pectoris: Secondary | ICD-10-CM | POA: Diagnosis not present

## 2017-02-01 DIAGNOSIS — E119 Type 2 diabetes mellitus without complications: Secondary | ICD-10-CM | POA: Insufficient documentation

## 2017-02-01 DIAGNOSIS — I4891 Unspecified atrial fibrillation: Secondary | ICD-10-CM | POA: Diagnosis present

## 2017-02-01 NOTE — Progress Notes (Signed)
*  PRELIMINARY RESULTS* Echocardiogram 2D Echocardiogram has been performed.  Sherrie Sport 02/01/2017, 11:27 AM

## 2017-02-02 ENCOUNTER — Ambulatory Visit (INDEPENDENT_AMBULATORY_CARE_PROVIDER_SITE_OTHER): Payer: Managed Care, Other (non HMO) | Admitting: Nurse Practitioner

## 2017-02-02 ENCOUNTER — Encounter: Payer: Self-pay | Admitting: Nurse Practitioner

## 2017-02-02 VITALS — BP 116/68 | HR 108 | Ht 72.0 in | Wt 211.5 lb

## 2017-02-02 DIAGNOSIS — I471 Supraventricular tachycardia: Secondary | ICD-10-CM | POA: Diagnosis not present

## 2017-02-02 DIAGNOSIS — I2510 Atherosclerotic heart disease of native coronary artery without angina pectoris: Secondary | ICD-10-CM

## 2017-02-02 DIAGNOSIS — E782 Mixed hyperlipidemia: Secondary | ICD-10-CM

## 2017-02-02 DIAGNOSIS — I10 Essential (primary) hypertension: Secondary | ICD-10-CM | POA: Diagnosis not present

## 2017-02-02 DIAGNOSIS — I480 Paroxysmal atrial fibrillation: Secondary | ICD-10-CM

## 2017-02-02 NOTE — Patient Instructions (Signed)
Follow-Up: Your physician recommends that you schedule a follow-up appointment in: 1 week with Dr. Lovena Le Friday at 2:45 PM.    It was a pleasure seeing you today here in the office. Please do not hesitate to give [place] a call back if you have any further questions. [place], BSN

## 2017-02-02 NOTE — Progress Notes (Signed)
Inserted 20 Gauge IV to right forearm with blood return and flushed with no problems. 0.9% normal saline hung and dripped by gravity. Pads placed on patient and monitored by both 12 lead EKG and zoll. Administered 6 mg adenosine with no results. Then 12 mg adenosine administered and patient did have decrease in rate which was captured on rhythm strips via 12 lead. IV flushed and patient monitored.   IV removed, catheter intact, dressing applied and patient had no complaints or symptoms at this time. After visit summary reviewed and he ambulated with no signs of distress. Appointment information provided to patient and rhythm scanned into system.

## 2017-02-02 NOTE — Progress Notes (Signed)
Office Visit    Patient Name: Aaron Jones Date of Encounter: 02/02/2017  Primary Care Provider:  Madelyn Brunner, MD Primary Cardiologist:  Johnny Bridge, MD (formerly A. Yvone Neu, MD)  Chief Complaint    71 year old ? With a history of CAD status post CABG 1, ventricular septal defect status post repair, postoperative paroxysmal atrial fibrillation/flutter, hypertension, hyperlipidemia, paroxysmal atrial tachycardia, diabetes, COPD, sleep apnea, and GERD, who presents for follow-up related to persistent tachycardia over the past month.  Past Medical History    Past Medical History:  Diagnosis Date  . Atrial fibrillation with rapid ventricular response ([place]) 01/16/2013   a. 01/2013 s/p R & L Maze and LAA clipping in setting of VSD repair;  b. Prev on amiodarone;  c. CHA2DS2VASc = 4-->Apixaban;  c. 01/2013 Recurrent afib/flutter requiring DCCV.  Marland Kitchen COPD (chronic obstructive pulmonary disease) ([place])   . Coronary artery disease    a. 01/2013 s/p CABG x 1 (VG->PDA) in setting of VSD repair.  Marland Kitchen GERD (gastroesophageal reflux disease)   . Hyperlipidemia   . Hypertension    PCP- Kathlynn Grate, phone; [phone]  . PAT (paroxysmal atrial tachycardia) ([place])    a. 02/2015 s/p DCCV;  b. 12/2016 recurrent PAT - unchanged with escalating BB dose (rates 106-107).  Marland Kitchen PSVT (paroxysmal supraventricular tachycardia) ([place])    a. 10/2011 Admitted to [facility] w/ SVT-->broke with IV dilt.  . Recurrent Nephrolithiasis   . Sleep apnea    [facility]- in process of being evaluated now, states 12 yrs. ago was on CPAP but after having his deviated septum repaired, he had put the CPAP in storage. Pt. will get a new machine soon.   . Thoracic aortic aneurysm ([place])    a. 01/2016 CTA chest: 4.0 cm Asc Ao; b. 01/2017 Echo: nl Ao root size.  . Type II diabetes mellitus ([place])   . Umbilical hernia    "not repaired" (01/16/2013)  . VSD (ventricular septal defect) w/ persistent systolic murmur    a. 09/4008 s/p VSD closure  (dacron patch);  b. 01/2017 Echo: EF 50-55%, no rwma, mild AS, mild MR, mod dil LA, dilated RV, sev dil RA, mild TR. PASP nl.   Past Surgical History:  Procedure Laterality Date  . CARDIAC CATHETERIZATION  > 5 yr ago   Done at [facility], reportedly clean  . CARDIAC CATHETERIZATION  11/2012  . CARDIOVERSION N/A 02/12/2015   Procedure: CARDIOVERSION;  Surgeon: Thayer Headings, MD;  Location: [facility] ENDOSCOPY;  Service: Cardiovascular;  Laterality: N/A;  . CLIPPING OF ATRIAL APPENDAGE N/A 01/07/2013   Procedure: CLIPPING OF ATRIAL APPENDAGE;  Surgeon: Grace Isaac, MD;  Location: [place];  Service: Open Heart Surgery;  Laterality: N/A;  . CORONARY ARTERY BYPASS GRAFT N/A 01/07/2013   Procedure: CORONARY ARTERY BYPASS GRAFTING (CABG);  Surgeon: Grace Isaac, MD;  Location: [place];  Service: Open Heart Surgery;  Laterality: N/A;  Times1 using endoscopically harvested saphenous vein graft to the PDA  . EYE SURGERY Left 1990's   "clipped muscle so it wouldn't be pulling up" (01/16/2013)  . INTRAOPERATIVE TRANSESOPHAGEAL ECHOCARDIOGRAM N/A 01/07/2013   Procedure: INTRAOPERATIVE TRANSESOPHAGEAL ECHOCARDIOGRAM;  Surgeon: Grace Isaac, MD;  Location: [place];  Service: Open Heart Surgery;  Laterality: N/A;  . LEFT AND RIGHT HEART CATHETERIZATION WITH CORONARY ANGIOGRAM N/A 11/20/2012   Procedure: LEFT AND RIGHT HEART CATHETERIZATION WITH CORONARY ANGIOGRAM;  Surgeon: Jolaine Artist, MD;  Location: [facility] CATH LAB;  Service: Cardiovascular;  Laterality: N/A;  .  MAZE N/A 01/07/2013   Procedure: MAZE;  Surgeon: Grace Isaac, MD;  Location: [place];  Service: Open Heart Surgery;  Laterality: N/A;  . MULTIPLE EXTRACTIONS WITH ALVEOLOPLASTY N/A 12/11/2012   Procedure: Extraction of tooth #'s 4,2,3,5,36,14,43,15,40,08,67,61,95,09,32 wioth alveoloplasty and bialteral fibrous tuberosity reductions.;  Surgeon: Lenn Cal, DDS;  [facility];  Service: Oral Surgery;  Laterality: N/A;  . NASAL SEPTUM SURGERY   2004  . TEE WITHOUT CARDIOVERSION N/A 11/21/2012   Procedure: TRANSESOPHAGEAL ECHOCARDIOGRAM (TEE);  Surgeon: Jolaine Artist, MD;  Location: [facility] ENDOSCOPY;  Service: Cardiovascular;  Laterality: N/A;  . VSD REPAIR N/A 01/07/2013   Procedure: VENTRICULAR SEPTAL DEFECT (VSD) REPAIR;  Surgeon: Grace Isaac, MD;  Location: [place];  Service: Open Heart Surgery;  Laterality: N/A;    Allergies  Allergies  Allergen Reactions  . Biaxin [Clarithromycin] Nausea Only    History of Present Illness    71 y/o ? With the above complex past medical history. In May 2013, he was evaluated at [facility] the setting of tachycardia with rates in the 160s. This was felt to be supraventricular tachycardia and was treated with diltiazem with subsequent restoration of sinus rhythm. Subsequent workup led to discovery of a ventricular septal defect. Subsequent catheterization showed severe ostial right coronary artery disease. He was referred to thoracic surgery and subsequently underwent repair of ventricular septal defect with Dacron patch as well as coronary artery bypass grafting 1 with a vein graft  RPDA.   bilateral maze and left atrial appendage clipping was also performed. Patient did have postoperative A. fib and flutter and did require cardioversion and initiation of amiodarone in August 2014. He has been on oral anticoagulation with eliquis ever since then.   His VSD has been stable since surgery, though he continues to have a loud systolic murmur. At some point, amiodarone was discontinued. In August 2016, he was experiencing recurrent tachycardias. This was felt to represent persistent atrial tachycardia with 2:1 conduction.  Decision was made to pursue cardioversion since it had been 2 years since prior arrhythmias. It was felt that he may require antiarrhythmics and/or ablation in the future for recurrent arrhythmias.  Patient had been doing well but was recently noted by primary care to be  tachycardic. Labs were performed and unrevealing. He was referred back to cardiology in July where ECG showed tachycardia with a rate of 107 bpm.  This was initially felt to represent sinus tachycardia and his beta blocker dose was adjusted and he and his wife are advised to contact us if rates did not improve. Patient says that he has noted some dyspnea on exertion with persistent tachycardia ever since that visit. His wife called the other day and alerted as to ongoing tachycardia with rates less than 110. In that setting, we ordered an echocardiogram to be performed as soon as possible. This was performed yesterday at [facility] and showed normal LV function. He was tachycardic throughout in what was felt to most likely represent atrial tachycardia. On presentation today, he is in a 2-1 atrial tachycardia with right bundle branch block. He is asymptomatic at rest. He denies any recent chest pain, PND, orthopnea, dizziness, syncope, edema, or early satiety. As noted above, he has had some degree of dyspnea on exertion with reduction in exercise tolerance, though he is clear that this has not been profound.  Home Medications    Prior to Admission medications   Medication Sig Start Date End Date Taking? Authorizing Provider  apixaban (ELIQUIS) 5 MG TABS tablet Take 1 tablet (5 mg total) by mouth 2 (two) times daily. 01/11/16  Yes Wende Bushy, MD  atorvastatin (LIPITOR) 20 MG tablet Take 1 tablet (20 mg total) by mouth at bedtime. 01/11/16  Yes Wende Bushy, MD  furosemide (LASIX) 40 MG tablet TAKE 1 TABLET BY MOUTH EVERY DAY   Yes Nahser, Wonda Cheng, MD  glipiZIDE (GLUCOTROL) 10 MG tablet Take 10 mg by mouth daily. 12/22/16 12/22/17 Yes [provider]  Glucose Blood (BLOOD GLUCOSE TEST STRIPS) STRP 1 strip by In Vitro route 2 (two) times daily. 11/25/12  Yes Lelon Perla, MD  metFORMIN (GLUCOPHAGE) 1000 MG tablet Take 1,000 mg by mouth 2 (two) times daily. 01/16/15  Yes [provider]  metoprolol (LOPRESSOR) 100 MG tablet Take 1 tablet (100 mg total) by mouth 2 (two) times daily. 01/11/16  Yes Wende Bushy, MD  metoprolol tartrate (LOPRESSOR) 25 MG tablet Take 2 tablets (50 mg total) by mouth 2 (two) times daily. Total dosage should be Metoprolol Tartrate 150 mg twice a day 01/30/17 04/30/17 Yes Rogelia Mire, NP  omeprazole (PRILOSEC) 40 MG capsule Take 40 mg by mouth daily before breakfast.    Yes [provider]    Review of Systems    As above, he has had some dyspnea on exertion. He denies chest pain, palpitations, PND, orthopnea, dizziness, syncope, edema, or early satiety.  All other systems reviewed and are otherwise negative except as noted above.  Physical Exam    VS:  BP 116/68 (BP Location: Left Arm, Patient Position: Sitting, Cuff Size: Normal)   Pulse (!) 108   Ht 6' (1.829 m)   Wt 211 lb 8 oz (95.9 kg)   BMI 28.68 kg/m  , BMI Body mass index is 28.68 kg/m. GEN: Well nourished, well developed, in no acute distress.  HEENT: normal.  Neck: Supple, no JVD, carotid bruits, or masses. Cardiac: RRR, Tachycardic, 3/6 holosystolic murmur loudest at the left lower sternal border but heard throughout, no rubs, or gallops. No clubbing, cyanosis, edema.  Radials/DP/PT 2+ and equal bilaterally.  Respiratory:  Respirations regular and unlabored, clear to auscultation bilaterally. GI: Soft, nontender, nondistended, BS + x 4. MS: no deformity or atrophy. Skin: warm and dry, no rash. Neuro:  Strength and sensation are intact. Psych: Normal affect.  Accessory Clinical Findings    ECG - Atrial tachycardia with 2:1 conduction, left axis deviation, left anterior fascicular block, right bundle branch block, LVH, PVCs.  12-lead rhythm strip performed while patient given adenosine 6 mg IV 1 without change in heart rate. This was followed by adenosine 12 mg IV 1 with brief slowing of ventricular conduction and ongoing atrial activity at a  cycle length of 280 ms, prior to resumption of 2-1 atrial tachycardia at a rate of 107 bpm.  Assessment & Plan    1.  Persistent atrial tachycardia: Patient has a prior history of atrial arrhythmias including atrial fibrillation and flutter dating back to August 2014 following VSD repair, CABG 1, bilateral maze, and left atrial appendage clipping. He has been on oral anticoagulation with eliquis 5 mg twice a day ever since then. He did require cardioversion in August 2014 for A. fib/flutter, and again in September 2016, after developing 2:1 atrial tachycardia.  He has been maintained on beta blocker therapy but was noted to be tachycardic earlier this summer and was seen in our office in July. Beta blocker was titrated at that time and patient  has noted ongoing tachycardia at home with rates generally less than 110. He has not had any chest pain, but has had some degree of dyspnea on exertion. He continues to work full-time without significant limitations however. Echocardiogram was performed on August 30, showing normal LV function. I have reviewed his case with Dr. Rockey Situ and also Dr. Lovena Le today. During his visit today, we performed 12-lead rhythm strip while administering adenosine. He did not respond at all to 6 mg of adenosine. Following 12 mg of adenosine however, his ventricular rate did slow and he had persistent atrial tachycardia at a cycle length of 280 ms. Patient tolerated adenosine administration well. I will continue beta blocker therapy and we have arranged for electrophysiology follow-up with Dr. Lovena Le next week for consideration of AAD vs ablative therapy.  2. Coronary artery disease: Status post single-vessel bypass at the time of his VSD repair in 2014. He has not been having any chest pain despite ongoing elevated heart rates. He has had some change in exercise tolerance. LV function was normal. Plan to continue statin and beta blocker at this time. No aspirin in the setting of  long-term eliquis therapy.  3. Paroxysmal atrial fibrillation and flutter: Status post maze and left atrial appendage clipping in 2014. He is chronically anticoagulated with eliquis. Continue beta blocker.  4. Essential hypertension: Blood pressure stable on beta blocker therapy.  5. Hyperlipidemia:  LDL was 65 in July of this year. LFTs were normal at that time.  6. History of ventricular septal defect: Status post repair in 2014. No significant flow noted on echo yesterday.  7. Dilated aortic root: Previous measurement of 4 cm on CT angiogram of the chest in August 2017. Echocardiogram yesterday showed normal aortic root.  8. Disposition: Patient will follow up with electrophysiology next week.  Murray Hodgkins, NP 02/02/2017, 5:11 PM

## 2017-02-06 ENCOUNTER — Encounter: Payer: Self-pay | Admitting: Internal Medicine

## 2017-02-06 ENCOUNTER — Ambulatory Visit (INDEPENDENT_AMBULATORY_CARE_PROVIDER_SITE_OTHER): Payer: Managed Care, Other (non HMO) | Admitting: Internal Medicine

## 2017-02-06 VITALS — BP 132/62 | HR 109 | Ht 72.0 in | Wt 214.2 lb

## 2017-02-06 DIAGNOSIS — I4892 Unspecified atrial flutter: Secondary | ICD-10-CM

## 2017-02-06 DIAGNOSIS — R002 Palpitations: Secondary | ICD-10-CM

## 2017-02-06 NOTE — Progress Notes (Signed)
HPI Mr. Faiola is referred today by Ignacia Bayley, NP for evaluation of atrial tachy/flutter/fib. He is a pleasant 71 yo man with a VSD, s/p repair, CAD, s/p CABG, post op atrial fib/flutter, s/p DCCV. He has been maintained on Eliquis. He was seen in the clinic with atrial fib/flutter with a RVR and is referred for additional evaluation. He has an atrial rate of around 220 and 2:1 AV block at 105-110/bpm. Since then hs has done well. He does not have palpitations and has had no syncope and is very active with no limitation to his activity. No chest pain or sob. He used to be able to feel his heart beat but does not now.  Allergies  Allergen Reactions  . Biaxin [Clarithromycin] Nausea Only     Current Outpatient Prescriptions  Medication Sig Dispense Refill  . apixaban (ELIQUIS) 5 MG TABS tablet Take 1 tablet (5 mg total) by mouth 2 (two) times daily. 180 tablet 3  . atorvastatin (LIPITOR) 20 MG tablet Take 1 tablet (20 mg total) by mouth at bedtime. 90 tablet 3  . furosemide (LASIX) 40 MG tablet TAKE 1 TABLET BY MOUTH EVERY DAY 30 tablet 1  . glipiZIDE (GLUCOTROL) 10 MG tablet Take 10 mg by mouth daily.    . Glucose Blood (BLOOD GLUCOSE TEST STRIPS) STRP 1 strip by In Vitro route 2 (two) times daily. 100 each 0  . metFORMIN (GLUCOPHAGE) 1000 MG tablet Take 1,000 mg by mouth 2 (two) times daily.  1  . metoprolol (LOPRESSOR) 100 MG tablet Take 1 tablet (100 mg total) by mouth 2 (two) times daily. 180 tablet 3  . metoprolol tartrate (LOPRESSOR) 25 MG tablet Take 2 tablets (50 mg total) by mouth 2 (two) times daily. Total dosage should be Metoprolol Tartrate 150 mg twice a day 360 tablet 0  . omeprazole (PRILOSEC) 40 MG capsule Take 40 mg by mouth daily before breakfast.      No current facility-administered medications for this visit.      Past Medical History:  Diagnosis Date  . Atrial fibrillation with rapid ventricular response ([place]) 01/16/2013   a. 01/2013 s/p R & L Maze and LAA  clipping in setting of VSD repair;  b. Prev on amiodarone;  c. CHA2DS2VASc = 4-->Apixaban;  c. 01/2013 Recurrent afib/flutter requiring DCCV.  Marland Kitchen COPD (chronic obstructive pulmonary disease) ([place])   . Coronary artery disease    a. 01/2013 s/p CABG x 1 (VG->PDA) in setting of VSD repair.  Marland Kitchen GERD (gastroesophageal reflux disease)   . Hyperlipidemia   . Hypertension    PCP- Kathlynn Grate, phone; [phone]  . PAT (paroxysmal atrial tachycardia) ([place])    a. 02/2015 s/p DCCV;  b. 12/2016 recurrent PAT - unchanged with escalating BB dose (rates 106-107).  Marland Kitchen PSVT (paroxysmal supraventricular tachycardia) ([place])    a. 10/2011 Admitted to [facility] w/ SVT-->broke with IV dilt.  . Recurrent Nephrolithiasis   . Sleep apnea    [facility]- in process of being evaluated now, states 12 yrs. ago was on CPAP but after having his deviated septum repaired, he had put the CPAP in storage. Pt. will get a new machine soon.   . Thoracic aortic aneurysm ([place])    a. 01/2016 CTA chest: 4.0 cm Asc Ao; b. 01/2017 Echo: nl Ao root size.  . Type II diabetes mellitus ([place])   . Umbilical hernia    "not repaired" (01/16/2013)  . VSD (ventricular septal defect) w/ persistent systolic murmur  a. 01/2013 s/p VSD closure (dacron patch);  b. 01/2017 Echo: EF 50-55%, no rwma, mild AS, mild MR, mod dil LA, dilated RV, sev dil RA, mild TR. PASP nl.    ROS:   All systems reviewed and negative except as noted in the HPI.   Past Surgical History:  Procedure Laterality Date  . CARDIAC CATHETERIZATION  > 5 yr ago   Done at [facility], reportedly clean  . CARDIAC CATHETERIZATION  11/2012  . CARDIOVERSION N/A 02/12/2015   Procedure: CARDIOVERSION;  Surgeon: Thayer Headings, MD;  Location: [facility] ENDOSCOPY;  Service: Cardiovascular;  Laterality: N/A;  . CLIPPING OF ATRIAL APPENDAGE N/A 01/07/2013   Procedure: CLIPPING OF ATRIAL APPENDAGE;  Surgeon: Grace Isaac, MD;  Location: [place];  Service: Open Heart Surgery;  Laterality: N/A;  . CORONARY  ARTERY BYPASS GRAFT N/A 01/07/2013   Procedure: CORONARY ARTERY BYPASS GRAFTING (CABG);  Surgeon: Grace Isaac, MD;  Location: [place];  Service: Open Heart Surgery;  Laterality: N/A;  Times1 using endoscopically harvested saphenous vein graft to the PDA  . EYE SURGERY Left 1990's   "clipped muscle so it wouldn't be pulling up" (01/16/2013)  . INTRAOPERATIVE TRANSESOPHAGEAL ECHOCARDIOGRAM N/A 01/07/2013   Procedure: INTRAOPERATIVE TRANSESOPHAGEAL ECHOCARDIOGRAM;  Surgeon: Grace Isaac, MD;  Location: [place];  Service: Open Heart Surgery;  Laterality: N/A;  . LEFT AND RIGHT HEART CATHETERIZATION WITH CORONARY ANGIOGRAM N/A 11/20/2012   Procedure: LEFT AND RIGHT HEART CATHETERIZATION WITH CORONARY ANGIOGRAM;  Surgeon: Jolaine Artist, MD;  Location: [facility] CATH LAB;  Service: Cardiovascular;  Laterality: N/A;  . MAZE N/A 01/07/2013   Procedure: MAZE;  Surgeon: Grace Isaac, MD;  Location: [place];  Service: Open Heart Surgery;  Laterality: N/A;  . MULTIPLE EXTRACTIONS WITH ALVEOLOPLASTY N/A 12/11/2012   Procedure: Extraction of tooth #'s 4,5,8,0,99,83,38,25,05,39,76,73,41,93,79 wioth alveoloplasty and bialteral fibrous tuberosity reductions.;  Surgeon: Lenn Cal, DDS;  Location: [facility];  Service: Oral Surgery;  Laterality: N/A;  . NASAL SEPTUM SURGERY  2004  . TEE WITHOUT CARDIOVERSION N/A 11/21/2012   Procedure: TRANSESOPHAGEAL ECHOCARDIOGRAM (TEE);  Surgeon: Jolaine Artist, MD;  Location: [facility] ENDOSCOPY;  Service: Cardiovascular;  Laterality: N/A;  . VSD REPAIR N/A 01/07/2013   Procedure: VENTRICULAR SEPTAL DEFECT (VSD) REPAIR;  Surgeon: Grace Isaac, MD;  Location: [place];  Service: Open Heart Surgery;  Laterality: N/A;     Family History  Problem Relation Age of Onset  . Cancer Mother   . Diabetes Mother      Social History   Social History  . Marital status: Married    Spouse name: N/A  . Number of children: N/A  . 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 Smoking status: Never Smoker  . Smokeless tobacco: Never Used  . Alcohol use No  . Drug use: No  . Sexual activity: Not Currently   Other Topics Concern  . Not on file   Social History Narrative   Still works full time. Works around the yard. Lives with wife. Has 2 sisters, neither with cardiac issues.     Ht 6' (1.829 m)   Physical Exam:  Well appearing 71 yo man, NAD HEENT: Unremarkable Neck:  6 cm JVD, no thyromegally Lymphatics:  No adenopathy Back:  No CVA tenderness Lungs:  Clear with no wheezes HEART:  IRegular rate rhythm, no murmurs, no rubs, no clicks  Abd:  soft, positive bowel sounds, no organomegally, no rebound, no guarding Ext:  2 plus pulses, no edema, no cyanosis, no clubbing Skin:  No rashes no nodules Neuro:  CN II through XII intact, motor grossly intact  EKG - atrial flutter with a RVR   Assess/Plan: 1. Atrial flutter - review of his ECG after IV adenosine demonstrates atypical atrial flutter in a diseased atrium. He has predominantly 2:1 AV conduction on his ECG's. He is minimally symptomatic. My biggest concern is that he develops a tachy mediated CM. If his Ventricular rate is above 100, I will recommend the addition of another AV nodal blocking drug or consider initiation of amiodarone. If the rates are under a 100 then we would suggest watchful waiting. If he has long pauses, then we would consider a PPM.  2. CAD - he is s/p CABG and is doing welll. No anginal symptoms. 3. HTN - his blood pressure is up a bit. Will follow.  4. RBBB - his QRS is long as is his QT which would limit our ability to use anything but amiodarone.  Cristopher Peru, M.D.

## 2017-02-06 NOTE — Patient Instructions (Signed)
Medication Instructions:  Your physician recommends that you continue on your current medications as directed. Please refer to the Current Medication list given to you today.  Labwork: None ordered.  Testing/Procedures:  Please schedule a 24 hour holter monitor for February 09, 2017 if possible.  Your physician has recommended that you wear a holter monitor. Holter monitors are medical devices that record the heart's electrical activity. Doctors most often use these monitors to diagnose arrhythmias. Arrhythmias are problems with the speed or rhythm of the heartbeat. The monitor is a small, portable device. You can wear one while you do your normal daily activities. This is usually used to diagnose what is causing palpitations/syncope (passing out).   Follow-Up: Your physician wants you to follow-up after results of the holter monitor are received.  Any Other Special Instructions Will Be Listed Below (If Applicable).     If you need a refill on your cardiac medications before your next appointment, please call your pharmacy.

## 2017-02-07 ENCOUNTER — Other Ambulatory Visit: Payer: Managed Care, Other (non HMO)

## 2017-02-09 ENCOUNTER — Institutional Professional Consult (permissible substitution): Payer: Self-pay | Admitting: Internal Medicine

## 2017-02-09 ENCOUNTER — Ambulatory Visit (INDEPENDENT_AMBULATORY_CARE_PROVIDER_SITE_OTHER): Payer: Managed Care, Other (non HMO)

## 2017-02-09 DIAGNOSIS — R002 Palpitations: Secondary | ICD-10-CM | POA: Diagnosis not present

## 2017-02-09 DIAGNOSIS — I4892 Unspecified atrial flutter: Secondary | ICD-10-CM | POA: Diagnosis not present

## 2017-02-14 ENCOUNTER — Inpatient Hospital Stay: Admission: RE | Admit: 2017-02-14 | Payer: Self-pay | Source: Ambulatory Visit

## 2017-02-14 ENCOUNTER — Ambulatory Visit
Admission: RE | Admit: 2017-02-14 | Discharge: 2017-02-14 | Disposition: A | Discharge status: Home / Self Care | Payer: Managed Care, Other (non HMO) | Source: Ambulatory Visit | Attending: Internal Medicine | Admitting: Internal Medicine

## 2017-02-14 DIAGNOSIS — I4892 Unspecified atrial flutter: Secondary | ICD-10-CM | POA: Insufficient documentation

## 2017-02-14 DIAGNOSIS — R002 Palpitations: Secondary | ICD-10-CM | POA: Insufficient documentation

## 2017-02-27 ENCOUNTER — Telehealth: Payer: Self-pay | Admitting: Internal Medicine

## 2017-02-27 ENCOUNTER — Telehealth: Payer: Self-pay | Admitting: Cardiovascular Disease

## 2017-02-27 DIAGNOSIS — I712 Thoracic aortic aneurysm, without rupture, unspecified: Secondary | ICD-10-CM

## 2017-02-27 NOTE — Telephone Encounter (Signed)
Call returned to Pt.  Pt scheduled for f/u with Dr. Lovena Le 03/07/2017 @ 11:30 am.

## 2017-02-27 NOTE — Telephone Encounter (Signed)
New message   Pt wife states that someone left a message and told them that they would be getting a call from [place] this week and so far no one has called. I do not see any notes. Please call back.

## 2017-02-27 NOTE — Telephone Encounter (Signed)
sure

## 2017-02-27 NOTE — Telephone Encounter (Signed)
Spoke with patients wife per release form and she reports that he needed annual CT angiogram due to aortic root dilatation. Reviewed chart and saw that on echocardiogram on 02/01/17 it was normal but she would like to just check and see if he still needs annual CT imaging to monitor the size. Let her know that I would route message to provider for recommendations and give her a call back. She was very appreciative for the call and has no further questions at this time.

## 2017-02-27 NOTE — Telephone Encounter (Signed)
Spoke with patients wife per release form and CT Angiogram ordered for aorta dilatation. Provided her with number to call for scheduling and also sent this over to precert team. She was appreciative for the call back with no further questions.

## 2017-02-27 NOTE — Telephone Encounter (Signed)
Pt wife calling stating they need to schedule the CT scan that they did last year They were advised by Dr Yvone Neu pt needs to have one yearly.   Please advise.

## 2017-02-28 ENCOUNTER — Telehealth: Payer: Self-pay | Admitting: *Deleted

## 2017-02-28 ENCOUNTER — Other Ambulatory Visit: Payer: Self-pay

## 2017-02-28 MED ORDER — APIXABAN 5 MG PO TABS: 5.0000 mg | ORAL_TABLET | Freq: Two times a day (BID) | ORAL | 3 refills | Status: DC

## 2017-02-28 MED ORDER — ATORVASTATIN CALCIUM 20 MG PO TABS: 20.0000 mg | ORAL_TABLET | Freq: Every day | ORAL | 3 refills | Status: DC

## 2017-02-28 NOTE — Telephone Encounter (Signed)
Informed patients wife that authorization has been obtained and she can call to get it scheduled. She was appreciative for the call and has no further questions.

## 2017-02-28 NOTE — Telephone Encounter (Signed)
Refill sent for Eliquis 5 mg & Atorvastatin 20 mg.

## 2017-02-28 NOTE — Telephone Encounter (Signed)
-----   Message from [facility] sent at 02/28/2017  8:28 AM EDT ----- Regarding: RE: Precert Ok to schedule  [employer]#[id number] exp 05-29-17 ----- Message ----- From: Valora Corporal, RN Sent: 02/27/2017   4:28 PM To: Rebeca Alert [address] Pre Cert/Auth Subject: Precert                                        Precert CT Angiogram Aortic dilatation [facility] Ordered by Ignacia Bayley NP  THanks, Lesleigh Noe

## 2017-03-07 ENCOUNTER — Ambulatory Visit (INDEPENDENT_AMBULATORY_CARE_PROVIDER_SITE_OTHER): Payer: Managed Care, Other (non HMO) | Admitting: Internal Medicine

## 2017-03-07 ENCOUNTER — Encounter: Payer: Self-pay | Admitting: Internal Medicine

## 2017-03-07 ENCOUNTER — Telehealth: Payer: Self-pay | Admitting: *Deleted

## 2017-03-07 VITALS — BP 136/80 | HR 105 | Ht 72.0 in | Wt 212.8 lb

## 2017-03-07 DIAGNOSIS — I10 Essential (primary) hypertension: Secondary | ICD-10-CM

## 2017-03-07 DIAGNOSIS — I2510 Atherosclerotic heart disease of native coronary artery without angina pectoris: Secondary | ICD-10-CM

## 2017-03-07 DIAGNOSIS — I4892 Unspecified atrial flutter: Secondary | ICD-10-CM | POA: Diagnosis not present

## 2017-03-07 MED ORDER — METOPROLOL TARTRATE 100 MG PO TABS: 100.0000 mg | ORAL_TABLET | Freq: Two times a day (BID) | ORAL | 3 refills | Status: DC

## 2017-03-07 NOTE — Progress Notes (Signed)
HPI Aaron Jones returns today for ongoing evaluation and management of his atrial arrhythmias. He is a 71 year old man with a remote history of VSD repair and bypass surgery complicated by postoperative atrial fibrillation and flutter. He has not maintaining sinus rhythm. He has been systemically anticoagulated with an oral anticoagulants. He has been treated with a strategy of rate control. He were cardiac monitor several weeks ago, which demonstrated an average heart rate of 108 bpm. The minimum heart rate was 96 bpm. The maximum heart rate was 132 bpm. The patient does not have palpitations. He has class II heart failure symptoms. He has not had syncope. Allergies  Allergen Reactions  . Biaxin [Clarithromycin] Nausea Only     Current Outpatient Prescriptions  Medication Sig Dispense Refill  . apixaban (ELIQUIS) 5 MG TABS tablet Take 1 tablet (5 mg total) by mouth 2 (two) times daily. 180 tablet 3  . atorvastatin (LIPITOR) 20 MG tablet Take 1 tablet (20 mg total) by mouth at bedtime. 90 tablet 3  . furosemide (LASIX) 40 MG tablet TAKE 1 TABLET BY MOUTH EVERY DAY 30 tablet 1  . glipiZIDE (GLUCOTROL) 10 MG tablet Take 10 mg by mouth daily.    . Glucose Blood (BLOOD GLUCOSE TEST STRIPS) STRP 1 strip by In Vitro route 2 (two) times daily. 100 each 0  . metFORMIN (GLUCOPHAGE) 1000 MG tablet Take 1,000 mg by mouth 2 (two) times daily.  1  . metoprolol (LOPRESSOR) 100 MG tablet Take 1 tablet (100 mg total) by mouth 2 (two) times daily. 180 tablet 3  . metoprolol tartrate (LOPRESSOR) 25 MG tablet Take 2 tablets (50 mg total) by mouth 2 (two) times daily. Total dosage should be Metoprolol Tartrate 150 mg twice a day 360 tablet 0  . omeprazole (PRILOSEC) 40 MG capsule Take 40 mg by mouth daily before breakfast.      No current facility-administered medications for this visit.      Past Medical History:  Diagnosis Date  . Atrial fibrillation with rapid ventricular response ([place]) 01/16/2013     a. 01/2013 s/p R & L Maze and LAA clipping in setting of VSD repair;  b. Prev on amiodarone;  c. CHA2DS2VASc = 4-->Apixaban;  c. 01/2013 Recurrent afib/flutter requiring DCCV.  Marland Kitchen COPD (chronic obstructive pulmonary disease) ([place])   . Coronary artery disease    a. 01/2013 s/p CABG x 1 (VG->PDA) in setting of VSD repair.  Marland Kitchen GERD (gastroesophageal reflux disease)   . Hyperlipidemia   . Hypertension    PCP- Kathlynn Grate, phone; [phone]  . PAT (paroxysmal atrial tachycardia) ([place])    a. 02/2015 s/p DCCV;  b. 12/2016 recurrent PAT - unchanged with escalating BB dose (rates 106-107).  Marland Kitchen PSVT (paroxysmal supraventricular tachycardia) ([place])    a. 10/2011 Admitted to [facility] w/ SVT-->broke with IV dilt.  . Recurrent Nephrolithiasis   . Sleep apnea    [facility]- in process of being evaluated now, states 12 yrs. ago was on CPAP but after having his deviated septum repaired, he had put the CPAP in storage. Pt. will get a new machine soon.   . Thoracic aortic aneurysm ([place])    a. 01/2016 CTA chest: 4.0 cm Asc Ao; b. 01/2017 Echo: nl Ao root size.  . Type II diabetes mellitus ([place])   . Umbilical hernia    "not repaired" (01/16/2013)  . VSD (ventricular septal defect) w/ persistent systolic murmur    a. 09/4008 s/p VSD closure (dacron patch);  b. 01/2017 Echo: EF 50-55%, no rwma, mild AS, mild MR, mod dil LA, dilated RV, sev dil RA, mild TR. PASP nl.    ROS:   All systems reviewed and negative except as noted in the HPI.   Past Surgical History:  Procedure Laterality Date  . CARDIAC CATHETERIZATION  > 5 yr ago   Done at [facility], reportedly clean  . CARDIAC CATHETERIZATION  11/2012  . CARDIOVERSION N/A 02/12/2015   Procedure: CARDIOVERSION;  Surgeon: Thayer Headings, MD;  Location: [facility] ENDOSCOPY;  Service: Cardiovascular;  Laterality: N/A;  . CLIPPING OF ATRIAL APPENDAGE N/A 01/07/2013   Procedure: CLIPPING OF ATRIAL APPENDAGE;  Surgeon: Grace Isaac, MD;  Location: [place];  Service: Open Heart  Surgery;  Laterality: N/A;  . CORONARY ARTERY BYPASS GRAFT N/A 01/07/2013   Procedure: CORONARY ARTERY BYPASS GRAFTING (CABG);  Surgeon: Grace Isaac, MD;  Location: [place];  Service: Open Heart Surgery;  Laterality: N/A;  Times1 using endoscopically harvested saphenous vein graft to the PDA  . EYE SURGERY Left 1990's   "clipped muscle so it wouldn't be pulling up" (01/16/2013)  . INTRAOPERATIVE TRANSESOPHAGEAL ECHOCARDIOGRAM N/A 01/07/2013   Procedure: INTRAOPERATIVE TRANSESOPHAGEAL ECHOCARDIOGRAM;  Surgeon: Grace Isaac, MD;  Location: [place];  Service: Open Heart Surgery;  Laterality: N/A;  . LEFT AND RIGHT HEART CATHETERIZATION WITH CORONARY ANGIOGRAM N/A 11/20/2012   Procedure: LEFT AND RIGHT HEART CATHETERIZATION WITH CORONARY ANGIOGRAM;  Surgeon: Jolaine Artist, MD;  Location: [facility] CATH LAB;  Service: Cardiovascular;  Laterality: N/A;  . MAZE N/A 01/07/2013   Procedure: MAZE;  Surgeon: Grace Isaac, MD;  Location: [place];  Service: Open Heart Surgery;  Laterality: N/A;  . MULTIPLE EXTRACTIONS WITH ALVEOLOPLASTY N/A 12/11/2012   Procedure: Extraction of tooth #'s 1,7,5,1,02,58,52,77,82,42,35,36,14,43,15 wioth alveoloplasty and bialteral fibrous tuberosity reductions.;  Surgeon: Lenn Cal, DDS;  Location: [facility];  Service: Oral Surgery;  Laterality: N/A;  . NASAL SEPTUM SURGERY  2004  . TEE WITHOUT CARDIOVERSION N/A 11/21/2012   Procedure: TRANSESOPHAGEAL ECHOCARDIOGRAM (TEE);  Surgeon: Jolaine Artist, MD;  Location: [facility] ENDOSCOPY;  Service: Cardiovascular;  Laterality: N/A;  . VSD REPAIR N/A 01/07/2013   Procedure: VENTRICULAR SEPTAL DEFECT (VSD) REPAIR;  Surgeon: Grace Isaac, MD;  Location: [place];  Service: Open Heart Surgery;  Laterality: N/A;     Family History  Problem Relation Age of Onset  . Cancer Mother   . Diabetes Mother      Social History   Social History  . Marital status: Married    Spouse name: N/A  . Number of children: N/A  . 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 Smoking status: Never Smoker  . Smokeless tobacco: Never Used  . Alcohol use No  . Drug use: No  . Sexual activity: Not Currently   Other Topics Concern  . Not on file   Social History Narrative   Still works full time. Works around the yard. Lives with wife. Has 2 sisters, neither with cardiac issues.     BP 136/80   Pulse (!) 105   Ht 6' (1.829 m)   Wt 212 lb 12.8 oz (96.5 kg)   SpO2 98%   BMI 28.86 kg/m   Physical Exam:  Well appearing NAD HEENT: Unremarkable Neck:  No JVD, no thyromegally Lymphatics:  No adenopathy Back:  No CVA tenderness Lungs:  Clear HEART:  Regular rate rhythm, no murmurs, no rubs, no clicks Abd:  soft, positive bowel sounds, no organomegally, no rebound, no guarding Ext:  2 plus pulses, no edema, no cyanosis, no clubbing Skin:  No rashes no nodules Neuro:  CN II through XII intact, motor grossly intact   Assess/Plan: 1. Atrial fibrillation and flutter - his ventricular rate does not appear to be particularly well controlled. On the other hand he does not experience palpitations and had no real fast heartbeats in atrial fib/flutter during the last monitor. We have discussed the various treatment options. Because of his underlying coronary disease, he only medication strategies for rhythm control would be amiodarone versus sotalol versus dofetilide. The advantages and disadvantages of each of these medications was discussed in detail. Rate control was also discussed. To better understand his ventricular rate, I've asked him to undergo exercise testing so that we can see whether or not his heart rate increases significantly, whether he develops any ventricular arrhythmias, and whether his blood pressure remains stable during exercise. Additional recommendations will follow based on the results of his stress test. I do not think the  patient's atrial flutter is in the right atrium rather it looks to me like left atrial flutter. 2. Coronary artery disease - he is status post bypass surgery at the time of his ventricular septal defect repair. He has no anginal symptoms. 3. Chronic diastolic heart failure - his symptoms are class II. The real question is whether his heart failure symptoms would improve with rhythm control. He is encouraged to maintain a low-sodium diet at this time. 4. PVCs - he had 20,000 PVCs on his 24-hour monitor although I suspect some of these were overcounted because of his underlying atrial arrhythmias and irregular ventricular rate. Of course antiarrhythmic drug therapy would likely improve his PVC burden as well as treating his atrial fibrillation and flutter.

## 2017-03-07 NOTE — Telephone Encounter (Signed)
Clearance form incomplete. Patient being seen in clinic today by Dr. Lovena Le, form given to [facility]

## 2017-03-07 NOTE — Patient Instructions (Addendum)
Medication Instructions:  Your physician recommends that you continue on your current medications as directed. Please refer to the Current Medication list given to you today.  Labwork: None ordered.  Testing/Procedures: Your physician has requested that you have an exercise tolerance test. For further information please visit [email or web address]. Please also follow instruction sheet, as given.  Follow-Up: Your physician wants you to follow-up with Dr. Lovena Le based on results of your stress test.  Any Other Special Instructions Will Be Listed Below (If Applicable).   Cardiopulmonary Exercise Stress Test Cardiopulmonary exercise testing (CPET) is a test that checks how your heart and lungs react to exercise. This is called your exercise capacity. During this test, you will walk or run on a treadmill or pedal on a stationary bike while tests are done on your heart and lungs. You may have this test to:  See why you are short of breath.  Check for exercise intolerance.  See how your lungs work.  See how your heart works.  Check for how you are responding to a heart or lung rehabilitation program.  See if you have a heart or lung problem.  See if you are healthy enough to have surgery.  What happens before the procedure?  Follow instructions from your doctor about what you cannot eat or drink.  Ask your doctor about changing or stopping your normal medicines. This is important if you take diabetes medicines or blood thinners.  Wear loose, comfortable clothing and shoes.  If you use an inhaler, bring it with you to the test. What happens during the procedure?  A blood pressure cuff will be placed on your arm.  Several stick-on patches (electrodes) will be placed on your chest. They will be attached to an electrocardiogram (EKG) machine.  A clip-on monitor that measures the amount of oxygen in your blood will be placed on your finger (pulse oximeter).  A clip will be placed  on your nose and a mouthpiece will be placed in your mouth. This may be held in place with a headpiece. You will breathe through the mouthpiece during the test.  You will be asked to start exercising. You will be closely watched while you exercise.  The amount of effort for your exercise will be gradually increased.  During exercise, the test will measure: ? Your heart rate. ? Your heart rhythm. ? Your oxygen blood level. ? The amount of oxygen and carbon dioxide that you breathe out.  The test will end when: ? You have finished the test. ? You have reached your maximum ability to exercise. ? You have chest or leg pain, dizziness, or shortness of breath. The procedure may vary among doctors and hospitals. What happens after the procedure?  Your blood pressure and EKG will be checked to watch how you recover from the test. This information is not intended to replace advice given to you by your health care provider. Make sure you discuss any questions you have with your health care provider. Document Released: 05/10/2009 Document Revised: 10/12/2015 Document Reviewed: 04/05/2015 Elsevier Interactive Patient Education  [employer].    If you need a refill on your cardiac medications before your next appointment, please call your pharmacy.

## 2017-03-08 ENCOUNTER — Telehealth: Payer: Self-pay | Admitting: *Deleted

## 2017-03-08 NOTE — Telephone Encounter (Signed)
Patient not home at this time. S/w wife, ok per DPR. Friendly reminder regarding GXT tomorrow. While reviewing medications, wife states patient was told to continue metoprolol and not to hold evening dose tonight and morning dose tomorrow prior to test. Advised I will route to nurse and Dr. Lovena Le to verify instructions regarding metoprolol prior to stress test as we usually have patient hold the night before and morning of.

## 2017-03-08 NOTE — Telephone Encounter (Signed)
Call placed to wife.  Notified wife that she was correct.  Pt should NOT hold metoprolol prior to test and should take all normal morning meds prior to test.  Wife indicates understanding.

## 2017-03-09 ENCOUNTER — Other Ambulatory Visit: Payer: Self-pay | Admitting: *Deleted

## 2017-03-09 ENCOUNTER — Ambulatory Visit
Admission: RE | Admit: 2017-03-09 | Discharge: 2017-03-09 | Disposition: A | Discharge status: Home / Self Care | Payer: Managed Care, Other (non HMO) | Source: Ambulatory Visit | Attending: Nurse Practitioner | Admitting: Nurse Practitioner

## 2017-03-09 ENCOUNTER — Ambulatory Visit (INDEPENDENT_AMBULATORY_CARE_PROVIDER_SITE_OTHER): Payer: Managed Care, Other (non HMO)

## 2017-03-09 DIAGNOSIS — K449 Diaphragmatic hernia without obstruction or gangrene: Secondary | ICD-10-CM | POA: Insufficient documentation

## 2017-03-09 DIAGNOSIS — I700 Atherosclerosis of aorta: Secondary | ICD-10-CM | POA: Insufficient documentation

## 2017-03-09 DIAGNOSIS — I2510 Atherosclerotic heart disease of native coronary artery without angina pectoris: Secondary | ICD-10-CM | POA: Diagnosis not present

## 2017-03-09 DIAGNOSIS — Z951 Presence of aortocoronary bypass graft: Secondary | ICD-10-CM | POA: Diagnosis not present

## 2017-03-09 DIAGNOSIS — I281 Aneurysm of pulmonary artery: Secondary | ICD-10-CM | POA: Insufficient documentation

## 2017-03-09 DIAGNOSIS — I517 Cardiomegaly: Secondary | ICD-10-CM | POA: Diagnosis not present

## 2017-03-09 DIAGNOSIS — I4892 Unspecified atrial flutter: Secondary | ICD-10-CM | POA: Diagnosis not present

## 2017-03-09 DIAGNOSIS — I712 Thoracic aortic aneurysm, without rupture, unspecified: Secondary | ICD-10-CM

## 2017-03-09 LAB — EXERCISE TOLERANCE TEST
CSEPEDS: 35 s
CSEPEW: 6.5 METS
Exercise duration (min): 4 min
MPHR: 151 {beats}/min
Peak HR: 106 {beats}/min
Percent HR: 70 %
Rest HR: 104 {beats}/min

## 2017-03-09 LAB — POCT I-STAT CREATININE: CREATININE: 1 mg/dL (ref 0.61–1.24)

## 2017-03-09 MED ORDER — IOPAMIDOL (ISOVUE-370) INJECTION 76%
100.0000 mL | Freq: Once | INTRAVENOUS | Status: AC | PRN
  Administered 2017-03-09: 100 mL via INTRAVENOUS

## 2017-03-13 ENCOUNTER — Telehealth: Payer: Self-pay

## 2017-03-13 NOTE — Telephone Encounter (Signed)
Patient with diagnosis of atrial fibrillation on Eliquis for anticoagulation.    Procedure: colonoscopy Date of procedure: 05/02/17  CHADS2-VASc score of  5 (CHF, HTN, AGE, DM2, CAD)  CrCl 95.2 Platelet count 250  Per office protocol, patient can hold Eliquis for 2 days prior to procedure.    Patient should restart Eliquis on the evening of procedure or day after, at discretion of procedure MD

## 2017-03-13 NOTE — Telephone Encounter (Signed)
   [facility] Pre-operative Risk Assessment    Request for surgical clearance:  1. What type of surgery is being performed? colonoscopy  2. When is this surgery scheduled? 05/02/2017  3. Are there any medications that need to be held prior to surgery and how long? Practice has their own guidelines for different anticoagulants. If patient needs to hold longer outside of practice protocols which in this case would be 3 days for eliquis. Please advise.   4. Practice name and name of physician performing surgery? [facility] clinic gastroenterology dept. Tammi Klippel PA-C   5. What is your office phone and fax number? P: [phone]. F: [phone].  6. Anesthesia type (None, local, MAC, general) ? [place], [place] 03/13/2017, 12:41 PM  _________________________________________________________________   (provider comments below)

## 2017-03-13 NOTE — Telephone Encounter (Signed)
Will forward to APP for questions regarding anesthesia

## 2017-03-19 ENCOUNTER — Telehealth: Payer: Self-pay | Admitting: Internal Medicine

## 2017-03-19 MED ORDER — AMIODARONE HCL 200 MG PO TABS: 400.0000 mg | ORAL_TABLET | Freq: Every day | ORAL | 3 refills | Status: DC

## 2017-03-19 NOTE — Telephone Encounter (Signed)
Aaron Jones is calling to find the results from his ETT and he also had some issues over the weekend where he was not able to get his breath and thought his heart stop and blood pressure was 160/110. Did not go to the ER. Please call

## 2017-03-19 NOTE — Telephone Encounter (Signed)
Call returned to Pt wife.  Results of stress test given. Based on his stress test results and his ECG, my suggestion would be to start amiodarone 400 mg daily. I would like to see him back in 4-6 weeks. I would anticipated DCCV once he has been on amiodarone. He appears to need NSR rather than rate controlled atypical atrial flutter. GT  Notified wife of results.  Confirmed Pt pharmacy.  Notified wife that office would call her with follow up appointment.  All questions answered.  Sending prescription to [facility] and will send appt request.

## 2017-03-19 NOTE — Telephone Encounter (Signed)
    Chart reviewed as part of pre-operative protocol coverage. Aaron Jones was last seen on 03/07/17  by Dr. Lovena Le. Nondiagnostic treadmill stress test due to inability to increase heart rate. Started on Amiodarone and plan for DCCV in 4-6 weeks, however cleared by pharmacy to hold Eliquis.  Please advice.   [place], PA 03/19/2017, 4:17 PM

## 2017-03-28 NOTE — Telephone Encounter (Signed)
Per Dr. Corliss Parish for patient to proceed with colonoscopy.

## 2017-03-29 ENCOUNTER — Telehealth: Payer: Self-pay

## 2017-03-29 NOTE — Telephone Encounter (Signed)
   [facility] Pre-operative Risk Assessment    Request for surgical clearance:  1. What type of surgery is being performed? Colonoscopy   2. When is this surgery scheduled? 05/02/17   3. Are there any medications that need to be held prior to surgery and how long? Eliquis for 3 days   4. Practice name and name of physician performing surgery? [facility] Gastroenterology Dept. Tammi Klippel PA-C   5. What is your office phone and fax number? Phone [phone] Fax [phone]   6. Anesthesia type (None, local, MAC, general) ? Monitored anesthesia   [place] 03/29/2017, 2:11 PM  _________________________________________________________________   (provider comments below)

## 2017-04-02 NOTE — Telephone Encounter (Signed)
This was already addressed.  See phone note from 03/13/17. Richardson Dopp, PA-C    04/02/2017 4:40 PM

## 2017-04-02 NOTE — Telephone Encounter (Signed)
   Chart reviewed as part of pre-operative protocol coverage.   Chart has been reviewed by our clinical pharmacist as well as the patient's primary cardiologist, Dr. Cristopher Peru.  1.  The patient may hold Eliquis for 2 days prior to procedure and resume the evening of or the day after procedure (per discretion of surgeon).  2.  After further review by the patient's primary cardiologist, Dr. Lovena Le, patient is felt to be at acceptable risk for colonoscopy and may proceed.  Richardson Dopp, PA-C 04/02/2017, 3:33 PM

## 2017-04-25 ENCOUNTER — Ambulatory Visit: Payer: Managed Care, Other (non HMO) | Admitting: Internal Medicine

## 2017-04-25 ENCOUNTER — Encounter: Payer: Self-pay | Admitting: Internal Medicine

## 2017-04-25 VITALS — BP 134/72 | HR 86 | Ht 72.0 in | Wt 211.0 lb

## 2017-04-25 DIAGNOSIS — I4892 Unspecified atrial flutter: Secondary | ICD-10-CM | POA: Diagnosis not present

## 2017-04-25 NOTE — H&P (View-Only) (Signed)
HPI Aaron Jones returns today for ongoing evaluation and management of atrial fibrillation and flutter. I saw him back in 6 weeks ago and he was in A. fib/flutter and his ventricular rate was not well controlled. He has been on 400 mg daily of amiodarone since then. He has not missed his oral anticoagulants. His atrial rate has gone down. He has what appears to be 2:1 atrial flutter presently. At rest he feels well but with any significant exertion, he experiences shortness of breath. He has not had syncope. He has no peripheral edema. He denies anginal symptoms. Allergies  Allergen Reactions  . Biaxin [Clarithromycin] Nausea Only     Current Outpatient Medications  Medication Sig Dispense Refill  . amiodarone (PACERONE) 200 MG tablet Take 2 tablets (400 mg total) by mouth daily. 90 tablet 3  . apixaban (ELIQUIS) 5 MG TABS tablet Take 1 tablet (5 mg total) by mouth 2 (two) times daily. 180 tablet 3  . atorvastatin (LIPITOR) 20 MG tablet Take 1 tablet (20 mg total) by mouth at bedtime. 90 tablet 3  . furosemide (LASIX) 40 MG tablet TAKE 1 TABLET BY MOUTH EVERY DAY 30 tablet 1  . glipiZIDE (GLUCOTROL) 10 MG tablet Take 10 mg by mouth daily.    . Glucose Blood (BLOOD GLUCOSE TEST STRIPS) STRP 1 strip by In Vitro route 2 (two) times daily. 100 each 0  . metFORMIN (GLUCOPHAGE) 1000 MG tablet Take 1,000 mg by mouth 2 (two) times daily.  1  . metoprolol tartrate (LOPRESSOR) 100 MG tablet Take 1 tablet (100 mg total) by mouth 2 (two) times daily. 180 tablet 3  . metoprolol tartrate (LOPRESSOR) 25 MG tablet Take 2 tablets (50 mg total) by mouth 2 (two) times daily. Total dosage should be Metoprolol Tartrate 150 mg twice a day 360 tablet 0  . omeprazole (PRILOSEC) 40 MG capsule Take 40 mg by mouth daily before breakfast.      No current facility-administered medications for this visit.      Past Medical History:  Diagnosis Date  . Atrial fibrillation with rapid ventricular response ([place])  01/16/2013   a. 01/2013 s/p R & L Maze and LAA clipping in setting of VSD repair;  b. Prev on amiodarone;  c. CHA2DS2VASc = 4-->Apixaban;  c. 01/2013 Recurrent afib/flutter requiring DCCV.  Marland Kitchen COPD (chronic obstructive pulmonary disease) ([place])   . Coronary artery disease    a. 01/2013 s/p CABG x 1 (VG->PDA) in setting of VSD repair.  Marland Kitchen GERD (gastroesophageal reflux disease)   . Hyperlipidemia   . Hypertension    PCP- Kathlynn Grate, phone; [phone]  . PAT (paroxysmal atrial tachycardia) ([place])    a. 02/2015 s/p DCCV;  b. 12/2016 recurrent PAT - unchanged with escalating BB dose (rates 106-107).  Marland Kitchen PSVT (paroxysmal supraventricular tachycardia) ([place])    a. 10/2011 Admitted to [facility] w/ SVT-->broke with IV dilt.  . Recurrent Nephrolithiasis   . Sleep apnea    [facility]- in process of being evaluated now, states 12 yrs. ago was on CPAP but after having his deviated septum repaired, he had put the CPAP in storage. Pt. will get a new machine soon.   . Thoracic aortic aneurysm ([place])    a. 01/2016 CTA chest: 4.0 cm Asc Ao; b. 01/2017 Echo: nl Ao root size.  . Type II diabetes mellitus ([place])   . Umbilical hernia    "not repaired" (01/16/2013)  . VSD (ventricular septal defect) w/ persistent systolic murmur  a. 01/2013 s/p VSD closure (dacron patch);  b. 01/2017 Echo: EF 50-55%, no rwma, mild AS, mild MR, mod dil LA, dilated RV, sev dil RA, mild TR. PASP nl.    ROS:   All systems reviewed and negative except as noted in the HPI.   Past Surgical History:  Procedure Laterality Date  . CARDIAC CATHETERIZATION  > 5 yr ago   Done at [facility], reportedly clean  . CARDIAC CATHETERIZATION  11/2012  . CARDIOVERSION N/A 02/12/2015   Procedure: CARDIOVERSION;  Surgeon: Thayer Headings, MD;  Location: [facility] ENDOSCOPY;  Service: Cardiovascular;  Laterality: N/A;  . CLIPPING OF ATRIAL APPENDAGE N/A 01/07/2013   Procedure: CLIPPING OF ATRIAL APPENDAGE;  Surgeon: Grace Isaac, MD;  Location: [place];  Service: Open  Heart Surgery;  Laterality: N/A;  . CORONARY ARTERY BYPASS GRAFT N/A 01/07/2013   Procedure: CORONARY ARTERY BYPASS GRAFTING (CABG);  Surgeon: Grace Isaac, MD;  Location: [place];  Service: Open Heart Surgery;  Laterality: N/A;  Times1 using endoscopically harvested saphenous vein graft to the PDA  . EYE SURGERY Left 1990's   "clipped muscle so it wouldn't be pulling up" (01/16/2013)  . INTRAOPERATIVE TRANSESOPHAGEAL ECHOCARDIOGRAM N/A 01/07/2013   Procedure: INTRAOPERATIVE TRANSESOPHAGEAL ECHOCARDIOGRAM;  Surgeon: Grace Isaac, MD;  Location: [place];  Service: Open Heart Surgery;  Laterality: N/A;  . LEFT AND RIGHT HEART CATHETERIZATION WITH CORONARY ANGIOGRAM N/A 11/20/2012   Procedure: LEFT AND RIGHT HEART CATHETERIZATION WITH CORONARY ANGIOGRAM;  Surgeon: Jolaine Artist, MD;  Location: [facility] CATH LAB;  Service: Cardiovascular;  Laterality: N/A;  . MAZE N/A 01/07/2013   Procedure: MAZE;  Surgeon: Grace Isaac, MD;  Location: [place];  Service: Open Heart Surgery;  Laterality: N/A;  . MULTIPLE EXTRACTIONS WITH ALVEOLOPLASTY N/A 12/11/2012   Procedure: Extraction of tooth #'s 3,3,2,9,51,88,41,66,06,30,16,01,09,32,35 wioth alveoloplasty and bialteral fibrous tuberosity reductions.;  Surgeon: Lenn Cal, DDS;  Location: [facility];  Service: Oral Surgery;  Laterality: N/A;  . NASAL SEPTUM SURGERY  2004  . TEE WITHOUT CARDIOVERSION N/A 11/21/2012   Procedure: TRANSESOPHAGEAL ECHOCARDIOGRAM (TEE);  Surgeon: Jolaine Artist, MD;  Location: [facility] ENDOSCOPY;  Service: Cardiovascular;  Laterality: N/A;  . VSD REPAIR N/A 01/07/2013   Procedure: VENTRICULAR SEPTAL DEFECT (VSD) REPAIR;  Surgeon: Grace Isaac, MD;  Location: [place];  Service: Open Heart Surgery;  Laterality: N/A;     Family History  Problem Relation Age of Onset  . Cancer Mother   . Diabetes Mother      Social History   Socioeconomic History  . Marital status: Married    Spouse name: Not on file  . Number of children: Not  on file  . Years of education: Not on file  . Highest education level: Not on file  Social Needs  . Financial resource strain: Not on file  . Food insecurity - worry: Not on file  . Food insecurity - inability: Not on file  . Transportation needs - medical: Not on file  . Transportation needs - non-medical: Not on file  Occupational History  . Occupation: Psychologist, educational    Comment: Heavy work at times  Tobacco Use  . Smoking status: Never Smoker  . Smokeless tobacco: Never Used  Substance and Sexual Activity  . Alcohol use: No  . Drug use: No  . Sexual activity: Not Currently  Other Topics Concern  . Not on file  Social History Narrative   Still works full time. Works around the yard. Lives with wife.  Has 2 sisters, neither with cardiac issues.     BP 134/72   Pulse 86   Ht 6' (1.829 m)   Wt 211 lb (95.7 kg)   SpO2 98%   BMI 28.62 kg/m   Physical Exam:  Well appearing NAD HEENT: Unremarkable Neck:  6 cm JVD, no thyromegally Lymphatics:  No adenopathy Back:  No CVA tenderness Lungs:  Clear with no wheezes, rales, or rhonchi. HEART:  Regular rate rhythm, no murmurs, no rubs, no clicks Abd:  soft, positive bowel sounds, no organomegally, no rebound, no guarding Ext:  2 plus pulses, no edema, no cyanosis, no clubbing Skin:  No rashes no nodules Neuro:  CN II through XII intact, motor grossly intact  EKG - probable atrial flutter with 2:1 AV conduction and right bundle branch block, left axis  Assess/Plan: 1. Atrial fib/flutter - The patient has taken his amiodarone and his oral anticoagulants. He will undergo DC cardioversion. We will schedule that in the next few days. 2. Coronary artery disease - he is status post revascularization and denies anginal symptoms. 3. Congestive heart failure - he has diastolic dysfunction and has class II symptoms. Once he is back in sinus rhythm, hopefully he will have improvement in his symptoms. 4. Hypertension - his blood pressure  has been reasonably well controlled. He will maintain a low-sodium diet.  Cristopher Peru, M.D.

## 2017-04-25 NOTE — Patient Instructions (Addendum)
Medication Instructions:  Your physician recommends that you continue on your current medications as directed. Please refer to the Current Medication list given to you today.  Labwork: You will get blood work today for your cardioversion on Friday.  Testing/Procedures: Your physician has recommended that you have a Cardioversion (DCCV). Electrical Cardioversion uses a jolt of electricity to your heart either through paddles or wired patches attached to your chest. This is a controlled, usually prescheduled, procedure. Defibrillation is done under light anesthesia in the hospital, and you usually go home the day of the procedure. This is done to get your heart back into a normal rhythm. You are not awake for the procedure. Please see the instruction sheet given to you today.   Follow-Up: You will follow up with Dr. Lovena Le 4 weeks after your procedure.  Any Other Special Instructions Will Be Listed Below (If Applicable).  Please arrive at the [facility] main entrance of [facility] hospital at:  5:30 am on 04/27/2017. Do not eat or drink after midnight prior to procedure DO NOT miss any doses of your Eliquis!!!  Take the morning of your procedure. Take ALL your morning medicine except your lasix, metformin, and glipizide. You will be discharged home.  You will need someone to drive you home at discharge.      If you need a refill on your cardiac medications before your next appointment, please call your pharmacy.

## 2017-04-25 NOTE — Progress Notes (Signed)
HPI Aaron Jones returns today for ongoing evaluation and management of atrial fibrillation and flutter. I saw him back in 6 weeks ago and he was in A. fib/flutter and his ventricular rate was not well controlled. He has been on 400 mg daily of amiodarone since then. He has not missed his oral anticoagulants. His atrial rate has gone down. He has what appears to be 2:1 atrial flutter presently. At rest he feels well but with any significant exertion, he experiences shortness of breath. He has not had syncope. He has no peripheral edema. He denies anginal symptoms. Allergies  Allergen Reactions  . Biaxin [Clarithromycin] Nausea Only     Current Outpatient Medications  Medication Sig Dispense Refill  . amiodarone (PACERONE) 200 MG tablet Take 2 tablets (400 mg total) by mouth daily. 90 tablet 3  . apixaban (ELIQUIS) 5 MG TABS tablet Take 1 tablet (5 mg total) by mouth 2 (two) times daily. 180 tablet 3  . atorvastatin (LIPITOR) 20 MG tablet Take 1 tablet (20 mg total) by mouth at bedtime. 90 tablet 3  . furosemide (LASIX) 40 MG tablet TAKE 1 TABLET BY MOUTH EVERY DAY 30 tablet 1  . glipiZIDE (GLUCOTROL) 10 MG tablet Take 10 mg by mouth daily.    . Glucose Blood (BLOOD GLUCOSE TEST STRIPS) STRP 1 strip by In Vitro route 2 (two) times daily. 100 each 0  . metFORMIN (GLUCOPHAGE) 1000 MG tablet Take 1,000 mg by mouth 2 (two) times daily.  1  . metoprolol tartrate (LOPRESSOR) 100 MG tablet Take 1 tablet (100 mg total) by mouth 2 (two) times daily. 180 tablet 3  . metoprolol tartrate (LOPRESSOR) 25 MG tablet Take 2 tablets (50 mg total) by mouth 2 (two) times daily. Total dosage should be Metoprolol Tartrate 150 mg twice a day 360 tablet 0  . omeprazole (PRILOSEC) 40 MG capsule Take 40 mg by mouth daily before breakfast.      No current facility-administered medications for this visit.      Past Medical History:  Diagnosis Date  . Atrial fibrillation with rapid ventricular response ([place])  01/16/2013   a. 01/2013 s/p R & L Maze and LAA clipping in setting of VSD repair;  b. Prev on amiodarone;  c. CHA2DS2VASc = 4-->Apixaban;  c. 01/2013 Recurrent afib/flutter requiring DCCV.  Marland Kitchen COPD (chronic obstructive pulmonary disease) ([place])   . Coronary artery disease    a. 01/2013 s/p CABG x 1 (VG->PDA) in setting of VSD repair.  Marland Kitchen GERD (gastroesophageal reflux disease)   . Hyperlipidemia   . Hypertension    PCP- Kathlynn Grate, phone; [phone]  . PAT (paroxysmal atrial tachycardia) ([place])    a. 02/2015 s/p DCCV;  b. 12/2016 recurrent PAT - unchanged with escalating BB dose (rates 106-107).  Marland Kitchen PSVT (paroxysmal supraventricular tachycardia) ([place])    a. 10/2011 Admitted to [facility] w/ SVT-->broke with IV dilt.  . Recurrent Nephrolithiasis   . Sleep apnea    [facility]- in process of being evaluated now, states 12 yrs. ago was on CPAP but after having his deviated septum repaired, he had put the CPAP in storage. Pt. will get a new machine soon.   . Thoracic aortic aneurysm ([place])    a. 01/2016 CTA chest: 4.0 cm Asc Ao; b. 01/2017 Echo: nl Ao root size.  . Type II diabetes mellitus ([place])   . Umbilical hernia    "not repaired" (01/16/2013)  . VSD (ventricular septal defect) w/ persistent systolic murmur  a. 01/2013 s/p VSD closure (dacron patch);  b. 01/2017 Echo: EF 50-55%, no rwma, mild AS, mild MR, mod dil LA, dilated RV, sev dil RA, mild TR. PASP nl.    ROS:   All systems reviewed and negative except as noted in the HPI.   Past Surgical History:  Procedure Laterality Date  . CARDIAC CATHETERIZATION  > 5 yr ago   Done at [facility], reportedly clean  . CARDIAC CATHETERIZATION  11/2012  . CARDIOVERSION N/A 02/12/2015   Procedure: CARDIOVERSION;  Surgeon: Thayer Headings, MD;  Location: [facility] ENDOSCOPY;  Service: Cardiovascular;  Laterality: N/A;  . CLIPPING OF ATRIAL APPENDAGE N/A 01/07/2013   Procedure: CLIPPING OF ATRIAL APPENDAGE;  Surgeon: Grace Isaac, MD;  Location: [place];  Service: Open  Heart Surgery;  Laterality: N/A;  . CORONARY ARTERY BYPASS GRAFT N/A 01/07/2013   Procedure: CORONARY ARTERY BYPASS GRAFTING (CABG);  Surgeon: Grace Isaac, MD;  Location: [place];  Service: Open Heart Surgery;  Laterality: N/A;  Times1 using endoscopically harvested saphenous vein graft to the PDA  . EYE SURGERY Left 1990's   "clipped muscle so it wouldn't be pulling up" (01/16/2013)  . INTRAOPERATIVE TRANSESOPHAGEAL ECHOCARDIOGRAM N/A 01/07/2013   Procedure: INTRAOPERATIVE TRANSESOPHAGEAL ECHOCARDIOGRAM;  Surgeon: Grace Isaac, MD;  Location: [place];  Service: Open Heart Surgery;  Laterality: N/A;  . LEFT AND RIGHT HEART CATHETERIZATION WITH CORONARY ANGIOGRAM N/A 11/20/2012   Procedure: LEFT AND RIGHT HEART CATHETERIZATION WITH CORONARY ANGIOGRAM;  Surgeon: Jolaine Artist, MD;  Location: [facility] CATH LAB;  Service: Cardiovascular;  Laterality: N/A;  . MAZE N/A 01/07/2013   Procedure: MAZE;  Surgeon: Grace Isaac, MD;  Location: [place];  Service: Open Heart Surgery;  Laterality: N/A;  . MULTIPLE EXTRACTIONS WITH ALVEOLOPLASTY N/A 12/11/2012   Procedure: Extraction of tooth #'s 2,2,2,9,79,89,21,19,41,74,08,14,48,18,56 wioth alveoloplasty and bialteral fibrous tuberosity reductions.;  Surgeon: Lenn Cal, DDS;  Location: [facility];  Service: Oral Surgery;  Laterality: N/A;  . NASAL SEPTUM SURGERY  2004  . TEE WITHOUT CARDIOVERSION N/A 11/21/2012   Procedure: TRANSESOPHAGEAL ECHOCARDIOGRAM (TEE);  Surgeon: Jolaine Artist, MD;  Location: [facility] ENDOSCOPY;  Service: Cardiovascular;  Laterality: N/A;  . VSD REPAIR N/A 01/07/2013   Procedure: VENTRICULAR SEPTAL DEFECT (VSD) REPAIR;  Surgeon: Grace Isaac, MD;  Location: [place];  Service: Open Heart Surgery;  Laterality: N/A;     Family History  Problem Relation Age of Onset  . Cancer Mother   . Diabetes Mother      Social History   Socioeconomic History  . Marital status: Married    Spouse name: Not on file  . Number of children: Not  on file  . Years of education: Not on file  . Highest education level: Not on file  Social Needs  . Financial resource strain: Not on file  . Food insecurity - worry: Not on file  . Food insecurity - inability: Not on file  . Transportation needs - medical: Not on file  . Transportation needs - non-medical: Not on file  Occupational History  . Occupation: Psychologist, educational    Comment: Heavy work at times  Tobacco Use  . Smoking status: Never Smoker  . Smokeless tobacco: Never Used  Substance and Sexual Activity  . Alcohol use: No  . Drug use: No  . Sexual activity: Not Currently  Other Topics Concern  . Not on file  Social History Narrative   Still works full time. Works around the yard. Lives with wife.  Has 2 sisters, neither with cardiac issues.     BP 134/72   Pulse 86   Ht 6' (1.829 m)   Wt 211 lb (95.7 kg)   SpO2 98%   BMI 28.62 kg/m   Physical Exam:  Well appearing NAD HEENT: Unremarkable Neck:  6 cm JVD, no thyromegally Lymphatics:  No adenopathy Back:  No CVA tenderness Lungs:  Clear with no wheezes, rales, or rhonchi. HEART:  Regular rate rhythm, no murmurs, no rubs, no clicks Abd:  soft, positive bowel sounds, no organomegally, no rebound, no guarding Ext:  2 plus pulses, no edema, no cyanosis, no clubbing Skin:  No rashes no nodules Neuro:  CN II through XII intact, motor grossly intact  EKG - probable atrial flutter with 2:1 AV conduction and right bundle branch block, left axis  Assess/Plan: 1. Atrial fib/flutter - The patient has taken his amiodarone and his oral anticoagulants. He will undergo DC cardioversion. We will schedule that in the next few days. 2. Coronary artery disease - he is status post revascularization and denies anginal symptoms. 3. Congestive heart failure - he has diastolic dysfunction and has class II symptoms. Once he is back in sinus rhythm, hopefully he will have improvement in his symptoms. 4. Hypertension - his blood pressure  has been reasonably well controlled. He will maintain a low-sodium diet.  Cristopher Peru, M.D.

## 2017-04-26 LAB — CBC WITH DIFFERENTIAL/PLATELET
BASOS: 0 %
Basophils Absolute: 0 10*3/uL (ref 0.0–0.2)
EOS (ABSOLUTE): 0.3 10*3/uL (ref 0.0–0.4)
EOS: 4 %
Hematocrit: 43.1 % (ref 37.5–51.0)
Hemoglobin: 14 g/dL (ref 13.0–17.7)
IMMATURE GRANULOCYTES: 0 %
Immature Grans (Abs): 0 10*3/uL (ref 0.0–0.1)
Lymphocytes Absolute: 1.5 10*3/uL (ref 0.7–3.1)
Lymphs: 24 %
MCH: 30.3 pg (ref 26.6–33.0)
MCHC: 32.5 g/dL (ref 31.5–35.7)
MCV: 93 fL (ref 79–97)
MONOCYTES: 11 %
MONOS ABS: 0.7 10*3/uL (ref 0.1–0.9)
NEUTROS PCT: 61 %
Neutrophils Absolute: 3.9 10*3/uL (ref 1.4–7.0)
PLATELETS: 265 10*3/uL (ref 150–379)
RBC: 4.62 x10E6/uL (ref 4.14–5.80)
RDW: 14.4 % (ref 12.3–15.4)
WBC: 6.4 10*3/uL (ref 3.4–10.8)

## 2017-04-26 LAB — BASIC METABOLIC PANEL
BUN/Creatinine Ratio: 11 (ref 10–24)
BUN: 11 mg/dL (ref 8–27)
CALCIUM: 9.1 mg/dL (ref 8.6–10.2)
CHLORIDE: 101 mmol/L (ref 96–106)
CO2: 28 mmol/L (ref 20–29)
CREATININE: 1 mg/dL (ref 0.76–1.27)
GFR calc non Af Amer: 76 mL/min/{1.73_m2} (ref >59)
GFR, EST AFRICAN AMERICAN: 88 mL/min/{1.73_m2} (ref >59)
Glucose: 141 mg/dL — ABNORMAL HIGH (ref 65–99)
Potassium: 4.9 mmol/L (ref 3.5–5.2)
Sodium: 141 mmol/L (ref 134–144)

## 2017-04-27 ENCOUNTER — Ambulatory Visit (HOSPITAL_COMMUNITY)
Admission: RE | Admit: 2017-04-27 | Discharge: 2017-04-27 | Disposition: A | Discharge status: Home / Self Care | Payer: Managed Care, Other (non HMO) | Source: Ambulatory Visit | Attending: Internal Medicine | Admitting: Internal Medicine

## 2017-04-27 ENCOUNTER — Encounter (HOSPITAL_COMMUNITY): Payer: Self-pay | Admitting: Internal Medicine

## 2017-04-27 ENCOUNTER — Ambulatory Visit (HOSPITAL_COMMUNITY)
Admission: RE | Disposition: A | Discharge status: Home / Self Care | Payer: Self-pay | Source: Ambulatory Visit | Attending: Internal Medicine

## 2017-04-27 DIAGNOSIS — I5032 Chronic diastolic (congestive) heart failure: Secondary | ICD-10-CM | POA: Diagnosis not present

## 2017-04-27 DIAGNOSIS — Z7984 Long term (current) use of oral hypoglycemic drugs: Secondary | ICD-10-CM | POA: Insufficient documentation

## 2017-04-27 DIAGNOSIS — I2510 Atherosclerotic heart disease of native coronary artery without angina pectoris: Secondary | ICD-10-CM | POA: Insufficient documentation

## 2017-04-27 DIAGNOSIS — I481 Persistent atrial fibrillation: Secondary | ICD-10-CM | POA: Diagnosis not present

## 2017-04-27 DIAGNOSIS — I110 Hypertensive heart disease with heart failure: Secondary | ICD-10-CM | POA: Insufficient documentation

## 2017-04-27 DIAGNOSIS — I4891 Unspecified atrial fibrillation: Secondary | ICD-10-CM | POA: Diagnosis present

## 2017-04-27 DIAGNOSIS — Z7901 Long term (current) use of anticoagulants: Secondary | ICD-10-CM | POA: Insufficient documentation

## 2017-04-27 DIAGNOSIS — J449 Chronic obstructive pulmonary disease, unspecified: Secondary | ICD-10-CM | POA: Insufficient documentation

## 2017-04-27 DIAGNOSIS — Z79899 Other long term (current) drug therapy: Secondary | ICD-10-CM | POA: Insufficient documentation

## 2017-04-27 DIAGNOSIS — I4892 Unspecified atrial flutter: Secondary | ICD-10-CM | POA: Insufficient documentation

## 2017-04-27 DIAGNOSIS — E119 Type 2 diabetes mellitus without complications: Secondary | ICD-10-CM | POA: Diagnosis not present

## 2017-04-27 DIAGNOSIS — Z951 Presence of aortocoronary bypass graft: Secondary | ICD-10-CM | POA: Diagnosis not present

## 2017-04-27 DIAGNOSIS — I712 Thoracic aortic aneurysm, without rupture: Secondary | ICD-10-CM | POA: Insufficient documentation

## 2017-04-27 DIAGNOSIS — E785 Hyperlipidemia, unspecified: Secondary | ICD-10-CM | POA: Insufficient documentation

## 2017-04-27 HISTORY — PX: CARDIOVERSION: EP1203

## 2017-04-27 LAB — GLUCOSE, CAPILLARY
GLUCOSE-CAPILLARY: 174 mg/dL — AB (ref 65–99)
GLUCOSE-CAPILLARY: 144 mg/dL — AB (ref 65–99)

## 2017-04-27 SURGERY — CARDIOVERSION (CATH LAB)
Anesthesia: LOCAL

## 2017-04-27 MED ORDER — SODIUM CHLORIDE 0.9 % IV SOLN: 250.0000 mL | INTRAVENOUS | Status: DC | PRN

## 2017-04-27 MED ORDER — ACETAMINOPHEN 325 MG PO TABS: 650.0000 mg | ORAL_TABLET | ORAL | Status: DC | PRN

## 2017-04-27 MED ORDER — MIDAZOLAM HCL 2 MG/2ML IJ SOLN
INTRAMUSCULAR | Status: DC | PRN
  Administered 2017-04-27 (×2): 1 mg via INTRAVENOUS
  Administered 2017-04-27: 2 mg via INTRAVENOUS

## 2017-04-27 MED ORDER — SODIUM CHLORIDE 0.9% FLUSH: 3.0000 mL | INTRAVENOUS | Status: DC | PRN

## 2017-04-27 MED ORDER — MIDAZOLAM HCL 2 MG/2ML IJ SOLN
INTRAMUSCULAR | Status: AC
  Filled 2017-04-27: qty 2

## 2017-04-27 MED ORDER — ONDANSETRON HCL 4 MG/2ML IJ SOLN: 4.0000 mg | Freq: Four times a day (QID) | INTRAMUSCULAR | Status: DC | PRN

## 2017-04-27 MED ORDER — SODIUM CHLORIDE 0.9% FLUSH: 3.0000 mL | Freq: Two times a day (BID) | INTRAVENOUS | Status: DC

## 2017-04-27 MED ORDER — FENTANYL CITRATE (PF) 100 MCG/2ML IJ SOLN
INTRAMUSCULAR | Status: DC | PRN
  Administered 2017-04-27: 25 ug via INTRAVENOUS
  Administered 2017-04-27 (×2): 12.5 ug via INTRAVENOUS

## 2017-04-27 MED ORDER — FENTANYL CITRATE (PF) 100 MCG/2ML IJ SOLN
INTRAMUSCULAR | Status: AC
Start: 2017-04-27 — End: 2017-04-27
  Filled 2017-04-27: qty 2

## 2017-04-27 SURGICAL SUPPLY — 1 items: ELECT DEFIB PAD ADLT CADENCE (PAD) ×1 IMPLANT

## 2017-04-27 NOTE — Interval H&P Note (Signed)
History and Physical Interval Note:  04/27/2017 7:35 AM  Aaron Jones  has presented today for surgery, with the diagnosis of A-Flutter  The various methods of treatment have been discussed with the patient and family. After consideration of risks, benefits and other options for treatment, the patient has consented to  Procedure(s): CARDIOVERSION (N/A) as a surgical intervention .  The patient's history has been reviewed, patient examined, no change in status, stable for surgery.  I have reviewed the patient's chart and labs.  Questions were answered to the patient's satisfaction.     Cristopher Peru

## 2017-04-27 NOTE — Interval H&P Note (Signed)
History and Physical Interval Note:  04/27/2017 7:36 AM  Aaron Jones  has presented today for surgery, with the diagnosis of A-Flutter  The various methods of treatment have been discussed with the patient and family. After consideration of risks, benefits and other options for treatment, the patient has consented to  Procedure(s): CARDIOVERSION (N/A) as a surgical intervention .  The patient's history has been reviewed, patient examined, no change in status, stable for surgery.  I have reviewed the patient's chart and labs.  Questions were answered to the patient's satisfaction.     Cristopher Peru

## 2017-04-27 NOTE — Discharge Instructions (Signed)
Electrical Cardioversion, Care After °This sheet gives you information about how to care for yourself after your procedure. Your health care provider may also give you more specific instructions. If you have problems or questions, contact your health care provider. °What can I expect after the procedure? °After the procedure, it is common to have: °· Some redness on the skin where the shocks were given. ° °Follow these instructions at home: °· Do not drive for 24 hours if you were given a medicine to help you relax (sedative). °· Take over-the-counter and prescription medicines only as told by your health care provider. °· Ask your health care provider how to check your pulse. Check it often. °· Rest for 48 hours after the procedure or as told by your health care provider. °· Avoid or limit your caffeine use as told by your health care provider. °Contact a health care provider if: °· You feel like your heart is beating too quickly or your pulse is not regular. °· You have a serious muscle cramp that does not go away. °Get help right away if: °· You have discomfort in your chest. °· You are dizzy or you feel faint. °· You have trouble breathing or you are short of breath. °· Your speech is slurred. °· You have trouble moving an arm or leg on one side of your body. °· Your fingers or toes turn cold or blue. °This information is not intended to replace advice given to you by your health care provider. Make sure you discuss any questions you have with your health care provider. °Document Released: 03/12/2013 Document Revised: 12/24/2015 Document Reviewed: 11/26/2015 °Elsevier Interactive Patient Education © 2018 [employer] ° °

## 2017-04-27 NOTE — Progress Notes (Signed)
Pt walked 2 laps around unit. Said he felt great. VSS throughout and after.

## 2017-04-30 MED FILL — Midazolam HCl Inj 2 MG/2ML (Base Equivalent): INTRAMUSCULAR | Qty: 2 | Status: AC

## 2017-05-02 ENCOUNTER — Ambulatory Visit: Admit: 2017-05-02 | Payer: Managed Care, Other (non HMO) | Admitting: Unknown Physician Specialty

## 2017-05-02 SURGERY — COLONOSCOPY WITH PROPOFOL
Anesthesia: General

## 2017-05-18 ENCOUNTER — Encounter: Payer: Self-pay | Admitting: Internal Medicine

## 2017-05-22 ENCOUNTER — Encounter: Payer: Self-pay | Admitting: Internal Medicine

## 2017-05-22 ENCOUNTER — Ambulatory Visit: Payer: Managed Care, Other (non HMO) | Admitting: Internal Medicine

## 2017-05-22 VITALS — BP 150/74 | HR 63 | Ht 72.0 in | Wt 211.0 lb

## 2017-05-22 DIAGNOSIS — I10 Essential (primary) hypertension: Secondary | ICD-10-CM

## 2017-05-22 DIAGNOSIS — I4892 Unspecified atrial flutter: Secondary | ICD-10-CM

## 2017-05-22 DIAGNOSIS — I2510 Atherosclerotic heart disease of native coronary artery without angina pectoris: Secondary | ICD-10-CM | POA: Diagnosis not present

## 2017-05-22 MED ORDER — AMIODARONE HCL 200 MG PO TABS: 200.0000 mg | ORAL_TABLET | Freq: Every day | ORAL | 3 refills | Status: DC

## 2017-05-22 MED ORDER — AMIODARONE HCL 200 MG PO TABS: 400.0000 mg | ORAL_TABLET | Freq: Every day | ORAL | 0 refills | Status: DC

## 2017-05-22 NOTE — Patient Instructions (Signed)
Medication Instructions:  Your physician has recommended you make the following change in your medication:  1.  Decrease your amiodarone to 1 tablet (200 mg) daily by mouth on June 05, 2017.   Labwork: None ordered.   Testing/Procedures: None ordered.   Follow-Up: Your physician wants you to follow-up in: April 2019 with Dr. Lovena Le.  Any Other Special Instructions Will Be Listed Below (If Applicable).     If you need a refill on your cardiac medications before your next appointment, please call your pharmacy.

## 2017-05-22 NOTE — Progress Notes (Signed)
HPI [name] returns today for ongoing evaluation and management of atrial fibrillation. He has been on amiodarone and underwent DCCV about 3 weeks ago. He had very rapid atrial fib. Since his DCCV, his heart rates have been in the 60's. No syncope. He denies chest pain. No edema. His dyspnea is improved.  Allergies  Allergen Reactions  . Biaxin [Clarithromycin] Nausea Only     Current Outpatient Medications  Medication Sig Dispense Refill  . acetaminophen (TYLENOL) 500 MG tablet Take 1,000 mg by mouth daily as needed for moderate pain or headache.    Marland Kitchen amiodarone (PACERONE) 200 MG tablet Take 2 tablets (400 mg total) by mouth daily for 13 days. 13 tablet 0  . apixaban (ELIQUIS) 5 MG TABS tablet Take 1 tablet (5 mg total) by mouth 2 (two) times daily. 180 tablet 3  . atorvastatin (LIPITOR) 20 MG tablet Take 1 tablet (20 mg total) by mouth at bedtime. 90 tablet 3  . furosemide (LASIX) 40 MG tablet TAKE 1 TABLET BY MOUTH EVERY DAY 30 tablet 1  . glipiZIDE (GLUCOTROL) 10 MG tablet Take 10 mg by mouth daily.    . Glucose Blood (BLOOD GLUCOSE TEST STRIPS) STRP 1 strip by In Vitro route 2 (two) times daily. 100 each 0  . metFORMIN (GLUCOPHAGE) 1000 MG tablet Take 1,000 mg by mouth 2 (two) times daily.  1  . metoprolol tartrate (LOPRESSOR) 100 MG tablet Take 1 tablet (100 mg total) by mouth 2 (two) times daily. 180 tablet 3  . metoprolol tartrate (LOPRESSOR) 25 MG tablet Take 2 tablets (50 mg total) by mouth 2 (two) times daily. Total dosage should be Metoprolol Tartrate 150 mg twice a day 360 tablet 0  . omeprazole (PRILOSEC) 40 MG capsule Take 40 mg by mouth daily before breakfast.     . [START ON 06/05/2017] amiodarone (PACERONE) 200 MG tablet Take 1 tablet (200 mg total) by mouth daily. 90 tablet 3   No current facility-administered medications for this visit.      Past Medical History:  Diagnosis Date  . Atrial fibrillation with rapid ventricular response ([facility]) 01/16/2013   a.  01/2013 s/p R & L Maze and LAA clipping in setting of VSD repair;  b. Prev on amiodarone;  c. CHA2DS2VASc = 4-->Apixaban;  c. 01/2013 Recurrent afib/flutter requiring DCCV.  Marland Kitchen COPD (chronic obstructive pulmonary disease) ([place])   . Coronary artery disease    a. 01/2013 s/p CABG x 1 (VG->PDA) in setting of VSD repair.  Marland Kitchen GERD (gastroesophageal reflux disease)   . Hyperlipidemia   . Hypertension    PCP- Kathlynn Grate, phone; [phone]  . PAT (paroxysmal atrial tachycardia) ([place])    a. 02/2015 s/p DCCV;  b. 12/2016 recurrent PAT - unchanged with escalating BB dose (rates 106-107).  Marland Kitchen PSVT (paroxysmal supraventricular tachycardia) ([place])    a. 10/2011 Admitted to [facility] w/ SVT-->broke with IV dilt.  . Recurrent Nephrolithiasis   . Sleep apnea    [facility]- in process of being evaluated now, states 12 yrs. ago was on CPAP but after having his deviated septum repaired, he had put the CPAP in storage. Pt. will get a new machine soon.   . Thoracic aortic aneurysm ([place])    a. 01/2016 CTA chest: 4.0 cm Asc Ao; b. 01/2017 Echo: nl Ao root size.  . Type II diabetes mellitus ([place])   . Umbilical hernia    "not repaired" (01/16/2013)  . VSD (ventricular septal defect) w/ persistent  systolic murmur    a. 08/5007 s/p VSD closure (dacron patch);  b. 01/2017 Echo: EF 50-55%, no rwma, mild AS, mild MR, mod dil LA, dilated RV, sev dil RA, mild TR. PASP nl.    ROS:   All systems reviewed and negative except as noted in the HPI.   Past Surgical History:  Procedure Laterality Date  . CARDIAC CATHETERIZATION  > 5 yr ago   Done at [facility], reportedly clean  . CARDIAC CATHETERIZATION  11/2012  . CARDIOVERSION N/A 02/12/2015   Procedure: CARDIOVERSION;  Surgeon: Thayer Headings, MD;  Location: [facility] ENDOSCOPY;  Service: Cardiovascular;  Laterality: N/A;  . CARDIOVERSION N/A 04/27/2017   Procedure: CARDIOVERSION;  Surgeon: Evans Lance, MD;  Location: [place] CV LAB;  Service: Cardiovascular;  Laterality: N/A;  .  CLIPPING OF ATRIAL APPENDAGE N/A 01/07/2013   Procedure: CLIPPING OF ATRIAL APPENDAGE;  Surgeon: Grace Isaac, MD;  Location: [place];  Service: Open Heart Surgery;  Laterality: N/A;  . CORONARY ARTERY BYPASS GRAFT N/A 01/07/2013   Procedure: CORONARY ARTERY BYPASS GRAFTING (CABG);  Surgeon: Grace Isaac, MD;  Location: [place];  Service: Open Heart Surgery;  Laterality: N/A;  Times1 using endoscopically harvested saphenous vein graft to the PDA  . EYE SURGERY Left 1990's   "clipped muscle so it wouldn't be pulling up" (01/16/2013)  . INTRAOPERATIVE TRANSESOPHAGEAL ECHOCARDIOGRAM N/A 01/07/2013   Procedure: INTRAOPERATIVE TRANSESOPHAGEAL ECHOCARDIOGRAM;  Surgeon: Grace Isaac, MD;  Location: [place];  Service: Open Heart Surgery;  Laterality: N/A;  . LEFT AND RIGHT HEART CATHETERIZATION WITH CORONARY ANGIOGRAM N/A 11/20/2012   Procedure: LEFT AND RIGHT HEART CATHETERIZATION WITH CORONARY ANGIOGRAM;  Surgeon: Jolaine Artist, MD;  Location: [facility] CATH LAB;  Service: Cardiovascular;  Laterality: N/A;  . MAZE N/A 01/07/2013   Procedure: MAZE;  Surgeon: Grace Isaac, MD;  Location: [place];  Service: Open Heart Surgery;  Laterality: N/A;  . MULTIPLE EXTRACTIONS WITH ALVEOLOPLASTY N/A 12/11/2012   Procedure: Extraction of tooth #'s 3,8,1,8,29,93,71,69,67,89,38,10,17,51,02 wioth alveoloplasty and bialteral fibrous tuberosity reductions.;  Surgeon: Lenn Cal, DDS;  Location: [facility];  Service: Oral Surgery;  Laterality: N/A;  . NASAL SEPTUM SURGERY  2004  . TEE WITHOUT CARDIOVERSION N/A 11/21/2012   Procedure: TRANSESOPHAGEAL ECHOCARDIOGRAM (TEE);  Surgeon: Jolaine Artist, MD;  Location: [facility] ENDOSCOPY;  Service: Cardiovascular;  Laterality: N/A;  . VSD REPAIR N/A 01/07/2013   Procedure: VENTRICULAR SEPTAL DEFECT (VSD) REPAIR;  Surgeon: Grace Isaac, MD;  Location: [place];  Service: Open Heart Surgery;  Laterality: N/A;     Family History  Problem Relation Age of Onset  . Cancer Mother     . Diabetes Mother      Social History   Socioeconomic History  . Marital status: Married    Spouse name: Not on file  . Number of children: Not on file  . Years of education: Not on file  . Highest education level: Not on file  Social Needs  . Financial resource strain: Not on file  . Food insecurity - worry: Not on file  . Food insecurity - inability: Not on file  . Transportation needs - medical: Not on file  . Transportation needs - non-medical: Not on file  Occupational History  . Occupation: Psychologist, educational    Comment: Heavy work at times  Tobacco Use  . Smoking status: Never Smoker  . Smokeless tobacco: Never Used  Substance and Sexual Activity  . Alcohol use: No  . Drug use:  No  . Sexual activity: Not Currently  Other Topics Concern  . Not on file  Social History Narrative   Still works full time. Works around the yard. Lives with wife. Has 2 sisters, neither with cardiac issues.     BP (!) 150/74   Pulse 63   Ht 6' (1.829 m)   Wt 211 lb (95.7 kg)   SpO2 97%   BMI 28.62 kg/m   Physical Exam:  Well appearing 71 yo man, NAD HEENT: Unremarkable Neck:  6 cm JVD, no thyromegally Lymphatics:  No adenopathy Back:  No CVA tenderness Lungs:  Clear with no wheezes HEART:  Regular rate rhythm, 3/6 systolic murmurs, no rubs, no clicks Abd:  soft, positive bowel sounds, no organomegally, no rebound, no guarding Ext:  2 plus pulses, no edema, no cyanosis, no clubbing Skin:  No rashes no nodules Neuro:  CN II through XII intact, motor grossly intact  EKG - NSR with RBBB and LAFB  Assess/Plan: 1. Atrial fib - he is s/p DCCV and maintaining NSR. He will continue his amiodarone at 400 mg daily for another 10 days then decrease the dose down to 200 mg daily. 2. CAD - he denies anginal symptoms. He will continue his current meds.  3. Sleep apnea - he will continue his CPAP 4. VSD - he is s/p closure and did not have any residual on 2D echo several months  ago.  Mikle Bosworth.D.

## 2017-05-26 ENCOUNTER — Other Ambulatory Visit: Payer: Self-pay | Admitting: Nurse Practitioner

## 2017-07-23 ENCOUNTER — Telehealth: Payer: Self-pay | Admitting: Internal Medicine

## 2017-07-23 NOTE — Telephone Encounter (Signed)
Returned call to wife.  Wife and husband concerned d/t Pt HR has been slowly increasing since reduced amiodarone 06/05/2017.  Informed wife amiodarone lasts in your system for an extended time, so a gradual increase in HR may be expected.  Per wife, when Pt in afib he feels very tired.  At this time Pt has no s/s of afib.  Also notified wife to monitor for HR greater than 100, and a HR that is irregular with Pt being symptomatic (tired).  Wife indicates understanding.   Thanked for the call.

## 2017-07-23 NOTE — Telephone Encounter (Signed)
STAT if HR is under 50 or over 120 (normal HR is 60-100 beats per minute)  1) What is your heart rate? 91  2) Do you have a log of your heart rate readings (document readings)? no  3) Do you have any other symptoms? No

## 2017-10-05 ENCOUNTER — Encounter: Payer: Self-pay | Admitting: Internal Medicine

## 2017-10-05 ENCOUNTER — Ambulatory Visit: Payer: Managed Care, Other (non HMO) | Admitting: Internal Medicine

## 2017-10-05 VITALS — BP 120/70 | HR 88 | Ht 72.0 in | Wt 213.4 lb

## 2017-10-05 DIAGNOSIS — I4892 Unspecified atrial flutter: Secondary | ICD-10-CM | POA: Diagnosis not present

## 2017-10-05 DIAGNOSIS — I10 Essential (primary) hypertension: Secondary | ICD-10-CM | POA: Diagnosis not present

## 2017-10-05 NOTE — Progress Notes (Signed)
HPI [name] returns today for followup of atrial fib. He is a pleasant 72 yo man with persistent atrial fib who was placed on amio and underwent DCCV. He had been on 400 of amio and reduced down to 200 mg daily. In the interim, he has done well with no chest pain or sob. He is back working He denies palpitations, chest pain or sob. No syncope. He is pending umbilical hernia surgery. He is pending colonoscopy. Allergies  Allergen Reactions  . Biaxin [Clarithromycin] Nausea Only     Current Outpatient Medications  Medication Sig Dispense Refill  . acetaminophen (TYLENOL) 500 MG tablet Take 1,000 mg by mouth daily as needed for moderate pain or headache.    Marland Kitchen amiodarone (PACERONE) 200 MG tablet Take 1 tablet (200 mg total) by mouth daily. 90 tablet 3  . apixaban (ELIQUIS) 5 MG TABS tablet Take 1 tablet (5 mg total) by mouth 2 (two) times daily. 180 tablet 3  . atorvastatin (LIPITOR) 20 MG tablet Take 1 tablet (20 mg total) by mouth at bedtime. 90 tablet 3  . furosemide (LASIX) 40 MG tablet TAKE 1 TABLET BY MOUTH EVERY DAY 30 tablet 1  . glipiZIDE (GLUCOTROL) 10 MG tablet Take 10 mg by mouth daily.    . Glucose Blood (BLOOD GLUCOSE TEST STRIPS) STRP 1 strip by In Vitro route 2 (two) times daily. 100 each 0  . metFORMIN (GLUCOPHAGE) 1000 MG tablet Take 1,000 mg by mouth 2 (two) times daily.  1  . metoprolol tartrate (LOPRESSOR) 100 MG tablet Take 1 tablet (100 mg total) by mouth 2 (two) times daily. 180 tablet 3  . metoprolol tartrate (LOPRESSOR) 50 MG tablet Take 50 mg by mouth 2 (two) times daily.    Marland Kitchen omeprazole (PRILOSEC) 40 MG capsule Take 40 mg by mouth daily before breakfast.     . amiodarone (PACERONE) 200 MG tablet Take 2 tablets (400 mg total) by mouth daily for 13 days. 13 tablet 0   No current facility-administered medications for this visit.      Past Medical History:  Diagnosis Date  . Atrial fibrillation with rapid ventricular response ([place]) 01/16/2013   a. 01/2013  s/p R & L Maze and LAA clipping in setting of VSD repair;  b. Prev on amiodarone;  c. CHA2DS2VASc = 4-->Apixaban;  c. 01/2013 Recurrent afib/flutter requiring DCCV.  Marland Kitchen COPD (chronic obstructive pulmonary disease) ([place])   . Coronary artery disease    a. 01/2013 s/p CABG x 1 (VG->PDA) in setting of VSD repair.  Marland Kitchen GERD (gastroesophageal reflux disease)   . Hyperlipidemia   . Hypertension    PCP- Kathlynn Grate, phone; [phone]  . PAT (paroxysmal atrial tachycardia) ([place])    a. 02/2015 s/p DCCV;  b. 12/2016 recurrent PAT - unchanged with escalating BB dose (rates 106-107).  Marland Kitchen PSVT (paroxysmal supraventricular tachycardia) ([place])    a. 10/2011 Admitted to [facility] w/ SVT-->broke with IV dilt.  . Recurrent Nephrolithiasis   . Sleep apnea    [facility]- in process of being evaluated now, states 12 yrs. ago was on CPAP but after having his deviated septum repaired, he had put the CPAP in storage. Pt. will get a new machine soon.   . Thoracic aortic aneurysm ([place])    a. 01/2016 CTA chest: 4.0 cm Asc Ao; b. 01/2017 Echo: nl Ao root size.  . Type II diabetes mellitus ([place])   . Umbilical hernia    "not repaired" (01/16/2013)  .  VSD (ventricular septal defect) w/ persistent systolic murmur    a. 12/8936 s/p VSD closure (dacron patch);  b. 01/2017 Echo: EF 50-55%, no rwma, mild AS, mild MR, mod dil LA, dilated RV, sev dil RA, mild TR. PASP nl.    ROS:   All systems reviewed and negative except as noted in the HPI.   Past Surgical History:  Procedure Laterality Date  . CARDIAC CATHETERIZATION  > 5 yr ago   Done at [facility], reportedly clean  . CARDIAC CATHETERIZATION  11/2012  . CARDIOVERSION N/A 02/12/2015   Procedure: CARDIOVERSION;  Surgeon: Thayer Headings, MD;  Location: [facility] ENDOSCOPY;  Service: Cardiovascular;  Laterality: N/A;  . CARDIOVERSION N/A 04/27/2017   Procedure: CARDIOVERSION;  Surgeon: Evans Lance, MD;  Location: [place] CV LAB;  Service: Cardiovascular;  Laterality: N/A;  . CLIPPING  OF ATRIAL APPENDAGE N/A 01/07/2013   Procedure: CLIPPING OF ATRIAL APPENDAGE;  Surgeon: Grace Isaac, MD;  Location: [place];  Service: Open Heart Surgery;  Laterality: N/A;  . CORONARY ARTERY BYPASS GRAFT N/A 01/07/2013   Procedure: CORONARY ARTERY BYPASS GRAFTING (CABG);  Surgeon: Grace Isaac, MD;  Location: [place];  Service: Open Heart Surgery;  Laterality: N/A;  Times1 using endoscopically harvested saphenous vein graft to the PDA  . EYE SURGERY Left 1990's   "clipped muscle so it wouldn't be pulling up" (01/16/2013)  . INTRAOPERATIVE TRANSESOPHAGEAL ECHOCARDIOGRAM N/A 01/07/2013   Procedure: INTRAOPERATIVE TRANSESOPHAGEAL ECHOCARDIOGRAM;  Surgeon: Grace Isaac, MD;  Location: [place];  Service: Open Heart Surgery;  Laterality: N/A;  . LEFT AND RIGHT HEART CATHETERIZATION WITH CORONARY ANGIOGRAM N/A 11/20/2012   Procedure: LEFT AND RIGHT HEART CATHETERIZATION WITH CORONARY ANGIOGRAM;  Surgeon: Jolaine Artist, MD;  Location: [facility] CATH LAB;  Service: Cardiovascular;  Laterality: N/A;  . MAZE N/A 01/07/2013   Procedure: MAZE;  Surgeon: Grace Isaac, MD;  Location: [place];  Service: Open Heart Surgery;  Laterality: N/A;  . MULTIPLE EXTRACTIONS WITH ALVEOLOPLASTY N/A 12/11/2012   Procedure: Extraction of tooth #'s 1,0,1,7,51,02,58,52,77,82,42,35,36,14,43 wioth alveoloplasty and bialteral fibrous tuberosity reductions.;  Surgeon: Lenn Cal, DDS;  Location: [facility];  Service: Oral Surgery;  Laterality: N/A;  . NASAL SEPTUM SURGERY  2004  . TEE WITHOUT CARDIOVERSION N/A 11/21/2012   Procedure: TRANSESOPHAGEAL ECHOCARDIOGRAM (TEE);  Surgeon: Jolaine Artist, MD;  Location: [facility] ENDOSCOPY;  Service: Cardiovascular;  Laterality: N/A;  . VSD REPAIR N/A 01/07/2013   Procedure: VENTRICULAR SEPTAL DEFECT (VSD) REPAIR;  Surgeon: Grace Isaac, MD;  Location: [place];  Service: Open Heart Surgery;  Laterality: N/A;     Family History  Problem Relation Age of Onset  . Cancer Mother   .  Diabetes Mother      Social History   Socioeconomic History  . Marital status: Married    Spouse name: Not on file  . Number of children: Not on file  . Years of education: Not on file  . Highest education level: Not on file  Occupational History  . Occupation: Psychologist, educational    Comment: Heavy work at times  Social Needs  . Financial resource strain: Not on file  . Food insecurity:    Worry: Not on file    Inability: Not on file  . Transportation needs:    Medical: Not on file    Non-medical: Not on file  Tobacco Use  . Smoking status: Never Smoker  . Smokeless tobacco: Never Used  Substance and Sexual Activity  . Alcohol use:  No  . Drug use: No  . Sexual activity: Not Currently  Lifestyle  . Physical activity:    Days per week: Not on file    Minutes per session: Not on file  . Stress: Not on file  Relationships  . Social connections:    Talks on phone: Not on file    Gets together: Not on file    Attends religious service: Not on file    Active member of club or organization: Not on file    Attends meetings of clubs or organizations: Not on file    Relationship status: Not on file  . Intimate partner violence:    Fear of current or ex partner: Not on file    Emotionally abused: Not on file    Physically abused: Not on file    Forced sexual activity: Not on file  Other Topics Concern  . Not on file  Social History Narrative   Still works full time. Works around the yard. Lives with wife. Has 2 sisters, neither with cardiac issues.     BP 120/70   Pulse 88   Ht 6' (1.829 m)   Wt 213 lb 6.4 oz (96.8 kg)   SpO2 96%   BMI 28.94 kg/m   Physical Exam:  Well appearing 72 yo man, NAD HEENT: Unremarkable Neck:  6 cm JVD, no thyromegally Lymphatics:  No adenopathy Back:  No CVA tenderness Lungs:  Clear with no wheezes HEART:  Regular rate rhythm, 2/6 systolic blowing murmurs, no rubs, no clicks Abd:  soft, positive bowel sounds, no organomegally, no  rebound, no guarding Ext:  2 plus pulses, no edema, no cyanosis, no clubbing Skin:  No rashes no nodules Neuro:  CN II through XII intact, motor grossly intact  EKG - NSR with RBBB and left axis  Assess/Plan: 1. Persistent atrial fib - he is maintaining NSR on amiodarone 200 mg daily. He will continue his current meds.  2. CAD - he remains active and denies anginal symptoms.  3. VSD - he is s/p repair. He has a murmur on exam but is asymptomatic. He will undergo watchful waiting.  4. Preop evalu - the patient is pending umbilical hernia repair and colonoscopy. I think he is low risk for major cardiac complications.   Aaron Jones.D

## 2017-10-05 NOTE — Patient Instructions (Signed)

## 2017-12-03 ENCOUNTER — Other Ambulatory Visit: Payer: Self-pay | Admitting: Internal Medicine

## 2018-02-14 ENCOUNTER — Other Ambulatory Visit: Payer: Self-pay | Admitting: Internal Medicine

## 2018-02-14 DIAGNOSIS — I714 Abdominal aortic aneurysm, without rupture, unspecified: Secondary | ICD-10-CM

## 2018-02-26 ENCOUNTER — Ambulatory Visit: Admission: RE | Admit: 2018-02-26 | Payer: Managed Care, Other (non HMO) | Source: Ambulatory Visit

## 2018-03-08 ENCOUNTER — Telehealth: Payer: Self-pay | Admitting: *Deleted

## 2018-03-08 NOTE — Telephone Encounter (Signed)
Pt takes Eliquis for afib with CHADS2VASc score of 5 (age, HTN, CHF, CAD, DM). Renal function is normal. Recommend holding Eliquis for 1-2 days prior to procedure.

## 2018-03-08 NOTE — Telephone Encounter (Signed)
I spoke with the pt's wife. His endoscopy is not until Dec 27th. He has an appointment with Dr 05/15/18 and clearance can be addressed then- pharmacy has already weighed in. 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  Kerin Ransom PA-C 03/08/2018 4:41 PM

## 2018-03-08 NOTE — Telephone Encounter (Signed)
Will ask pharmacy to comment on Eliquis, then contact patient.  Kerin Ransom PA-C 03/08/2018 2:42 PM

## 2018-03-08 NOTE — Telephone Encounter (Signed)
   [facility] Pre-operative Risk Assessment    Request for surgical clearance:  1. What type of surgery is being performed? Colonoscopy and/or Upper Endoscopy  2. When is this surgery scheduled? 05/31/2018  3. What type of clearance is required (medical clearance vs. Pharmacy clearance to hold med vs. Both)? Both  4. Are there any medications that need to be held prior to surgery and how long? Eliquis, 3 days   5. Practice name and name of physician performing surgery? [facility] or [facility], [facility], Denice Paradise, NP  6. What is your office phone number [phone]    7.   What is your office fax number [phone]  8.   Anesthesia type (None, local, MAC, general) ? Monitored Anesthesia   Calub Tarnow 03/08/2018, 2:07 PM  _________________________________________________________________   (provider comments below)

## 2018-04-17 ENCOUNTER — Other Ambulatory Visit: Payer: Self-pay

## 2018-04-17 MED ORDER — APIXABAN 5 MG PO TABS: 5.0000 mg | ORAL_TABLET | Freq: Two times a day (BID) | ORAL | 1 refills | Status: DC

## 2018-04-29 ENCOUNTER — Telehealth: Payer: Self-pay

## 2018-04-30 MED ORDER — ATORVASTATIN CALCIUM 20 MG PO TABS: 20.0000 mg | ORAL_TABLET | Freq: Every day | ORAL | 0 refills | Status: DC

## 2018-04-30 NOTE — Telephone Encounter (Signed)
rx for atorvastatin sent to local pharmacy.

## 2018-05-15 ENCOUNTER — Encounter: Payer: Self-pay | Admitting: Internal Medicine

## 2018-05-15 ENCOUNTER — Ambulatory Visit: Payer: Managed Care, Other (non HMO) | Admitting: Internal Medicine

## 2018-05-15 VITALS — BP 146/80 | HR 86 | Ht 72.0 in | Wt 214.0 lb

## 2018-05-15 DIAGNOSIS — Q210 Ventricular septal defect: Secondary | ICD-10-CM

## 2018-05-15 DIAGNOSIS — I4819 Other persistent atrial fibrillation: Secondary | ICD-10-CM

## 2018-05-15 DIAGNOSIS — I2510 Atherosclerotic heart disease of native coronary artery without angina pectoris: Secondary | ICD-10-CM | POA: Diagnosis not present

## 2018-05-15 DIAGNOSIS — I712 Thoracic aortic aneurysm, without rupture, unspecified: Secondary | ICD-10-CM

## 2018-05-15 DIAGNOSIS — I340 Nonrheumatic mitral (valve) insufficiency: Secondary | ICD-10-CM | POA: Diagnosis not present

## 2018-05-15 NOTE — Patient Instructions (Addendum)
Medication Instructions:  Your physician recommends that you continue on your current medications as directed. Please refer to the Current Medication list given to you today.  Labwork: You will get BMP   Testing/Procedures: Your physician has requested that you have an echocardiogram. Echocardiography is a painless test that uses sound waves to create images of your heart. It provides your doctor with information about the size and shape of your heart and how well your heart's chambers and valves are working. This procedure takes approximately one hour. There are no restrictions for this procedure.  Please schedule for an ECHO  Non-Cardiac CT Angiography (CTA), is a special type of CT scan that uses a computer to produce multi-dimensional views of major blood vessels throughout the body. In CT angiography, a contrast material is injected through an IV to help visualize the blood vessels.  Please schedule for a chest CT.  Follow-Up: Your physician wants you to follow-up in: one year with Dr. Lovena Le.   You will receive a reminder letter in the mail two months in advance. If you don't receive a letter, please call our office to schedule the follow-up appointment.  Any Other Special Instructions Will Be Listed Below (If Applicable).  If you need a refill on your cardiac medications before your next appointment, please call your pharmacy.

## 2018-05-15 NOTE — Progress Notes (Signed)
HPI [name] returns today for followup of his persistent atrial fib. He has a h/o CAD, s/p CABG and VSD with closure, thoracic aneurysm, and atrial flutter s/p ablation. He was placed on amiodarone for his symptomatic atrial fib over a year ago. He has done well in the interim with no chest pain, sob, or peripheral edema.  Allergies  Allergen Reactions  . Biaxin [Clarithromycin] Nausea Only     Current Outpatient Medications  Medication Sig Dispense Refill  . acetaminophen (TYLENOL) 500 MG tablet Take 1,000 mg by mouth daily as needed for moderate pain or headache.    Marland Kitchen amiodarone (PACERONE) 200 MG tablet Take 1 tablet (200 mg total) by mouth daily. 90 tablet 3  . apixaban (ELIQUIS) 5 MG TABS tablet Take 1 tablet (5 mg total) by mouth 2 (two) times daily. 180 tablet 1  . atorvastatin (LIPITOR) 20 MG tablet Take 1 tablet (20 mg total) by mouth at bedtime. 90 tablet 0  . furosemide (LASIX) 40 MG tablet TAKE 1 TABLET BY MOUTH EVERY DAY 30 tablet 1  . glipiZIDE (GLUCOTROL) 10 MG tablet Take 5-10 mg by mouth 2 (two) times daily before a meal. 10mg  in the am and 5mg  in the pm    . Glucose Blood (BLOOD GLUCOSE TEST STRIPS) STRP 1 strip by In Vitro route 2 (two) times daily. 100 each 0  . metFORMIN (GLUCOPHAGE) 1000 MG tablet Take 1,000 mg by mouth 2 (two) times daily.  1  . metoprolol tartrate (LOPRESSOR) 100 MG tablet Take 1 tablet (100 mg total) by mouth 2 (two) times daily. 180 tablet 3  . metoprolol tartrate (LOPRESSOR) 50 MG tablet Take 50 mg by mouth 2 (two) times daily.    Marland Kitchen omeprazole (PRILOSEC) 40 MG capsule Take 40 mg by mouth daily before breakfast.      No current facility-administered medications for this visit.      Past Medical History:  Diagnosis Date  . Atrial fibrillation with rapid ventricular response ([place]) 01/16/2013   a. 01/2013 s/p R & L Maze and LAA clipping in setting of VSD repair;  b. Prev on amiodarone;  c. CHA2DS2VASc = 4-->Apixaban;  c. 01/2013 Recurrent  afib/flutter requiring DCCV.  Marland Kitchen COPD (chronic obstructive pulmonary disease) ([place])   . Coronary artery disease    a. 01/2013 s/p CABG x 1 (VG->PDA) in setting of VSD repair.  Marland Kitchen GERD (gastroesophageal reflux disease)   . Hyperlipidemia   . Hypertension    PCP- Kathlynn Grate, phone; [phone]  . PAT (paroxysmal atrial tachycardia) ([place])    a. 02/2015 s/p DCCV;  b. 12/2016 recurrent PAT - unchanged with escalating BB dose (rates 106-107).  Marland Kitchen PSVT (paroxysmal supraventricular tachycardia) ([place])    a. 10/2011 Admitted to [facility] w/ SVT-->broke with IV dilt.  . Recurrent Nephrolithiasis   . Sleep apnea    [facility]- in process of being evaluated now, states 12 yrs. ago was on CPAP but after having his deviated septum repaired, he had put the CPAP in storage. Pt. will get a new machine soon.   . Thoracic aortic aneurysm ([place])    a. 01/2016 CTA chest: 4.0 cm Asc Ao; b. 01/2017 Echo: nl Ao root size.  . Type II diabetes mellitus ([place])   . Umbilical hernia    "not repaired" (01/16/2013)  . VSD (ventricular septal defect) w/ persistent systolic murmur    a. 12/6158 s/p VSD closure (dacron patch);  b. 01/2017 Echo: EF 50-55%, no rwma,  mild AS, mild MR, mod dil LA, dilated RV, sev dil RA, mild TR. PASP nl.    ROS:   All systems reviewed and negative except as noted in the HPI.   Past Surgical History:  Procedure Laterality Date  . CARDIAC CATHETERIZATION  > 5 yr ago   Done at [facility], reportedly clean  . CARDIAC CATHETERIZATION  11/2012  . CARDIOVERSION N/A 02/12/2015   Procedure: CARDIOVERSION;  Surgeon: Thayer Headings, MD;  Location: [facility] ENDOSCOPY;  Service: Cardiovascular;  Laterality: N/A;  . CARDIOVERSION N/A 04/27/2017   Procedure: CARDIOVERSION;  Surgeon: Evans Lance, MD;  Location: [place] CV LAB;  Service: Cardiovascular;  Laterality: N/A;  . CLIPPING OF ATRIAL APPENDAGE N/A 01/07/2013   Procedure: CLIPPING OF ATRIAL APPENDAGE;  Surgeon: Grace Isaac, MD;  Location: [place];   Service: Open Heart Surgery;  Laterality: N/A;  . CORONARY ARTERY BYPASS GRAFT N/A 01/07/2013   Procedure: CORONARY ARTERY BYPASS GRAFTING (CABG);  Surgeon: Grace Isaac, MD;  Location: [place];  Service: Open Heart Surgery;  Laterality: N/A;  Times1 using endoscopically harvested saphenous vein graft to the PDA  . EYE SURGERY Left 1990's   "clipped muscle so it wouldn't be pulling up" (01/16/2013)  . INTRAOPERATIVE TRANSESOPHAGEAL ECHOCARDIOGRAM N/A 01/07/2013   Procedure: INTRAOPERATIVE TRANSESOPHAGEAL ECHOCARDIOGRAM;  Surgeon: Grace Isaac, MD;  Location: [place];  Service: Open Heart Surgery;  Laterality: N/A;  . LEFT AND RIGHT HEART CATHETERIZATION WITH CORONARY ANGIOGRAM N/A 11/20/2012   Procedure: LEFT AND RIGHT HEART CATHETERIZATION WITH CORONARY ANGIOGRAM;  Surgeon: Jolaine Artist, MD;  Location: [facility] CATH LAB;  Service: Cardiovascular;  Laterality: N/A;  . MAZE N/A 01/07/2013   Procedure: MAZE;  Surgeon: Grace Isaac, MD;  Location: [place];  Service: Open Heart Surgery;  Laterality: N/A;  . MULTIPLE EXTRACTIONS WITH ALVEOLOPLASTY N/A 12/11/2012   Procedure: Extraction of tooth #'s 5,1,8,8,41,66,06,30,16,01,09,32,35,57,32 wioth alveoloplasty and bialteral fibrous tuberosity reductions.;  Surgeon: Lenn Cal, DDS;  Location: [facility] ORS;  Service: Oral Surgery;  Laterality: N/A;  . NASAL SEPTUM SURGERY  2004  . TEE WITHOUT CARDIOVERSION N/A 11/21/2012   Procedure: TRANSESOPHAGEAL ECHOCARDIOGRAM (TEE);  Surgeon: Jolaine Artist, MD;  Location: [facility] ENDOSCOPY;  Service: Cardiovascular;  Laterality: N/A;  . VSD REPAIR N/A 01/07/2013   Procedure: VENTRICULAR SEPTAL DEFECT (VSD) REPAIR;  Surgeon: Grace Isaac, MD;  Location: [place];  Service: Open Heart Surgery;  Laterality: N/A;     Family History  Problem Relation Age of Onset  . Cancer Mother   . Diabetes Mother      Social History   Socioeconomic History  . Marital status: Married    Spouse name: Not on file  . Number of  children: Not on file  . Years of education: Not on file  . Highest education level: Not on file  Occupational History  . Occupation: Psychologist, educational    Comment: Heavy work at times  Social Needs  . Financial resource strain: Not on file  . Food insecurity:    Worry: Not on file    Inability: Not on file  . Transportation needs:    Medical: Not on file    Non-medical: Not on file  Tobacco Use  . Smoking status: Never Smoker  . Smokeless tobacco: Never Used  Substance and Sexual Activity  . Alcohol use: No  . Drug use: No  . Sexual activity: Not Currently  Lifestyle  . Physical activity:    Days per week: Not on  file    Minutes per session: Not on file  . Stress: Not on file  Relationships  . Social connections:    Talks on phone: Not on file    Gets together: Not on file    Attends religious service: Not on file    Active member of club or organization: Not on file    Attends meetings of clubs or organizations: Not on file    Relationship status: Not on file  . Intimate partner violence:    Fear of current or ex partner: Not on file    Emotionally abused: Not on file    Physically abused: Not on file    Forced sexual activity: Not on file  Other Topics Concern  . Not on file  Social History Narrative   Still works full time. Works around the yard. Lives with wife. Has 2 sisters, neither with cardiac issues.     BP (!) 146/80   Pulse 86   Ht 6' (1.829 m)   Wt 214 lb (97.1 kg)   BMI 29.02 kg/m   Physical Exam:  Well appearing NAD HEENT: Unremarkable Neck:  6 cm JVD, no thyromegally Lymphatics:  No adenopathy Back:  No CVA tenderness Lungs:  Clear with no wheezes HEART:  Regular rate rhythm, 3/6 short blowing systolic murmur at the base, no rubs, no clicks Abd:  soft, positive bowel sounds, no organomegally, no rebound, no guarding Ext:  2 plus pulses, no edema, no cyanosis, no clubbing Skin:  No rashes no nodules Neuro:  CN II through XII intact, motor  grossly intact  EKG - nsr with RBBB, LAFB   Assess/Plan: 1. Persistent atrial fib - he is maintaining NSR on amiodarone. I would anticipate reducing the dose of amiodarone when I see him back if he is still maintaining NSR. 2. CAD - he denies anginal symptoms, s/p CABG 3. Thoracic aneurysm - I will order a CT of the chest as it has been over a year since his last CT and guidelines recommend he have this repeated. 4. MR - we will repeat a 2D echo as his MR sounds a little worse on exam today. Short and loud.  Mikle Bosworth.D.

## 2018-05-16 LAB — BASIC METABOLIC PANEL
BUN/Creatinine Ratio: 12 (ref 10–24)
BUN: 13 mg/dL (ref 8–27)
CHLORIDE: 99 mmol/L (ref 96–106)
CO2: 25 mmol/L (ref 20–29)
Calcium: 9.1 mg/dL (ref 8.6–10.2)
Creatinine, Ser: 1.07 mg/dL (ref 0.76–1.27)
GFR calc non Af Amer: 70 mL/min/{1.73_m2} (ref >59)
GFR, EST AFRICAN AMERICAN: 81 mL/min/{1.73_m2} (ref >59)
Glucose: 96 mg/dL (ref 65–99)
Potassium: 4.3 mmol/L (ref 3.5–5.2)
Sodium: 141 mmol/L (ref 134–144)

## 2018-05-20 ENCOUNTER — Telehealth: Payer: Self-pay | Admitting: Internal Medicine

## 2018-05-20 NOTE — Telephone Encounter (Signed)
     Aaron Jones    Request for surgical clearance:  1. What type of surgery is being performed? colonoscopy  2. When is this surgery scheduled? 05/31/18  3. What type of clearance is required (medical clearance vs. Pharmacy clearance to hold med vs. Both)? both  4. Are there any medications that need to be held prior to surgery and how long? Eliquis  5. Practice name and name of physician performing surgery? Aaron Jones, Aaron Jones  6. What is your office phone number [phone]   7.   What is your office fax number [phone]  8.   Anesthesia type (None, local, MAC, general) ? proposol   Aaron Jones 05/20/2018, 1:25 PM  _________________________________________________________________   (provider comments below)

## 2018-05-23 NOTE — Telephone Encounter (Signed)
Pt takes Eliquis for afib with CHADS2VASc score of 5 (age, CHF, HTN, CAD, DM). Renal function is normal. Ok to hold Eliquis for 1-2 days prior to procedure.

## 2018-05-23 NOTE — Telephone Encounter (Signed)
   Primary Cardiologist: Cristopher Peru, MD  Chart reviewed as part of pre-operative protocol coverage. Aaron Jones was last seen on 05/15/18 by Dr. Lovena Le.  He denied CP but murmur (known MR) more pronounced. Pt ordered to get repeat echo to assess for disease progression. This is not scheduled until 12/31. I will route to Dr. Lovena Le to see if pt should wait until echo is completed before proceeding with colonoscopy.   I will also route to PharmD to weigh in on anticoagulation.  Clearance pending.    Lyda Jester, PA-C 05/23/2018, 3:27 PM

## 2018-05-28 NOTE — Telephone Encounter (Signed)
Reviewed with Dr. Caryl Comes (DOD) - would allow patient to proceed on with colonoscopy without having echo prior.   Will route to requesting provider.   Burtis Junes, RN, [place] [address] [place], [place]  [phone]

## 2018-05-31 ENCOUNTER — Ambulatory Visit
Admission: RE | Admit: 2018-05-31 | Payer: Managed Care, Other (non HMO) | Source: Home / Self Care | Admitting: Unknown Physician Specialty

## 2018-05-31 ENCOUNTER — Encounter: Admission: RE | Payer: Self-pay | Source: Home / Self Care

## 2018-05-31 SURGERY — COLONOSCOPY WITH PROPOFOL
Anesthesia: General

## 2018-06-04 ENCOUNTER — Inpatient Hospital Stay: Admission: RE | Admit: 2018-06-04 | Payer: Self-pay | Source: Ambulatory Visit

## 2018-06-04 ENCOUNTER — Other Ambulatory Visit (HOSPITAL_COMMUNITY): Payer: Self-pay

## 2018-06-07 ENCOUNTER — Other Ambulatory Visit (HOSPITAL_COMMUNITY): Payer: Self-pay

## 2018-06-14 ENCOUNTER — Encounter (INDEPENDENT_AMBULATORY_CARE_PROVIDER_SITE_OTHER): Payer: Self-pay

## 2018-06-14 ENCOUNTER — Ambulatory Visit (HOSPITAL_COMMUNITY): Payer: Managed Care, Other (non HMO) | Attending: Internal Medicine

## 2018-06-14 ENCOUNTER — Other Ambulatory Visit: Payer: Self-pay

## 2018-06-14 ENCOUNTER — Ambulatory Visit (INDEPENDENT_AMBULATORY_CARE_PROVIDER_SITE_OTHER)
Admission: RE | Admit: 2018-06-14 | Discharge: 2018-06-14 | Disposition: A | Discharge status: Home / Self Care | Payer: Managed Care, Other (non HMO) | Source: Ambulatory Visit | Attending: Internal Medicine | Admitting: Internal Medicine

## 2018-06-14 DIAGNOSIS — I712 Thoracic aortic aneurysm, without rupture, unspecified: Secondary | ICD-10-CM

## 2018-06-14 DIAGNOSIS — I340 Nonrheumatic mitral (valve) insufficiency: Secondary | ICD-10-CM | POA: Insufficient documentation

## 2018-06-14 MED ORDER — IOPAMIDOL (ISOVUE-370) INJECTION 76%
100.0000 mL | Freq: Once | INTRAVENOUS | Status: AC | PRN
  Administered 2018-06-14: 100 mL via INTRAVENOUS

## 2018-06-18 ENCOUNTER — Telehealth: Payer: Self-pay

## 2018-06-18 NOTE — Telephone Encounter (Signed)
Pt wife , [place] on [place], advised the pts CT report and they are anxious to hear Dr. Tanna Furry recommendations re: his Echo.. Advised her that I will forward her message to Dr. Lovena Le and will call them back as soon as we here from Dr. Lovena Le. She verbalized understanding and agreed.

## 2018-06-19 NOTE — Telephone Encounter (Signed)
EF is if anything better. His aortic valve has a little narrowing and will need to be followed with another echo in 2 years. His mitral valve is leaking a little but is not severe. Again, echo in 2 years.

## 2018-06-20 NOTE — Telephone Encounter (Signed)
Attempted outreach.  Only number listed on DPR did not go to VM.

## 2018-06-24 ENCOUNTER — Telehealth: Payer: Self-pay

## 2018-06-24 NOTE — Telephone Encounter (Signed)
Attempted outreach.  Only number listed on DPR did not go to VM.

## 2018-06-27 NOTE — Telephone Encounter (Signed)
Results of echo given to Pt.  All questions answered.  Will repeat ECHO q 2 years.

## 2018-07-12 ENCOUNTER — Other Ambulatory Visit: Payer: Self-pay

## 2018-07-12 MED ORDER — ATORVASTATIN CALCIUM 20 MG PO TABS: 20.0000 mg | ORAL_TABLET | Freq: Every day | ORAL | 0 refills | Status: DC

## 2018-07-12 NOTE — Telephone Encounter (Signed)
Refill sent for atorvastatin 20 mg

## 2018-10-08 ENCOUNTER — Other Ambulatory Visit: Payer: Self-pay | Admitting: Cardiovascular Disease

## 2018-10-08 NOTE — Telephone Encounter (Signed)
Refill Request.  

## 2019-01-06 ENCOUNTER — Other Ambulatory Visit: Payer: Self-pay

## 2019-01-06 MED ORDER — ATORVASTATIN CALCIUM 20 MG PO TABS: 20.0000 mg | ORAL_TABLET | Freq: Every day | ORAL | 2 refills | Status: AC

## 2019-02-17 ENCOUNTER — Other Ambulatory Visit (HOSPITAL_COMMUNITY): Payer: Self-pay | Admitting: Neurology

## 2019-02-17 ENCOUNTER — Other Ambulatory Visit: Payer: Self-pay | Admitting: Neurology

## 2019-02-17 DIAGNOSIS — R413 Other amnesia: Secondary | ICD-10-CM

## 2019-02-27 ENCOUNTER — Ambulatory Visit (HOSPITAL_COMMUNITY): Admission: RE | Admit: 2019-02-27 | Payer: Medicare Other | Source: Ambulatory Visit

## 2019-03-10 ENCOUNTER — Ambulatory Visit: Payer: Medicare Other | Attending: Neurology | Admitting: Speech Pathology

## 2019-03-10 ENCOUNTER — Other Ambulatory Visit: Payer: Self-pay

## 2019-03-10 ENCOUNTER — Encounter: Payer: Self-pay | Admitting: Speech Pathology

## 2019-03-10 DIAGNOSIS — R41841 Cognitive communication deficit: Secondary | ICD-10-CM

## 2019-03-10 NOTE — Therapy (Signed)
[place] MAIN [facility] SERVICES [address] [place], [place], [address] Phone: [phone]   Fax:  [phone]  Speech Language Pathology Evaluation  Patient Details  Name: Aaron Jones MRN: [id number] Date of Birth: [birth year] No data recorded  Encounter Date: 03/10/2019  End of Session - 03/10/19 1629    Visit Number  1    Number of Visits  63    SLP Start Time  1504    SLP Stop Time   [id number]    SLP Time Calculation (min)  63 min    Activity Tolerance  Patient tolerated treatment well       Past Medical History:  Diagnosis Date  . Atrial fibrillation with rapid ventricular response ([place]) 01/16/2013   a. 01/2013 s/p R & L Maze and LAA clipping in setting of VSD repair;  b. Prev on amiodarone;  c. CHA2DS2VASc = 4-->Apixaban;  c. 01/2013 Recurrent afib/flutter requiring DCCV.  Marland Kitchen COPD (chronic obstructive pulmonary disease) ([place])   . Coronary artery disease    a. 01/2013 s/p CABG x 1 (VG->PDA) in setting of VSD repair.  Marland Kitchen GERD (gastroesophageal reflux disease)   . Hyperlipidemia   . Hypertension    PCP- Kathlynn Grate, phone; [phone]  . PAT (paroxysmal atrial tachycardia) ([place])    a. 02/2015 s/p DCCV;  b. 12/2016 recurrent PAT - unchanged with escalating BB dose (rates 106-107).  Marland Kitchen PSVT (paroxysmal supraventricular tachycardia) ([place])    a. 10/2011 Admitted to [facility] w/ SVT-->broke with IV dilt.  . Recurrent Nephrolithiasis   . Sleep apnea    [facility]- in process of being evaluated now, states 12 yrs. ago was on CPAP but after having his deviated septum repaired, he had put the CPAP in storage. Pt. will get a new machine soon.   . Thoracic aortic aneurysm ([place])    a. 01/2016 CTA chest: 4.0 cm Asc Ao; b. 01/2017 Echo: nl Ao root size.  . Type II diabetes mellitus ([place])   . Umbilical hernia    "not repaired" (01/16/2013)  . VSD (ventricular septal defect) w/ persistent systolic murmur    a. [id number] s/p VSD closure (dacron patch);  b. 01/2017  Echo: EF 50-55%, no rwma, mild AS, mild MR, mod dil LA, dilated RV, sev dil RA, mild TR. PASP nl.    Past Surgical History:  Procedure Laterality Date  . CARDIAC CATHETERIZATION  > 5 yr ago   Done at [facility], reportedly clean  . CARDIAC CATHETERIZATION  11/2012  . CARDIOVERSION N/A 02/12/2015   Procedure: CARDIOVERSION;  Surgeon: Thayer Headings, MD;  Location: [facility] ENDOSCOPY;  Service: Cardiovascular;  Laterality: N/A;  . CARDIOVERSION N/A 04/27/2017   Procedure: CARDIOVERSION;  Surgeon: Evans Lance, MD;  Location: [place] CV LAB;  Service: Cardiovascular;  Laterality: N/A;  . CLIPPING OF ATRIAL APPENDAGE N/A 01/07/2013   Procedure: CLIPPING OF ATRIAL APPENDAGE;  Surgeon: Grace Isaac, MD;  Location: [place];  Service: Open Heart Surgery;  Laterality: N/A;  . CORONARY ARTERY BYPASS GRAFT N/A 01/07/2013   Procedure: CORONARY ARTERY BYPASS GRAFTING (CABG);  Surgeon: Grace Isaac, MD;  Location: [place];  Service: Open Heart Surgery;  Laterality: N/A;  Times1 using endoscopically harvested saphenous vein graft to the PDA  . EYE SURGERY Left 1990's   "clipped muscle so it wouldn't be pulling up" (01/16/2013)  . INTRAOPERATIVE TRANSESOPHAGEAL ECHOCARDIOGRAM N/A 01/07/2013   Procedure: INTRAOPERATIVE TRANSESOPHAGEAL ECHOCARDIOGRAM;  Surgeon: Grace Isaac, MD;  Location: [facility]  OR;  Service: Open Heart Surgery;  Laterality: N/A;  . LEFT AND RIGHT HEART CATHETERIZATION WITH CORONARY ANGIOGRAM N/A 11/20/2012   Procedure: LEFT AND RIGHT HEART CATHETERIZATION WITH CORONARY ANGIOGRAM;  Surgeon: Jolaine Artist, MD;  Location: [facility] CATH LAB;  Service: Cardiovascular;  Laterality: N/A;  . MAZE N/A 01/07/2013   Procedure: MAZE;  Surgeon: Grace Isaac, MD;  Location: [place];  Service: Open Heart Surgery;  Laterality: N/A;  . MULTIPLE EXTRACTIONS WITH ALVEOLOPLASTY N/A 12/11/2012   Procedure: Extraction of tooth #'s [id number] wioth alveoloplasty and bialteral fibrous  tuberosity reductions.;  Surgeon: Lenn Cal, DDS;  Location: [facility] ORS;  Service: Oral Surgery;  Laterality: N/A;  . NASAL SEPTUM SURGERY  2004  . TEE WITHOUT CARDIOVERSION N/A 11/21/2012   Procedure: TRANSESOPHAGEAL ECHOCARDIOGRAM (TEE);  Surgeon: Jolaine Artist, MD;  Location: [facility] ENDOSCOPY;  Service: Cardiovascular;  Laterality: N/A;  . VSD REPAIR N/A 01/07/2013   Procedure: VENTRICULAR SEPTAL DEFECT (VSD) REPAIR;  Surgeon: Grace Isaac, MD;  Location: [place];  Service: Open Heart Surgery;  Laterality: N/A;    There were no vitals filed for this visit.  Subjective Assessment - 03/10/19 1507    Currently in Pain?  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    Patient/Family Stated Goal  To stay in good health      General Information   HPI  This 73 y/o male referred by Dr. Manuella Ghazi on 9/14/202 for cognitive training due to gradual onset of memory loss worsening since March 2020. He reported he forgets things often, loses attention on things, and is more quick tempered. He reports he easily gets distracted. He says he continues to fix things around the house but it takes longer than normal. He has a hx of hearing impairment from previous exposure to loud sounds at work. He has a positive family hx of dementia. He states he sometimes forgets where he is going. He also states he is under stress taking care of his wife who has advanced MS and godson. He is here seeking help for his worsening memory.   Mobility Status  WFL      Balance Screen   Has the patient fallen in the past 6 months  No    Has the patient had a decrease in activity level because of a fear of falling?    No    Is the patient reluctant to leave their home because of a fear of falling?   No      Prior Functional Status   Cognitive/Linguistic Baseline  Information not available    Type of Home  House     Lives With  Spouse    Available Support  Other (Comment)   Full time caregiver for both wife and godson, no other family or friends available for help   Vocation  Retired      Associate Professor   Overall Cognitive Status  Impaired/Different from baseline    Area of Impairment  Attention;Memory    Current Attention Level  Focused;Sustained    Memory  Decreased short-term memory  Attention  Selective;Alternating;Divided    Selective Attention  Impaired    Alternating Attention  Impaired    Divided Attention  Impaired    Awareness  Appears intact    Problem Solving  Appears intact      Auditory Comprehension   Overall Auditory Comprehension  Appears within functional limits for tasks assessed      Verbal Expression   Overall Verbal Expression  Appears within functional limits for tasks assessed   except for higher level language skills: alternating divergent naming task   Initiation  No impairment    Level of Generative/Spontaneous Verbalization  Conversation      Oral Motor/Sensory Function   Overall Oral Motor/Sensory Function  Appears within functional limits for tasks assessed      Motor Speech   Overall Motor Speech  Appears within functional limits for tasks assessed    Respiration  Within functional limits    Phonation  Normal    Resonance  Within functional limits      Standardized Assessments   Standardized Assessments   Montreal Cognitive Assessment (MOCA)    Montreal Cognitive Assessment (MOCA)   22/30=mild cogntive impairment       CCAS-Scale (Cerebellar Cognitive Affective Schmahmann Syndrome Scale) Version 1A   Semantic Fluency                   20/26    Pass       Phonemic Fluency                  6/19     Fail                Category Switching                  2/15      Fail Digit Span Forward                 7/8         Pass Digit Span Backward              2/6        Fail Cube (draw)     15/15  Pass                                     Verbal Recall                          13/15     Pass Similarities                               5/8        Pass GO NO-GO                             0/2        Fail Affect                                       6/6        Pass   TOTAL SCORE                      76/120  4 Failed Tests  MOCA, version 8.1   Visuospatial/Executive Functioning:         5/5             Alternating Trail Making: 1/1             Visuoconstruction Skills: 1/1             Draw a clock: 3/3 Naming:                                                          3/3 Attention:                                                       3/6             Forward digit span, 5 digits: 1/1             Reverse digit span, 3 digits: 0/1             Vigilance: 1/1             Serial 7's: 1/3 Language:                                                      2/3             Verbal Fluency: 0/1             Repetition: 2/2 Abstraction:                                                   1/2 Delayed Recall:                                              3/5 Orientation:                                                    5/6   TOTAL:                                                           22/30= Mild cognitive impairment     SLP Education - 03/10/19 1628    Education Details  re: role of SLP in cognitive training, external & internal compensatory strategies to aid short term recall    Person(s) Educated  Patient    Methods  Explanation;Handout;Verbal cues    Comprehension  Verbalized understanding;Need further instruction;Verbal cues required         SLP Long Term Goals - 03/10/19 1631      SLP LONG TERM GOAL #1   Title  Pt will use external memory aids and compensatory strategies to recall details re: routine and recent events  independently.    Time  12    Period  Weeks    Status  New    Target Date  06/10/19      SLP LONG TERM GOAL #2   Title  Pt will complete auditory attention/vigilance/memory tasks with 80% accuracy independently.    Time  12    Period  Weeks    Status  New    Target Date  06/10/19      SLP LONG TERM GOAL #3   Title  After listening to 1-3 sentence paragraph, pt will answer y/n and "wh" questions with 80% accuracy using self written notes for key details if needed.    Time  12    Period  Weeks    Status  New    Target Date  06/10/19      SLP LONG TERM GOAL #4   Title  Pt will perform mod complex selective and divided attention tasks with 80% accuracy independently.    Time  12    Period  Weeks    Status  New    Target Date  06/10/19       Plan - 03/10/19 1629    Clinical Impression Statement  This very pleasant 73 y/o male presents with mild  cognitive commuication deficits c/b short term recall, selective & divided attention, and cognitive flexibility deficits. Strengths include receptive & expressive language, and executive functioning skills WFL. Pt will benefit from skilled SLP tx for restorative and compensatory treatment for memory loss, attention and cognitive flexibility.   Speech Therapy Frequency  1x /week   due to caregiver burden, pt can only attend 1x/week   Duration  --   12 weeks   Treatment/Interventions  SLP instruction and feedback;Functional tasks;Compensatory strategies;Cognitive reorganization;Patient/family education    Potential to Achieve Goals  Good    Potential Considerations  Ability to learn/carryover information;Family/community support;Previous level of function;Cooperation/participation level    SLP Home Exercise Plan  TBD    Consulted and Agree with Plan of Care  Patient       Patient will benefit from skilled therapeutic intervention in order to improve the following deficits and impairments:   Cognitive communication deficit    Problem  List Patient Active Problem List   Diagnosis Date Noted  . Aortic dilatation ([place]) 10/11/2016  . Coronary artery disease involving native coronary artery of native heart without angina pectoris 11/19/2015  . History of atrial fibrillation 11/19/2015  . Controlled type 2 diabetes mellitus without complication, without long-term current use of insulin ([place]) 08/13/2015  . Atrial flutter ([place]) 03/15/2015  . Sinus tachycardia 12/23/2014  . Sleep apnea 11/12/2013  . Paroxysmal supraventricular tachycardia ([place]) 01/10/2013  . Acute diastolic heart failure ([place]) 11/23/2012  . Hypokalemia 11/18/2012  . Edema of left lower extremity 11/18/2012  . Mild difuse Hypokinesis 11/18/2012  . Ventricular septal defect 11/18/2012  . Mild Pulmonary HTN 11/18/2012  . Atrial fibrillation with rapid ventricular response ([place]) 11/17/2012  . Benign essential hypertension 11/17/2012  . Type II or unspecified type diabetes mellitus without mention of complication, not stated as uncontrolled 11/17/2012  . Dyslipidemia 11/17/2012  . Heart murmur 11/17/2012    Aaron Stratmann, MA, CCC-SLP  03/10/2019, 4:49 PM  [place] MAIN [facility] SERVICES [address] [place], [place], [address] Phone: [phone]   Fax:  [phone]  Name: Aaron Jones MRN: [id number] Date of Birth: 1946/04/16

## 2019-03-17 ENCOUNTER — Other Ambulatory Visit: Payer: Self-pay

## 2019-03-17 ENCOUNTER — Ambulatory Visit (HOSPITAL_COMMUNITY)
Admission: RE | Admit: 2019-03-17 | Discharge: 2019-03-17 | Disposition: A | Discharge status: Home / Self Care | Payer: Medicare Other | Source: Ambulatory Visit | Attending: Neurology | Admitting: Neurology

## 2019-03-17 DIAGNOSIS — R413 Other amnesia: Secondary | ICD-10-CM

## 2019-03-24 ENCOUNTER — Ambulatory Visit: Payer: Medicare Other | Admitting: Speech Pathology

## 2019-03-27 ENCOUNTER — Encounter: Payer: Medicare Other | Admitting: Speech Pathology

## 2019-04-07 ENCOUNTER — Encounter: Payer: Medicare Other | Admitting: Speech Pathology

## 2019-05-05 ENCOUNTER — Encounter: Payer: Medicare Other | Admitting: Speech Pathology

## 2019-05-08 ENCOUNTER — Encounter: Payer: Medicare Other | Admitting: Speech Pathology

## 2019-05-12 ENCOUNTER — Encounter: Payer: Medicare Other | Admitting: Speech Pathology

## 2019-05-15 ENCOUNTER — Encounter: Payer: Medicare Other | Admitting: Speech Pathology

## 2019-05-19 ENCOUNTER — Encounter: Payer: Medicare Other | Admitting: Speech Pathology

## 2019-05-22 ENCOUNTER — Encounter: Payer: Medicare Other | Admitting: Speech Pathology

## 2019-07-25 ENCOUNTER — Ambulatory Visit: Payer: Medicare Other | Admitting: Student

## 2019-08-06 ENCOUNTER — Ambulatory Visit: Payer: Medicare Other | Admitting: Student

## 2019-08-18 NOTE — Progress Notes (Signed)
PCP:  Kathlyn [place], MD Primary Cardiologist: Cristopher Peru, MD Electrophysiologist: Dr. Josie Dixon NYSHAUN Jones is a 74 y.o. male with past medical history of CAD s/p CABG, VSD with closure, thoracic aneurysm, and atrial flutter s/p ablation who presents today for routine electrophysiology followup. They are seen for Dr. Lovena Le.   Since last being seen in our clinic, the patient reports doing very well.  He denies symptoms of palpitations, chest pain, shortness of breath, orthopnea, PND, lower extremity edema, claudication, dizziness, presyncope, syncope, bleeding, or neurologic sequela. The patient is tolerating medications without difficulties.   He at times has bendopnea when leaning over, and then subsequent lightheadedness when he stands rapidly from this position. This is not marked or limiting.   The patient feels that he is tolerating medications without difficulties and is otherwise without complaint today.   Past Medical History:  Diagnosis Date  . Atrial fibrillation with rapid ventricular response ([place]) 01/16/2013   a. 01/2013 s/p R & L Maze and LAA clipping in setting of VSD repair;  b. Prev on amiodarone;  c. CHA2DS2VASc = 4-->Apixaban;  c. 01/2013 Recurrent afib/flutter requiring DCCV.  Marland Kitchen COPD (chronic obstructive pulmonary disease) ([place])   . Coronary artery disease    a. 01/2013 s/p CABG x 1 (VG->PDA) in setting of VSD repair.  Marland Kitchen GERD (gastroesophageal reflux disease)   . Hyperlipidemia   . Hypertension    PCP- Kathlynn Grate, phone; [phone]  . PAT (paroxysmal atrial tachycardia) ([place])    a. 02/2015 s/p DCCV;  b. 12/2016 recurrent PAT - unchanged with escalating BB dose (rates 106-107).  Marland Kitchen PSVT (paroxysmal supraventricular tachycardia) ([place])    a. 10/2011 Admitted to [facility] w/ SVT-->broke with IV dilt.  . Recurrent Nephrolithiasis   . Sleep apnea    [facility]- in process of being evaluated now, states 12 yrs. ago was on CPAP but after having his deviated septum repaired,  he had put the CPAP in storage. Pt. will get a new machine soon.   . Thoracic aortic aneurysm ([place])    a. 01/2016 CTA chest: 4.0 cm Asc Ao; b. 01/2017 Echo: nl Ao root size.  . Type II diabetes mellitus ([place])   . Umbilical hernia    "not repaired" (01/16/2013)  . VSD (ventricular septal defect) w/ persistent systolic murmur    a. 123XX123 s/p VSD closure (dacron patch);  b. 01/2017 Echo: EF 50-55%, no rwma, mild AS, mild MR, mod dil LA, dilated RV, sev dil RA, mild TR. PASP nl.   Past Surgical History:  Procedure Laterality Date  . CARDIAC CATHETERIZATION  > 5 yr ago   Done at [facility], reportedly clean  . CARDIAC CATHETERIZATION  11/2012  . CARDIOVERSION N/A 02/12/2015   Procedure: CARDIOVERSION;  Surgeon: Thayer Headings, MD;  Location: [facility] ENDOSCOPY;  Service: Cardiovascular;  Laterality: N/A;  . CARDIOVERSION N/A 04/27/2017   Procedure: CARDIOVERSION;  Surgeon: Evans Lance, MD;  Location: [place] CV LAB;  Service: Cardiovascular;  Laterality: N/A;  . CLIPPING OF ATRIAL APPENDAGE N/A 01/07/2013   Procedure: CLIPPING OF ATRIAL APPENDAGE;  Surgeon: Grace Isaac, MD;  Location: [place];  Service: Open Heart Surgery;  Laterality: N/A;  . CORONARY ARTERY BYPASS GRAFT N/A 01/07/2013   Procedure: CORONARY ARTERY BYPASS GRAFTING (CABG);  Surgeon: Grace Isaac, MD;  Location: [place];  Service: Open Heart Surgery;  Laterality: N/A;  Times1 using endoscopically harvested saphenous vein graft to the PDA  . EYE SURGERY Left  1990's   "clipped muscle so it wouldn't be pulling up" (01/16/2013)  . INTRAOPERATIVE TRANSESOPHAGEAL ECHOCARDIOGRAM N/A 01/07/2013   Procedure: INTRAOPERATIVE TRANSESOPHAGEAL ECHOCARDIOGRAM;  Surgeon: Grace Isaac, MD;  Location: [place];  Service: Open Heart Surgery;  Laterality: N/A;  . LEFT AND RIGHT HEART CATHETERIZATION WITH CORONARY ANGIOGRAM N/A 11/20/2012   Procedure: LEFT AND RIGHT HEART CATHETERIZATION WITH CORONARY ANGIOGRAM;  Surgeon: Jolaine Artist, MD;   Location: [facility] CATH LAB;  Service: Cardiovascular;  Laterality: N/A;  . MAZE N/A 01/07/2013   Procedure: MAZE;  Surgeon: Grace Isaac, MD;  Location: [place];  Service: Open Heart Surgery;  Laterality: N/A;  . MULTIPLE EXTRACTIONS WITH ALVEOLOPLASTY N/A 12/11/2012   Procedure: Extraction of tooth #'s [id number] wioth alveoloplasty and bialteral fibrous tuberosity reductions.;  Surgeon: Lenn Cal, DDS;  Location: [facility] ORS;  Service: Oral Surgery;  Laterality: N/A;  . NASAL SEPTUM SURGERY  2004  . TEE WITHOUT CARDIOVERSION N/A 11/21/2012   Procedure: TRANSESOPHAGEAL ECHOCARDIOGRAM (TEE);  Surgeon: Jolaine Artist, MD;  Location: [facility] ENDOSCOPY;  Service: Cardiovascular;  Laterality: N/A;  . VSD REPAIR N/A 01/07/2013   Procedure: VENTRICULAR SEPTAL DEFECT (VSD) REPAIR;  Surgeon: Grace Isaac, MD;  Location: [place];  Service: Open Heart Surgery;  Laterality: N/A;    Current Outpatient Medications  Medication Sig Dispense Refill  . acetaminophen (TYLENOL) 500 MG tablet Take 1,000 mg by mouth daily as needed for moderate pain or headache.    Marland Kitchen amiodarone (PACERONE) 200 MG tablet Take 1 tablet (200 mg total) by mouth daily. 90 tablet 3  . atorvastatin (LIPITOR) 20 MG tablet Take 1 tablet (20 mg total) by mouth at bedtime. 90 tablet 2  . ELIQUIS 5 MG TABS tablet TAKE 1 TABLET BY MOUTH TWICE DAILY 180 tablet 1  . furosemide (LASIX) 40 MG tablet TAKE 1 TABLET BY MOUTH EVERY DAY 30 tablet 1  . glipiZIDE (GLUCOTROL) 10 MG tablet Take 5-10 mg by mouth 2 (two) times daily before a meal. 10mg  in the am and 5mg  in the pm    . Glucose Blood (BLOOD GLUCOSE TEST STRIPS) STRP 1 strip by In Vitro route 2 (two) times daily. 100 each 0  . metFORMIN (GLUCOPHAGE) 1000 MG tablet Take 1,000 mg by mouth 2 (two) times daily.  1  . metoprolol tartrate (LOPRESSOR) 100 MG tablet Take 1 tablet (100 mg total) by mouth 2 (two) times daily. 180 tablet 3  . metoprolol tartrate (LOPRESSOR) 50 MG  tablet Take 50 mg by mouth 2 (two) times daily.    Marland Kitchen omeprazole (PRILOSEC) 40 MG capsule Take 40 mg by mouth daily before breakfast.     . sertraline (ZOLOFT) 50 MG tablet Take 1 tablet (50 mg total) by mouth once daily for 90 days     No current facility-administered medications for this visit.    Allergies  Allergen Reactions  . Biaxin [Clarithromycin] Nausea Only    Social History   Socioeconomic History  . Marital status: Married    Spouse name: Not on file  . Number of children: Not on file  . Years of education: Not on file  . Highest education level: Not on file  Occupational History  . Occupation: Psychologist, educational    Comment: Heavy work at times  Tobacco Use  . Smoking status: Never Smoker  . Smokeless tobacco: Never Used  Substance and Sexual Activity  . Alcohol use: No  . Drug use: No  . Sexual activity: Not Currently  Other Topics  Concern  . Not on file  Social History Narrative   Still works full time. Works around the yard. Lives with wife. Has 2 sisters, neither with cardiac issues.   Social Determinants of Health   Financial Resource Strain:   . Difficulty of Paying Living Expenses:   Food Insecurity:   . Worried About Charity fundraiser in the Last Year:   . Arboriculturist in the Last Year:   Transportation Needs:   . Film/video editor (Medical):   Marland Kitchen Lack of Transportation (Non-Medical):   Physical Activity:   . Days of Exercise per Week:   . Minutes of Exercise per Session:   Stress:   . Feeling of Stress :   Social Connections:   . Frequency of Communication with Friends and Family:   . Frequency of Social Gatherings with Friends and Family:   . Attends Religious Services:   . Active Member of Clubs or Organizations:   . Attends Archivist Meetings:   Marland Kitchen Marital Status:   Intimate Partner Violence:   . Fear of Current or Ex-Partner:   . Emotionally Abused:   Marland Kitchen Physically Abused:   . Sexually Abused:      Review of  Systems: General: No chills, fever, night sweats or weight changes  Cardiovascular:  No chest pain, dyspnea on exertion, edema, orthopnea, palpitations, paroxysmal nocturnal dyspnea Dermatological: No rash, lesions or masses Respiratory: No cough, dyspnea Urologic: No hematuria, dysuria Abdominal: No nausea, vomiting, diarrhea, bright red blood per rectum, melena, or hematemesis Neurologic: No visual changes, weakness, changes in mental status All other systems reviewed and are otherwise negative except as noted above.  Physical Exam: Vitals:   08/19/19 1246  BP: (!) 112/58  Pulse: 83  SpO2: 99%  Weight: 214 lb 9.6 oz (97.3 kg)  Height: 6' (1.829 m)    GEN- The patient is well appearing, alert and oriented x 3 today.   HEENT: normocephalic, atraumatic; sclera clear, conjunctiva pink; hearing intact; oropharynx clear; neck supple, no JVP Lymph- no cervical lymphadenopathy Lungs- Clear to ausculation bilaterally, normal work of breathing.  No wheezes, rales, rhonchi Heart- Regular rate and rhythm, no murmurs, rubs or gallops, PMI not laterally displaced GI- soft, non-tender, non-distended, bowel sounds present, no hepatosplenomegaly Extremities- no clubbing, cyanosis, or edema; DP/PT/radial pulses 2+ bilaterally MS- no significant deformity or atrophy Skin- warm and dry, no rash or lesion Psych- euthymic mood, full affect Neuro- strength and sensation are intact  EKG is ordered. Personal review of EKG from today shows NSR at 83 bpm and RBBB. QTc stable when corrected for QRS  Assessment and Plan:  1. Persistent atrial fib Maintaining NSR on amiodarone Surveillance labs today. Needs yearly eye exams. Continue Eliquis for CHA2DS2VASC of 4    2. CAD s/p CABG Denies anginal symptoms  3. Thoracic aneurysm Stable by CTA 06/2018. Needs repeat anually per 2010 guidelines. Will order.   4. MR Stable by Echo 06/2018. Repeat 2022 (every 2 years)  5. Umbilical Hernia This is soft  but not retractable, and he has been told he will need surgery.  He would be at an at least moderate risk from peri-operative cardiac complications, but would not be prohibitive.  He would be at a high risk of further AF.  Would need to reach out to [place] for final clearance and recommendations on holding Eliquis if procedure is planned.   Doing well overall. Recommend weight loss for central obesity and increased activity as tolerated.  RTC 12 months with Dr. Lovena Le.   Shirley Friar, PA-C  08/19/19 12:56 PM

## 2019-08-19 ENCOUNTER — Ambulatory Visit (INDEPENDENT_AMBULATORY_CARE_PROVIDER_SITE_OTHER): Payer: Medicare Other | Admitting: Student

## 2019-08-19 ENCOUNTER — Encounter: Payer: Self-pay | Admitting: Student

## 2019-08-19 ENCOUNTER — Other Ambulatory Visit: Payer: Self-pay

## 2019-08-19 VITALS — BP 112/58 | HR 83 | Ht 72.0 in | Wt 214.6 lb

## 2019-08-19 DIAGNOSIS — I712 Thoracic aortic aneurysm, without rupture, unspecified: Secondary | ICD-10-CM

## 2019-08-19 DIAGNOSIS — Z79899 Other long term (current) drug therapy: Secondary | ICD-10-CM

## 2019-08-19 DIAGNOSIS — I2510 Atherosclerotic heart disease of native coronary artery without angina pectoris: Secondary | ICD-10-CM

## 2019-08-19 DIAGNOSIS — I4819 Other persistent atrial fibrillation: Secondary | ICD-10-CM | POA: Diagnosis not present

## 2019-08-19 DIAGNOSIS — I4891 Unspecified atrial fibrillation: Secondary | ICD-10-CM | POA: Diagnosis not present

## 2019-08-19 DIAGNOSIS — I10 Essential (primary) hypertension: Secondary | ICD-10-CM

## 2019-08-19 NOTE — Patient Instructions (Addendum)
Medication Instructions:  none *If you need a refill on your cardiac medications before your next appointment, please call your pharmacy*   Lab Work:  TODAY CMET TSH If you have labs (blood work) drawn today and your tests are completely normal, you will receive your results only by: Marland Kitchen MyChart Message (if you have MyChart) OR . A paper copy in the mail If you have any lab test that is abnormal or we need to change your treatment, we will call you to review the results.   Testing/Procedures:  PLEASE SCHEDULE CT ANGIO CHEST AORTA W/CM & OR WO/CM FOR HISTORY OF THORACIC AORTIC ANEURYSM  Follow-Up: At [facility], you and your health needs are our priority.  As part of our continuing mission to provide you with exceptional heart care, we have created designated Provider Care Teams.  These Care Teams include your primary Cardiologist (physician) and Advanced Practice Providers (APPs -  Physician Assistants and Nurse Practitioners) who all work together to provide you with the care you need, when you need it.  We recommend signing up for the patient portal called "MyChart".  Sign up information is provided on this After Visit Summary.  MyChart is used to connect with patients for Virtual Visits (Telemedicine).  Patients are able to view lab/test results, encounter notes, upcoming appointments, etc.  Non-urgent messages can be sent to your provider as well.   To learn more about what you can do with MyChart, go to [email or web address].    Your next appointment:   1 year(s)  The format for your next appointment:   Either In Person or Virtual  Provider:   Dr Lovena Le   Other Instructions

## 2019-08-20 LAB — COMPREHENSIVE METABOLIC PANEL
ALT: 14 IU/L (ref 0–44)
AST: 19 IU/L (ref 0–40)
Albumin/Globulin Ratio: 1.4 (ref 1.2–2.2)
Albumin: 4.2 g/dL (ref 3.7–4.7)
Alkaline Phosphatase: 80 IU/L (ref 39–117)
BUN/Creatinine Ratio: 10 (ref 10–24)
BUN: 12 mg/dL (ref 8–27)
Bilirubin Total: 0.3 mg/dL (ref 0.0–1.2)
CO2: 25 mmol/L (ref 20–29)
Calcium: 9.1 mg/dL (ref 8.6–10.2)
Chloride: 100 mmol/L (ref 96–106)
Creatinine, Ser: 1.22 mg/dL (ref 0.76–1.27)
GFR calc Af Amer: 69 mL/min/{1.73_m2} (ref >59)
GFR calc non Af Amer: 59 mL/min/{1.73_m2} — ABNORMAL LOW (ref >59)
Globulin, Total: 2.9 g/dL (ref 1.5–4.5)
Glucose: 162 mg/dL — ABNORMAL HIGH (ref 65–99)
Potassium: 5.2 mmol/L (ref 3.5–5.2)
Sodium: 140 mmol/L (ref 134–144)
Total Protein: 7.1 g/dL (ref 6.0–8.5)

## 2019-08-20 LAB — TSH: TSH: 2.66 u[IU]/mL (ref 0.450–4.500)

## 2019-09-01 ENCOUNTER — Ambulatory Visit: Admission: RE | Admit: 2019-09-01 | Payer: Medicare Other | Source: Ambulatory Visit

## 2019-10-07 ENCOUNTER — Ambulatory Visit (INDEPENDENT_AMBULATORY_CARE_PROVIDER_SITE_OTHER): Payer: Medicare Other

## 2019-10-07 ENCOUNTER — Other Ambulatory Visit: Payer: Self-pay

## 2019-10-07 ENCOUNTER — Ambulatory Visit (INDEPENDENT_AMBULATORY_CARE_PROVIDER_SITE_OTHER): Payer: Medicare Other | Admitting: Podiatry

## 2019-10-07 DIAGNOSIS — S92351A Displaced fracture of fifth metatarsal bone, right foot, initial encounter for closed fracture: Secondary | ICD-10-CM

## 2019-10-14 NOTE — Progress Notes (Signed)
HPI: 74 y.o. male presenting today as a new patient with a chief complaint of sharp, burning pain to the right fifth digit that began last night secondary to an injury. He states he got up last night walked around the bed and stepped down on the foot incorrectly. He reports associated swelling of the area. Walking increases the pain. He has been using a CAM boot he got from his sister and taking Tylenol for pain. Patient is here for further evaluation and treatment.   Past Medical History:  Diagnosis Date  . Atrial fibrillation with rapid ventricular response ([place]) 01/16/2013   a. 01/2013 s/p R & L Maze and LAA clipping in setting of VSD repair;  b. Prev on amiodarone;  c. CHA2DS2VASc = 4-->Apixaban;  c. 01/2013 Recurrent afib/flutter requiring DCCV.  Marland Kitchen COPD (chronic obstructive pulmonary disease) ([place])   . Coronary artery disease    a. 01/2013 s/p CABG x 1 (VG->PDA) in setting of VSD repair.  Marland Kitchen GERD (gastroesophageal reflux disease)   . Hyperlipidemia   . Hypertension    PCP- Kathlynn Grate, phone; [phone]  . PAT (paroxysmal atrial tachycardia) ([place])    a. 02/2015 s/p DCCV;  b. 12/2016 recurrent PAT - unchanged with escalating BB dose (rates 106-107).  Marland Kitchen PSVT (paroxysmal supraventricular tachycardia) ([place])    a. 10/2011 Admitted to [facility] w/ SVT-->broke with IV dilt.  . Recurrent Nephrolithiasis   . Sleep apnea    [facility]- in process of being evaluated now, states 12 yrs. ago was on CPAP but after having his deviated septum repaired, he had put the CPAP in storage. Pt. will get a new machine soon.   . Thoracic aortic aneurysm ([place])    a. 01/2016 CTA chest: 4.0 cm Asc Ao; b. 01/2017 Echo: nl Ao root size.  . Type II diabetes mellitus ([place])   . Umbilical hernia    "not repaired" (01/16/2013)  . VSD (ventricular septal defect) w/ persistent systolic murmur    a. 123XX123 s/p VSD closure (dacron patch);  b. 01/2017 Echo: EF 50-55%, no rwma, mild AS, mild MR, mod dil LA, dilated RV, sev dil RA,  mild TR. PASP nl.     Physical Exam: General: The patient is alert and oriented x3 in no acute distress.  Dermatology: Skin is warm, dry and supple bilateral lower extremities. Negative for open lesions or macerations.  Vascular: Palpable pedal pulses bilaterally. No edema or erythema noted. Capillary refill within normal limits.  Neurological: Epicritic and protective threshold grossly intact bilaterally.   Musculoskeletal Exam: Range of motion within normal limits to all pedal and ankle joints bilateral. Muscle strength 5/5 in all groups bilateral.   Radiographic Exam:  Nondisplaced fracture of the metaphyseal/diaphyseal junction of the right 5th metatarsal noted.     Assessment: 1. Jones fracture right    Plan of Care:  1. Patient evaluated. X-Rays reviewed.  2. CAM boot dispensed.  3. Ace wrap dispensed.  4. Nonweightbearing on the right foot for 6 weeks using a walker at home.  5. Return to clinic in 4 weeks for follow up X-Ray.      Edrick Kins, DPM [facility]  Dr. Edrick Kins, DPM    [address]                                        [place], [place]  [place] [phone]  Fax [phone]

## 2019-11-05 ENCOUNTER — Other Ambulatory Visit: Payer: Self-pay

## 2019-11-05 ENCOUNTER — Ambulatory Visit
Admission: RE | Admit: 2019-11-05 | Discharge: 2019-11-05 | Disposition: A | Discharge status: Home / Self Care | Payer: Medicare Other | Source: Ambulatory Visit | Attending: Student | Admitting: Student

## 2019-11-05 DIAGNOSIS — I712 Thoracic aortic aneurysm, without rupture, unspecified: Secondary | ICD-10-CM

## 2019-11-05 MED ORDER — IOHEXOL 350 MG/ML SOLN
75.0000 mL | Freq: Once | INTRAVENOUS | Status: AC | PRN
  Administered 2019-11-05: 75 mL via INTRAVENOUS

## 2019-11-06 ENCOUNTER — Telehealth: Payer: Self-pay

## 2019-11-06 NOTE — Telephone Encounter (Signed)
The patient has been notified of the CT result and verbalized understanding.  All questions (if any) were answered. Frederik Schmidt, RN 11/06/2019 12:18 PM

## 2019-11-06 NOTE — Telephone Encounter (Signed)
-----   Message from Shirley Friar, PA-C sent at 11/06/2019 12:13 PM EDT ----- Please let him know this was stable and will continue to be followed annually.  Thank you!

## 2019-11-07 ENCOUNTER — Encounter: Payer: Self-pay | Admitting: Podiatry

## 2019-11-07 ENCOUNTER — Other Ambulatory Visit: Payer: Self-pay

## 2019-11-07 ENCOUNTER — Ambulatory Visit (INDEPENDENT_AMBULATORY_CARE_PROVIDER_SITE_OTHER): Payer: Medicare Other

## 2019-11-07 ENCOUNTER — Ambulatory Visit (INDEPENDENT_AMBULATORY_CARE_PROVIDER_SITE_OTHER): Payer: Medicare Other | Admitting: Podiatry

## 2019-11-07 DIAGNOSIS — S92351A Displaced fracture of fifth metatarsal bone, right foot, initial encounter for closed fracture: Secondary | ICD-10-CM | POA: Diagnosis not present

## 2019-11-09 NOTE — Progress Notes (Signed)
HPI: 74 y.o. male presenting today for follow-up evaluation regarding a fracture to the fifth metatarsal right foot, Jones fracture.  Patient states that he is doing much better.  He only has slight pain at a maximum of 2/10.  Patient's wife states that he will not stay off of the foot and has been working and walking regularly.  He denies significant pain and has not been taking pain meds.   Past Medical History:  Diagnosis Date  . Atrial fibrillation with rapid ventricular response ([place]) 01/16/2013   a. 01/2013 s/p R & L Maze and LAA clipping in setting of VSD repair;  b. Prev on amiodarone;  c. CHA2DS2VASc = 4-->Apixaban;  c. 01/2013 Recurrent afib/flutter requiring DCCV.  Marland Kitchen COPD (chronic obstructive pulmonary disease) ([place])   . Coronary artery disease    a. 01/2013 s/p CABG x 1 (VG->PDA) in setting of VSD repair.  Marland Kitchen GERD (gastroesophageal reflux disease)   . Hyperlipidemia   . Hypertension    PCP- Kathlynn Grate, phone; [phone]  . PAT (paroxysmal atrial tachycardia) ([place])    a. 02/2015 s/p DCCV;  b. 12/2016 recurrent PAT - unchanged with escalating BB dose (rates 106-107).  Marland Kitchen PSVT (paroxysmal supraventricular tachycardia) ([place])    a. 10/2011 Admitted to [facility] w/ SVT-->broke with IV dilt.  . Recurrent Nephrolithiasis   . Sleep apnea    [facility]- in process of being evaluated now, states 12 yrs. ago was on CPAP but after having his deviated septum repaired, he had put the CPAP in storage. Pt. will get a new machine soon.   . Thoracic aortic aneurysm ([place])    a. 01/2016 CTA chest: 4.0 cm Asc Ao; b. 01/2017 Echo: nl Ao root size.  . Type II diabetes mellitus ([place])   . Umbilical hernia    "not repaired" (01/16/2013)  . VSD (ventricular septal defect) w/ persistent systolic murmur    a. 0/1749 s/p VSD closure (dacron patch);  b. 01/2017 Echo: EF 50-55%, no rwma, mild AS, mild MR, mod dil LA, dilated RV, sev dil RA, mild TR. PASP nl.     Physical Exam: General: The patient is alert and  oriented x3 in no acute distress.  Dermatology: Skin is warm, dry and supple bilateral lower extremities. Negative for open lesions or macerations.  Vascular: Palpable pedal pulses bilaterally. No edema or erythema noted. Capillary refill within normal limits.  Neurological: Epicritic and protective threshold grossly intact bilaterally.   Musculoskeletal Exam: Range of motion within normal limits to all pedal and ankle joints bilateral. Muscle strength 5/5 in all groups bilateral.  Minimal tenderness to palpation along the fifth metatarsal  Radiographic Exam:  Nondisplaced fracture of the metaphyseal/diaphyseal junction of the right 5th metatarsal noted with routine healing     Assessment: 1. Jones fracture right    Plan of Care:  1. Patient evaluated. X-Rays reviewed.  It appears that the fracture lines are beginning to fill in with routine healing. 2.  Continue wearing cam boot weightbearing as tolerated 3.  Return to clinic in 6 weeks for final follow-up x-ray   Edrick Kins, DPM [facility]  Dr. Edrick Kins, DPM    [address], [place] [address]  Office [phone]  Fax [phone]

## 2019-11-25 ENCOUNTER — Ambulatory Visit: Payer: Medicare Other | Admitting: Sports Medicine

## 2019-12-19 ENCOUNTER — Ambulatory Visit: Payer: Medicare Other

## 2019-12-19 ENCOUNTER — Ambulatory Visit (INDEPENDENT_AMBULATORY_CARE_PROVIDER_SITE_OTHER): Payer: Medicare Other | Admitting: Podiatry

## 2019-12-19 ENCOUNTER — Other Ambulatory Visit: Payer: Self-pay

## 2019-12-19 DIAGNOSIS — S92351A Displaced fracture of fifth metatarsal bone, right foot, initial encounter for closed fracture: Secondary | ICD-10-CM

## 2019-12-23 ENCOUNTER — Other Ambulatory Visit: Payer: Self-pay

## 2019-12-23 ENCOUNTER — Ambulatory Visit (INDEPENDENT_AMBULATORY_CARE_PROVIDER_SITE_OTHER): Payer: Medicare Other

## 2019-12-23 ENCOUNTER — Ambulatory Visit (INDEPENDENT_AMBULATORY_CARE_PROVIDER_SITE_OTHER): Payer: Medicare Other | Admitting: Podiatry

## 2019-12-23 ENCOUNTER — Encounter: Payer: Self-pay | Admitting: Podiatry

## 2019-12-23 DIAGNOSIS — M7662 Achilles tendinitis, left leg: Secondary | ICD-10-CM

## 2019-12-23 DIAGNOSIS — S92351A Displaced fracture of fifth metatarsal bone, right foot, initial encounter for closed fracture: Secondary | ICD-10-CM

## 2019-12-23 MED ORDER — MELOXICAM 15 MG PO TABS: 15.0000 mg | ORAL_TABLET | Freq: Every day | ORAL | 1 refills | Status: DC

## 2019-12-23 NOTE — Progress Notes (Signed)
Patient presents today for follow-up x-ray for a Jones fracture to the right foot.  Unfortunately x-ray systems were down so the patient states that he would rather just reschedule.  No office visit charge today.

## 2019-12-23 NOTE — Progress Notes (Signed)
HPI: 74 y.o. male presenting today for follow-up evaluation regarding a Jones fracture to the right fifth metatarsal.  Patient is also been complaining of a new complaint of pain to the posterior heel of the left lower extremity.  He was evaluated by another doctor and recommended follow-up.  He has been wearing hightop boots for the pain.  Pain is been ongoing now for a few weeks.  Aggravated by walking and applying pressure.  He denies a history of injury.   Past Medical History:  Diagnosis Date  . Atrial fibrillation with rapid ventricular response ([place]) 01/16/2013   a. 01/2013 s/p R & L Maze and LAA clipping in setting of VSD repair;  b. Prev on amiodarone;  c. CHA2DS2VASc = 4-->Apixaban;  c. 01/2013 Recurrent afib/flutter requiring DCCV.  Marland Kitchen COPD (chronic obstructive pulmonary disease) ([place])   . Coronary artery disease    a. 01/2013 s/p CABG x 1 (VG->PDA) in setting of VSD repair.  Marland Kitchen GERD (gastroesophageal reflux disease)   . Hyperlipidemia   . Hypertension    PCP- Kathlynn Grate, phone; [phone]  . PAT (paroxysmal atrial tachycardia) ([place])    a. 02/2015 s/p DCCV;  b. 12/2016 recurrent PAT - unchanged with escalating BB dose (rates 106-107).  Marland Kitchen PSVT (paroxysmal supraventricular tachycardia) ([place])    a. 10/2011 Admitted to [facility] w/ SVT-->broke with IV dilt.  . Recurrent Nephrolithiasis   . Sleep apnea    [facility]- in process of being evaluated now, states 12 yrs. ago was on CPAP but after having his deviated septum repaired, he had put the CPAP in storage. Pt. will get a new machine soon.   . Thoracic aortic aneurysm ([place])    a. 01/2016 CTA chest: 4.0 cm Asc Ao; b. 01/2017 Echo: nl Ao root size.  . Type II diabetes mellitus ([place])   . Umbilical hernia    "not repaired" (01/16/2013)  . VSD (ventricular septal defect) w/ persistent systolic murmur    a. 09/4008 s/p VSD closure (dacron patch);  b. 01/2017 Echo: EF 50-55%, no rwma, mild AS, mild MR, mod dil LA, dilated RV, sev dil RA, mild TR.  PASP nl.      Physical Exam: General: The patient is alert and oriented x3 in no acute distress.  Dermatology: Skin is warm, dry and supple bilateral lower extremities. Negative for open lesions or macerations.  Vascular: Palpable pedal pulses bilaterally. No edema or erythema noted. Capillary refill within normal limits.  Neurological: Epicritic and protective threshold grossly intact bilaterally.   Musculoskeletal Exam: Pain on palpation noted to the posterior tubercle of the left calcaneus at the insertion of the Achilles tendon consistent with retrocalcaneal bursitis. Range of motion within normal limits. Muscle strength 5/5 in all muscle groups bilateral lower extremities.  Radiographic Exam:  Posterior calcaneal spur noted to the respective calcaneus on lateral view. No fracture or dislocation noted. Normal osseous mineralization noted.     Right fifth metatarsal fracture is mostly unchanged since last visit.  There does appear to be some callus formation throughout the fracture site with opacity and demonstration of some routine healing.  Continues to be in a decent rectus alignment.  Assessment: 1. Insertional Achilles tendinitis left 2.  Fifth metatarsal fracture right foot with routine healing 3.  Diabetes mellitus   Plan of Care:  1. Patient was evaluated. Radiographs were reviewed today. 2. Injection of 0.5 mL Celestone Soluspan injected into the retrocalcaneal bursa. Care was taken to avoid direct injection into the Achilles  tendon. 3.  Prescription for meloxicam 15 mg daily as needed 4.  Continue wearing hightop boots.  Patient states that his hightop lace up boot significantly helped both feet 5.  Return to clinic in 6 weeks for follow-up x-rays right foot Jones fracture   Edrick Kins, DPM [facility]  Dr. Edrick Kins, [address]                                        [place], [place] [address]                Office [phone]   Fax [phone]

## 2020-02-03 ENCOUNTER — Ambulatory Visit: Payer: No Typology Code available for payment source | Admitting: Podiatry

## 2020-02-13 ENCOUNTER — Ambulatory Visit: Payer: Medicare Other | Admitting: Podiatry

## 2020-02-18 ENCOUNTER — Other Ambulatory Visit: Payer: Self-pay | Admitting: Podiatry

## 2020-03-02 ENCOUNTER — Ambulatory Visit (INDEPENDENT_AMBULATORY_CARE_PROVIDER_SITE_OTHER): Payer: Medicare Other

## 2020-03-02 ENCOUNTER — Ambulatory Visit (INDEPENDENT_AMBULATORY_CARE_PROVIDER_SITE_OTHER): Payer: Medicare Other | Admitting: Podiatry

## 2020-03-02 ENCOUNTER — Other Ambulatory Visit: Payer: Self-pay

## 2020-03-02 DIAGNOSIS — R600 Localized edema: Secondary | ICD-10-CM | POA: Diagnosis not present

## 2020-03-02 DIAGNOSIS — M659 Synovitis and tenosynovitis, unspecified: Secondary | ICD-10-CM | POA: Diagnosis not present

## 2020-03-02 DIAGNOSIS — M7662 Achilles tendinitis, left leg: Secondary | ICD-10-CM

## 2020-03-02 DIAGNOSIS — S92351A Displaced fracture of fifth metatarsal bone, right foot, initial encounter for closed fracture: Secondary | ICD-10-CM | POA: Diagnosis not present

## 2020-03-02 NOTE — Progress Notes (Signed)
HPI: 74 y.o. male presenting today for follow-up evaluation regarding a Jones fracture to the right fifth metatarsal as well as left Achilles tendon and ankle pain.  Patient continues to wear hightop boots and shoes.  He states that the right foot metatarsal fracture is feeling well.  It only hurts occasionally.  He has had some chronic swelling with tenderness to the left ankle and Achilles tendon area.  No new complaints at this time  Past Medical History:  Diagnosis Date  . Atrial fibrillation with rapid ventricular response ([place]) 01/16/2013   a. 01/2013 s/p R & L Maze and LAA clipping in setting of VSD repair;  b. Prev on amiodarone;  c. CHA2DS2VASc = 4-->Apixaban;  c. 01/2013 Recurrent afib/flutter requiring DCCV.  Marland Kitchen COPD (chronic obstructive pulmonary disease) ([place])   . Coronary artery disease    a. 01/2013 s/p CABG x 1 (VG->PDA) in setting of VSD repair.  Marland Kitchen GERD (gastroesophageal reflux disease)   . Hyperlipidemia   . Hypertension    PCP- Kathlynn Grate, phone; [phone]  . PAT (paroxysmal atrial tachycardia) ([place])    a. 02/2015 s/p DCCV;  b. 12/2016 recurrent PAT - unchanged with escalating BB dose (rates 106-107).  Marland Kitchen PSVT (paroxysmal supraventricular tachycardia) ([place])    a. 10/2011 Admitted to [facility] w/ SVT-->broke with IV dilt.  . Recurrent Nephrolithiasis   . Sleep apnea    [facility]- in process of being evaluated now, states 12 yrs. ago was on CPAP but after having his deviated septum repaired, he had put the CPAP in storage. Pt. will get a new machine soon.   . Thoracic aortic aneurysm ([place])    a. 01/2016 CTA chest: 4.0 cm Asc Ao; b. 01/2017 Echo: nl Ao root size.  . Type II diabetes mellitus ([place])   . Umbilical hernia    "not repaired" (01/16/2013)  . VSD (ventricular septal defect) w/ persistent systolic murmur    a. 09/4008 s/p VSD closure (dacron patch);  b. 01/2017 Echo: EF 50-55%, no rwma, mild AS, mild MR, mod dil LA, dilated RV, sev dil RA, mild TR. PASP nl.      Physical  Exam: General: The patient is alert and oriented x3 in no acute distress.  Dermatology: Skin is warm, dry and supple bilateral lower extremities. Negative for open lesions or macerations.  Vascular: Palpable pedal pulses bilaterally. No edema or erythema noted. Capillary refill within normal limits.  Neurological: Epicritic and protective threshold grossly intact bilaterally.   Musculoskeletal Exam: Pain on palpation noted to the posterior tubercle of the left calcaneus at the insertion of the Achilles tendon consistent with retrocalcaneal bursitis. Range of motion within normal limits.  There is also some pain on palpation range of motion to the left ankle joint.  Muscle strength 5/5 in all muscle groups bilateral lower extremities.  Radiographic exam: Right fifth metatarsal fracture appears to have improved since last visit.  There does appear to be some callus formation throughout the fracture site with opacity and demonstration of some routine healing.  Continues to be in a decent rectus alignment.  Assessment: 1. Insertional Achilles tendinitis left 2.  Fifth metatarsal fracture right foot with routine healing 3.  Edema left ankle 4.  Capsulitis left ankle   Plan of Care:  1. Patient was evaluated. Radiographs were reviewed today. 2. Injection of 0.5 mL Celestone Soluspan injected into the left ankle joint 3.  Continue meloxicam as needed 4.  Continue wearing hightop boots.  Patient states that his  hightop lace up boot significantly helped both feet 5.  Compression ankle sleeve dispensed for the left ankle for the edema  6.  Return to clinic as needed   Edrick Kins, DPM [facility]  Dr. Edrick Kins, [address]                                        [place], [place] [address]                Office [phone]  Fax [phone]

## 2020-09-01 ENCOUNTER — Telehealth: Payer: Self-pay | Admitting: *Deleted

## 2020-09-01 NOTE — Telephone Encounter (Addendum)
Patient with diagnosis of afib on Eliquis for anticoagulation.    Procedure: colonoscopy and upper endoscopy Date of procedure: TBD  CHA2DS2-VASc Score = 5  This indicates a 7.2% annual risk of stroke. The patient's score is based upon: CHF History: Yes HTN History: Yes Diabetes History: Yes Stroke History: No Vascular Disease History: Yes Age Score: 1 Gender Score: 0   CrCl 83 mL/min Platelet count 264K  Per office protocol, patient can hold Eliquis for 1-2 days prior to procedure. Should not require 3 day hold.  Patient should restart Eliquis on the evening of procedure or day after, at discretion of procedure MD

## 2020-09-01 NOTE — Telephone Encounter (Signed)
   [facility] Pre-operative Risk Assessment    HEARTCARE STAFF: - Please ensure there is not already an duplicate clearance open for this procedure. - Under Visit Info/Reason for Call, type in Other and utilize the format Clearance MM/DD/YY or Clearance TBD. Do not use dashes or single digits. - If request is for dental extraction, please clarify the # of teeth to be extracted.  Request for surgical clearance:  1. What type of surgery is being performed? COLONOSCOPY AND UPPER ENDOSCOPY   2. When is this surgery scheduled? TBD   3. What type of clearance is required (medical clearance vs. Pharmacy clearance to hold med vs. Both)? BOTH  4. Are there any medications that need to be held prior to surgery and how long? ELIQUIS x 3 DAYS PRIOR   5. Practice name and name of physician performing surgery? [facility]; MASON CROLEY, PA-C  6. What is the office phone number? [phone]   7.   What is the office fax number? [phone]  8.   Anesthesia type (None, local, MAC, general) ? MONITORED ANESTHESIA    Julaine Hua 09/01/2020, 2:14 PM  _________________________________________________________________   (provider comments below)

## 2020-09-02 NOTE — Telephone Encounter (Signed)
Pt has appt 4/14 with Oda Kilts, [place]. Will update appt notes to reflect need pre op clearance. Will send FYI to requesting office pt has appt.

## 2020-09-02 NOTE — Telephone Encounter (Signed)
Primary Cardiologist:Gregg Lovena Le, MD  Chart reviewed as part of pre-operative protocol coverage. Because of Aaron Jones's past medical history and time since last visit, he/she will require a follow-up visit in order to better assess preoperative cardiovascular risk.  Pre-op covering staff: - Please schedule appointment and call patient to inform them. - Please contact requesting surgeon's office via preferred method (i.e, phone, fax) to inform them of need for appointment prior to surgery.  If applicable, this message will also be routed to pharmacy pool and/or primary cardiologist for input on holding anticoagulant/antiplatelet agent as requested below so that this information is available at time of patient's appointment.   Deberah Pelton, NP  09/02/2020, 7:15 AM

## 2020-09-16 ENCOUNTER — Ambulatory Visit: Payer: Medicare Other | Admitting: Student

## 2020-09-21 NOTE — Progress Notes (Addendum)
Cardiology Office Note Date:  09/21/2020  Patient ID:  Aaron Jones, Aaron Jones 09-16-1945, MRN [id number] PCP:  Kathlyn Sacramento, MD  Electrophysiologist: Dr. Lovena Le    Chief Complaint:  pre-op clearance, colonoscopy/EGD  History of Present Illness: Aaron Jones is a 75 y.o. male with history of CAD (CABG and VSD closure, MAZE and LAA clipping)AFlutter (ablated), AFib, GERD, HTN, HLD, thoracic aortic aneurysm, DM  He comes in today to be seen for Dr. Lovena Le, last seen by him 2019  More recently he saw A. Nor[place], [place] March 2021, was doing well, no anginal symptoms, maintaining SR on amiodarone, planned for labs.  Discussed possible need for umbilical hernia repair, and likely a moderate CV risk for that procedure, though final recommendations would need to be discussed should he decide to pursue.   RPH has addressed Eliquis 1-2 days hold   TODAY He is accompanied by his wife today They have had a rough couple months, she has been ill and in the hospital, requiring much assistance from the patient, unusual physical assistance to help bathing, etc. And 3-4 weeks ago he was in the yard and twisted his ankle in a hole and suffered an achilles injury was in a cast for a couple weeks, no a walking boot.  He retired a year ago and reports his level of activity has slowed quite a bit without going to work. In the the last 6 months maybe more so the last 58mo has noted a more significant slow down getting winded with activities like taking out the trash to the road, and now with the physical care of his wife has to take breaks (noting she is a morbidly obese women). No CP. A couple months ago he was hauling in the generator and had to stop his heart racing and became quite winded.  No dizzy spells, near syncope or syncope.  No bleeding or signs of bleeding.  He is getting the colonoscopy 2/2 hx of abnormal polyps in the past uncertain why they are doing the EGD.   Past Medical History:   Diagnosis Date  . Atrial fibrillation with rapid ventricular response ([place]) 01/16/2013   a. 01/2013 s/p R & L Maze and LAA clipping in setting of VSD repair;  b. Prev on amiodarone;  c. CHA2DS2VASc = 4-->Apixaban;  c. 01/2013 Recurrent afib/flutter requiring DCCV.  Marland Kitchen COPD (chronic obstructive pulmonary disease) ([place])   . Coronary artery disease    a. 01/2013 s/p CABG x 1 (VG->PDA) in setting of VSD repair.  Marland Kitchen GERD (gastroesophageal reflux disease)   . Hyperlipidemia   . Hypertension    PCP- Kathlynn Grate, phone; [phone]  . PAT (paroxysmal atrial tachycardia) ([place])    a. 02/2015 s/p DCCV;  b. 12/2016 recurrent PAT - unchanged with escalating BB dose (rates 106-107).  Marland Kitchen PSVT (paroxysmal supraventricular tachycardia) ([place])    a. 10/2011 Admitted to [facility] w/ SVT-->broke with IV dilt.  . Recurrent Nephrolithiasis   . Sleep apnea    [facility]- in process of being evaluated now, states 12 yrs. ago was on CPAP but after having his deviated septum repaired, he had put the CPAP in storage. Pt. will get a new machine soon.   . Thoracic aortic aneurysm ([place])    a. 01/2016 CTA chest: 4.0 cm Asc Ao; b. 01/2017 Echo: nl Ao root size.  . Type II diabetes mellitus ([place])   . Umbilical hernia    "not repaired" (01/16/2013)  . VSD (ventricular septal defect)  w/ persistent systolic murmur    a. 12/8467 s/p VSD closure (dacron patch);  b. 01/2017 Echo: EF 50-55%, no rwma, mild AS, mild MR, mod dil LA, dilated RV, sev dil RA, mild TR. PASP nl.    Past Surgical History:  Procedure Laterality Date  . CARDIAC CATHETERIZATION  > 5 yr ago   Done at [facility], reportedly clean  . CARDIAC CATHETERIZATION  11/2012  . CARDIOVERSION N/A 02/12/2015   Procedure: CARDIOVERSION;  Surgeon: Thayer Headings, MD;  Location: [facility] ENDOSCOPY;  Service: Cardiovascular;  Laterality: N/A;  . CARDIOVERSION N/A 04/27/2017   Procedure: CARDIOVERSION;  Surgeon: Evans Lance, MD;  Location: [place] CV LAB;  Service: Cardiovascular;   Laterality: N/A;  . CLIPPING OF ATRIAL APPENDAGE N/A 01/07/2013   Procedure: CLIPPING OF ATRIAL APPENDAGE;  Surgeon: Grace Isaac, MD;  Location: [place];  Service: Open Heart Surgery;  Laterality: N/A;  . CORONARY ARTERY BYPASS GRAFT N/A 01/07/2013   Procedure: CORONARY ARTERY BYPASS GRAFTING (CABG);  Surgeon: Grace Isaac, MD;  Location: [place];  Service: Open Heart Surgery;  Laterality: N/A;  Times1 using endoscopically harvested saphenous vein graft to the PDA  . EYE SURGERY Left 1990's   "clipped muscle so it wouldn't be pulling up" (01/16/2013)  . INTRAOPERATIVE TRANSESOPHAGEAL ECHOCARDIOGRAM N/A 01/07/2013   Procedure: INTRAOPERATIVE TRANSESOPHAGEAL ECHOCARDIOGRAM;  Surgeon: Grace Isaac, MD;  Location: [place];  Service: Open Heart Surgery;  Laterality: N/A;  . LEFT AND RIGHT HEART CATHETERIZATION WITH CORONARY ANGIOGRAM N/A 11/20/2012   Procedure: LEFT AND RIGHT HEART CATHETERIZATION WITH CORONARY ANGIOGRAM;  Surgeon: Jolaine Artist, MD;  Location: [facility] CATH LAB;  Service: Cardiovascular;  Laterality: N/A;  . MAZE N/A 01/07/2013   Procedure: MAZE;  Surgeon: Grace Isaac, MD;  Location: [place];  Service: Open Heart Surgery;  Laterality: N/A;  . MULTIPLE EXTRACTIONS WITH ALVEOLOPLASTY N/A 12/11/2012   Procedure: Extraction of tooth #'s 6,2,9,5,28,41,32,44,01,02,72,53,66,44,03 wioth alveoloplasty and bialteral fibrous tuberosity reductions.;  Surgeon: Lenn Cal, DDS;  Location: [facility] ORS;  Service: Oral Surgery;  Laterality: N/A;  . NASAL SEPTUM SURGERY  2004  . TEE WITHOUT CARDIOVERSION N/A 11/21/2012   Procedure: TRANSESOPHAGEAL ECHOCARDIOGRAM (TEE);  Surgeon: Jolaine Artist, MD;  Location: [facility] ENDOSCOPY;  Service: Cardiovascular;  Laterality: N/A;  . VSD REPAIR N/A 01/07/2013   Procedure: VENTRICULAR SEPTAL DEFECT (VSD) REPAIR;  Surgeon: Grace Isaac, MD;  Location: [place];  Service: Open Heart Surgery;  Laterality: N/A;    Current Outpatient Medications  Medication Sig  Dispense Refill  . acetaminophen (TYLENOL) 500 MG tablet Take 1,000 mg by mouth daily as needed for moderate pain or headache.    Marland Kitchen amiodarone (PACERONE) 200 MG tablet Take 1 tablet (200 mg total) by mouth daily. 90 tablet 3  . atorvastatin (LIPITOR) 20 MG tablet Take 1 tablet (20 mg total) by mouth at bedtime. 90 tablet 2  . ELIQUIS 5 MG TABS tablet TAKE 1 TABLET BY MOUTH TWICE DAILY 180 tablet 1  . furosemide (LASIX) 40 MG tablet TAKE 1 TABLET BY MOUTH EVERY DAY 30 tablet 1  . glipiZIDE (GLUCOTROL) 10 MG tablet Take 5-10 mg by mouth 2 (two) times daily before a meal. 10mg  in the am and 5mg  in the pm    . Glucose Blood (BLOOD GLUCOSE TEST STRIPS) STRP 1 strip by In Vitro route 2 (two) times daily. 100 each 0  . meloxicam (MOBIC) 15 MG tablet TAKE 1 TABLET(15 MG) BY MOUTH DAILY 30 tablet 1  .  metFORMIN (GLUCOPHAGE) 1000 MG tablet Take 1,000 mg by mouth 2 (two) times daily.  1  . metoprolol tartrate (LOPRESSOR) 100 MG tablet Take 1 tablet (100 mg total) by mouth 2 (two) times daily. 180 tablet 3  . metoprolol tartrate (LOPRESSOR) 50 MG tablet Take 50 mg by mouth 2 (two) times daily.    Marland Kitchen omeprazole (PRILOSEC) 40 MG capsule Take 40 mg by mouth daily before breakfast.     . sertraline (ZOLOFT) 50 MG tablet Take 1 tablet (50 mg total) by mouth once daily for 90 days     No current facility-administered medications for this visit.    Allergies:   Biaxin [clarithromycin]   Social History:  The patient  reports that he has never smoked. He has never used smokeless tobacco. He reports that he does not drink alcohol and does not use drugs.   Family History:  The patient's family history includes Cancer in his mother; Diabetes in his mother.  ROS:  Please see the history of present illness.    All other systems are reviewed and otherwise negative.   PHYSICAL EXAM:  VS:  There were no vitals taken for this visit. BMI: There is no height or weight on file to calculate BMI. Well nourished, well  developed, in no acute distress HEENT: normocephalic, atraumatic Neck: no JVD, carotid bruits or masses Cardiac:  RRR; 1-2/6SM, no rubs, or gallops Lungs:  CTA b/l, no wheezing, rhonchi or rales Abd: soft, nontender MS: no deformity or atrophy. LLE in an walking orthopedic boot Ext: trace-1+ edema RLE he reports his basline after an old injury  Skin: warm and dry, no rash Neuro:  No gross deficits appreciated Psych: euthymic mood, full affect    EKG:  Done today and reviewed by myself shows  AFlutter (atypical) 102bpm, RBBB (reviewed with Dr. Curt Bears)   11/05/2019: CT angio chest IMPRESSION: 1. 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 The tubular ascending thoracic aorta is normal in caliber measuring up to 3.5 x 3.5 cm. Recommend annual imaging followup by CTA or MRA. This recommendation follows 2010 ACCF/AHA/AATS/ACR/ASA/SCA/SCAI/SIR/STS/SVM Guidelines for the Diagnosis and Management of Patients with Thoracic Aortic Disease. Circulation. 2010; 121: A355-D322. Aortic aneurysm NOS (ICD10-I71.9) 2. Unchanged enlargement of the proximal descending thoracic aorta, measuring up to 3.1 x 3.0 cm. 3. Cardiomegaly and coronary artery disease. 4. Enlargement of the main pulmonary artery up to 3.8 cm, which can be seen in pulmonary hypertension. 5. Small hiatal hernia.  06/14/2018; TTE Study Conclusions  - Left ventricle: The cavity size was normal. There was moderate  concentric hypertrophy. Systolic function was normal. The  estimated ejection fraction was in the range of 55% to 60%. Wall  motion was normal; there were no regional wall motion  abnormalities. Features are consistent with a pseudonormal left  ventricular filling pattern, with concomitant abnormal relaxation  and increased filling pressure (grade 2 diastolic dysfunction).  - Aortic valve: Valve mobility was restricted. There was mild  stenosis. There was moderate  regurgitation. Peak velocity (S):  233 cm/s. Mean gradient (S): 13 mm Hg. Peak gradient (S): 22 mm  Hg.  - Mitral valve: Transvalvular velocity was within the normal range.  There was no evidence for stenosis. There was moderate  regurgitation.  - Left atrium: The atrium was severely dilated.  - Right ventricle: The cavity size was mildly dilated. Wall  thickness was normal. Systolic function was normal.  - Atrial septum: A patent foramen ovale cannot be  excluded.  - Tricuspid valve: There was mild regurgitation.  - Pulmonary arteries: Systolic pressure was mildly increased. PA  peak pressure: 41 mm Hg (S).   Recent Labs: No results found for requested labs within last 8760 hours.  No results found for requested labs within last 8760 hours.   CrCl cannot be calculated (Patient's most recent lab result is older than the maximum 21 days allowed.).   Wt Readings from Last 3 Encounters:  08/19/19 214 lb 9.6 oz (97.3 kg)  05/15/18 214 lb (97.1 kg)  10/05/17 213 lb 6.4 oz (96.8 kg)     Other studies reviewed: Additional studies/records reviewed today include: summarized above  ASSESSMENT AND PLAN:  1. CAD     On BB statin, no ASA w/eliquis  Suspect his symptoms are the Aflutter  2. Paroxysmal Afib     CHA2DS2Vasc is 5, on Eliquis, appropriatelly dosed      chronically on amiodarone      Atypical AFlutter today, suspect this may be his reduction in exertional capacity He admits to fairly often missing one dose here/there of his Eliquis, just forgets. His colonoscopy/EGD are in June I recommend DCCV and re-assess symptoms Given he has missed some Eliquis and will need uninterrupted eliquis pre-post DCCV we proceed with TEE/DCCV as soon as the schedule allows. We discussed TEE and DCCV procedure, potential risks and benefits, he is agreeable We discussed at length the importance of not missing and doses ofh is Eliquis, they understand and will set up an alarm on his  phone, reminder.   ADDEND 09/24/20 EKG reviewed with Dr. Lovena Le Perhaps an ATach If unclear at time of DCCV, consider adenosine to evaluate underlying  3. Aortic aneurysm     CT due in June     No symptoms  4. HTN     Looks OK  5. HLD      Not addressed today  Disposition: F/u with me ost DCCV to reassess his symptoms.  Current medicines are reviewed at length with the patient today.  The patient did not have any concerns regarding medicines.  Venetia Night, PA-C 09/21/2020 11:19 AM     [address] [place] [address] [phone] (office)  [phone] (fax)

## 2020-09-22 ENCOUNTER — Other Ambulatory Visit: Payer: Self-pay

## 2020-09-22 ENCOUNTER — Ambulatory Visit (INDEPENDENT_AMBULATORY_CARE_PROVIDER_SITE_OTHER): Payer: Medicare Other | Admitting: Physician Assistant

## 2020-09-22 ENCOUNTER — Encounter: Payer: Self-pay | Admitting: Physician Assistant

## 2020-09-22 ENCOUNTER — Encounter: Payer: Self-pay | Admitting: *Deleted

## 2020-09-22 VITALS — BP 130/54 | HR 102 | Ht 72.0 in | Wt 224.8 lb

## 2020-09-22 DIAGNOSIS — I484 Atypical atrial flutter: Secondary | ICD-10-CM | POA: Diagnosis not present

## 2020-09-22 DIAGNOSIS — I2510 Atherosclerotic heart disease of native coronary artery without angina pectoris: Secondary | ICD-10-CM | POA: Diagnosis not present

## 2020-09-22 DIAGNOSIS — I712 Thoracic aortic aneurysm, without rupture, unspecified: Secondary | ICD-10-CM

## 2020-09-22 DIAGNOSIS — I10 Essential (primary) hypertension: Secondary | ICD-10-CM

## 2020-09-22 NOTE — Patient Instructions (Signed)
Medication Instructions:   Your physician recommends that you continue on your current medications as directed. Please refer to the Current Medication list given to you today.   *If you need a refill on your cardiac medications before your next appointment, please call your pharmacy*   Lab Work:  CMET  TSH AND CBC TODAY    If you have labs (blood work) drawn today and your tests are completely normal, you will receive your results only by: Marland Kitchen MyChart Message (if you have MyChart) OR . A paper copy in the mail If you have any lab test that is abnormal or we need to change your treatment, we will call you to review the results.   Testing/Procedures: Your physician has requested that you have a TEE/Cardioversion. During a TEE, sound waves are used to create images of your heart. It provides your doctor with information about the size and shape of your heart and how well your heart's chambers and valves are working. In this test, a transducer is attached to the end of a flexible tube that is guided down you throat and into your esophagus (the tube leading from your mouth to your stomach) to get a more detailed image of your heart. Once the TEE has determined that a blood clot is not present, the cardioversion begins. Electrical Cardioversion uses a jolt of electricity to your heart either through paddles or wired patches attached to your chest. This is a controlled, usually prescheduled, procedure. This procedure is done at the hospital and you are not awake during the procedure. You usually go home the day of the procedure. Please see the instruction sheet given to you today for more information.     Follow-Up: At [facility], you and your health needs are our priority.  As part of our continuing mission to provide you with exceptional heart care, we have created designated Provider Care Teams.  These Care Teams include your primary Cardiologist (physician) and Advanced Practice Providers (APPs  -  Physician Assistants and Nurse Practitioners) who all work together to provide you with the care you need, when you need it.  We recommend signing up for the patient portal called "MyChart".  Sign up information is provided on this After Visit Summary.  MyChart is used to connect with patients for Virtual Visits (Telemedicine).  Patients are able to view lab/test results, encounter notes, upcoming appointments, etc.  Non-urgent messages can be sent to your provider as well.   To learn more about what you can do with MyChart, go to [email or web address].    Your next appointment:  2 WEEKS  POST  TEE/CARDIOVERSION AFTER  10-04-20    The format for your next appointment:   In Person  Provider:   Tommye Standard, PA-C   Other Instructions

## 2020-09-23 LAB — COMPREHENSIVE METABOLIC PANEL
ALT: 7 IU/L (ref 0–44)
AST: 11 IU/L (ref 0–40)
Albumin/Globulin Ratio: 1.2 (ref 1.2–2.2)
Albumin: 3.7 g/dL (ref 3.7–4.7)
Alkaline Phosphatase: 64 IU/L (ref 44–121)
BUN/Creatinine Ratio: 10 (ref 10–24)
BUN: 11 mg/dL (ref 8–27)
Bilirubin Total: 0.6 mg/dL (ref 0.0–1.2)
CO2: 24 mmol/L (ref 20–29)
Calcium: 8.9 mg/dL (ref 8.6–10.2)
Chloride: 100 mmol/L (ref 96–106)
Creatinine, Ser: 1.09 mg/dL (ref 0.76–1.27)
Globulin, Total: 3 g/dL (ref 1.5–4.5)
Glucose: 238 mg/dL — ABNORMAL HIGH (ref 65–99)
Potassium: 4.7 mmol/L (ref 3.5–5.2)
Sodium: 137 mmol/L (ref 134–144)
Total Protein: 6.7 g/dL (ref 6.0–8.5)
eGFR: 72 mL/min/{1.73_m2} (ref >59)

## 2020-09-23 LAB — CBC
Hematocrit: 32.7 % — ABNORMAL LOW (ref 37.5–51.0)
Hemoglobin: 10.2 g/dL — ABNORMAL LOW (ref 13.0–17.7)
MCH: 25.9 pg — ABNORMAL LOW (ref 26.6–33.0)
MCHC: 31.2 g/dL — ABNORMAL LOW (ref 31.5–35.7)
MCV: 83 fL (ref 79–97)
Platelets: 246 10*3/uL (ref 150–450)
RBC: 3.94 x10E6/uL — ABNORMAL LOW (ref 4.14–5.80)
RDW: 14 % (ref 11.6–15.4)
WBC: 5.9 10*3/uL (ref 3.4–10.8)

## 2020-09-23 LAB — TSH: TSH: 0.227 u[IU]/mL — ABNORMAL LOW (ref 0.450–4.500)

## 2020-09-24 ENCOUNTER — Other Ambulatory Visit: Payer: Self-pay | Admitting: *Deleted

## 2020-09-24 DIAGNOSIS — Z79899 Other long term (current) drug therapy: Secondary | ICD-10-CM

## 2020-10-01 ENCOUNTER — Telehealth: Payer: Self-pay

## 2020-10-01 ENCOUNTER — Other Ambulatory Visit (HOSPITAL_COMMUNITY): Payer: Medicare Other

## 2020-10-01 ENCOUNTER — Other Ambulatory Visit: Admission: RE | Admit: 2020-10-01 | Payer: Medicare Other | Source: Ambulatory Visit

## 2020-10-01 NOTE — Telephone Encounter (Signed)
Received a call from patient's sister n law Cecille Rubin calling to cancel patient's TEE scheduled Mon 10/04/20.Stated patient tested positive for covid yesterday.Stated when TEE is rescheduled to call her with appointment at [phone].Stated she would like to be rescheduled 5/16 if possible.She helps patient with all his appointments.  Called central scheduling TEE cancelled for 5/2.She stated when TEE is rescheduled use the same case # [id number].Message sent to Dr.Crenshaw's RN.

## 2020-10-04 ENCOUNTER — Ambulatory Visit (HOSPITAL_COMMUNITY): Admission: RE | Admit: 2020-10-04 | Payer: Medicare Other | Source: Home / Self Care | Admitting: Cardiology

## 2020-10-04 ENCOUNTER — Encounter (HOSPITAL_COMMUNITY): Admission: RE | Payer: Self-pay | Source: Home / Self Care

## 2020-10-04 SURGERY — ECHOCARDIOGRAM, TRANSESOPHAGEAL
Anesthesia: Monitor Anesthesia Care

## 2020-10-04 NOTE — Telephone Encounter (Signed)
Cecille Rubin returning call.

## 2020-10-04 NOTE — Telephone Encounter (Signed)
Left message for pt to call, the TEE DCCV is rescheduled for 10/18/20 @ 10 am with dr Johney Frame.

## 2020-10-04 NOTE — Telephone Encounter (Signed)
Cecille Rubin is calling to reschedule Aaron Jones's TEE, but I was unable to find order in order to do so. She states she is wanting to get him back on the schedule ASAP so he can still have his procedure. Please advise.

## 2020-10-04 NOTE — Telephone Encounter (Signed)
Left message for Aaron Jones to call

## 2020-10-05 NOTE — Telephone Encounter (Signed)
Spoke with pt sister, aware of date and time of procedure. covid testing scheduled at [facility].

## 2020-10-05 NOTE — Telephone Encounter (Signed)
Patient's sister in law, Cecille Rubin, is returning call.

## 2020-10-08 ENCOUNTER — Telehealth: Payer: Self-pay | Admitting: Internal Medicine

## 2020-10-08 NOTE — Telephone Encounter (Signed)
Patient's wife states she is returning a call, but she is unsure who called or what the call was regarding.

## 2020-10-12 ENCOUNTER — Telehealth: Payer: Medicare Other | Admitting: Student

## 2020-10-15 ENCOUNTER — Other Ambulatory Visit: Payer: Self-pay | Admitting: *Deleted

## 2020-10-15 ENCOUNTER — Inpatient Hospital Stay: Admission: RE | Admit: 2020-10-15 | Payer: Medicare Other | Source: Ambulatory Visit

## 2020-10-15 ENCOUNTER — Other Ambulatory Visit: Admission: RE | Admit: 2020-10-15 | Payer: Medicare Other | Source: Ambulatory Visit

## 2020-10-15 DIAGNOSIS — I484 Atypical atrial flutter: Secondary | ICD-10-CM

## 2020-10-18 ENCOUNTER — Ambulatory Visit (HOSPITAL_COMMUNITY): Admission: RE | Admit: 2020-10-18 | Payer: Medicare Other | Source: Ambulatory Visit

## 2020-10-18 ENCOUNTER — Ambulatory Visit: Payer: Medicare Other | Admitting: Physician Assistant

## 2020-10-18 ENCOUNTER — Encounter (HOSPITAL_COMMUNITY)
Admission: RE | Disposition: A | Discharge status: Home / Self Care | Payer: Self-pay | Source: Home / Self Care | Attending: Cardiology

## 2020-10-18 ENCOUNTER — Encounter (HOSPITAL_COMMUNITY): Payer: Self-pay | Admitting: Certified Registered Nurse Anesthetist

## 2020-10-18 ENCOUNTER — Encounter (HOSPITAL_COMMUNITY): Payer: Self-pay | Admitting: Cardiology

## 2020-10-18 ENCOUNTER — Ambulatory Visit (HOSPITAL_COMMUNITY)
Admission: RE | Admit: 2020-10-18 | Discharge: 2020-10-18 | Disposition: A | Discharge status: Home / Self Care | Payer: Medicare Other | Attending: Cardiology | Admitting: Cardiology

## 2020-10-18 DIAGNOSIS — Z538 Procedure and treatment not carried out for other reasons: Secondary | ICD-10-CM | POA: Insufficient documentation

## 2020-10-18 DIAGNOSIS — I4891 Unspecified atrial fibrillation: Secondary | ICD-10-CM | POA: Insufficient documentation

## 2020-10-18 DIAGNOSIS — I480 Paroxysmal atrial fibrillation: Secondary | ICD-10-CM

## 2020-10-18 SURGERY — CANCELLED PROCEDURE

## 2020-10-18 NOTE — Progress Notes (Signed)
Case canceled, patient in sr

## 2020-10-18 NOTE — Progress Notes (Signed)
Patient in NSR on arrival to endoscopy. TEE/DCCV cancelled.  Gwyndolyn Kaufman, MD

## 2020-10-25 ENCOUNTER — Ambulatory Visit (INDEPENDENT_AMBULATORY_CARE_PROVIDER_SITE_OTHER): Payer: Medicare Other | Admitting: Student

## 2020-10-25 ENCOUNTER — Other Ambulatory Visit: Payer: Self-pay

## 2020-10-25 ENCOUNTER — Encounter: Payer: Self-pay | Admitting: Student

## 2020-10-25 VITALS — BP 122/60 | HR 93 | Ht 72.0 in | Wt 214.2 lb

## 2020-10-25 DIAGNOSIS — I10 Essential (primary) hypertension: Secondary | ICD-10-CM | POA: Diagnosis not present

## 2020-10-25 DIAGNOSIS — I2510 Atherosclerotic heart disease of native coronary artery without angina pectoris: Secondary | ICD-10-CM | POA: Diagnosis not present

## 2020-10-25 DIAGNOSIS — I480 Paroxysmal atrial fibrillation: Secondary | ICD-10-CM | POA: Diagnosis not present

## 2020-10-25 DIAGNOSIS — Z79899 Other long term (current) drug therapy: Secondary | ICD-10-CM | POA: Diagnosis not present

## 2020-10-25 MED ORDER — AMIODARONE HCL 200 MG PO TABS: 200.0000 mg | ORAL_TABLET | Freq: Every day | ORAL | 3 refills | Status: DC

## 2020-10-25 MED ORDER — METOPROLOL TARTRATE 50 MG PO TABS: 50.0000 mg | ORAL_TABLET | Freq: Two times a day (BID) | ORAL | 3 refills | Status: DC

## 2020-10-25 NOTE — Progress Notes (Signed)
PCP:  Kathlyn Sacramento, MD Primary Cardiologist: Cristopher Peru, MD Electrophysiologist: None   Aaron Jones is a 75 y.o. male seen today for None for routine electrophysiology followup.  Since last being seen in our clinic the patient reports doing about the same. He presented for [facility] and was felt to be in NSR so this was aborted. EKG appears unchanged from previous with possible atypical AFL/AT. He has mild SOB that quickly passes after helping his wife who is WC bound get dressed or changed. He has mild bendopnea. He sleeps with the [facility] elevated 15-20 degrees chronically. He denies palpitations. He denies chest pain, palpitations, PND, nausea, vomiting, dizziness, syncope, edema, weight gain, or early satiety.  Past Medical History:  Diagnosis Date  . Atrial fibrillation with rapid ventricular response ([place]) 01/16/2013   a. 01/2013 s/p R & L Maze and LAA clipping in setting of VSD repair;  b. Prev on amiodarone;  c. CHA2DS2VASc = 4-->Apixaban;  c. 01/2013 Recurrent afib/flutter requiring DCCV.  Marland Kitchen COPD (chronic obstructive pulmonary disease) ([place])   . Coronary artery disease    a. 01/2013 s/p CABG x 1 (VG->PDA) in setting of VSD repair.  Marland Kitchen GERD (gastroesophageal reflux disease)   . Hyperlipidemia   . Hypertension    PCP- Kathlynn Grate, phone; [phone]  . PAT (paroxysmal atrial tachycardia) ([place])    a. 02/2015 s/p DCCV;  b. 12/2016 recurrent PAT - unchanged with escalating BB dose (rates 106-107).  Marland Kitchen PSVT (paroxysmal supraventricular tachycardia) ([place])    a. 10/2011 Admitted to [facility] w/ SVT-->broke with IV dilt.  . Recurrent Nephrolithiasis   . Sleep apnea    [facility]- in process of being evaluated now, states 12 yrs. ago was on CPAP but after having his deviated septum repaired, he had put the CPAP in storage. Pt. will get a new machine soon.   . Thoracic aortic aneurysm ([place])    a. 01/2016 CTA chest: 4.0 cm Asc Ao; b. 01/2017 Echo: nl Ao root size.  . Type II diabetes mellitus ([place])    . Umbilical hernia    "not repaired" (01/16/2013)  . VSD (ventricular septal defect) w/ persistent systolic murmur    a. 01/997 s/p VSD closure (dacron patch);  b. 01/2017 Echo: EF 50-55%, no rwma, mild AS, mild MR, mod dil LA, dilated RV, sev dil RA, mild TR. PASP nl.   Past Surgical History:  Procedure Laterality Date  . CARDIAC CATHETERIZATION  > 5 yr ago   Done at [facility], reportedly clean  . CARDIAC CATHETERIZATION  11/2012  . CARDIOVERSION N/A 02/12/2015   Procedure: CARDIOVERSION;  Surgeon: Thayer Headings, MD;  Location: [facility] ENDOSCOPY;  Service: Cardiovascular;  Laterality: N/A;  . CARDIOVERSION N/A 04/27/2017   Procedure: CARDIOVERSION;  Surgeon: Evans Lance, MD;  Location: [place] CV LAB;  Service: Cardiovascular;  Laterality: N/A;  . CLIPPING OF ATRIAL APPENDAGE N/A 01/07/2013   Procedure: CLIPPING OF ATRIAL APPENDAGE;  Surgeon: Grace Isaac, MD;  Location: [place];  Service: Open Heart Surgery;  Laterality: N/A;  . CORONARY ARTERY BYPASS GRAFT N/A 01/07/2013   Procedure: CORONARY ARTERY BYPASS GRAFTING (CABG);  Surgeon: Grace Isaac, MD;  Location: [place];  Service: Open Heart Surgery;  Laterality: N/A;  Times1 using endoscopically harvested saphenous vein graft to the PDA  . EYE SURGERY Left 1990's   "clipped muscle so it wouldn't be pulling up" (01/16/2013)  . INTRAOPERATIVE TRANSESOPHAGEAL ECHOCARDIOGRAM N/A 01/07/2013   Procedure: INTRAOPERATIVE TRANSESOPHAGEAL ECHOCARDIOGRAM;  Surgeon: Grace Isaac, MD;  Location: [place];  Service: Open Heart Surgery;  Laterality: N/A;  . LEFT AND RIGHT HEART CATHETERIZATION WITH CORONARY ANGIOGRAM N/A 11/20/2012   Procedure: LEFT AND RIGHT HEART CATHETERIZATION WITH CORONARY ANGIOGRAM;  Surgeon: Jolaine Artist, MD;  Location: [facility] CATH LAB;  Service: Cardiovascular;  Laterality: N/A;  . MAZE N/A 01/07/2013   Procedure: MAZE;  Surgeon: Grace Isaac, MD;  Location: [place];  Service: Open Heart Surgery;  Laterality: N/A;  .  MULTIPLE EXTRACTIONS WITH ALVEOLOPLASTY N/A 12/11/2012   Procedure: Extraction of tooth #'s [id number] wioth alveoloplasty and bialteral fibrous tuberosity reductions.;  Surgeon: Lenn Cal, DDS;  Location: [facility];  Service: Oral Surgery;  Laterality: N/A;  . NASAL SEPTUM SURGERY  2004  . TEE WITHOUT CARDIOVERSION N/A 11/21/2012   Procedure: TRANSESOPHAGEAL ECHOCARDIOGRAM (TEE);  Surgeon: Jolaine Artist, MD;  Location: [facility] ENDOSCOPY;  Service: Cardiovascular;  Laterality: N/A;  . VSD REPAIR N/A 01/07/2013   Procedure: VENTRICULAR SEPTAL DEFECT (VSD) REPAIR;  Surgeon: Grace Isaac, MD;  Location: [place];  Service: Open Heart Surgery;  Laterality: N/A;    Current Outpatient Medications  Medication Sig Dispense Refill  . acetaminophen (TYLENOL) 500 MG tablet Take 1,000 mg by mouth daily as needed for moderate pain or headache.    Marland Kitchen amiodarone (PACERONE) 200 MG tablet Take 200 mg by mouth daily.    Marland Kitchen atorvastatin (LIPITOR) 20 MG tablet Take 1 tablet (20 mg total) by mouth at bedtime. 90 tablet 2  . ELIQUIS 5 MG TABS tablet TAKE 1 TABLET BY MOUTH TWICE DAILY 180 tablet 1  . furosemide (LASIX) 40 MG tablet TAKE 1 TABLET BY MOUTH EVERY DAY 30 tablet 1  . glipiZIDE (GLUCOTROL) 10 MG tablet Take 5-10 mg by mouth See admin instructions. 10mg  in the am and 5mg  in the pm    . Glucose Blood (BLOOD GLUCOSE TEST STRIPS) STRP 1 strip by In Vitro route 2 (two) times daily. 100 each 0  . meloxicam (MOBIC) 15 MG tablet TAKE 1 TABLET(15 MG) BY MOUTH DAILY 30 tablet 1  . metFORMIN (GLUCOPHAGE) 1000 MG tablet Take 1,000 mg by mouth 2 (two) times daily.  1  . metoprolol tartrate (LOPRESSOR) 100 MG tablet Take 1 tablet (100 mg total) by mouth 2 (two) times daily. 180 tablet 3  . metoprolol tartrate (LOPRESSOR) 50 MG tablet Take 50 mg by mouth 2 (two) times daily.    Marland Kitchen omeprazole (PRILOSEC) 40 MG capsule Take 40 mg by mouth daily before breakfast.     . sertraline (ZOLOFT) 50 MG  tablet Take 50 mg by mouth daily.     No current facility-administered medications for this visit.    Allergies  Allergen Reactions  . Biaxin [Clarithromycin] Nausea Only    Social History   Socioeconomic History  . Marital status: Married    Spouse name: Not on file  . Number of children: Not on file  . Years of education: Not on file  . Highest education level: Not on file  Occupational History  . Occupation: Psychologist, educational    Comment: Heavy work at times  Tobacco Use  . Smoking status: Never Smoker  . Smokeless tobacco: Never Used  Vaping Use  . Vaping Use: Never used  Substance and Sexual Activity  . Alcohol use: No  . Drug use: No  . Sexual activity: Not Currently  Other Topics Concern  . Not on file  Social History Narrative   Still works full  time. Works around the yard. Lives with wife. Has 2 sisters, neither with cardiac issues.   Social Determinants of Health   Financial Resource Strain: Not on file  Food Insecurity: Not on file  Transportation Needs: Not on file  Physical Activity: Not on file  Stress: Not on file  Social Connections: Not on file  Intimate Partner Violence: Not on file     Review of Systems: All other systems reviewed and are otherwise negative except as noted above.  Physical Exam: Vitals:   10/25/20 1244  BP: 122/60  Pulse: 93  SpO2: 95%  Weight: 214 lb 3.2 oz (97.2 kg)  Height: 6' (1.829 m)    GEN- The patient is well appearing, alert and oriented x 3 today.   HEENT: normocephalic, atraumatic; sclera clear, conjunctiva pink; hearing intact; oropharynx clear; neck supple, no JVP Lymph- no cervical lymphadenopathy Lungs- Clear to ausculation bilaterally, normal work of breathing.  No wheezes, rales, rhonchi Heart- Regular rate and rhythm, no murmurs, rubs or gallops, PMI not laterally displaced GI- soft, non-tender, non-distended, bowel sounds present, no hepatosplenomegaly Extremities- no clubbing, cyanosis, or edema;  DP/PT/radial pulses 2+ bilaterally MS- no significant deformity or atrophy Skin- warm and dry, no rash or lesion Psych- euthymic mood, full affect Neuro- strength and sensation are intact  EKG is ordered. Personal review of EKG from today shows atypical AFL vs AT at 93 bpm (appears to have also been out of rhythm when presenting for [facility])  Additional studies reviewed include: 11/05/2019: CT angio chest IMPRESSION: 1. 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 The tubular ascending thoracic aorta is normal in caliber measuring up to 3.5 x 3.5 cm. Recommend annual imaging followup by CTA or MRA. This recommendation follows 2010 ACCF/AHA/AATS/ACR/ASA/SCA/SCAI/SIR/STS/SVM Guidelines for the Diagnosis and Management of Patients with Thoracic Aortic Disease. Circulation. 2010; 121: R678-L381. Aortic aneurysm NOS (ICD10-I71.9) 2. Unchanged enlargement of the proximal descending thoracic aorta, measuring up to 3.1 x 3.0 cm. 3. Cardiomegaly and coronary artery disease. 4. Enlargement of the main pulmonary artery up to 3.8 cm, which can be seen in pulmonary hypertension. 5. Small hiatal hernia.  06/14/2018; TTE Study Conclusions  - Left ventricle: The cavity size was normal. There was moderate  concentric hypertrophy. Systolic function was normal. The  estimated ejection fraction was in the range of 55% to 60%. Wall  motion was normal; there were no regional wall motion  abnormalities. Features are consistent with a pseudonormal left  ventricular filling pattern, with concomitant abnormal relaxation  and increased filling pressure (grade 2 diastolic dysfunction).  - Aortic valve: Valve mobility was restricted. There was mild  stenosis. There was moderate regurgitation. Peak velocity (S):  233 cm/s. Mean gradient (S): 13 mm Hg. Peak gradient (S): 22 mm  Hg.  - Mitral valve: Transvalvular velocity was within the normal range.  There was no  evidence for stenosis. There was moderate  regurgitation.  - Left atrium: The atrium was severely dilated.  - Right ventricle: The cavity size was mildly dilated. Wall  thickness was normal. Systolic function was normal.  - Atrial septum: A patent foramen ovale cannot be excluded.  - Tricuspid valve: There was mild regurgitation.  - Pulmonary arteries: Systolic pressure was mildly increased. PA  peak pressure: 41 mm Hg (S).   Assessment and Plan:  1. Paroxysmal AF / AT Was in NSR on presentation for [facility].  EKG today shows same rhythm from previous EKGs at rate of 93 bpm NYHA  IIIa symptoms Continue eliquis for CHA2DS2VASC of at least 5.  He states he has not missed any doses since seeing Renee.  Further confounding is he has been off amiodarone for "months". Will restart at 200 mg daily today, and f/u in 6 weeks   2. Secondary Hypercoagulable State (ICD10:  D68.69)  The patient is at high risk of stroke/thromboembolism with CHA2DS2VASC of at least 5.  Continue eliquis.  3. CAD Denies s/s of ischemia Continue BB and statin No ASA with stable CAD on eliquis  4. HTN Continue lopressor  Resume amiodarone. RTC 6-8 weeks to consider further treatment. His symptoms are overall stable and it's possible he will convert back on amiodarone. Will discuss further with Dr. Lovena Le as well.  Shirley Friar, PA-C  10/25/20 1:00 PM

## 2020-10-25 NOTE — Patient Instructions (Signed)

## 2020-12-07 ENCOUNTER — Ambulatory Visit (INDEPENDENT_AMBULATORY_CARE_PROVIDER_SITE_OTHER): Payer: Medicare Other | Admitting: Student

## 2020-12-07 ENCOUNTER — Encounter: Payer: Self-pay | Admitting: Student

## 2020-12-07 VITALS — BP 128/76 | HR 91 | Ht 72.0 in | Wt 215.0 lb

## 2020-12-07 DIAGNOSIS — I5032 Chronic diastolic (congestive) heart failure: Secondary | ICD-10-CM | POA: Diagnosis not present

## 2020-12-07 DIAGNOSIS — I2510 Atherosclerotic heart disease of native coronary artery without angina pectoris: Secondary | ICD-10-CM

## 2020-12-07 DIAGNOSIS — E039 Hypothyroidism, unspecified: Secondary | ICD-10-CM

## 2020-12-07 DIAGNOSIS — I10 Essential (primary) hypertension: Secondary | ICD-10-CM

## 2020-12-07 DIAGNOSIS — I480 Paroxysmal atrial fibrillation: Secondary | ICD-10-CM

## 2020-12-07 LAB — COMPREHENSIVE METABOLIC PANEL
ALT: 8 IU/L (ref 0–44)
AST: 14 IU/L (ref 0–40)
Albumin/Globulin Ratio: 1.3 (ref 1.2–2.2)
Albumin: 4.1 g/dL (ref 3.7–4.7)
Alkaline Phosphatase: 62 IU/L (ref 44–121)
BUN/Creatinine Ratio: 11 (ref 10–24)
BUN: 14 mg/dL (ref 8–27)
Bilirubin Total: 0.6 mg/dL (ref 0.0–1.2)
CO2: 30 mmol/L — ABNORMAL HIGH (ref 20–29)
Calcium: 9.3 mg/dL (ref 8.6–10.2)
Chloride: 101 mmol/L (ref 96–106)
Creatinine, Ser: 1.27 mg/dL (ref 0.76–1.27)
Globulin, Total: 3.1 g/dL (ref 1.5–4.5)
Glucose: 120 mg/dL — ABNORMAL HIGH (ref 65–99)
Potassium: 4.8 mmol/L (ref 3.5–5.2)
Sodium: 140 mmol/L (ref 134–144)
Total Protein: 7.2 g/dL (ref 6.0–8.5)
eGFR: 60 mL/min/{1.73_m2} (ref >59)

## 2020-12-07 LAB — TSH: TSH: 0.698 u[IU]/mL (ref 0.450–4.500)

## 2020-12-07 LAB — T4, FREE: Free T4: 1.44 ng/dL (ref 0.82–1.77)

## 2020-12-07 LAB — T3, FREE: T3, Free: 2.4 pg/mL (ref 2.0–4.4)

## 2020-12-07 NOTE — Progress Notes (Addendum)
PCP:  Kathlyn [place], MD Primary Cardiologist: Cristopher Peru, MD Electrophysiologist: Cristopher Peru, MD   Aaron Jones is a 75 y.o. male seen today for Cristopher Peru, MD for routine electrophysiology followup.  Since last being seen in our clinic the patient reports doing OK.   He denies any SOB. He has mild fatigue with more than his ADLs. He has mild bendopnea, and occasional lightheadedness with standing up quickly, especially from bending. he denies chest pain, palpitations, dyspnea, PND, orthopnea, nausea, vomiting, syncope, edema, weight gain, or early satiety.  Past Medical History:  Diagnosis Date   Atrial fibrillation with rapid ventricular response ([place]) 01/16/2013   a. 01/2013 s/p R & L Maze and LAA clipping in setting of VSD repair;  b. Prev on amiodarone;  c. CHA2DS2VASc = 4-->Apixaban;  c. 01/2013 Recurrent afib/flutter requiring DCCV.   COPD (chronic obstructive pulmonary disease) ([facility])    Coronary artery disease    a. 01/2013 s/p CABG x 1 (VG->PDA) in setting of VSD repair.   GERD (gastroesophageal reflux disease)    Hyperlipidemia    Hypertension    PCP- [place], phone; [phone]   PAT (paroxysmal atrial tachycardia) ([place])    a. 02/2015 s/p DCCV;  b. 12/2016 recurrent PAT - unchanged with escalating BB dose (rates 106-107).   PSVT (paroxysmal supraventricular tachycardia) ([place])    a. 10/2011 Admitted to [facility] w/ SVT-->broke with IV dilt.   Recurrent Nephrolithiasis    Sleep apnea    [facility]- in process of being evaluated now, states 12 yrs. ago was on CPAP but after having his deviated septum repaired, he had put the CPAP in storage. Pt. will get a new machine soon.    Thoracic aortic aneurysm ([place])    a. 01/2016 CTA chest: 4.0 cm Asc Ao; b. 01/2017 Echo: nl Ao root size.   Type II diabetes mellitus ([place])    Umbilical hernia    "not repaired" (01/16/2013)   VSD (ventricular septal defect) w/ persistent systolic murmur    a. 09/2874 s/p VSD closure (dacron patch);   b. 01/2017 Echo: EF 50-55%, no rwma, mild AS, mild MR, mod dil LA, dilated RV, sev dil RA, mild TR. PASP nl.   Past Surgical History:  Procedure Laterality Date   CARDIAC CATHETERIZATION  > 5 yr ago   60 at [facility], reportedly clean   CARDIAC CATHETERIZATION  11/2012   CARDIOVERSION N/A 02/12/2015   Procedure: CARDIOVERSION;  Surgeon: Thayer Headings, MD;  Location: [place];  Service: Cardiovascular;  Laterality: N/A;   CARDIOVERSION N/A 04/27/2017   Procedure: CARDIOVERSION;  Surgeon: Evans Lance, MD;  Location: [place] CV LAB;  Service: Cardiovascular;  Laterality: N/A;   CLIPPING OF ATRIAL APPENDAGE N/A 01/07/2013   Procedure: CLIPPING OF ATRIAL APPENDAGE;  Surgeon: Grace Isaac, MD;  Location: [place];  Service: Open Heart Surgery;  Laterality: N/A;   CORONARY ARTERY BYPASS GRAFT N/A 01/07/2013   Procedure: CORONARY ARTERY BYPASS GRAFTING (CABG);  Surgeon: Grace Isaac, MD;  Location: [place];  Service: Open Heart Surgery;  Laterality: N/A;  Times1 using endoscopically harvested saphenous vein graft to the PDA   EYE SURGERY Left 1990's   "clipped muscle so it wouldn't be pulling up" (01/16/2013)   INTRAOPERATIVE TRANSESOPHAGEAL ECHOCARDIOGRAM N/A 01/07/2013   Procedure: INTRAOPERATIVE TRANSESOPHAGEAL ECHOCARDIOGRAM;  Surgeon: Grace Isaac, MD;  Location: [place];  Service: Open Heart Surgery;  Laterality: N/A;   LEFT AND RIGHT HEART CATHETERIZATION WITH CORONARY ANGIOGRAM  N/A 11/20/2012   Procedure: LEFT AND RIGHT HEART CATHETERIZATION WITH CORONARY ANGIOGRAM;  Surgeon: Jolaine Artist, MD;  Location: [facility] CATH LAB;  Service: Cardiovascular;  Laterality: N/A;   MAZE N/A 01/07/2013   Procedure: MAZE;  Surgeon: Grace Isaac, MD;  Location: [place];  Service: Open Heart Surgery;  Laterality: N/A;   MULTIPLE EXTRACTIONS WITH ALVEOLOPLASTY N/A 12/11/2012   Procedure: Extraction of tooth #'s 2,6,7,1,24,58,09,98,33,82,50,53,97,67,34 wioth alveoloplasty and bialteral fibrous  tuberosity reductions.;  Surgeon: Lenn Cal, DDS;  Location: [facility];  Service: Oral Surgery;  Laterality: N/A;   NASAL SEPTUM SURGERY  2004   TEE WITHOUT CARDIOVERSION N/A 11/21/2012   Procedure: TRANSESOPHAGEAL ECHOCARDIOGRAM (TEE);  Surgeon: Jolaine Artist, MD;  Location: [facility] ENDOSCOPY;  Service: Cardiovascular;  Laterality: N/A;   VSD REPAIR N/A 01/07/2013   Procedure: VENTRICULAR SEPTAL DEFECT (VSD) REPAIR;  Surgeon: Grace Isaac, MD;  Location: [place];  Service: Open Heart Surgery;  Laterality: N/A;    Current Outpatient Medications  Medication Sig Dispense Refill   acetaminophen (TYLENOL) 500 MG tablet Take 1,000 mg by mouth daily as needed for moderate pain or headache.     amiodarone (PACERONE) 200 MG tablet Take 1 tablet (200 mg total) by mouth daily. 90 tablet 3   atorvastatin (LIPITOR) 20 MG tablet Take 1 tablet (20 mg total) by mouth at bedtime. 90 tablet 2   ELIQUIS 5 MG TABS tablet TAKE 1 TABLET BY MOUTH TWICE DAILY 180 tablet 1   furosemide (LASIX) 40 MG tablet TAKE 1 TABLET BY MOUTH EVERY DAY 30 tablet 1   glipiZIDE (GLUCOTROL) 10 MG tablet Take 5-10 mg by mouth See admin instructions. 10mg  in the am and 5mg  in the pm     Glucose Blood (BLOOD GLUCOSE TEST STRIPS) STRP 1 strip by In Vitro route 2 (two) times daily. 100 each 0   metFORMIN (GLUCOPHAGE) 1000 MG tablet Take 1,000 mg by mouth 2 (two) times daily.  1   metoprolol tartrate (LOPRESSOR) 100 MG tablet Take 1 tablet (100 mg total) by mouth 2 (two) times daily. 180 tablet 3   metoprolol tartrate (LOPRESSOR) 50 MG tablet Take 1 tablet (50 mg total) by mouth 2 (two) times daily. 180 tablet 3   omeprazole (PRILOSEC) 40 MG capsule Take 40 mg by mouth daily before breakfast.      sertraline (ZOLOFT) 50 MG tablet Take 50 mg by mouth daily.     No current facility-administered medications for this visit.    Allergies  Allergen Reactions   Biaxin [Clarithromycin] Nausea Only    Social History   Socioeconomic  History   Marital status: Married    Spouse name: Not on file   Number of children: Not on file   Years of education: Not on file   Highest education level: Not on file  Occupational History   Occupation: Manufacturing    Comment: Heavy work at times  Tobacco Use   Smoking status: Never   Smokeless tobacco: Never  Vaping Use   Vaping Use: Never used  Substance and Sexual Activity   Alcohol use: No   Drug use: No   Sexual activity: Not Currently  Other Topics Concern   Not on file  Social History Narrative   Still works full time. Works around the yard. Lives with wife. Has 2 sisters, neither with cardiac issues.   Social Determinants of Health   Financial Resource Strain: Not on file  Food Insecurity: Not on file  Transportation Needs:  Not on file  Physical Activity: Not on file  Stress: Not on file  Social Connections: Not on file  Intimate Partner Violence: Not on file     Review of Systems: General: No chills, fever, night sweats or weight changes  Cardiovascular:  No chest pain, dyspnea on exertion, edema, orthopnea, palpitations, paroxysmal nocturnal dyspnea Dermatological: No rash, lesions or masses Respiratory: No cough, dyspnea Urologic: No hematuria, dysuria Abdominal: No nausea, vomiting, diarrhea, bright red blood per rectum, melena, or hematemesis Neurologic: No visual changes, weakness, changes in mental status All other systems reviewed and are otherwise negative except as noted above.  Physical Exam: Vitals:   12/07/20 1101  BP: 128/76  Pulse: 91  SpO2: 98%  Weight: 215 lb (97.5 kg)  Height: 6' (1.829 m)    GEN- The patient is well appearing, alert and oriented x 3 today.   HEENT: normocephalic, atraumatic; sclera clear, conjunctiva pink; hearing intact; oropharynx clear; neck supple, no JVP Lymph- no cervical lymphadenopathy Lungs- Clear to ausculation bilaterally, normal work of breathing.  No wheezes, rales, rhonchi Heart- Regular rate and  rhythm, no murmurs, rubs or gallops, PMI not laterally displaced GI- soft, non-tender, non-distended, bowel sounds present, no hepatosplenomegaly Extremities- no clubbing, cyanosis, or edema; DP/PT/radial pulses 2+ bilaterally MS- no significant deformity or atrophy Skin- warm and dry, no rash or lesion Psych- euthymic mood, full affect Neuro- strength and sensation are intact  EKG is ordered. Personal review of EKG from today shows NSR vs atrial tachycardia at 91 bpm  Additional studies reviewed include: Previous EP office notes  Echo 06/2018 LVEF 55-60%  Assessment and Plan:  1. Paroxysmal AF / AT EKG today shows similar appearance to last several, with NSR vs atrial tach vs atypical atrial flutter.   NYHA II-III symptoms.  Continue eliquis for CHA2DS2VASC of at least 5.   Continue amiodarone 200 mg daily.  His symptoms are not clearly corresponding with this, and he has EKGs of a similar appearance as back as far as 2019.  Update Echo.   Continue amiodarone for now, with labs today.  If it is felt this arrhythmia is affecting his symptoms, could consider [place]. He was previously turned away from Endo as he was felt to be in NSR. It may be beneficial to perform [facility] in cath lab with EP staff for further clarification of his arrhyhtmia at that time.     2. Secondary Hypercoagulable State (ICD10:  D68.69)  The patient is at high risk of stroke/thromboembolism with CHA2DS2VASC of at least 5.   Continue eliquis   3. CAD Denies s/s of ischemia Continue BB and statin No ASA with stable CAD on eliquis   4. HTN Continue lopressor 100 mg BID  He feels he is doing OK. He has an EGD/Colon coming up next month. If we attempted to proceed with [facility] prior to that, would need to be pushed back to hold [place]. He would prefer to proceed with EGD/Colon and revisit rhythm after that. Will plan to follow up with Dr. Lovena Le in 6-8 weeks for possible further clarification of his arrhyhtmia.   Shirley Friar, PA-C  12/07/20 11:15 AM

## 2020-12-07 NOTE — Patient Instructions (Signed)
Medication Instructions:  Your physician recommends that you continue on your current medications as directed. Please refer to the Current Medication list given to you today.  *If you need a refill on your cardiac medications before your next appointment, please call your pharmacy*   Lab Work: TODAY: CMET, TSH, Free T3, Free T4  If you have labs (blood work) drawn today and your tests are completely normal, you will receive your results only by: [place] (if you have MyChart) OR A paper copy in the mail If you have any lab test that is abnormal or we need to change your treatment, we will call you to review the results.   Testing/Procedures: Your physician has requested that you have an echocardiogram. Echocardiography is a painless test that uses sound waves to create images of your heart. It provides your doctor with information about the size and shape of your heart and how well your heart's chambers and valves are working. This procedure takes approximately one hour. There are no restrictions for this procedure.   Follow-Up: At [facility], you and your health needs are our priority.  As part of our continuing mission to provide you with exceptional heart care, we have created designated Provider Care Teams.  These Care Teams include your primary Cardiologist (physician) and Advanced Practice Providers (APPs -  Physician Assistants and Nurse Practitioners) who all work together to provide you with the care you need, when you need it.   Your next appointment:   6-8 week(s)  The format for your next appointment:   In Person  Provider:   Cristopher Peru, MD

## 2020-12-22 ENCOUNTER — Telehealth: Payer: Self-pay | Admitting: *Deleted

## 2020-12-22 NOTE — Telephone Encounter (Signed)
   [facility] Pre-operative Risk Assessment    Patient Name: Aaron Jones  DOB: [birth year] MRN: [id number]  HEARTCARE STAFF:  - IMPORTANT!!!!!! Under Visit Info/Reason for Call, type in Other and utilize the format Clearance MM/DD/YY or Clearance TBD. Do not use dashes or single digits. - Please review there is not already an duplicate clearance open for this procedure. - If request is for dental extraction, please clarify the # of teeth to be extracted. - If the patient is currently at the dentist's office, call Pre-Op Callback Staff (MA/nurse) to input urgent request.  - If the patient is not currently in the dentist office, please route to the Pre-Op pool.  Request for surgical clearance:  What type of surgery is being performed? COLONOSCOPY  When is this surgery scheduled? 01/05/21  What type of clearance is required (medical clearance vs. Pharmacy clearance to hold med vs. Both)? BOTH  Are there any medications that need to be held prior to surgery and how long?  [place] name and name of physician performing surgery? [facility]; DR. Madolyn Frieze  What is the office phone number? [phone]   7.   What is the office fax number? [phone]  8.   Anesthesia type (None, local, MAC, general) ? MONITORED ANESTHESIA    Julaine Hua 12/22/2020, 4:50 PM  _________________________________________________________________   (provider comments below)

## 2020-12-24 ENCOUNTER — Other Ambulatory Visit: Payer: Medicare Other

## 2020-12-24 NOTE — Telephone Encounter (Signed)
Patient with diagnosis of afib on Eliquis for anticoagulation.    Procedure: colonoscopy Date of procedure: 01/05/21  CHA2DS2-VASc Score = 5  This indicates a 7.2% annual risk of stroke. The patient's score is based upon: CHF History: Yes HTN History: Yes Diabetes History: Yes Stroke History: No Vascular Disease History: Yes Age Score: 1 Gender Score: 0     CrCl 71 ml/min Platelet count 201  Per office protocol, patient can hold Eliquis for 1-2 days prior to procedure.

## 2020-12-27 NOTE — Telephone Encounter (Signed)
   Primary Cardiologist: Cristopher Peru, MD  Chart reviewed as part of pre-operative protocol coverage. Given past medical history and time since last visit, based on ACC/AHA guidelines, Aaron Jones would be at acceptable risk for the planned procedure without further cardiovascular testing.   Patient with diagnosis of afib on Eliquis for anticoagulation.     Procedure: colonoscopy Date of procedure: 01/05/21   CHA2DS2-VASc Score = 5  This indicates a 7.2% annual risk of stroke. The patient's score is based upon: CHF History: Yes HTN History: Yes Diabetes History: Yes Stroke History: No Vascular Disease History: Yes Age Score: 1 Gender Score: 0     CrCl 71 ml/min Platelet count 201   Per office protocol, patient can hold Eliquis for 1-2 days prior to procedure.  I will route this recommendation to the requesting party via Epic fax function and remove from pre-op pool.  Please call with questions.  Jossie Ng. Dellia Donnelly NP-C    12/27/2020, 9:26 AM [place] Office [phone] Fax [phone]

## 2020-12-28 ENCOUNTER — Ambulatory Visit (HOSPITAL_COMMUNITY): Payer: Medicare Other | Attending: Internal Medicine

## 2020-12-28 ENCOUNTER — Other Ambulatory Visit: Payer: Self-pay

## 2020-12-28 DIAGNOSIS — I5032 Chronic diastolic (congestive) heart failure: Secondary | ICD-10-CM | POA: Diagnosis present

## 2020-12-28 LAB — ECHOCARDIOGRAM COMPLETE
AR max vel: 2.09 cm2
AV Area VTI: 2.18 cm2
AV Area mean vel: 2.03 cm2
AV Mean grad: 8.3 mmHg
AV Peak grad: 14.6 mmHg
Ao pk vel: 1.91 m/s
MV M vel: 5.11 m/s
MV Peak grad: 104.4 mmHg
P 1/2 time: 396 msec
S' Lateral: 4.6 cm

## 2020-12-30 ENCOUNTER — Other Ambulatory Visit: Payer: Self-pay

## 2020-12-30 MED ORDER — METOPROLOL SUCCINATE ER 100 MG PO TB24
100.0000 mg | ORAL_TABLET | Freq: Every day | ORAL | 3 refills | Status: DC
  Filled 2020-12-30: qty 90, 90d supply, fill #0

## 2021-01-04 ENCOUNTER — Encounter: Payer: Self-pay | Admitting: Internal Medicine

## 2021-01-05 ENCOUNTER — Ambulatory Visit: Payer: Medicare Other | Admitting: Anesthesiology

## 2021-01-05 ENCOUNTER — Ambulatory Visit
Admission: RE | Admit: 2021-01-05 | Discharge: 2021-01-05 | Disposition: A | Discharge status: Home / Self Care | Payer: Medicare Other | Attending: Internal Medicine | Admitting: Internal Medicine

## 2021-01-05 ENCOUNTER — Encounter
Admission: RE | Disposition: A | Discharge status: Home / Self Care | Payer: Self-pay | Source: Home / Self Care | Attending: Internal Medicine

## 2021-01-05 DIAGNOSIS — D509 Iron deficiency anemia, unspecified: Secondary | ICD-10-CM | POA: Insufficient documentation

## 2021-01-05 DIAGNOSIS — D123 Benign neoplasm of transverse colon: Secondary | ICD-10-CM | POA: Diagnosis not present

## 2021-01-05 DIAGNOSIS — E119 Type 2 diabetes mellitus without complications: Secondary | ICD-10-CM | POA: Insufficient documentation

## 2021-01-05 DIAGNOSIS — Z79899 Other long term (current) drug therapy: Secondary | ICD-10-CM | POA: Diagnosis not present

## 2021-01-05 DIAGNOSIS — Z951 Presence of aortocoronary bypass graft: Secondary | ICD-10-CM | POA: Insufficient documentation

## 2021-01-05 DIAGNOSIS — Z7901 Long term (current) use of anticoagulants: Secondary | ICD-10-CM | POA: Diagnosis not present

## 2021-01-05 DIAGNOSIS — K640 First degree hemorrhoids: Secondary | ICD-10-CM | POA: Diagnosis not present

## 2021-01-05 DIAGNOSIS — I509 Heart failure, unspecified: Secondary | ICD-10-CM | POA: Insufficient documentation

## 2021-01-05 DIAGNOSIS — I110 Hypertensive heart disease with heart failure: Secondary | ICD-10-CM | POA: Diagnosis not present

## 2021-01-05 DIAGNOSIS — Z791 Long term (current) use of non-steroidal anti-inflammatories (NSAID): Secondary | ICD-10-CM | POA: Insufficient documentation

## 2021-01-05 DIAGNOSIS — Z980 Intestinal bypass and anastomosis status: Secondary | ICD-10-CM | POA: Diagnosis not present

## 2021-01-05 DIAGNOSIS — K222 Esophageal obstruction: Secondary | ICD-10-CM | POA: Diagnosis not present

## 2021-01-05 DIAGNOSIS — Z1211 Encounter for screening for malignant neoplasm of colon: Secondary | ICD-10-CM | POA: Diagnosis present

## 2021-01-05 DIAGNOSIS — Z85038 Personal history of other malignant neoplasm of large intestine: Secondary | ICD-10-CM | POA: Insufficient documentation

## 2021-01-05 HISTORY — PX: ESOPHAGOGASTRODUODENOSCOPY: SHX5428

## 2021-01-05 HISTORY — DX: Rheumatic mitral valve disease, unspecified: I05.9

## 2021-01-05 HISTORY — PX: COLONOSCOPY WITH PROPOFOL: SHX5780

## 2021-01-05 HISTORY — DX: Heart failure, unspecified: I50.9

## 2021-01-05 HISTORY — DX: Polyp of colon: K63.5

## 2021-01-05 HISTORY — DX: Obesity, unspecified: E66.9

## 2021-01-05 HISTORY — DX: Esophageal obstruction: K22.2

## 2021-01-05 LAB — GLUCOSE, CAPILLARY: Glucose-Capillary: 127 mg/dL — ABNORMAL HIGH (ref 70–99)

## 2021-01-05 SURGERY — EGD (ESOPHAGOGASTRODUODENOSCOPY)
Anesthesia: General

## 2021-01-05 MED ORDER — GLYCOPYRROLATE 0.2 MG/ML IJ SOLN
INTRAMUSCULAR | Status: DC | PRN
  Administered 2021-01-05: .2 mg via INTRAVENOUS

## 2021-01-05 MED ORDER — PROPOFOL 10 MG/ML IV BOLUS
INTRAVENOUS | Status: DC | PRN
  Administered 2021-01-05: 70 mg via INTRAVENOUS
  Administered 2021-01-05: 10 mg via INTRAVENOUS
  Administered 2021-01-05: 20 mg via INTRAVENOUS

## 2021-01-05 MED ORDER — LIDOCAINE HCL (CARDIAC) PF 100 MG/5ML IV SOSY
PREFILLED_SYRINGE | INTRAVENOUS | Status: DC | PRN
  Administered 2021-01-05: 100 mg via INTRAVENOUS

## 2021-01-05 MED ORDER — PROPOFOL 500 MG/50ML IV EMUL
INTRAVENOUS | Status: DC | PRN
  Administered 2021-01-05: 145 ug/kg/min via INTRAVENOUS

## 2021-01-05 MED ORDER — SODIUM CHLORIDE 0.9 % IV SOLN
INTRAVENOUS | Status: DC
  Administered 2021-01-05: 20 mL/h via INTRAVENOUS

## 2021-01-05 NOTE — Anesthesia Procedure Notes (Signed)
Procedure Name: General with mask airway Date/Time: 01/05/2021 10:54 AM Performed by: Kelton Pillar, CRNA Pre-anesthesia Checklist: Patient identified, Emergency Drugs available, Suction available and Patient being monitored Patient Re-evaluated:Patient Re-evaluated prior to induction Oxygen Delivery Method: Simple face mask Induction Type: IV induction Placement Confirmation: positive ETCO2 and CO2 detector Dental Injury: Teeth and Oropharynx as per pre-operative assessment

## 2021-01-05 NOTE — Op Note (Signed)
[facility] Gastroenterology Patient Name: Aaron Jones Procedure Date: 01/05/2021 10:36 AM MRN: [id number] Account #: [id number] Date of Birth: 01/30/1946 Admit Type: Outpatient Age: 75 Room: [facility] ENDO ROOM 4 Gender: Male Note Status: Finalized Procedure:             Colonoscopy Indications:           High risk colon cancer surveillance: Personal history                         of colonic polyps, High risk colon cancer                         surveillance: Personal history of colon cancer Providers:             Benay Pike. Alice Reichert MD, MD Referring MD:          Amaryllis Dyke, MD (Referring MD) Medicines:             Propofol per Anesthesia Complications:         No immediate complications. Procedure:             Pre-Anesthesia Assessment:                        - The risks and benefits of the procedure and the                         sedation options and risks were discussed with the                         patient. All questions were answered and informed                         consent was obtained.                        - Patient identification and proposed procedure were                         verified prior to the procedure by the nurse. The                         procedure was verified in the procedure room.                        - ASA Grade Assessment: III - A patient with severe                         systemic disease.                        - After reviewing the risks and benefits, the patient                         was deemed in satisfactory condition to undergo the                         procedure.                        After obtaining informed consent,  the colonoscope was                         passed under direct vision. Throughout the procedure,                         the patient's blood pressure, pulse, and oxygen                         saturations were monitored continuously. The                         Colonoscope was introduced through  the anus and                         advanced to the the cecum, identified by appendiceal                         orifice and ileocecal valve. The colonoscopy was                         performed without difficulty. The patient tolerated                         the procedure well. The quality of the bowel                         preparation was good. The ileocecal valve, appendiceal                         orifice, and rectum were photographed. Findings:      The perianal and digital rectal examinations were normal. Pertinent       negatives include normal sphincter tone and no palpable rectal lesions.      Non-bleeding internal hemorrhoids were found during retroflexion. The       hemorrhoids were Grade I (internal hemorrhoids that do not prolapse).      There was evidence of a prior end-to-end colo-rectal anastomosis in the       proximal rectum. This was patent and was characterized by healthy       appearing mucosa. The anastomosis was traversed.      A 5 mm polyp was found in the transverse colon. The polyp was sessile.       The polyp was removed with a jumbo cold forceps. Resection and retrieval       were complete.      The exam was otherwise without abnormality. Impression:            - Non-bleeding internal hemorrhoids.                        - Patent end-to-end colo-rectal anastomosis,                         characterized by healthy appearing mucosa.                        - One 5 mm polyp in the transverse colon, removed with  a jumbo cold forceps. Resected and retrieved.                        - The examination was otherwise normal. Recommendation:        - Return to physician assistant in 3 months.                        - If duodenal biopsies negative for celiac disease,                         will proceed with obtaining a video capsule endoscopy                         of the small intestine.                        - Resume Eliquis (apixaban) at  prior dose tomorrow.                         Refer to managing physician for further adjustment of                         therapy.                        - If polyp is benign/adenomatous without dysplasia on                         path review, I will advise NO FURTHER COLON CANCER                         SCREENING in the form of hemoccults, FIT testing,                         colonoscopy, etc. due to age/comorbid status of the                         patient.                        - The findings and recommendations were discussed with                         the patient. Procedure Code(s):     --- Professional ---                        [id number], Colonoscopy, flexible; with biopsy, single or                         multiple Diagnosis Code(s):     --- Professional ---                        K64.0, First degree hemorrhoids                        K63.5, Polyp of colon                        Z98.0, Intestinal bypass and anastomosis status  Z85.038, Personal history of other malignant neoplasm                         of large intestine                        Z86.010, Personal history of colonic polyps CPT copyright 2019 American Medical Association. All rights reserved. The codes documented in this report are preliminary and upon coder review may  be revised to meet current compliance requirements. Efrain Sella MD, MD 01/05/2021 11:07:11 AM This report has been signed electronically. Number of Addenda: 0 Note Initiated On: 01/05/2021 10:36 AM Scope Withdrawal Time: 0 hours 4 minutes 56 seconds  Total Procedure Duration: 0 hours 9 minutes 26 seconds  Estimated Blood Loss:  Estimated blood loss: none.      [facility]

## 2021-01-05 NOTE — Transfer of Care (Signed)
Immediate Anesthesia Transfer of Care Note  Patient: Aaron Jones  Procedure(s) Performed: ESOPHAGOGASTRODUODENOSCOPY (EGD) COLONOSCOPY WITH PROPOFOL  Patient Location: Endoscopy Unit  Anesthesia Type:General  Level of Consciousness: drowsy and patient cooperative  Airway & Oxygen Therapy: Patient Spontanous Breathing and Patient connected to face mask oxygen  Post-op Assessment: Report given to RN and Post -op Vital signs reviewed and stable  Post vital signs: Reviewed and stable  Last Vitals:  Vitals Value Taken Time  BP 123/61 01/05/21 1104  Temp    Pulse 87 01/05/21 1105  Resp 15 01/05/21 1105  SpO2 100 % 01/05/21 1105  Vitals shown include unvalidated device data.  Last Pain:  Vitals:   01/05/21 0907  TempSrc: Temporal  PainSc: 0-No pain         Complications: No notable events documented.

## 2021-01-05 NOTE — Op Note (Signed)
[facility] Gastroenterology Patient Name: Aaron Jones Procedure Date: 01/05/2021 10:37 AM MRN: [id number] Account #: [id number] Date of Birth: August 24, 1945 Admit Type: Outpatient Age: 75 Room: [facility] ENDO ROOM 4 Gender: Male Note Status: Finalized Procedure:             Upper GI endoscopy Indications:           Suspected upper gastrointestinal bleeding in patient                         with unexplained iron deficiency anemia Providers:             Benay Pike. Alice Reichert MD, MD Referring MD:          Amaryllis Dyke, MD (Referring MD) Medicines:             Propofol per Anesthesia Complications:         No immediate complications. Procedure:             Pre-Anesthesia Assessment:                        - The risks and benefits of the procedure and the                         sedation options and risks were discussed with the                         patient. All questions were answered and informed                         consent was obtained.                        - Patient identification and proposed procedure were                         verified prior to the procedure by the nurse. The                         procedure was verified in the procedure room.                        - ASA Grade Assessment: III - A patient with severe                         systemic disease.                        - After reviewing the risks and benefits, the patient                         was deemed in satisfactory condition to undergo the                         procedure.                        After obtaining informed consent, the endoscope was                         passed under direct vision.  Throughout the procedure,                         the patient's blood pressure, pulse, and oxygen                         saturations were monitored continuously. The Endoscope                         was introduced through the mouth, and advanced to the                         third part of  duodenum. The upper GI endoscopy was                         accomplished without difficulty. The patient tolerated                         the procedure well. Findings:      One benign-appearing, intrinsic mild stenosis was found at the       gastroesophageal junction. This stenosis measured 1.7 cm (inner       diameter) x less than one cm (in length). The stenosis was traversed.      The entire examined stomach was normal.      The examined duodenum was normal. Biopsies for histology were taken with       a cold forceps for evaluation of celiac disease. Impression:            - Benign-appearing esophageal stenosis.                        - Normal stomach.                        - Normal examined duodenum. Biopsied. Recommendation:        - Await pathology results.                        - Proceed with colonoscopy Procedure Code(s):     --- Professional ---                        [phone], Esophagogastroduodenoscopy, flexible,                         transoral; with biopsy, single or multiple Diagnosis Code(s):     --- Professional ---                        D50.9, Iron deficiency anemia, unspecified                        K22.2, Esophageal obstruction CPT copyright 2019 American Medical Association. All rights reserved. The codes documented in this report are preliminary and upon coder review may  be revised to meet current compliance requirements. Efrain Sella MD, MD 01/05/2021 10:50:45 AM This report has been signed electronically. Number of Addenda: 0 Note Initiated On: 01/05/2021 10:37 AM Estimated Blood Loss:  Estimated blood loss: none.      [facility]

## 2021-01-05 NOTE — Anesthesia Preprocedure Evaluation (Signed)
Anesthesia Evaluation  Patient identified by MRN, date of birth, ID band Patient awake    Reviewed: Allergy & Precautions, H&P , NPO status , Patient's Chart, lab work & pertinent test results, reviewed documented beta blocker date and time   Airway Mallampati: II   Neck ROM: full    Dental  (+) Poor Dentition   Pulmonary neg pulmonary ROS, sleep apnea and Continuous Positive Airway Pressure Ventilation , COPD,    Pulmonary exam normal        Cardiovascular Exercise Tolerance: Poor hypertension, On Medications + CAD and +CHF  Normal cardiovascular exam+ Valvular Problems/Murmurs  Rhythm:regular Rate:Normal     Neuro/Psych negative neurological ROS  negative psych ROS   GI/Hepatic Neg liver ROS, GERD  Medicated,  Endo/Other  negative endocrine ROSdiabetes  Renal/GU Renal disease  negative genitourinary   Musculoskeletal   Abdominal   Peds  Hematology negative hematology ROS (+)   Anesthesia Other Findings Past Medical History: 01/16/2013: Atrial fibrillation with rapid ventricular response ([place])     Comment:  a. 01/2013 s/p R & L Maze and LAA clipping in setting of               VSD repair;  b. Prev on amiodarone;  c. CHA2DS2VASc =               4-->Apixaban;  c. 01/2013 Recurrent afib/flutter requiring              DCCV. No date: CHF (congestive heart failure) (HCC) No date: Colon polyp No date: COPD (chronic obstructive pulmonary disease) ([place]) No date: Coronary artery disease     Comment:  a. 01/2013 s/p CABG x 1 (VG->PDA) in setting of VSD               repair. No date: GERD (gastroesophageal reflux disease) No date: Hyperlipidemia No date: Hypertension     Comment:  PCP- [place], phone; [phone] No date: Mitral valve disorder No date: Obesity No date: PAT (paroxysmal atrial tachycardia) ([place])     Comment:  a. 02/2015 s/p DCCV;  b. 12/2016 recurrent PAT - unchanged              with  escalating BB dose (rates 106-107). No date: PSVT (paroxysmal supraventricular tachycardia) ([place])     Comment:  a. 10/2011 Admitted to [facility] w/ SVT-->broke with IV dilt. No date: Recurrent Nephrolithiasis No date: Schatzki's ring No date: Sleep apnea     Comment:  [facility]- in process of being evaluated now, states 12 yrs.               ago was on CPAP but after having his deviated septum               repaired, he had put the CPAP in storage. Pt. will get a               new machine soon.  No date: Thoracic aortic aneurysm [facility])     Comment:  a. 01/2016 CTA chest: 4.0 cm Asc Ao; b. 01/2017 Echo: nl               Ao root size. No date: Type II diabetes mellitus ([place]) No date: Umbilical hernia     Comment:  "not repaired" (01/16/2013) No date: VSD (ventricular septal defect) w/ persistent systolic murmur     Comment:  a. 01/2013 s/p VSD closure (dacron patch);  b. 01/2017  Echo: EF 50-55%, no rwma, mild AS, mild MR, mod dil LA,               dilated RV, sev dil RA, mild TR. PASP nl. Past Surgical History: > 5 yr ago: CARDIAC CATHETERIZATION     Comment:  Done at [facility], reportedly clean 11/03/2012: CARDIAC CATHETERIZATION 02/12/2015: CARDIOVERSION; N/A     Comment:  Procedure: CARDIOVERSION;  Surgeon: Thayer Headings, MD;              Location: [place];  Service: Cardiovascular;                Laterality: N/A; 04/27/2017: CARDIOVERSION; N/A     Comment:  Procedure: CARDIOVERSION;  Surgeon: Evans Lance, MD;              Location: [place] [facility];  Service: Cardiovascular;                Laterality: N/A; 01/07/2013: CLIPPING OF ATRIAL APPENDAGE; N/A     Comment:  Procedure: CLIPPING OF ATRIAL APPENDAGE;  Surgeon:               Grace Isaac, MD;  Location: [place];  Service: Open               Heart Surgery;  Laterality: N/A; No date: COLONOSCOPY W/ POLYPECTOMY 01/07/2013: CORONARY ARTERY BYPASS GRAFT; N/A     Comment:  Procedure: CORONARY ARTERY BYPASS GRAFTING  (CABG);                Surgeon: Grace Isaac, MD;  Location: [place];                Service: Open Heart Surgery;  Laterality: N/A;  Times1               using endoscopically harvested saphenous vein graft to               the PDA 02/03/1989: EYE SURGERY; Left     Comment:  "clipped muscle so it wouldn't be pulling up"               (01/16/2013) 01/07/2013: INTRAOPERATIVE TRANSESOPHAGEAL ECHOCARDIOGRAM; N/A     Comment:  Procedure: INTRAOPERATIVE TRANSESOPHAGEAL               ECHOCARDIOGRAM;  Surgeon: Grace Isaac, MD;                Location: [place];  Service: Open Heart Surgery;                Laterality: N/A; 11/20/2012: LEFT AND RIGHT HEART CATHETERIZATION WITH CORONARY  ANGIOGRAM; N/A     Comment:  Procedure: LEFT AND RIGHT HEART CATHETERIZATION WITH               CORONARY ANGIOGRAM;  Surgeon: Jolaine Artist, MD;                Location: [facility] CATH LAB;  Service: Cardiovascular;                Laterality: N/A; 01/07/2013: MAZE; N/A     Comment:  Procedure: MAZE;  Surgeon: Grace Isaac, MD;                Location: [place];  Service: Open Heart Surgery;                Laterality: N/A; 12/11/2012: MULTIPLE EXTRACTIONS WITH ALVEOLOPLASTY; N/A     Comment:  Procedure: Extraction of tooth #'s               [id number] wioth               alveoloplasty and bialteral fibrous tuberosity               reductions.;  Surgeon: Lenn Cal, DDS;  Location:              [facility];  Service: Oral Surgery;  Laterality: N/A; 06/05/2002: NASAL SEPTUM SURGERY No date: sepfal deveation repair 11/21/2012: TEE WITHOUT CARDIOVERSION; N/A     Comment:  Procedure: TRANSESOPHAGEAL ECHOCARDIOGRAM (TEE);                Surgeon: Jolaine Artist, MD;  Location: [facility] ENDOSCOPY;              Service: Cardiovascular;  Laterality: N/A; 01/07/2013: VSD REPAIR; N/A     Comment:  Procedure: VENTRICULAR SEPTAL DEFECT (VSD) REPAIR;                Surgeon: Grace Isaac, MD;  Location: [place];                Service: Open Heart Surgery;  Laterality: N/A; No date: VSD REPAIR BMI    Body Mass Index: 29.84 kg/m     Reproductive/Obstetrics negative OB ROS                             Anesthesia Physical Anesthesia Plan  ASA: 4  Anesthesia Plan: General   Post-op Pain Management:    Induction:   PONV Risk Score and Plan:   Airway Management Planned:   Additional Equipment:   Intra-op Plan:   Post-operative Plan:   Informed Consent: I have reviewed the patients History and Physical, chart, labs and discussed the procedure including the risks, benefits and alternatives for the proposed anesthesia with the patient or authorized representative who has indicated his/her understanding and acceptance.     Dental Advisory Given  Plan Discussed with: CRNA  Anesthesia Plan Comments:         Anesthesia Quick Evaluation

## 2021-01-06 ENCOUNTER — Encounter: Payer: Self-pay | Admitting: Internal Medicine

## 2021-01-06 LAB — SURGICAL PATHOLOGY

## 2021-01-06 NOTE — Interval H&P Note (Signed)
History and Physical Interval Note:  01/06/2021 12:38 PM  Aaron Jones  has presented today for surgery, with the diagnosis of IDA, PERS HX.OF COLON CA, HX.OF COLON POLYPS, GERD.  The various methods of treatment have been discussed with the patient and family. After consideration of risks, benefits and other options for treatment, the patient has consented to  Procedure(s) with comments: ESOPHAGOGASTRODUODENOSCOPY (EGD) (N/A) - DM COLONOSCOPY WITH PROPOFOL (N/A) as a surgical intervention.  The patient's history has been reviewed, patient examined, no change in status, stable for surgery.  I have reviewed the patient's chart and labs.  Questions were answered to the patient's satisfaction.     [place], [place]

## 2021-01-06 NOTE — H&P (Signed)
Outpatient short stay form Pre-procedure 01/06/2021 12:34 PM Aaron Jones Aaron Jones, M.D.  Primary Physician: Amaryllis Dyke,   Reason for visit:  iron deficiency anemia, personal history of colon cancer  History of present illness:  Patient presents for diagnosis of progressive anemia and found to have iron deficiency. Has no complaints of upper symptoms such as anorexia, abdominal pain, severe GERD, dysphagia, hemetemesis, melena, nausea or vomiting. Patient denies change in bowel habits, rectal bleeding, weight loss or abdominal pain.    75  year old patient presenting for personal history of colon cancer. Patient denies any change in bowel habits, rectal bleeding or involuntary weight loss.     No current facility-administered medications for this encounter.  Current Outpatient Medications:    acetaminophen (TYLENOL) 500 MG tablet, Take 1,000 mg by mouth daily as needed for moderate pain or headache., Disp: , Rfl:    amiodarone (PACERONE) 200 MG tablet, Take 1 tablet (200 mg total) by mouth daily., Disp: 90 tablet, Rfl: 3   atorvastatin (LIPITOR) 20 MG tablet, Take 1 tablet (20 mg total) by mouth at bedtime., Disp: 90 tablet, Rfl: 2   ELIQUIS 5 MG TABS tablet, TAKE 1 TABLET BY MOUTH TWICE DAILY, Disp: 180 tablet, Rfl: 1   ferrous sulfate 325 (65 FE) MG tablet, Take 325 mg by mouth daily with breakfast., Disp: , Rfl:    furosemide (LASIX) 40 MG tablet, TAKE 1 TABLET BY MOUTH EVERY DAY, Disp: 30 tablet, Rfl: 1   glipiZIDE (GLUCOTROL) 10 MG tablet, Take 5-10 mg by mouth See admin instructions. '10mg'$  in the am and '5mg'$  in the pm, Disp: , Rfl:    Glucose Blood (BLOOD GLUCOSE TEST STRIPS) STRP, 1 strip by In Vitro route 2 (two) times daily., Disp: 100 each, Rfl: 0   meloxicam (MOBIC) 15 MG tablet, Take 15 mg by mouth daily., Disp: , Rfl:    metFORMIN (GLUCOPHAGE) 1000 MG tablet, Take 1,000 mg by mouth 2 (two) times daily., Disp: , Rfl: 1   metoprolol succinate (TOPROL-XL) 100 MG 24 hr tablet, Take 1  tablet (100 mg total) by mouth at bedtime. Take with or immediately following a meal., Disp: 90 tablet, Rfl: 3   omeprazole (PRILOSEC) 40 MG capsule, Take 40 mg by mouth daily before breakfast. , Disp: , Rfl:    sertraline (ZOLOFT) 50 MG tablet, Take 50 mg by mouth daily., Disp: , Rfl:    sitaGLIPtin (JANUVIA) 25 MG tablet, Take 25 mg by mouth daily., Disp: , Rfl:   No medications prior to admission.     Allergies  Allergen Reactions   Biaxin [Clarithromycin] Nausea Only     Past Medical History:  Diagnosis Date   Atrial fibrillation with rapid ventricular response ([place]) 01/16/2013   a. 01/2013 s/p R & L Maze and LAA clipping in setting of VSD repair;  b. Prev on amiodarone;  c. CHA2DS2VASc = 4-->Apixaban;  c. 01/2013 Recurrent afib/flutter requiring DCCV.   CHF (congestive heart failure) (HCC)    Colon polyp    COPD (chronic obstructive pulmonary disease) ([place])    Coronary artery disease    a. 01/2013 s/p CABG x 1 (VG->PDA) in setting of VSD repair.   GERD (gastroesophageal reflux disease)    Hyperlipidemia    Hypertension    PCP- [place], phone; [phone]   Mitral valve disorder    Obesity    PAT (paroxysmal atrial tachycardia) ([place])    a. 02/2015 s/p DCCV;  b. 12/2016 recurrent PAT - unchanged with  escalating BB dose (rates 106-107).   PSVT (paroxysmal supraventricular tachycardia) ([place])    a. 10/2011 Admitted to [facility] w/ SVT-->broke with IV dilt.   Recurrent Nephrolithiasis    Schatzki's ring    Sleep apnea    [facility]- in process of being evaluated now, states 12 yrs. ago was on CPAP but after having his deviated septum repaired, he had put the CPAP in storage. Pt. will get a new machine soon.    Thoracic aortic aneurysm ([place])    a. 01/2016 CTA chest: 4.0 cm Asc Ao; b. 01/2017 Echo: nl Ao root size.   Type II diabetes mellitus ([place])    Umbilical hernia    "not repaired" (01/16/2013)   VSD (ventricular septal defect) w/ persistent systolic murmur    a. 123XX123 s/p VSD  closure (dacron patch);  b. 01/2017 Echo: EF 50-55%, no rwma, mild AS, mild MR, mod dil LA, dilated RV, sev dil RA, mild TR. PASP nl.    Review of systems:  Otherwise negative.    Physical Exam  Gen: Alert, oriented. Appears stated age.  HEENT: [place]/AT. PERRLA. Lungs: CTA, no wheezes. CV: RR nl S1, S2. Abd: soft, benign, no masses. BS+ Ext: No edema. Pulses 2+    Planned procedures: Proceed with EGD and colonoscopy. The patient understands the nature of the planned procedure, indications, risks, alternatives and potential complications including but not limited to bleeding, infection, perforation, damage to internal organs and possible oversedation/side effects from anesthesia. The patient agrees and gives consent to proceed.  Please refer to procedure notes for findings, recommendations and patient disposition/instructions.     Aaron Jones, M.D. Gastroenterology 01/06/2021  12:34 PM

## 2021-01-06 NOTE — Anesthesia Postprocedure Evaluation (Signed)
Anesthesia Post Note  Patient: TERIC BOGDA  Procedure(s) Performed: ESOPHAGOGASTRODUODENOSCOPY (EGD) COLONOSCOPY WITH PROPOFOL  Patient location during evaluation: PACU Anesthesia Type: General Level of consciousness: awake and alert Pain management: pain level controlled Vital Signs Assessment: post-procedure vital signs reviewed and stable Respiratory status: spontaneous breathing, nonlabored ventilation, respiratory function stable and patient connected to nasal cannula oxygen Cardiovascular status: blood pressure returned to baseline and stable Postop Assessment: no apparent nausea or vomiting Anesthetic complications: no   No notable events documented.   Last Vitals:  Vitals:   01/05/21 1124 01/05/21 1134  BP: 127/69 (!) 143/72  Pulse: 90 91  Resp: 11 15  Temp:    SpO2: 100% 100%    Last Pain:  Vitals:   01/05/21 1134  TempSrc:   PainSc: 0-No pain                 Molli Barrows

## 2021-01-10 ENCOUNTER — Other Ambulatory Visit: Payer: Self-pay

## 2021-01-10 ENCOUNTER — Emergency Department: Payer: Medicare Other

## 2021-01-10 DIAGNOSIS — I2510 Atherosclerotic heart disease of native coronary artery without angina pectoris: Secondary | ICD-10-CM | POA: Insufficient documentation

## 2021-01-10 DIAGNOSIS — S60512A Abrasion of left hand, initial encounter: Secondary | ICD-10-CM | POA: Diagnosis not present

## 2021-01-10 DIAGNOSIS — J449 Chronic obstructive pulmonary disease, unspecified: Secondary | ICD-10-CM | POA: Diagnosis not present

## 2021-01-10 DIAGNOSIS — S8992XA Unspecified injury of left lower leg, initial encounter: Secondary | ICD-10-CM | POA: Diagnosis present

## 2021-01-10 DIAGNOSIS — Z951 Presence of aortocoronary bypass graft: Secondary | ICD-10-CM | POA: Diagnosis not present

## 2021-01-10 DIAGNOSIS — Z79899 Other long term (current) drug therapy: Secondary | ICD-10-CM | POA: Diagnosis not present

## 2021-01-10 DIAGNOSIS — I110 Hypertensive heart disease with heart failure: Secondary | ICD-10-CM | POA: Diagnosis not present

## 2021-01-10 DIAGNOSIS — S0083XA Contusion of other part of head, initial encounter: Secondary | ICD-10-CM | POA: Diagnosis not present

## 2021-01-10 DIAGNOSIS — S60511A Abrasion of right hand, initial encounter: Secondary | ICD-10-CM | POA: Diagnosis not present

## 2021-01-10 DIAGNOSIS — W01198A Fall on same level from slipping, tripping and stumbling with subsequent striking against other object, initial encounter: Secondary | ICD-10-CM | POA: Insufficient documentation

## 2021-01-10 DIAGNOSIS — Z7901 Long term (current) use of anticoagulants: Secondary | ICD-10-CM | POA: Insufficient documentation

## 2021-01-10 DIAGNOSIS — Z7984 Long term (current) use of oral hypoglycemic drugs: Secondary | ICD-10-CM | POA: Diagnosis not present

## 2021-01-10 DIAGNOSIS — S8002XA Contusion of left knee, initial encounter: Secondary | ICD-10-CM | POA: Diagnosis not present

## 2021-01-10 DIAGNOSIS — I509 Heart failure, unspecified: Secondary | ICD-10-CM | POA: Diagnosis not present

## 2021-01-10 DIAGNOSIS — E119 Type 2 diabetes mellitus without complications: Secondary | ICD-10-CM | POA: Insufficient documentation

## 2021-01-10 DIAGNOSIS — I4891 Unspecified atrial fibrillation: Secondary | ICD-10-CM | POA: Insufficient documentation

## 2021-01-10 DIAGNOSIS — S0012XA Contusion of left eyelid and periocular area, initial encounter: Secondary | ICD-10-CM | POA: Diagnosis not present

## 2021-01-10 NOTE — ED Triage Notes (Signed)
Pt states that he got tripped up in a dog leash today and fell, pt has abrasiosn to left side of head, right hand and left knee. Pt denies LOC, pt states he takes eliquis. Pt denies headache, has some throbbing over abrasion.

## 2021-01-11 ENCOUNTER — Emergency Department
Admission: EM | Admit: 2021-01-11 | Discharge: 2021-01-11 | Disposition: A | Discharge status: Home / Self Care | Payer: Medicare Other | Attending: Emergency Medicine | Admitting: Emergency Medicine

## 2021-01-11 ENCOUNTER — Other Ambulatory Visit: Payer: Self-pay

## 2021-01-11 DIAGNOSIS — S8002XA Contusion of left knee, initial encounter: Secondary | ICD-10-CM

## 2021-01-11 DIAGNOSIS — T07XXXA Unspecified multiple injuries, initial encounter: Secondary | ICD-10-CM

## 2021-01-11 DIAGNOSIS — W19XXXA Unspecified fall, initial encounter: Secondary | ICD-10-CM

## 2021-01-11 DIAGNOSIS — S0083XA Contusion of other part of head, initial encounter: Secondary | ICD-10-CM

## 2021-01-11 MED ORDER — CEPHALEXIN 500 MG PO CAPS
500.0000 mg | ORAL_CAPSULE | Freq: Once | ORAL | Status: AC
  Administered 2021-01-11: 500 mg via ORAL
  Filled 2021-01-11: qty 1

## 2021-01-11 MED ORDER — BACITRACIN ZINC 500 UNIT/GM EX OINT: TOPICAL_OINTMENT | Freq: Two times a day (BID) | CUTANEOUS | Status: DC

## 2021-01-11 MED ORDER — CEPHALEXIN 500 MG PO CAPS: 500.0000 mg | ORAL_CAPSULE | Freq: Three times a day (TID) | ORAL | 0 refills | Status: AC

## 2021-01-11 NOTE — Discharge Instructions (Signed)
We evaluated you after your fall.  Fortunately your imaging is reassuring with no evidence of intracranial bleeding nor fractures of your left leg/knee.  You will need to keep your wounds clean and dry, dressed with a layer of bacitracin and gauze, changed twice daily. Use the Ace wrap and provided crutches for comfort, but you may bear weight as tolerated.  Please take the full course of prescribed antibiotics to try and prevent infection of your abrasions.  Follow up with your regular doctor at the next available opportunity.  Return to the Emergency Department with new or worsening symptoms that concern you.

## 2021-01-11 NOTE — ED Provider Notes (Signed)
Aaron Jones  ____________________________________________   Event Date/Time   First MD Initiated Contact with Patient 01/11/21 [phone]     (approximate)  I have reviewed the triage vital signs and the nursing notes.   HISTORY  Chief Complaint Fall    HPI Aaron Jones is a 75 y.o. male with medical history as listed below which notably includes anticoagulation on Eliquis for A. fib.  He presents after a fall earlier today.  He was walking his neighbors dog when the dog ran around his legs and got entangled off and then pulled him over onto his left knee and his hands.  He has multiple skin abrasions to the back of both hands as well as to his left knee which he bandaged himself at home.  He reports a significant mount of pain in the left knee with weightbearing and with attempts at ambulation.  He also struck the left side of his forehead and was concerned because he is on Eliquis.  He has some bruising around his eye and some swelling as well as some minor abrasions.  No neck pain.  He denies chest pain and shortness of breath as well as nausea and vomiting.  Onset was acute and the incident was severe but right now his pain is mostly only when he moves his left knee.     Past Medical History:  Diagnosis Date   Atrial fibrillation with rapid ventricular response ([place]) 01/16/2013   a. 01/2013 s/p R & L Maze and LAA clipping in setting of VSD repair;  b. Prev on amiodarone;  c. CHA2DS2VASc = 4-->Apixaban;  c. 01/2013 Recurrent afib/flutter requiring DCCV.   CHF (congestive heart failure) (HCC)    Colon polyp    COPD (chronic obstructive pulmonary disease) ([place])    Coronary artery disease    a. 01/2013 s/p CABG x 1 (VG->PDA) in setting of VSD repair.   GERD (gastroesophageal reflux disease)    Hyperlipidemia    Hypertension    PCP- [place], phone; [phone]   Mitral valve disorder    Obesity    PAT (paroxysmal  atrial tachycardia) ([place])    a. 02/2015 s/p DCCV;  b. 12/2016 recurrent PAT - unchanged with escalating BB dose (rates 106-107).   PSVT (paroxysmal supraventricular tachycardia) ([place])    a. 10/2011 Admitted to [facility] w/ SVT-->broke with IV dilt.   Recurrent Nephrolithiasis    Schatzki's ring    Sleep apnea    [facility]- in process of being evaluated now, states 12 yrs. ago was on CPAP but after having his deviated septum repaired, he had put the CPAP in storage. Pt. will get a new machine soon.    Thoracic aortic aneurysm ([place])    a. 01/2016 CTA chest: 4.0 cm Asc Ao; b. 01/2017 Echo: nl Ao root size.   Type II diabetes mellitus ([place])    Umbilical hernia    "not repaired" (01/16/2013)   VSD (ventricular septal defect) w/ persistent systolic murmur    a. 123XX123 s/p VSD closure (dacron patch);  b. 01/2017 Echo: EF 50-55%, no rwma, mild AS, mild MR, mod dil LA, dilated RV, sev dil RA, mild TR. PASP nl.    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[place]) 10/11/2016   Coronary artery disease involving native coronary artery of native heart without angina pectoris 11/19/2015   History of atrial fibrillation 11/19/2015   Controlled type 2 diabetes mellitus  without complication, without long-term current use of insulin (HCC) 08/13/2015   Atrial flutter ([place]) 03/15/2015   Sinus tachycardia 12/23/2014   Sleep apnea 11/12/2013   Paroxysmal supraventricular tachycardia (HCC) 99991111   Acute diastolic heart failure ([place]) 11/23/2012   Hypokalemia 11/18/2012   Edema of left lower extremity 11/18/2012   Mild difuse Hypokinesis 11/18/2012   Ventricular septal defect 11/18/2012   Mild Pulmonary HTN 11/18/2012   Atrial fibrillation with rapid ventricular response ([place]) 11/17/2012   Benign essential hypertension 11/17/2012   Type II or unspecified type diabetes mellitus without mention of complication, not stated as uncontrolled 11/17/2012   Dyslipidemia 11/17/2012   Heart murmur  11/17/2012    Past Surgical History:  Procedure Laterality Date   CARDIAC CATHETERIZATION  > 5 yr ago   Done at [place], reportedly clean   CARDIAC CATHETERIZATION  11/03/2012   CARDIOVERSION N/A 02/12/2015   Procedure: CARDIOVERSION;  Surgeon: Thayer Headings, MD;  Location: [facility];  Service: Cardiovascular;  Laterality: N/A;   CARDIOVERSION N/A 04/27/2017   Procedure: CARDIOVERSION;  Surgeon: Evans Lance, MD;  Location: [facility] CV [facility];  Service: Cardiovascular;  Laterality: N/A;   CLIPPING OF ATRIAL APPENDAGE N/A 01/07/2013   Procedure: CLIPPING OF ATRIAL APPENDAGE;  Surgeon: Grace Isaac, MD;  Location: [facility];  Service: Open Heart Surgery;  Laterality: N/A;   COLONOSCOPY W/ POLYPECTOMY     COLONOSCOPY WITH PROPOFOL N/A 01/05/2021   Procedure: COLONOSCOPY WITH PROPOFOL;  Surgeon: Toledo, Benay Pike, MD;  Location: [facility] ENDOSCOPY;  Service: Gastroenterology;  Laterality: N/A;   CORONARY ARTERY BYPASS GRAFT N/A 01/07/2013   Procedure: CORONARY ARTERY BYPASS GRAFTING (CABG);  Surgeon: Grace Isaac, MD;  Location: [facility];  Service: Open Heart Surgery;  Laterality: N/A;  Times1 using endoscopically harvested saphenous vein graft to the PDA   ESOPHAGOGASTRODUODENOSCOPY N/A 01/05/2021   Procedure: ESOPHAGOGASTRODUODENOSCOPY (EGD);  Surgeon: Toledo, Benay Pike, MD;  Location: [facility] ENDOSCOPY;  Service: Gastroenterology;  Laterality: N/A;  DM   EYE SURGERY Left 02/03/1989   "clipped muscle so it wouldn't be pulling up" (01/16/2013)   INTRAOPERATIVE TRANSESOPHAGEAL ECHOCARDIOGRAM N/A 01/07/2013   Procedure: INTRAOPERATIVE TRANSESOPHAGEAL ECHOCARDIOGRAM;  Surgeon: Grace Isaac, MD;  Location: [facility];  Service: Open Heart Surgery;  Laterality: N/A;   LEFT AND RIGHT HEART CATHETERIZATION WITH CORONARY ANGIOGRAM N/A 11/20/2012   Procedure: LEFT AND RIGHT HEART CATHETERIZATION WITH CORONARY ANGIOGRAM;  Surgeon: Jolaine Artist, MD;  Location: [facility] CATH LAB;  Service: Cardiovascular;   Laterality: N/A;   MAZE N/A 01/07/2013   Procedure: MAZE;  Surgeon: Grace Isaac, MD;  Location: [place];  Service: Open Heart Surgery;  Laterality: N/A;   MULTIPLE EXTRACTIONS WITH ALVEOLOPLASTY N/A 12/11/2012   Procedure: Extraction of tooth #'s [id number] wioth alveoloplasty and bialteral fibrous tuberosity reductions.;  Surgeon: Lenn Cal, DDS;  Location: [facility];  Service: Oral Surgery;  Laterality: N/A;   NASAL SEPTUM SURGERY  06/05/2002   sepfal deveation repair     TEE WITHOUT CARDIOVERSION N/A 11/21/2012   Procedure: TRANSESOPHAGEAL ECHOCARDIOGRAM (TEE);  Surgeon: Jolaine Artist, MD;  Location[facility] ENDOSCOPY;  Service: Cardiovascular;  Laterality: N/A;   VSD REPAIR N/A 01/07/2013   Procedure: VENTRICULAR SEPTAL DEFECT (VSD) REPAIR;  Surgeon: Grace Isaac, MD;  Location: [place];  Service: Open Heart Surgery;  Laterality: N/A;   VSD REPAIR      Prior to Admission medications   Medication Sig Start Date End Date Taking? Authorizing Provider  cephALEXin (KEFLEX) 500  MG capsule Take 1 capsule (500 mg total) by mouth 3 (three) times daily for 5 days. 01/11/21 01/16/21 Yes Hinda Kehr, MD  acetaminophen (TYLENOL) 500 MG tablet Take 1,000 mg by mouth daily as needed for moderate pain or headache.    [provider]  amiodarone (PACERONE) 200 MG tablet Take 1 tablet (200 mg total) by mouth daily. 10/25/20   Shirley Friar, PA-C  atorvastatin (LIPITOR) 20 MG tablet Take 1 tablet (20 mg total) by mouth at bedtime. 01/06/19   Theora Gianotti, NP  ELIQUIS 5 MG TABS tablet TAKE 1 TABLET BY MOUTH TWICE DAILY 10/08/18   Evans Lance, MD  ferrous sulfate 325 (65 FE) MG tablet Take 325 mg by mouth daily with breakfast.    [provider]  furosemide (LASIX) 40 MG tablet TAKE 1 TABLET BY MOUTH EVERY DAY    Nahser, Wonda Cheng, MD  glipiZIDE (GLUCOTROL) 10 MG tablet Take 5-10 mg by mouth See admin instructions. '10mg'$  in the am  and '5mg'$  in the pm 12/22/16   [provider]  Glucose Blood (BLOOD GLUCOSE TEST STRIPS) STRP 1 strip by In Vitro route 2 (two) times daily. 11/25/12   Lelon Perla, MD  meloxicam (MOBIC) 15 MG tablet Take 15 mg by mouth daily.    [provider]  metFORMIN (GLUCOPHAGE) 1000 MG tablet Take 1,000 mg by mouth 2 (two) times daily. 01/16/15   [provider]  metoprolol succinate (TOPROL-XL) 100 MG 24 hr tablet Take 1 tablet (100 mg total) by mouth at bedtime. Take with or immediately following a meal. 12/30/20 03/30/21  Tillery, Satira Mccallum, PA-C  omeprazole (PRILOSEC) 40 MG capsule Take 40 mg by mouth daily before breakfast.     [provider]  sertraline (ZOLOFT) 50 MG tablet Take 50 mg by mouth daily. 03/04/19   [provider]  sitaGLIPtin (JANUVIA) 25 MG tablet Take 25 mg by mouth daily.    [provider]    Allergies Biaxin [clarithromycin]  Family History  Problem Relation Age of Onset   Cancer Mother    Diabetes Mother     Social History Social History   Tobacco Use   Smoking status: Never   Smokeless tobacco: Never  Vaping Use   Vaping Use: Never used  Substance Use Topics   Alcohol use: No   Drug use: No    Review of Systems Constitutional: No fever/chills Eyes: No visual changes. ENT: No sore throat. Cardiovascular: Denies chest pain. Respiratory: Denies shortness of breath. Gastrointestinal: No abdominal pain.  No nausea, no vomiting.  No diarrhea.  No constipation. Genitourinary: Negative for dysuria. Musculoskeletal: Pain in left knee. Integumentary: Multiple abrasions on left side of face, both hands, and left knee. Neurological: Negative for headaches, focal weakness or numbness.   ____________________________________________   PHYSICAL EXAM:  VITAL SIGNS: ED Triage Vitals  Enc Vitals Group     BP 01/10/21 2130 122/70     Pulse Rate 01/10/21 2130 94     Resp 01/10/21 2130 18     Temp  01/10/21 2130 98.6 F (37 C)     Temp Source 01/10/21 2130 Oral     SpO2 01/10/21 2130 99 %     Weight 01/10/21 2131 99.8 kg (220 lb)     Height 01/10/21 2131 1.829 m (6')     Head Circumference --      Peak Flow --      Pain Score 01/10/21 2135 0  Pain Loc --      Pain Edu? --      Excl. in [place]? --     Constitutional: Alert and oriented.  Eyes: Conjunctivae are normal.  Head: Black eye with bruising around the left eye with a few small abrasions and superficial lacerations. Nose: No epistaxis. Mouth/Throat: Patient is wearing a mask. Neck: No stridor.  No meningeal signs.   Cardiovascular: Normal rate, regular rhythm. Good peripheral circulation. Respiratory: Normal respiratory effort.  No retractions. Gastrointestinal: Soft and nontender. No distention.  Musculoskeletal: Contusion to the left knee but without any significant swelling.  No unexpected tenderness to palpation.  Patient is able to flex and extend his knee although it is painful to do so because of the large abrasion to the top of the knee but the abrasion is superficial and there are no defects to be repaired. Neurologic:  Normal speech and language. No gross focal neurologic deficits are appreciated.  Skin:  Skin is warm and dry.  He has multiple abrasions most notable on the backs of both of his hands along each knuckle and abrasion to the left knee as well as the facial abrasions as mentioned above. Psychiatric: Mood and affect are normal. Speech and behavior are normal.  ____________________________________________   LABS (all labs ordered are listed, but only abnormal results are displayed)  Labs Reviewed - No data to display ____________________________________________  RADIOLOGY I, Hinda Kehr, personally viewed and evaluated these images (plain radiographs) as part of my medical decision making, as well as reviewing the written report by the radiologist.  ED MD interpretation: No acute abnormality on  head CT.  No indication of fracture nor dislocation on left knee x-ray  Official radiology report(s): CT HEAD WO CONTRAST (5MM)  Result Date: 01/10/2021 CLINICAL DATA:  Fall, on blood thinners EXAM: CT HEAD WITHOUT CONTRAST TECHNIQUE: Contiguous axial images were obtained from the base of the skull through the vertex without intravenous contrast. COMPARISON:  None. FINDINGS: Brain: There is atrophy and chronic small vessel disease changes. No acute intracranial abnormality. Specifically, no hemorrhage, hydrocephalus, mass lesion, acute infarction, or significant intracranial injury. Vascular: No hyperdense vessel or unexpected calcification. Skull: No acute calvarial abnormality. Sinuses/Orbits: Visualized paranasal sinuses and mastoids clear. Orbital soft tissues unremarkable. Other: None IMPRESSION: Atrophy, chronic microvascular disease. No acute intracranial abnormality. Electronically Signed   By: Rolm Baptise M.D.   On: 01/10/2021 22:02   DG Knee Complete 4 Views Left  Result Date: 01/10/2021 CLINICAL DATA:  Fall EXAM: LEFT KNEE - COMPLETE 4+ VIEW COMPARISON:  None. FINDINGS: No evidence of fracture, dislocation, or joint effusion. No evidence of arthropathy or other focal bone abnormality. Soft tissues are unremarkable. IMPRESSION: Negative. Electronically Signed   By: Rolm Baptise M.D.   On: 01/10/2021 21:52    ____________________________________________   PROCEDURES   Procedure(s) performed (including Critical Care):  Procedures   ____________________________________________   INITIAL IMPRESSION / MDM / [place] / ED COURSE  As part of my medical decision making, I reviewed the following data within the [facility] notes reviewed and incorporated, Old chart reviewed, Radiograph reviewed , and Notes from prior ED visits   Differential diagnosis includes, but is not limited to, contusion, fracture, dislocation, intracranial bleeding.   Stable  vital signs.  Patient has been awaiting evaluation for about 7 hours due to overwhelming ED and hospital patient volumes and limited staffing and he is in no distress.  He does have some pain and tenderness  with use of his knee but I personally reviewed the patient's imaging and agree with the radiologist's interpretation that there is no evidence of fracture or dislocation on his knee x-rays.  I believe he has a contusion and abrasion but based on the physical exam and the imaging I strongly doubt an occult tibial plateau or other fracture.  I provided reassurance to the patient, also regarding the noncontrast head CT that showed no sign of internal bleeding.  We will clean and dress his wounds with bacitracin and gauze and I will provide an Ace wrap for his knee as well as crutches for comfort.  He is comfortable with the plan for discharge and outpatient follow-up.  I gave my usual and customary return precautions.       Clinical Course as of 01/11/21 0532  Tue Jan 11, 2021  0439 Of Jones, the patient reports that he has had a Tdap within the last 5 years.  Given the multiple abrasions and his age and comorbidities I will err on the side of caution by giving him some prophylactic antibiotics including a dose of Keflex 500 mg while in the ED. [CF]    Clinical Course User Index [CF] Hinda Kehr, MD     ____________________________________________  FINAL CLINICAL IMPRESSION(S) / ED DIAGNOSES  Final diagnoses:  Fall, initial encounter  Multiple abrasions  Contusion of left knee, initial encounter  Facial contusion, initial encounter     MEDICATIONS GIVEN DURING THIS VISIT:  Medications  bacitracin ointment (has no administration in time range)  cephALEXin (KEFLEX) capsule 500 mg (500 mg Oral Given 01/11/21 0500)     ED Discharge Orders          Ordered    cephALEXin (KEFLEX) 500 MG capsule  3 times daily        01/11/21 0441             Jones:  This document was prepared  using Dragon voice recognition software and may include unintentional dictation errors.   Hinda Kehr, MD 01/11/21 [phone]

## 2021-01-12 MED ORDER — METOPROLOL SUCCINATE ER 100 MG PO TB24: 100.0000 mg | ORAL_TABLET | Freq: Every day | ORAL | 3 refills | Status: DC

## 2021-01-27 ENCOUNTER — Ambulatory Visit: Payer: Medicare Other | Admitting: Internal Medicine

## 2021-04-25 ENCOUNTER — Encounter: Payer: Self-pay | Admitting: Cardiology

## 2021-04-25 ENCOUNTER — Ambulatory Visit: Payer: Medicare Other | Admitting: Internal Medicine

## 2021-04-27 ENCOUNTER — Ambulatory Visit (INDEPENDENT_AMBULATORY_CARE_PROVIDER_SITE_OTHER): Payer: Medicare Other

## 2021-04-27 ENCOUNTER — Encounter: Payer: Self-pay | Admitting: Cardiology

## 2021-04-27 ENCOUNTER — Other Ambulatory Visit: Payer: Self-pay

## 2021-04-27 ENCOUNTER — Ambulatory Visit (INDEPENDENT_AMBULATORY_CARE_PROVIDER_SITE_OTHER): Payer: Medicare Other | Admitting: Cardiology

## 2021-04-27 VITALS — BP 132/80 | HR 76 | Ht 72.0 in | Wt 216.0 lb

## 2021-04-27 DIAGNOSIS — I4819 Other persistent atrial fibrillation: Secondary | ICD-10-CM | POA: Diagnosis not present

## 2021-04-27 DIAGNOSIS — I4892 Unspecified atrial flutter: Secondary | ICD-10-CM

## 2021-04-27 DIAGNOSIS — I10 Essential (primary) hypertension: Secondary | ICD-10-CM | POA: Diagnosis not present

## 2021-04-27 DIAGNOSIS — Q210 Ventricular septal defect: Secondary | ICD-10-CM

## 2021-04-27 DIAGNOSIS — I4891 Unspecified atrial fibrillation: Secondary | ICD-10-CM

## 2021-04-27 NOTE — Progress Notes (Signed)
Electrophysiology Office Follow up Visit Note:    Date:  04/27/2021   ID:  Aaron Jones, DOB 06/28/1945, MRN [id number]  PCP:  Kathlyn Sacramento, MD  [facility] Cardiologist:  Cristopher Peru, MD  [facility] Electrophysiologist:  Cristopher Peru, MD    Interval History:    Aaron Jones is a 75 y.o. male who presents for a follow up visit.   He was previously followed by Dr. Lovena Le.  He was last seen by Aaron Jones December 07, 2020.  At that visit he reported fatigue and occasional lightheadedness.  He takes Eliquis for history of paroxysmal atrial fibrillation/atrial flutter.  He also takes amiodarone 200 mg daily.  This medical history includes coronary disease post CABG in 2014, right and left-sided maze, left atrial appendage clipping, VSD repair.  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  Today he confirms the above and tells that he is feeling frequently fatigued.  He cannot tell when he is in or out of atrial fibrillation.  He also tells me he is not taking any of his medications routinely.  He tells me that he takes them maybe 2-3 times per week.    Past Medical History:  Diagnosis Date   Atrial fibrillation with rapid ventricular response ([place]) 01/16/2013   a. 01/2013 s/p R & L Maze and LAA clipping in setting of VSD repair;  b. Prev on amiodarone;  c. CHA2DS2VASc = 4-->Apixaban;  c. 01/2013 Recurrent afib/flutter requiring DCCV.   CHF (congestive heart failure) (HCC)    Colon polyp    COPD (chronic obstructive pulmonary disease) ([place])    Coronary artery disease    a. 01/2013 s/p CABG x 1 (VG->PDA) in setting of VSD repair.   GERD (gastroesophageal reflux disease)    Hyperlipidemia    Hypertension    PCP- Aaron Jones, phone; [phone]   Mitral valve disorder    Obesity    PAT (paroxysmal atrial tachycardia) (Aaron Jones)    a. 02/2015 s/p DCCV;  b. 12/2016 recurrent PAT - unchanged with escalating BB dose (rates 106-107).   PSVT (paroxysmal supraventricular  tachycardia) (Aaron Jones)    a. 10/2011 Admitted to [facility] w/ SVT-->broke with IV dilt.   Recurrent Nephrolithiasis    Schatzki's ring    Sleep apnea    [facility]- in process of being evaluated now, states 12 yrs. ago was on CPAP but after having his deviated septum repaired, he had put the CPAP in storage. Pt. will get a new machine soon.    Thoracic aortic aneurysm    a. 01/2016 CTA chest: 4.0 cm Asc Ao; b. 01/2017 Echo: nl Ao root size.   Type II diabetes mellitus ([place])    Umbilical hernia    "not repaired" (01/16/2013)   VSD (ventricular septal defect) w/ persistent systolic murmur    a. 11/5463 s/p VSD closure (dacron patch);  b. 01/2017 Echo: EF 50-55%, no rwma, mild AS, mild MR, mod dil LA, dilated RV, sev dil RA, mild TR. PASP nl.    Past Surgical History:  Procedure Laterality Date   CARDIAC CATHETERIZATION  > 5 yr ago   62 at [facility], reportedly clean   CARDIAC CATHETERIZATION  11/03/2012   CARDIOVERSION N/A 02/12/2015   Procedure: CARDIOVERSION;  Surgeon: Aaron Headings, MD;  Location: [place];  Service: Cardiovascular;  Laterality: N/A;   CARDIOVERSION N/A 04/27/2017   Procedure: CARDIOVERSION;  Surgeon: Aaron Lance, MD;  Location: [place] [facility];  Service: Cardiovascular;  Laterality:  N/A;   CLIPPING OF ATRIAL APPENDAGE N/A 01/07/2013   Procedure: CLIPPING OF ATRIAL APPENDAGE;  Surgeon: Aaron Isaac, MD;  Location: [place];  Service: Open Heart Surgery;  Laterality: N/A;   COLONOSCOPY W/ POLYPECTOMY     COLONOSCOPY WITH PROPOFOL N/A 01/05/2021   Procedure: COLONOSCOPY WITH PROPOFOL;  Surgeon: Jones, Aaron Pike, MD;  Location: [facility] ENDOSCOPY;  Service: Gastroenterology;  Laterality: N/A;   CORONARY ARTERY BYPASS GRAFT N/A 01/07/2013   Procedure: CORONARY ARTERY BYPASS GRAFTING (CABG);  Surgeon: Aaron Isaac, MD;  Location: [place];  Service: Open Heart Surgery;  Laterality: N/A;  Times1 using endoscopically harvested saphenous vein graft to the PDA    ESOPHAGOGASTRODUODENOSCOPY N/A 01/05/2021   Procedure: ESOPHAGOGASTRODUODENOSCOPY (EGD);  Surgeon: Jones, Aaron Pike, MD;  Location: [facility] ENDOSCOPY;  Service: Gastroenterology;  Laterality: N/A;  DM   EYE SURGERY Left 02/03/1989   "clipped muscle so it wouldn't be pulling up" (01/16/2013)   INTRAOPERATIVE TRANSESOPHAGEAL ECHOCARDIOGRAM N/A 01/07/2013   Procedure: INTRAOPERATIVE TRANSESOPHAGEAL ECHOCARDIOGRAM;  Surgeon: Aaron Isaac, MD;  Location: [place];  Service: Open Heart Surgery;  Laterality: N/A;   LEFT AND RIGHT HEART CATHETERIZATION WITH CORONARY ANGIOGRAM N/A 11/20/2012   Procedure: LEFT AND RIGHT HEART CATHETERIZATION WITH CORONARY ANGIOGRAM;  Surgeon: Aaron Artist, MD;  Location: [facility] CATH LAB;  Service: Cardiovascular;  Laterality: N/A;   MAZE N/A 01/07/2013   Procedure: MAZE;  Surgeon: Aaron Isaac, MD;  Location: [place];  Service: Open Heart Surgery;  Laterality: N/A;   MULTIPLE EXTRACTIONS WITH ALVEOLOPLASTY N/A 12/11/2012   Procedure: Extraction of tooth #'s 9,9,3,7,16,96,78,93,81,01,75,10,25,85,27 wioth alveoloplasty and bialteral fibrous tuberosity reductions.;  Surgeon: Aaron Jones, DDS;  Location: [facility];  Service: Oral Surgery;  Laterality: N/A;   NASAL SEPTUM SURGERY  06/05/2002   sepfal deveation repair     TEE WITHOUT CARDIOVERSION N/A 11/21/2012   Procedure: TRANSESOPHAGEAL ECHOCARDIOGRAM (TEE);  Surgeon: Aaron Artist, MD;  Location: [facility] ENDOSCOPY;  Service: Cardiovascular;  Laterality: N/A;   VSD REPAIR N/A 01/07/2013   Procedure: VENTRICULAR SEPTAL DEFECT (VSD) REPAIR;  Surgeon: Aaron Isaac, MD;  Location: [place];  Service: Open Heart Surgery;  Laterality: N/A;   VSD REPAIR      Current Medications: Current Meds  Medication Sig   acetaminophen (TYLENOL) 500 MG tablet Take 1,000 mg by mouth daily as needed for moderate pain or headache.   atorvastatin (LIPITOR) 20 MG tablet Take 1 tablet (20 mg total) by mouth at bedtime.   ELIQUIS 5 MG  TABS tablet TAKE 1 TABLET BY MOUTH TWICE DAILY   ferrous sulfate 325 (65 FE) MG tablet Take 325 mg by mouth daily with breakfast.   furosemide (LASIX) 40 MG tablet TAKE 1 TABLET BY MOUTH EVERY DAY   glipiZIDE (GLUCOTROL) 10 MG tablet Take 5-10 mg by mouth See admin instructions. 10mg  in the am and 5mg  in the pm   Glucose Blood (BLOOD GLUCOSE TEST STRIPS) STRP 1 strip by In Vitro route 2 (two) times daily.   metFORMIN (GLUCOPHAGE) 1000 MG tablet Take 1,000 mg by mouth 2 (two) times daily.   metoprolol succinate (TOPROL-XL) 100 MG 24 hr tablet Take 1 tablet (100 mg total) by mouth at bedtime. Take with or immediately following a meal.   omeprazole (PRILOSEC) 40 MG capsule Take 40 mg by mouth daily before breakfast.    sertraline (ZOLOFT) 50 MG tablet Take 50 mg by mouth daily.   sitaGLIPtin (JANUVIA) 25 MG tablet Take 25 mg by mouth  daily.   [DISCONTINUED] amiodarone (PACERONE) 200 MG tablet Take 1 tablet (200 mg total) by mouth daily.     Allergies:   Biaxin [clarithromycin]   Social History   Socioeconomic History   Marital status: Married    Spouse name: Not on file   Number of children: Not on file   Years of education: Not on file   Highest education level: Not on file  Occupational History   Occupation: Manufacturing    Comment: Heavy work at times  Tobacco Use   Smoking status: Never   Smokeless tobacco: Never  Vaping Use   Vaping Use: Never used  Substance and Sexual Activity   Alcohol use: No   Drug use: No   Sexual activity: Not Currently  Other Topics Concern   Not on file  Social History Narrative   Still works full time. Works around the yard. Lives with wife. Has 2 sisters, neither with cardiac issues.   Social Determinants of Health   Financial Resource Strain: Not on file  Food Insecurity: Not on file  Transportation Needs: Not on file  Physical Activity: Not on file  Stress: Not on file  Social Connections: Not on file     Family History: The  patient's family history includes Cancer in his mother; Diabetes in his mother.  ROS:   Please see the history of present illness.    All other systems reviewed and are negative.  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  Recent Labs: 09/22/2020: Hemoglobin 10.2; Platelets 246 12/07/2020: ALT 8; BUN 14; Creatinine, Ser 1.27; Potassium 4.8; Sodium 140; TSH 0.698  Recent Lipid Panel    Component Value Date/Time   CHOL 142 04/17/2013 0833   CHOL 192 10/29/2011 0429   TRIG 125 04/17/2013 0833   TRIG 150 10/29/2011 0429   HDL 41 04/17/2013 0833   HDL 32 (L) 10/29/2011 0429   CHOLHDL 3.5 04/17/2013 0833   VLDL 30 10/29/2011 0429   LDLCALC 76 04/17/2013 0833   LDLCALC 130 (H) 10/29/2011 0429    Physical Exam:    VS:  BP 132/80   Pulse 76   Ht 6' (1.829 m)   Wt 216 lb (98 kg)   SpO2 98%   BMI 29.29 kg/m     Wt Readings from Last 3 Encounters:  04/27/21 216 lb (98 kg)  01/10/21 220 lb (99.8 kg)  01/05/21 220 lb (99.8 kg)     GEN: Well nourished, well developed in no acute distress.  Obese HEENT: Normal NECK: No JVD; No carotid bruits LYMPHATICS: No lymphadenopathy CARDIAC: Irregular rhythm, harsh 3 out of 6 systolic murmur throughout the precordium, RESPIRATORY:  Clear to auscultation without rales, wheezing or rhonchi  ABDOMEN: Soft,  non-tender, non-distended MUSCULOSKELETAL:  No edema; No deformity  SKIN: Warm and dry NEUROLOGIC:  Alert and oriented x 3 PSYCHIATRIC:  Normal affect  ASSESSMENT:    1. Atrial fibrillation with rapid ventricular response ([place])   2. Benign essential hypertension   3. Atrial flutter, unspecified type ([place])   4. VSD (ventricular septal defect)    PLAN:    In order of problems listed above:  #Persistent atrial fibrillation flutter Unclear burden or whether or not he is still going in and out of rhythm.  He is asymptomatic.  I would like to put a 14-day ZIO monitor on him to assess his burden of atrial fibrillation which could help guide therapy.  #VSD Postrepair but has reopened on the recent echocardiogram.  He has a loud systolic murmur today on exam.  I am concerned that potentially the RV volume/pressure overload could be contributing to his symptoms.  I like to refer him to Dr. Saunders Revel for further evaluation and possible additional imaging/catheterization.  #Medication noncompliance I discussed stroke risk with his atrial arrhythmia today and encouraged him to take his Eliquis twice daily.  We will discontinue his amiodarone since he is not reliably taking a blood thinner.  Follow-up with Dr. Saunders Revel to discuss VSD and determine neck steps about potential imaging.  Follow-up with EP as needed/based on results of above ZIO monitor.    Medication Adjustments/Labs and Tests Ordered: Current medicines are reviewed at length with the patient today.  Concerns regarding medicines are outlined above.  Orders Placed This Encounter  Procedures   EKG 12-Lead   No orders of the defined types were placed in this encounter.    Signed, Lars Mage, MD, [facility], [facility] 04/27/2021 11:27 AM    Electrophysiology [place]

## 2021-04-27 NOTE — Patient Instructions (Addendum)
Medication Instructions:  Your physician has recommended you make the following change in your medication:    STOP amiodarone  Lab Work: None ordered. If you have labs (blood work) drawn today and your tests are completely normal, you will receive your results only by: [place] (if you have MyChart) OR A paper copy in the mail If you have any lab test that is abnormal or we need to change your treatment, we will call you to review the results.  Testing/Procedures: Your physician has recommended that you wear a holter monitor. Holter monitors are medical devices that record the heart's electrical activity. Doctors most often use these monitors to diagnose arrhythmias. Arrhythmias are problems with the speed or rhythm of the heartbeat. The monitor is a small, portable device. You can wear one while you do your normal daily activities. This is usually used to diagnose what is causing palpitations/syncope (passing out).  You will wear a 14 day zio monitor  Follow-Up: At [facility], you and your health needs are our priority.  As part of our continuing mission to provide you with exceptional heart care, we have created designated Provider Care Teams.  These Care Teams include your primary Cardiologist (physician) and Advanced Practice Providers (APPs -  Physician Assistants and Nurse Practitioners) who all work together to provide you with the care you need, when you need it.  Your next appointment:   Your physician wants you to follow-up with EP as needed based on HM results.  You are being referred to Dr. Saunders Revel for general cardiology needs.  Your physician has recommended that you wear a Zio monitor.   This monitor is a medical device that records the heart's electrical activity. Doctors most often use these monitors to diagnose arrhythmias. Arrhythmias are problems with the speed or rhythm of the heartbeat. The monitor is a small device applied to your chest. You can wear one while  you do your normal daily activities. While wearing this monitor if you have any symptoms to push the button and record what you felt. Once you have worn this monitor for the period of time provider prescribed (Usually 14 days), you will return the monitor device in the postage paid box. Once it is returned they will download the data collected and provide [place] with a report which the provider will then review and we will call you with those results. Important tips:  Avoid showering during the first 24 hours of wearing the monitor. Avoid excessive sweating to help maximize wear time. Do not submerge the device, no hot tubs, and no swimming pools. Keep any lotions or oils away from the patch. After 24 hours you may shower with the patch on. Take brief showers with your back facing the shower head.  Do not remove patch once it has been placed because that will interrupt data and decrease adhesive wear time. Push the button when you have any symptoms and write down what you were feeling. Once you have completed wearing your monitor, remove and place into box which has postage paid and place in your outgoing mailbox.  If for some reason you have misplaced your box then call our office and we can provide another box and/or mail it off for you.

## 2021-04-29 DIAGNOSIS — Q210 Ventricular septal defect: Secondary | ICD-10-CM

## 2021-04-29 DIAGNOSIS — I4892 Unspecified atrial flutter: Secondary | ICD-10-CM | POA: Diagnosis not present

## 2021-04-29 DIAGNOSIS — I10 Essential (primary) hypertension: Secondary | ICD-10-CM | POA: Diagnosis not present

## 2021-04-29 DIAGNOSIS — I4891 Unspecified atrial fibrillation: Secondary | ICD-10-CM | POA: Diagnosis not present

## 2021-06-01 ENCOUNTER — Encounter: Payer: Self-pay | Admitting: Cardiology

## 2021-07-28 ENCOUNTER — Ambulatory Visit (INDEPENDENT_AMBULATORY_CARE_PROVIDER_SITE_OTHER): Payer: Medicare Other | Admitting: Internal Medicine

## 2021-07-28 ENCOUNTER — Other Ambulatory Visit: Payer: Self-pay

## 2021-07-28 ENCOUNTER — Encounter: Payer: Self-pay | Admitting: Internal Medicine

## 2021-07-28 VITALS — BP 126/58 | HR 67 | Ht 72.0 in | Wt 214.0 lb

## 2021-07-28 DIAGNOSIS — I25118 Atherosclerotic heart disease of native coronary artery with other forms of angina pectoris: Secondary | ICD-10-CM

## 2021-07-28 DIAGNOSIS — I429 Cardiomyopathy, unspecified: Secondary | ICD-10-CM

## 2021-07-28 DIAGNOSIS — I5032 Chronic diastolic (congestive) heart failure: Secondary | ICD-10-CM

## 2021-07-28 DIAGNOSIS — Z01812 Encounter for preprocedural laboratory examination: Secondary | ICD-10-CM

## 2021-07-28 DIAGNOSIS — I4819 Other persistent atrial fibrillation: Secondary | ICD-10-CM

## 2021-07-28 DIAGNOSIS — Q210 Ventricular septal defect: Secondary | ICD-10-CM | POA: Diagnosis not present

## 2021-07-28 DIAGNOSIS — Z01818 Encounter for other preprocedural examination: Secondary | ICD-10-CM

## 2021-07-28 MED ORDER — LOSARTAN POTASSIUM 25 MG PO TABS: 12.5000 mg | ORAL_TABLET | Freq: Every day | ORAL | 1 refills | Status: DC

## 2021-07-28 MED ORDER — AMIODARONE HCL 200 MG PO TABS: 200.0000 mg | ORAL_TABLET | Freq: Every day | ORAL | 0 refills | Status: DC

## 2021-07-28 NOTE — Progress Notes (Signed)
Outpatient cardiology consultation Date: 07/28/2021  Primary Care Provider: Kathlyn Sacramento, MD [phone] [place] [address]  Chief Complaint: Shortness of breath and fatigue  HPI:  Aaron Jones is a 76 y.o. male with history of paroxysmal atrial fibrillation atrial fibrillation status post Maze procedure and left atrial appendage clipping, coronary artery disease status post single-vessel CABG (SVG-PDA), VSD status post open repair with Dacron patch (2014), paroxysmal atrial tachycardia, thoracic aortic aneurysm, hypertension, hyperlipidemia, type 2 diabetes mellitus, and obstructive sleep apnea, who has been referred by Dr. Quentin Ore for evaluation of VSD.  Aaron Jones reports that he has felt quite short of breath and fatigued since last summer.  He was noted to be in atrial fibrillation last fall but converted to sinus rhythm before undergoing TEE/DCCV.  Since that time, he notes that his energy has been a little bit better though he still feels tired with minimal activity; there is associated tightness in the chest.  He has not been using CPAP regularly despite being diagnosed with OSA due to problems keeping his mask on.  He has mild asymmetric leg edema that was exacerbated by mechanical injury last summer.  He has not had any recent palpitations nor lightheadedness.  He thinks that his current symptoms are somewhat reminiscent of what he experienced before his CABG and VSD repair in 2014.  --------------------------------------------------------------------------------------------------  Past Medical History:  Diagnosis Date   Atrial fibrillation with rapid ventricular response ([place]) 01/16/2013   a. 01/2013 s/p R & L Maze and LAA clipping in setting of VSD repair;  b. Prev on amiodarone;  c. CHA2DS2VASc = 4-->Apixaban;  c. 01/2013 Recurrent afib/flutter requiring DCCV.   CHF (congestive heart failure) (HCC)    Colon polyp    COPD (chronic obstructive pulmonary disease) ([place])     Coronary artery disease    a. 01/2013 s/p CABG x 1 (VG->PDA) in setting of VSD repair.   GERD (gastroesophageal reflux disease)    Hyperlipidemia    Hypertension    PCP- [place], phone; [phone]   Mitral valve disorder    Obesity    PAT (paroxysmal atrial tachycardia) ([place])    a. 02/2015 s/p DCCV;  b. 12/2016 recurrent PAT - unchanged with escalating BB dose (rates 106-107).   PSVT (paroxysmal supraventricular tachycardia) ([place])    a. 10/2011 Admitted to [facility] w/ SVT-->broke with IV dilt.   Recurrent Nephrolithiasis    Schatzki's ring    Sleep apnea    [facility]- in process of being evaluated now, states 12 yrs. ago was on CPAP but after having his deviated septum repaired, he had put the CPAP in storage. Pt. will get a new machine soon.    Thoracic aortic aneurysm    a. 01/2016 CTA chest: 4.0 cm Asc Ao; b. 01/2017 Echo: nl Ao root size.   Type II diabetes mellitus ([place])    Umbilical hernia    "not repaired" (01/16/2013)   VSD (ventricular septal defect) w/ persistent systolic murmur    a. 0/9233 s/p VSD closure (dacron patch);  b. 01/2017 Echo: EF 50-55%, no rwma, mild AS, mild MR, mod dil LA, dilated RV, sev dil RA, mild TR. PASP nl.   Past Surgical History:  Procedure Laterality Date   CARDIAC CATHETERIZATION  > 5 yr ago   Done at [facility], reportedly clean   CARDIAC CATHETERIZATION  11/03/2012   CARDIOVERSION N/A 02/12/2015   Procedure: CARDIOVERSION;  Surgeon: Thayer Headings, MD;  Location: [place];  Service:  Cardiovascular;  Laterality: N/A;   CARDIOVERSION N/A 04/27/2017   Procedure: CARDIOVERSION;  Surgeon: Evans Lance, MD;  Location: [place] CV LAB;  Service: Cardiovascular;  Laterality: N/A;   CLIPPING OF ATRIAL APPENDAGE N/A 01/07/2013   Procedure: CLIPPING OF ATRIAL APPENDAGE;  Surgeon: Grace Isaac, MD;  Location: [place];  Service: Open Heart Surgery;  Laterality: N/A;   COLONOSCOPY W/ POLYPECTOMY     COLONOSCOPY WITH PROPOFOL N/A 01/05/2021    Procedure: COLONOSCOPY WITH PROPOFOL;  Surgeon: Toledo, Benay Pike, MD;  Location: [facility] ENDOSCOPY;  Service: Gastroenterology;  Laterality: N/A;   CORONARY ARTERY BYPASS GRAFT N/A 01/07/2013   Procedure: CORONARY ARTERY BYPASS GRAFTING (CABG);  Surgeon: Grace Isaac, MD;  Location: [place];  Service: Open Heart Surgery;  Laterality: N/A;  Times1 using endoscopically harvested saphenous vein graft to the PDA   ESOPHAGOGASTRODUODENOSCOPY N/A 01/05/2021   Procedure: ESOPHAGOGASTRODUODENOSCOPY (EGD);  Surgeon: Toledo, Benay Pike, MD;  Location: [facility] ENDOSCOPY;  Service: Gastroenterology;  Laterality: N/A;  DM   EYE SURGERY Left 02/03/1989   "clipped muscle so it wouldn't be pulling up" (01/16/2013)   INTRAOPERATIVE TRANSESOPHAGEAL ECHOCARDIOGRAM N/A 01/07/2013   Procedure: INTRAOPERATIVE TRANSESOPHAGEAL ECHOCARDIOGRAM;  Surgeon: Grace Isaac, MD;  Location: [place];  Service: Open Heart Surgery;  Laterality: N/A;   LEFT AND RIGHT HEART CATHETERIZATION WITH CORONARY ANGIOGRAM N/A 11/20/2012   Procedure: LEFT AND RIGHT HEART CATHETERIZATION WITH CORONARY ANGIOGRAM;  Surgeon: Jolaine Artist, MD;  Location: [facility] CATH LAB;  Service: Cardiovascular;  Laterality: N/A;   MAZE N/A 01/07/2013   Procedure: MAZE;  Surgeon: Grace Isaac, MD;  Location: [place];  Service: Open Heart Surgery;  Laterality: N/A;   MULTIPLE EXTRACTIONS WITH ALVEOLOPLASTY N/A 12/11/2012   Procedure: Extraction of tooth #'s 4,1,6,6,06,30,16,01,09,32,35,57,32,20,25 wioth alveoloplasty and bialteral fibrous tuberosity reductions.;  Surgeon: Lenn Cal, DDS;  Location: [facility];  Service: Oral Surgery;  Laterality: N/A;   NASAL SEPTUM SURGERY  06/05/2002   sepfal deveation repair     TEE WITHOUT CARDIOVERSION N/A 11/21/2012   Procedure: TRANSESOPHAGEAL ECHOCARDIOGRAM (TEE);  Surgeon: Jolaine Artist, MD;  Location: [facility] ENDOSCOPY;  Service: Cardiovascular;  Laterality: N/A;   VSD REPAIR N/A 01/07/2013   Procedure: VENTRICULAR  SEPTAL DEFECT (VSD) REPAIR;  Surgeon: Grace Isaac, MD;  Location: [place];  Service: Open Heart Surgery;  Laterality: N/A;   VSD REPAIR       Recent CV Pertinent Labs: Lab Results  Component Value Date   CHOL 142 04/17/2013   CHOL 192 10/29/2011   HDL 41 04/17/2013   HDL 32 (L) 10/29/2011   LDLCALC 76 04/17/2013   LDLCALC 130 (H) 10/29/2011   TRIG 125 04/17/2013   TRIG 150 10/29/2011   CHOLHDL 3.5 04/17/2013   INR 1.1 02/02/2015   INR 1.0 10/29/2011   BNP 3,366 (H) 11/17/2012   K 4.8 12/07/2020   K 4.4 11/17/2012   MG 1.8 01/16/2013   MG 1.6 (L) 11/17/2012   BUN 14 12/07/2020   BUN 19 (H) 11/17/2012   CREATININE 1.27 12/07/2020   CREATININE 1.19 11/17/2012    Past medical and surgical history were reviewed and updated in EPIC.  Current Meds  Medication Sig   acetaminophen (TYLENOL) 500 MG tablet Take 1,000 mg by mouth daily as needed for moderate pain or headache.   atorvastatin (LIPITOR) 20 MG tablet Take 1 tablet (20 mg total) by mouth at bedtime.   ELIQUIS 5 MG TABS tablet TAKE 1 TABLET BY MOUTH TWICE  DAILY   furosemide (LASIX) 40 MG tablet TAKE 1 TABLET BY MOUTH EVERY DAY   glipiZIDE (GLUCOTROL) 10 MG tablet Take 5-10 mg by mouth See admin instructions. 10mg  in the am and 5mg  in the pm   Glucose Blood (BLOOD GLUCOSE TEST STRIPS) STRP 1 strip by In Vitro route 2 (two) times daily.   metFORMIN (GLUCOPHAGE) 1000 MG tablet Take 1,000 mg by mouth 2 (two) times daily.   metoprolol succinate (TOPROL-XL) 100 MG 24 hr tablet Take 1 tablet (100 mg total) by mouth at bedtime. Take with or immediately following a meal.   omeprazole (PRILOSEC) 40 MG capsule Take 40 mg by mouth daily before breakfast.    sertraline (ZOLOFT) 50 MG tablet Take 50 mg by mouth daily.    Allergies: Biaxin [clarithromycin]  Social History   Tobacco Use   Smoking status: Never   Smokeless tobacco: Never  Vaping Use   Vaping Use: Never used  Substance Use Topics   Alcohol use: No   Drug  use: No    Family History  Problem Relation Age of Onset   Cancer Mother    Diabetes Mother     Review of Systems: A 12-system review of systems was performed and was negative except as noted in the HPI.  --------------------------------------------------------------------------------------------------  Physical Exam: BP (!) 126/58 (BP Location: Left Arm, Patient Position: Sitting, Cuff Size: Large)    Pulse 67    Ht 6' (1.829 m)    Wt 214 lb (97.1 kg)    SpO2 98%    BMI 29.02 kg/m   General:  NAD.  Accompanied by his sister-in-law. Neck: No JVD or HJR. Lungs: Clear to auscultation bilaterally without wheezes or crackles. Heart: Regular rate and rhythm with 3/6 pansystolic murmur heard across the precordium but loudest at the left lower sternal border.  No rubs or gallops. Abdomen: Soft, nontender, nondistended. Extremities: 1+ left and trace right pretibial edema.  EKG: Normal sinus rhythm with bifascicular block (LAFB and RBBB).  Compared with prior tracing from 04/27/2021, sinus rhythm has replaced atrial fibrillation.  Lab Results  Component Value Date   WBC 5.9 09/22/2020   HGB 10.2 (L) 09/22/2020   HCT 32.7 (L) 09/22/2020   MCV 83 09/22/2020   PLT 246 09/22/2020    Lab Results  Component Value Date   NA 140 12/07/2020   K 4.8 12/07/2020   CL 101 12/07/2020   CO2 30 (H) 12/07/2020   BUN 14 12/07/2020   CREATININE 1.27 12/07/2020   GLUCOSE 120 (H) 12/07/2020   ALT 8 12/07/2020    Lab Results  Component Value Date   CHOL 142 04/17/2013   HDL 41 04/17/2013   LDLCALC 76 04/17/2013   TRIG 125 04/17/2013   CHOLHDL 3.5 04/17/2013    --------------------------------------------------------------------------------------------------  ASSESSMENT AND PLAN: Ventricular septal defect: Ms. Jones is status post perimembranous VSD repair with a dacryon patch by Dr. Servando Snare in 2014.  I have reviewed his most recent echocardiogram from 12/28/2020, demonstrating color  Doppler flow across the ventricular septum.  Associated murmur is appreciated on exam today.  There has also been decline in LVEF from 06/24/2018 to 12/22/2020.  It is possible that the VSD is contributing to his symptoms of fatigue, dyspnea, and chest tightness.  We have discussed further evaluation options including TEE, cardiac MRI (if safe in the setting of prior LAA clipping), and cardiac catheterization.  We have agreed to begin with a right and left heart catheterization (including shunt run) to better  characterize the hemodynamic effects of the VSD as well as evaluate for other potential causes of his symptoms such as worsening CAD and/or decompensated heart failure.  If a significant shunt fraction is identified, consultation with structural heart team will need to be considered to determine if open versus percutaneous repair needs to be considered.  Coronary artery disease with stable angina: Aaron Jones reports several months of fatigue as well as tightness in his chest with mild activity.  This could be due to a number of factors, but given his history of single-vessel CABG at the time of VSD repair, graft failure or progression of native CAD in the setting of uncontrolled diabetes is certainly a concern.  We will proceed with right and left heart catheterization and possible PCI, as outlined above.  We will defer aspirin at this time given anticoagulation with apixaban.  However, when apixaban is held in preparation for catheterization, Aaron Jones will need to take aspirin 81 mg daily.  Cardiomyopathy: Aaron Jones was noted to have mildly reduced LVEF on his last echo in 12/2020.  Etiology could be both ischemic and nonischemic given his history.  We will proceed with catheterization for further assessment.  We have agreed to add losartan 12.5 mg daily.  We will check a BMP today.  Continue metoprolol succinate 100 mg daily and furosemide 40 mg daily.  Persistent atrial fibrillation: Aaron Jones is  maintaining sinus rhythm.  He just ran out of his amiodarone prescription, which we will refill today.  We will continue metoprolol succinate 100 mg daily as well as long-term anticoagulation with metoprolol succinate.  Shared Decision Making/Informed Consent The risks [stroke (1 in 1000), death (1 in 1000), kidney failure [usually temporary] (1 in 500), bleeding (1 in 200), allergic reaction [possibly serious] (1 in 200)], benefits (diagnostic support and management of coronary artery disease) and alternatives of a cardiac catheterization were discussed in detail with Aaron Jones and he is willing to proceed.  Follow-up: Return to clinic in 3 weeks.  Greater than 60 minutes were spent on this encounter, including reviewing prior imaging and operative details related to his complex cardiac history.  Nelva Bush, MD 07/28/2021 2:46 PM

## 2021-07-28 NOTE — Patient Instructions (Signed)
Medication Instructions:   Your physician has recommended you make the following change in your medication:   START Amiodarone 200 mg daily   START Losartan 12.5 mg daily   *If you need a refill on your cardiac medications before your next appointment, please call your pharmacy*   Lab Work:  Today: BMET, CBC  If you have labs (blood work) drawn today and your tests are completely normal, you will receive your results only by: [place] (if you have MyChart) OR A paper copy in the mail If you have any lab test that is abnormal or we need to change your treatment, we will call you to review the results.   Testing/Procedures:  You are scheduled for a Cardiac Catheterization on Tuesday, February 28 with Dr. Harrell Gave End.  1. Please arrive at the medical mall at [facility] at 9:30 AM (This time is one hour before your procedure to ensure your preparation). Free valet parking service is available.   Special note: Every effort is made to have your procedure done on time. Please understand that emergencies sometimes delay scheduled procedures.  2. Diet: Do not eat solid foods after midnight.  The patient may have clear liquids until 5am upon the day of the procedure.  3. Labs: Completed today in office  4. Medication instructions in preparation for your procedure:  HOLD Eliquis 2 days prior to procedure:   - Take last dose 07/30/21    - Take Aspirin 81 mg daily while you are holding Eliquis   - Make sure that you take your Aspirin the morning of procedure  HOLD Furosemide (Lasix) the morning of procedure.  HOLD Glipizide and Metformin the morning of procedure  Do not take Diabetes Med Glucophage (Metformin) on the day of the procedure and HOLD 48 HOURS AFTER THE PROCEDURE.  On the morning of your procedure, take your Aspirin and any morning medicines NOT listed above.  You may use sips of water.  5. Plan for one night stay--bring personal belongings. 6. Bring a current list  of your medications and current insurance cards. 7. You MUST have a responsible person to drive you home. 8. Someone MUST be with you the first 24 hours after you arrive home or your discharge will be delayed. 9. Please wear clothes that are easy to get on and off and wear slip-on shoes.  Thank you for allowing [place] to care for you!   -- [place] [facility]    Follow-Up: At [facility], you and your health needs are our priority.  As part of our continuing mission to provide you with exceptional heart care, we have created designated Provider Care Teams.  These Care Teams include your primary Cardiologist (physician) and Advanced Practice Providers (APPs -  Physician Assistants and Nurse Practitioners) who all work together to provide you with the care you need, when you need it.  We recommend signing up for the patient portal called "MyChart".  Sign up information is provided on this After Visit Summary.  MyChart is used to connect with patients for Virtual Visits (Telemedicine).  Patients are able to view lab/test results, encounter notes, upcoming appointments, etc.  Non-urgent messages can be sent to your provider as well.   To learn more about what you can do with MyChart, go to [email or web address].    Your next appointment:   3 week(s)  The format for your next appointment:   In Person  Provider:   You may see Dr. Harrell Gave End or  one of the following Advanced Practice Providers on your designated Care Team:   Murray Hodgkins, NP Christell Faith, PA-C Cadence Kathlen Mody, [place]

## 2021-07-28 NOTE — H&P (View-Only) (Signed)
Outpatient cardiology consultation Date: 07/28/2021  Primary Care Provider: Kathlyn Sacramento, MD [phone] [id number]  Chief Complaint: Shortness of breath and fatigue  HPI:  Aaron Jones is a 76 y.o. male with history of paroxysmal atrial fibrillation atrial fibrillation status post Maze procedure and left atrial appendage clipping, coronary artery disease status post single-vessel CABG (SVG-PDA), VSD status post open repair with Dacron patch (2014), paroxysmal atrial tachycardia, thoracic aortic aneurysm, hypertension, hyperlipidemia, type 2 diabetes mellitus, and obstructive sleep apnea, who has been referred by Dr. Quentin Ore for evaluation of VSD.  Aaron Jones reports that he has felt quite short of breath and fatigued since last summer.  He was noted to be in atrial fibrillation last fall but converted to sinus rhythm before undergoing TEE/DCCV.  Since that time, he notes that his energy has been a little bit better though he still feels tired with minimal activity; there is associated tightness in the chest.  He has not been using CPAP regularly despite being diagnosed with OSA due to problems keeping his mask on.  He has mild asymmetric leg edema that was exacerbated by mechanical injury last summer.  He has not had any recent palpitations nor lightheadedness.  He thinks that his current symptoms are somewhat reminiscent of what he experienced before his CABG and VSD repair in 2014.  --------------------------------------------------------------------------------------------------  Past Medical History:  Diagnosis Date   Atrial fibrillation with rapid ventricular response ([place]) 01/16/2013   a. 01/2013 s/p R & L Maze and LAA clipping in setting of VSD repair;  b. Prev on amiodarone;  c. CHA2DS2VASc = 4-->Apixaban;  c. 01/2013 Recurrent afib/flutter requiring DCCV.   CHF (congestive heart failure) (HCC)    Colon polyp    COPD (chronic obstructive pulmonary disease) ([place])     Coronary artery disease    a. 01/2013 s/p CABG x 1 (VG->PDA) in setting of VSD repair.   GERD (gastroesophageal reflux disease)    Hyperlipidemia    Hypertension    PCP- [place], phone; [phone]   Mitral valve disorder    Obesity    PAT (paroxysmal atrial tachycardia) ([place])    a. 02/2015 s/p DCCV;  b. 12/2016 recurrent PAT - unchanged with escalating BB dose (rates 106-107).   PSVT (paroxysmal supraventricular tachycardia) ([place])    a. 10/2011 Admitted to [facility] w/ SVT-->broke with IV dilt.   Recurrent Nephrolithiasis    Schatzki's ring    Sleep apnea    [facility]- in process of being evaluated now, states 12 yrs. ago was on CPAP but after having his deviated septum repaired, he had put the CPAP in storage. Pt. will get a new machine soon.    Thoracic aortic aneurysm    a. 01/2016 CTA chest: 4.0 cm Asc Ao; b. 01/2017 Echo: nl Ao root size.   Type II diabetes mellitus ([place])    Umbilical hernia    "not repaired" (01/16/2013)   VSD (ventricular septal defect) w/ persistent systolic murmur    a. 11/2829 s/p VSD closure (dacron patch);  b. 01/2017 Echo: EF 50-55%, no rwma, mild AS, mild MR, mod dil LA, dilated RV, sev dil RA, mild TR. PASP nl.   Past Surgical History:  Procedure Laterality Date   CARDIAC CATHETERIZATION  > 5 yr ago   Done at [facility], reportedly clean   CARDIAC CATHETERIZATION  11/03/2012   CARDIOVERSION N/A 02/12/2015   Procedure: CARDIOVERSION;  Surgeon: Thayer Headings, MD;  Location: [place];  Service:  Cardiovascular;  Laterality: N/A;   CARDIOVERSION N/A 04/27/2017   Procedure: CARDIOVERSION;  Surgeon: Evans Lance, MD;  Location: [place] CV LAB;  Service: Cardiovascular;  Laterality: N/A;   CLIPPING OF ATRIAL APPENDAGE N/A 01/07/2013   Procedure: CLIPPING OF ATRIAL APPENDAGE;  Surgeon: Grace Isaac, MD;  Location: [place];  Service: Open Heart Surgery;  Laterality: N/A;   COLONOSCOPY W/ POLYPECTOMY     COLONOSCOPY WITH PROPOFOL N/A 01/05/2021    Procedure: COLONOSCOPY WITH PROPOFOL;  Surgeon: Toledo, Benay Pike, MD;  Location: [facility] ENDOSCOPY;  Service: Gastroenterology;  Laterality: N/A;   CORONARY ARTERY BYPASS GRAFT N/A 01/07/2013   Procedure: CORONARY ARTERY BYPASS GRAFTING (CABG);  Surgeon: Grace Isaac, MD;  Location: [place];  Service: Open Heart Surgery;  Laterality: N/A;  Times1 using endoscopically harvested saphenous vein graft to the PDA   ESOPHAGOGASTRODUODENOSCOPY N/A 01/05/2021   Procedure: ESOPHAGOGASTRODUODENOSCOPY (EGD);  Surgeon: Toledo, Benay Pike, MD;  Location: [facility] ENDOSCOPY;  Service: Gastroenterology;  Laterality: N/A;  DM   EYE SURGERY Left 02/03/1989   "clipped muscle so it wouldn't be pulling up" (01/16/2013)   INTRAOPERATIVE TRANSESOPHAGEAL ECHOCARDIOGRAM N/A 01/07/2013   Procedure: INTRAOPERATIVE TRANSESOPHAGEAL ECHOCARDIOGRAM;  Surgeon: Grace Isaac, MD;  Location: [place];  Service: Open Heart Surgery;  Laterality: N/A;   LEFT AND RIGHT HEART CATHETERIZATION WITH CORONARY ANGIOGRAM N/A 11/20/2012   Procedure: LEFT AND RIGHT HEART CATHETERIZATION WITH CORONARY ANGIOGRAM;  Surgeon: Jolaine Artist, MD;  Location: [facility] CATH LAB;  Service: Cardiovascular;  Laterality: N/A;   MAZE N/A 01/07/2013   Procedure: MAZE;  Surgeon: Grace Isaac, MD;  Location: [place];  Service: Open Heart Surgery;  Laterality: N/A;   MULTIPLE EXTRACTIONS WITH ALVEOLOPLASTY N/A 12/11/2012   Procedure: Extraction of tooth #'s 4,7,4,2,59,56,38,75,64,33,29,51,88,41,66 wioth alveoloplasty and bialteral fibrous tuberosity reductions.;  Surgeon: Lenn Cal, DDS;  Location: [facility];  Service: Oral Surgery;  Laterality: N/A;   NASAL SEPTUM SURGERY  06/05/2002   sepfal deveation repair     TEE WITHOUT CARDIOVERSION N/A 11/21/2012   Procedure: TRANSESOPHAGEAL ECHOCARDIOGRAM (TEE);  Surgeon: Jolaine Artist, MD;  Location: [facility] ENDOSCOPY;  Service: Cardiovascular;  Laterality: N/A;   VSD REPAIR N/A 01/07/2013   Procedure: VENTRICULAR  SEPTAL DEFECT (VSD) REPAIR;  Surgeon: Grace Isaac, MD;  Location: [place];  Service: Open Heart Surgery;  Laterality: N/A;   VSD REPAIR       Recent CV Pertinent Labs: Lab Results  Component Value Date   CHOL 142 04/17/2013   CHOL 192 10/29/2011   HDL 41 04/17/2013   HDL 32 (L) 10/29/2011   LDLCALC 76 04/17/2013   LDLCALC 130 (H) 10/29/2011   TRIG 125 04/17/2013   TRIG 150 10/29/2011   CHOLHDL 3.5 04/17/2013   INR 1.1 02/02/2015   INR 1.0 10/29/2011   BNP 3,366 (H) 11/17/2012   K 4.8 12/07/2020   K 4.4 11/17/2012   MG 1.8 01/16/2013   MG 1.6 (L) 11/17/2012   BUN 14 12/07/2020   BUN 19 (H) 11/17/2012   CREATININE 1.27 12/07/2020   CREATININE 1.19 11/17/2012    Past medical and surgical history were reviewed and updated in EPIC.  Current Meds  Medication Sig   acetaminophen (TYLENOL) 500 MG tablet Take 1,000 mg by mouth daily as needed for moderate pain or headache.   atorvastatin (LIPITOR) 20 MG tablet Take 1 tablet (20 mg total) by mouth at bedtime.   ELIQUIS 5 MG TABS tablet TAKE 1 TABLET BY MOUTH TWICE  DAILY   furosemide (LASIX) 40 MG tablet TAKE 1 TABLET BY MOUTH EVERY DAY   glipiZIDE (GLUCOTROL) 10 MG tablet Take 5-10 mg by mouth See admin instructions. 10mg  in the am and 5mg  in the pm   Glucose Blood (BLOOD GLUCOSE TEST STRIPS) STRP 1 strip by In Vitro route 2 (two) times daily.   metFORMIN (GLUCOPHAGE) 1000 MG tablet Take 1,000 mg by mouth 2 (two) times daily.   metoprolol succinate (TOPROL-XL) 100 MG 24 hr tablet Take 1 tablet (100 mg total) by mouth at bedtime. Take with or immediately following a meal.   omeprazole (PRILOSEC) 40 MG capsule Take 40 mg by mouth daily before breakfast.    sertraline (ZOLOFT) 50 MG tablet Take 50 mg by mouth daily.    Allergies: Biaxin [clarithromycin]  Social History   Tobacco Use   Smoking status: Never   Smokeless tobacco: Never  Vaping Use   Vaping Use: Never used  Substance Use Topics   Alcohol use: No   Drug  use: No    Family History  Problem Relation Age of Onset   Cancer Mother    Diabetes Mother     Review of Systems: A 12-system review of systems was performed and was negative except as noted in the HPI.  --------------------------------------------------------------------------------------------------  Physical Exam: BP (!) 126/58 (BP Location: Left Arm, Patient Position: Sitting, Cuff Size: Large)    Pulse 67    Ht 6' (1.829 m)    Wt 214 lb (97.1 kg)    SpO2 98%    BMI 29.02 kg/m   General:  NAD.  Accompanied by his sister-in-law. Neck: No JVD or HJR. Lungs: Clear to auscultation bilaterally without wheezes or crackles. Heart: Regular rate and rhythm with 3/6 pansystolic murmur heard across the precordium but loudest at the left lower sternal border.  No rubs or gallops. Abdomen: Soft, nontender, nondistended. Extremities: 1+ left and trace right pretibial edema.  EKG: Normal sinus rhythm with bifascicular block (LAFB and RBBB).  Compared with prior tracing from 04/27/2021, sinus rhythm has replaced atrial fibrillation.  Lab Results  Component Value Date   WBC 5.9 09/22/2020   HGB 10.2 (L) 09/22/2020   HCT 32.7 (L) 09/22/2020   MCV 83 09/22/2020   PLT 246 09/22/2020    Lab Results  Component Value Date   NA 140 12/07/2020   K 4.8 12/07/2020   CL 101 12/07/2020   CO2 30 (H) 12/07/2020   BUN 14 12/07/2020   CREATININE 1.27 12/07/2020   GLUCOSE 120 (H) 12/07/2020   ALT 8 12/07/2020    Lab Results  Component Value Date   CHOL 142 04/17/2013   HDL 41 04/17/2013   LDLCALC 76 04/17/2013   TRIG 125 04/17/2013   CHOLHDL 3.5 04/17/2013    --------------------------------------------------------------------------------------------------  ASSESSMENT AND PLAN: Ventricular septal defect: Ms. Jones is status post perimembranous VSD repair with a dacryon patch by Dr. Servando Snare in 2014.  I have reviewed his most recent echocardiogram from 12/28/2020, demonstrating color  Doppler flow across the ventricular septum.  Associated murmur is appreciated on exam today.  There has also been decline in LVEF from 06/24/2018 to 12/22/2020.  It is possible that the VSD is contributing to his symptoms of fatigue, dyspnea, and chest tightness.  We have discussed further evaluation options including TEE, cardiac MRI (if safe in the setting of prior LAA clipping), and cardiac catheterization.  We have agreed to begin with a right and left heart catheterization (including shunt run) to better  characterize the hemodynamic effects of the VSD as well as evaluate for other potential causes of his symptoms such as worsening CAD and/or decompensated heart failure.  If a significant shunt fraction is identified, consultation with structural heart team will need to be considered to determine if open versus percutaneous repair needs to be considered.  Coronary artery disease with stable angina: Aaron Jones reports several months of fatigue as well as tightness in his chest with mild activity.  This could be due to a number of factors, but given his history of single-vessel CABG at the time of VSD repair, graft failure or progression of native CAD in the setting of uncontrolled diabetes is certainly a concern.  We will proceed with right and left heart catheterization and possible PCI, as outlined above.  We will defer aspirin at this time given anticoagulation with apixaban.  However, when apixaban is held in preparation for catheterization, Aaron Jones will need to take aspirin 81 mg daily.  Cardiomyopathy: Aaron Jones was noted to have mildly reduced LVEF on his last echo in 12/2020.  Etiology could be both ischemic and nonischemic given his history.  We will proceed with catheterization for further assessment.  We have agreed to add losartan 12.5 mg daily.  We will check a BMP today.  Continue metoprolol succinate 100 mg daily and furosemide 40 mg daily.  Persistent atrial fibrillation: Aaron Jones is  maintaining sinus rhythm.  He just ran out of his amiodarone prescription, which we will refill today.  We will continue metoprolol succinate 100 mg daily as well as long-term anticoagulation with metoprolol succinate.  Shared Decision Making/Informed Consent The risks [stroke (1 in 1000), death (1 in 1000), kidney failure [usually temporary] (1 in 500), bleeding (1 in 200), allergic reaction [possibly serious] (1 in 200)], benefits (diagnostic support and management of coronary artery disease) and alternatives of a cardiac catheterization were discussed in detail with Aaron Jones and he is willing to proceed.  Follow-up: Return to clinic in 3 weeks.  Greater than 60 minutes were spent on this encounter, including reviewing prior imaging and operative details related to his complex cardiac history.  Nelva Bush, MD 07/28/2021 2:46 PM

## 2021-07-29 ENCOUNTER — Encounter: Payer: Self-pay | Admitting: Internal Medicine

## 2021-07-29 DIAGNOSIS — I429 Cardiomyopathy, unspecified: Secondary | ICD-10-CM | POA: Insufficient documentation

## 2021-07-29 DIAGNOSIS — Z01812 Encounter for preprocedural laboratory examination: Secondary | ICD-10-CM | POA: Insufficient documentation

## 2021-07-29 DIAGNOSIS — I4819 Other persistent atrial fibrillation: Secondary | ICD-10-CM | POA: Insufficient documentation

## 2021-07-29 LAB — BASIC METABOLIC PANEL
BUN/Creatinine Ratio: 13 (ref 10–24)
BUN: 14 mg/dL (ref 8–27)
CO2: 22 mmol/L (ref 20–29)
Calcium: 9.4 mg/dL (ref 8.6–10.2)
Chloride: 101 mmol/L (ref 96–106)
Creatinine, Ser: 1.05 mg/dL (ref 0.76–1.27)
Glucose: 178 mg/dL — ABNORMAL HIGH (ref 70–99)
Potassium: 5 mmol/L (ref 3.5–5.2)
Sodium: 138 mmol/L (ref 134–144)
eGFR: 75 mL/min/{1.73_m2} (ref >59)

## 2021-07-29 LAB — CBC
Hematocrit: 36.5 % — ABNORMAL LOW (ref 37.5–51.0)
Hemoglobin: 12 g/dL — ABNORMAL LOW (ref 13.0–17.7)
MCH: 28.2 pg (ref 26.6–33.0)
MCHC: 32.9 g/dL (ref 31.5–35.7)
MCV: 86 fL (ref 79–97)
Platelets: 249 10*3/uL (ref 150–450)
RBC: 4.26 x10E6/uL (ref 4.14–5.80)
RDW: 14.6 % (ref 11.6–15.4)
WBC: 6.3 10*3/uL (ref 3.4–10.8)

## 2021-08-02 ENCOUNTER — Ambulatory Visit
Admission: RE | Admit: 2021-08-02 | Discharge: 2021-08-02 | Disposition: A | Discharge status: Home / Self Care | Payer: Medicare Other | Attending: Internal Medicine | Admitting: Internal Medicine

## 2021-08-02 ENCOUNTER — Encounter
Admission: RE | Disposition: A | Discharge status: Home / Self Care | Payer: Self-pay | Source: Home / Self Care | Attending: Internal Medicine

## 2021-08-02 DIAGNOSIS — E785 Hyperlipidemia, unspecified: Secondary | ICD-10-CM | POA: Insufficient documentation

## 2021-08-02 DIAGNOSIS — E119 Type 2 diabetes mellitus without complications: Secondary | ICD-10-CM | POA: Insufficient documentation

## 2021-08-02 DIAGNOSIS — Z79899 Other long term (current) drug therapy: Secondary | ICD-10-CM | POA: Diagnosis not present

## 2021-08-02 DIAGNOSIS — I25118 Atherosclerotic heart disease of native coronary artery with other forms of angina pectoris: Secondary | ICD-10-CM | POA: Diagnosis not present

## 2021-08-02 DIAGNOSIS — I272 Pulmonary hypertension, unspecified: Secondary | ICD-10-CM | POA: Insufficient documentation

## 2021-08-02 DIAGNOSIS — I4819 Other persistent atrial fibrillation: Secondary | ICD-10-CM | POA: Diagnosis not present

## 2021-08-02 DIAGNOSIS — I429 Cardiomyopathy, unspecified: Secondary | ICD-10-CM

## 2021-08-02 DIAGNOSIS — I110 Hypertensive heart disease with heart failure: Secondary | ICD-10-CM | POA: Diagnosis not present

## 2021-08-02 DIAGNOSIS — I509 Heart failure, unspecified: Secondary | ICD-10-CM

## 2021-08-02 DIAGNOSIS — Q210 Ventricular septal defect: Secondary | ICD-10-CM

## 2021-08-02 DIAGNOSIS — Z7901 Long term (current) use of anticoagulants: Secondary | ICD-10-CM | POA: Insufficient documentation

## 2021-08-02 DIAGNOSIS — I25708 Atherosclerosis of coronary artery bypass graft(s), unspecified, with other forms of angina pectoris: Secondary | ICD-10-CM | POA: Diagnosis not present

## 2021-08-02 DIAGNOSIS — I428 Other cardiomyopathies: Secondary | ICD-10-CM | POA: Diagnosis not present

## 2021-08-02 DIAGNOSIS — G4733 Obstructive sleep apnea (adult) (pediatric): Secondary | ICD-10-CM | POA: Diagnosis not present

## 2021-08-02 DIAGNOSIS — I712 Thoracic aortic aneurysm, without rupture, unspecified: Secondary | ICD-10-CM | POA: Diagnosis not present

## 2021-08-02 HISTORY — PX: RIGHT/LEFT HEART CATH AND CORONARY ANGIOGRAPHY: CATH118266

## 2021-08-02 LAB — GLUCOSE, CAPILLARY: Glucose-Capillary: 80 mg/dL (ref 70–99)

## 2021-08-02 SURGERY — RIGHT/LEFT HEART CATH AND CORONARY ANGIOGRAPHY
Anesthesia: Moderate Sedation

## 2021-08-02 MED ORDER — ACETAMINOPHEN 325 MG PO TABS: 650.0000 mg | ORAL_TABLET | ORAL | Status: DC | PRN

## 2021-08-02 MED ORDER — HEPARIN SODIUM (PORCINE) 1000 UNIT/ML IJ SOLN
INTRAMUSCULAR | Status: DC | PRN
  Administered 2021-08-02: 5000 [IU] via INTRAVENOUS

## 2021-08-02 MED ORDER — ISOSORBIDE MONONITRATE ER 30 MG PO TB24: 15.0000 mg | ORAL_TABLET | Freq: Every day | ORAL | 5 refills | Status: AC

## 2021-08-02 MED ORDER — SODIUM CHLORIDE 0.9% FLUSH: 3.0000 mL | INTRAVENOUS | Status: DC | PRN

## 2021-08-02 MED ORDER — FUROSEMIDE 40 MG PO TABS: 40.0000 mg | ORAL_TABLET | Freq: Two times a day (BID) | ORAL | 5 refills | Status: DC

## 2021-08-02 MED ORDER — SODIUM CHLORIDE 0.9% FLUSH: 3.0000 mL | Freq: Two times a day (BID) | INTRAVENOUS | Status: DC

## 2021-08-02 MED ORDER — IOHEXOL 300 MG/ML  SOLN
INTRAMUSCULAR | Status: DC | PRN
  Administered 2021-08-02: 54 mL

## 2021-08-02 MED ORDER — HEPARIN SODIUM (PORCINE) 1000 UNIT/ML IJ SOLN
INTRAMUSCULAR | Status: AC
  Filled 2021-08-02: qty 10

## 2021-08-02 MED ORDER — MIDAZOLAM HCL 2 MG/2ML IJ SOLN
INTRAMUSCULAR | Status: DC | PRN
  Administered 2021-08-02: 1 mg via INTRAVENOUS

## 2021-08-02 MED ORDER — ONDANSETRON HCL 4 MG/2ML IJ SOLN: 4.0000 mg | Freq: Four times a day (QID) | INTRAMUSCULAR | Status: DC | PRN

## 2021-08-02 MED ORDER — HEPARIN (PORCINE) IN NACL 1000-0.9 UT/500ML-% IV SOLN
INTRAVENOUS | Status: AC
  Filled 2021-08-02: qty 1000

## 2021-08-02 MED ORDER — HYDRALAZINE HCL 20 MG/ML IJ SOLN: 10.0000 mg | INTRAMUSCULAR | Status: DC | PRN

## 2021-08-02 MED ORDER — LABETALOL HCL 5 MG/ML IV SOLN: 10.0000 mg | INTRAVENOUS | Status: DC | PRN

## 2021-08-02 MED ORDER — FENTANYL CITRATE (PF) 100 MCG/2ML IJ SOLN
INTRAMUSCULAR | Status: DC | PRN
Start: 2021-08-02 — End: 2021-08-02
  Administered 2021-08-02: 25 ug via INTRAVENOUS

## 2021-08-02 MED ORDER — SODIUM CHLORIDE 0.9 % IV SOLN: INTRAVENOUS | Status: DC

## 2021-08-02 MED ORDER — LIDOCAINE HCL 1 % IJ SOLN
INTRAMUSCULAR | Status: AC
  Filled 2021-08-02: qty 20

## 2021-08-02 MED ORDER — HEPARIN (PORCINE) IN NACL 1000-0.9 UT/500ML-% IV SOLN
INTRAVENOUS | Status: DC | PRN
  Administered 2021-08-02: 1000 mL

## 2021-08-02 MED ORDER — MIDAZOLAM HCL 2 MG/2ML IJ SOLN
INTRAMUSCULAR | Status: AC
  Filled 2021-08-02: qty 2

## 2021-08-02 MED ORDER — LIDOCAINE HCL (PF) 1 % IJ SOLN
INTRAMUSCULAR | Status: DC | PRN
  Administered 2021-08-02 (×2): 2 mL

## 2021-08-02 MED ORDER — VERAPAMIL HCL 2.5 MG/ML IV SOLN
INTRAVENOUS | Status: DC | PRN
  Administered 2021-08-02: 2.5 mg via INTRA_ARTERIAL

## 2021-08-02 MED ORDER — VERAPAMIL HCL 2.5 MG/ML IV SOLN
INTRAVENOUS | Status: AC
  Filled 2021-08-02: qty 2

## 2021-08-02 MED ORDER — ASPIRIN 81 MG PO CHEW: 81.0000 mg | CHEWABLE_TABLET | ORAL | Status: DC

## 2021-08-02 MED ORDER — SODIUM CHLORIDE 0.9 % IV SOLN: 250.0000 mL | INTRAVENOUS | Status: DC | PRN

## 2021-08-02 MED ORDER — FENTANYL CITRATE (PF) 100 MCG/2ML IJ SOLN
INTRAMUSCULAR | Status: AC
  Filled 2021-08-02: qty 2

## 2021-08-02 SURGICAL SUPPLY — 13 items
CATH 5F 110X4 TIG (CATHETERS) ×1 IMPLANT
CATH BALLN WEDGE 5F 110CM (CATHETERS) ×1 IMPLANT
DEVICE RAD TR BAND REGULAR (VASCULAR PRODUCTS) ×1 IMPLANT
DRAPE BRACHIAL (DRAPES) ×2 IMPLANT
GLIDESHEATH SLEND SS 6F .021 (SHEATH) ×1 IMPLANT
GUIDEWIRE EMER 3M J .025X150CM (WIRE) ×1 IMPLANT
GUIDEWIRE INQWIRE 1.5J.035X260 (WIRE) IMPLANT
INQWIRE 1.5J .035X260CM (WIRE) ×2
PACK CARDIAC CATH (CUSTOM PROCEDURE TRAY) ×3 IMPLANT
PROTECTION STATION PRESSURIZED (MISCELLANEOUS) ×2
SET ATX SIMPLICITY (MISCELLANEOUS) ×1 IMPLANT
SHEATH GLIDE SLENDER 4/5FR (SHEATH) ×1 IMPLANT
STATION PROTECTION PRESSURIZED (MISCELLANEOUS) IMPLANT

## 2021-08-02 NOTE — Brief Op Note (Signed)
BRIEF CARDIAC CATHETERIZATION NOTE  08/02/2021  12:14 PM  PATIENT:  Aaron Jones  76 y.o. male  PRE-OPERATIVE DIAGNOSIS:  VSD, cardiomyopathy, and stable angina  POST-OPERATIVE DIAGNOSIS:  Same  PROCEDURE:  Procedure(s): RIGHT/LEFT HEART CATH AND CORONARY ANGIOGRAPHY (N/A)  SURGEON:  Surgeon(s) and Role:    * Karlos Scadden, MD - Primary  FINDINGS: Moderate-severe but non-critical disease involving ostial D1, ramus intermedius, and rPDA. Widely patent SVG-rPDA. Mildly elevated left heart filling pressures with prominent V-waves noted on PCWP tracing. Severely elevated right heart filling pressures. Moderate pulmonary hypertension. Low normal to mildly reduced Fick cardiac output/index.  RECOMMENDATIONS: Escalate diuresis. Add Imdur for antianginal therapy. Increase GDMT for NICM as tolerated. Continue monitoring of VSD; shunting does not appear to be significant at this time.  Nelva Bush, MD [facility]

## 2021-08-02 NOTE — Interval H&P Note (Signed)
History and Physical Interval Note:  08/02/2021 11:14 AM  [name]  has presented today for surgery, with the diagnosis of VSD and heart failure.  The various methods of treatment have been discussed with the patient and family. After consideration of risks, benefits and other options for treatment, the patient has consented to  Procedure(s): RIGHT/LEFT HEART CATH AND CORONARY ANGIOGRAPHY (N/A) as a surgical intervention.  The patient's history has been reviewed, patient examined, no change in status, stable for surgery.  I have reviewed the patient's chart and labs.  Questions were answered to the patient's satisfaction.    Cath Lab Visit (complete for each Cath Lab visit)  Clinical Evaluation Leading to the Procedure:   ACS: No.  Non-ACS:    Anginal/Heart Failure Classification: CCS III  Anti-ischemic medical therapy: Minimal Therapy (1 class of medications)  Non-Invasive Test Results: No non-invasive testing performed  Prior CABG: Previous CABG  Dortha Neighbors

## 2021-08-03 ENCOUNTER — Encounter: Payer: Self-pay | Admitting: Internal Medicine

## 2021-08-30 ENCOUNTER — Ambulatory Visit: Payer: Medicare Other | Admitting: Medical

## 2021-09-07 NOTE — Progress Notes (Signed)
?Cardiology Office Note:   ? ?Date:  09/08/2021  ? ?ID:  Aaron Jones, DOB 03-22-1946, MRN [id number] ? ?PCP:  Aaron Sacramento, MD  ?[facility] Cardiologist:  Aaron Bush, MD  ?[facility] Electrophysiologist:  Aaron Peru, MD  ? ?Referring MD: Aaron Sacramento, MD  ? ?Chief Complaint: follow-up cardiac cath ? ?History of Present Illness:   ? ?Aaron Jones is a 76 y.o. male with a hx of paroxysmal Afib s/p MAZE procedure and left atrial appendage clipping, CAD s/p CABGx1 (SVG-PDA), VSD s/p open repair with Dacron patch, paroxysmal atrial tachycardia, thoracic aortic anuerysm, HTN, HLD, DM2, OSA who presents for cath follow-up.  ? ?He was previously referred by EP for VSD. He was seen 07/28/21 reporting SOB and fatigue. He was set up for cardiac cath.  ? ?Catheterization showed moderate to severe but non-critical disease involving ostial D1, rmaus intermedius, and rPDA, patent SVG-rPDA, mildly elevated left heart filling pressures, severely elevated right heart pressures, moderate pulmonary HTN, low normal reduced CO, no shunting. Imdut was added. Lasix was increased to '40mg'$  daily.  ? ?Today, the cardiac cath was reviewed. He reports lower leg edema is better. Breathing has also improved. He is back taking Eliquis. He is in NSR today. No chest pain, SOB, LLE, orthopnea, pnd.  ? ? ?Past Medical History:  ?Diagnosis Date  ? Atrial fibrillation with rapid ventricular response ([place]) 01/16/2013  ? a. 01/2013 s/p R & L Maze and LAA clipping in setting of VSD repair;  b. Prev on amiodarone;  c. CHA2DS2VASc = 4-->Apixaban;  c. 01/2013 Recurrent afib/flutter requiring DCCV.  ? CHF (congestive heart failure) ([place])   ? Colon polyp   ? COPD (chronic obstructive pulmonary disease) ([place])   ? Coronary artery disease   ? a. 01/2013 s/p CABG x 1 (VG->PDA) in setting of VSD repair.  ? GERD (gastroesophageal reflux disease)   ? Hyperlipidemia   ? Hypertension   ? PCP- Aaron Jones, phone; [phone]  ? Mitral valve  disorder   ? Obesity   ? PAT (paroxysmal atrial tachycardia) ([place])   ? a. 02/2015 s/p DCCV;  b. 12/2016 recurrent PAT - unchanged with escalating BB dose (rates 106-107).  ? PSVT (paroxysmal supraventricular tachycardia) ([place])   ? a. 10/2011 Admitted to [facility] w/ SVT-->broke with IV dilt.  ? Recurrent Nephrolithiasis   ? Schatzki's ring   ? Sleep apnea   ? [facility]- in process of being evaluated now, states 12 yrs. ago was on CPAP but after having his deviated septum repaired, he had put the CPAP in storage. Pt. will get a new machine soon.   ? Thoracic aortic aneurysm ([place])   ? a. 01/2016 CTA chest: 4.0 cm Asc Ao; b. 01/2017 Echo: nl Ao root size.  ? Type II diabetes mellitus ([place])   ? Umbilical hernia   ? "not repaired" (01/16/2013)  ? VSD (ventricular septal defect) w/ persistent systolic murmur   ? a. 12/6224 s/p VSD closure (dacron patch);  b. 01/2017 Echo: EF 50-55%, no rwma, mild AS, mild MR, mod dil LA, dilated RV, sev dil RA, mild TR. PASP nl.  ? ? ?Past Surgical History:  ?Procedure Laterality Date  ? CARDIAC CATHETERIZATION  > 5 yr ago  ? Done at [facility], reportedly clean  ? CARDIAC CATHETERIZATION  11/03/2012  ? CARDIOVERSION N/A 02/12/2015  ? Procedure: CARDIOVERSION;  Surgeon: Thayer Headings, MD;  Location: [facility] ENDOSCOPY;  Service: Cardiovascular;  Laterality: N/A;  ? CARDIOVERSION N/A  04/27/2017  ? Procedure: CARDIOVERSION;  Surgeon: Evans Lance, MD;  Location: [place] CV LAB;  Service: Cardiovascular;  Laterality: N/A;  ? CLIPPING OF ATRIAL APPENDAGE N/A 01/07/2013  ? Procedure: CLIPPING OF ATRIAL APPENDAGE;  Surgeon: Grace Isaac, MD;  Location: [place];  Service: Open Heart Surgery;  Laterality: N/A;  ? COLONOSCOPY W/ POLYPECTOMY    ? COLONOSCOPY WITH PROPOFOL N/A 01/05/2021  ? Procedure: COLONOSCOPY WITH PROPOFOL;  Surgeon: Toledo, Benay Pike, MD;  Location: [facility] ENDOSCOPY;  Service: Gastroenterology;  Laterality: N/A;  ? CORONARY ARTERY BYPASS GRAFT N/A 01/07/2013  ? Procedure: CORONARY ARTERY BYPASS  GRAFTING (CABG);  Surgeon: Grace Isaac, MD;  Location: [place];  Service: Open Heart Surgery;  Laterality: N/A;  Times1 using endoscopically harvested saphenous vein graft to the PDA  ? ESOPHAGOGASTRODUODENOSCOPY N/A 01/05/2021  ? Procedure: ESOPHAGOGASTRODUODENOSCOPY (EGD);  Surgeon: Toledo, Benay Pike, MD;  Location: [facility] ENDOSCOPY;  Service: Gastroenterology;  Laterality: N/A;  DM  ? EYE SURGERY Left 02/03/1989  ? "clipped muscle so it wouldn't be pulling up" (01/16/2013)  ? INTRAOPERATIVE TRANSESOPHAGEAL ECHOCARDIOGRAM N/A 01/07/2013  ? Procedure: INTRAOPERATIVE TRANSESOPHAGEAL ECHOCARDIOGRAM;  Surgeon: Grace Isaac, MD;  Location: [place];  Service: Open Heart Surgery;  Laterality: N/A;  ? LEFT AND RIGHT HEART CATHETERIZATION WITH CORONARY ANGIOGRAM N/A 11/20/2012  ? Procedure: LEFT AND RIGHT HEART CATHETERIZATION WITH CORONARY ANGIOGRAM;  Surgeon: Jolaine Artist, MD;  Location: [facility] CATH LAB;  Service: Cardiovascular;  Laterality: N/A;  ? MAZE N/A 01/07/2013  ? Procedure: MAZE;  Surgeon: Grace Isaac, MD;  Location: [place];  Service: Open Heart Surgery;  Laterality: N/A;  ? MULTIPLE EXTRACTIONS WITH ALVEOLOPLASTY N/A 12/11/2012  ? Procedure: Extraction of tooth #'s 0,2,5,8,52,77,82,42,35,36,14,43,15,40,08 wioth alveoloplasty and bialteral fibrous tuberosity reductions.;  Surgeon: Lenn Cal, DDS;  Location: [facility];  Service: Oral Surgery;  Laterality: N/A;  ? NASAL SEPTUM SURGERY  06/05/2002  ? RIGHT/LEFT HEART CATH AND CORONARY ANGIOGRAPHY N/A 08/02/2021  ? Procedure: RIGHT/LEFT HEART CATH AND CORONARY ANGIOGRAPHY;  Surgeon: Aaron Bush, MD;  Location: [place] CV LAB;  Service: Cardiovascular;  Laterality: N/A;  ? sepfal deveation repair    ? TEE WITHOUT CARDIOVERSION N/A 11/21/2012  ? Procedure: TRANSESOPHAGEAL ECHOCARDIOGRAM (TEE);  Surgeon: Jolaine Artist, MD;  Location: [facility] ENDOSCOPY;  Service: Cardiovascular;  Laterality: N/A;  ? VSD REPAIR N/A 01/07/2013  ? Procedure:  VENTRICULAR SEPTAL DEFECT (VSD) REPAIR;  Surgeon: Grace Isaac, MD;  Location: [place];  Service: Open Heart Surgery;  Laterality: N/A;  ? VSD REPAIR    ? ? ?Current Medications: ?Current Meds  ?Medication Sig  ? acetaminophen (TYLENOL) 500 MG tablet Take 1,000 mg by mouth daily as needed for moderate pain or headache.  ? amiodarone (PACERONE) 200 MG tablet Take 1 tablet (200 mg total) by mouth daily.  ? atorvastatin (LIPITOR) 20 MG tablet Take 1 tablet (20 mg total) by mouth at bedtime.  ? dapagliflozin propanediol (FARXIGA) 10 MG TABS tablet Take 1 tablet (10 mg total) by mouth daily before breakfast.  ? ELIQUIS 5 MG TABS tablet TAKE 1 TABLET BY MOUTH TWICE DAILY  ? furosemide (LASIX) 40 MG tablet Take 1 tablet (40 mg total) by mouth 2 (two) times daily.  ? glipiZIDE (GLUCOTROL) 10 MG tablet Take 5-10 mg by mouth See admin instructions. '10mg'$  in the am and '5mg'$  in the pm  ? Glucose Blood (BLOOD GLUCOSE TEST STRIPS) STRP 1 strip by In Vitro route 2 (two) times daily.  ?  isosorbide mononitrate (IMDUR) 30 MG 24 hr tablet Take 0.5 tablets (15 mg total) by mouth daily.  ? losartan (COZAAR) 25 MG tablet Take 0.5 tablets (12.5 mg total) by mouth daily.  ? metFORMIN (GLUCOPHAGE) 1000 MG tablet Take 1,000 mg by mouth 2 (two) times daily.  ? metoprolol succinate (TOPROL-XL) 100 MG 24 hr tablet Take 1 tablet (100 mg total) by mouth at bedtime. Take with or immediately following a meal.  ? omeprazole (PRILOSEC) 40 MG capsule Take 40 mg by mouth daily before breakfast.   ? sertraline (ZOLOFT) 50 MG tablet Take 50 mg by mouth daily.  ?  ? ?Allergies:   Biaxin [clarithromycin]  ? ?Social History  ? ?Socioeconomic History  ? Marital status: Married  ?  Spouse name: Not on file  ? Number of children: Not on file  ? Years of education: Not on file  ? Highest education level: Not on file  ?Occupational History  ? Occupation: Psychologist, educational  ?  Comment: Heavy work at times  ?Tobacco Use  ? Smoking status: Never  ? Smokeless tobacco:  Never  ?Vaping Use  ? Vaping Use: Never used  ?Substance and Sexual Activity  ? Alcohol use: No  ? Drug use: No  ? Sexual activity: Not Currently  ?Other Topics Concern  ? Not on file  ?Social History Aaron Jones

## 2021-09-08 ENCOUNTER — Other Ambulatory Visit
Admission: RE | Admit: 2021-09-08 | Discharge: 2021-09-08 | Disposition: A | Discharge status: Home / Self Care | Payer: Medicare Other | Source: Ambulatory Visit | Attending: Medical | Admitting: Medical

## 2021-09-08 ENCOUNTER — Ambulatory Visit (INDEPENDENT_AMBULATORY_CARE_PROVIDER_SITE_OTHER): Payer: Medicare Other | Admitting: Medical

## 2021-09-08 ENCOUNTER — Encounter: Payer: Self-pay | Admitting: Medical

## 2021-09-08 VITALS — BP 150/60 | HR 63 | Ht 72.0 in | Wt 215.0 lb

## 2021-09-08 DIAGNOSIS — I429 Cardiomyopathy, unspecified: Secondary | ICD-10-CM

## 2021-09-08 DIAGNOSIS — Q210 Ventricular septal defect: Secondary | ICD-10-CM | POA: Diagnosis not present

## 2021-09-08 DIAGNOSIS — I4819 Other persistent atrial fibrillation: Secondary | ICD-10-CM | POA: Diagnosis not present

## 2021-09-08 DIAGNOSIS — I5031 Acute diastolic (congestive) heart failure: Secondary | ICD-10-CM

## 2021-09-08 DIAGNOSIS — I25118 Atherosclerotic heart disease of native coronary artery with other forms of angina pectoris: Secondary | ICD-10-CM

## 2021-09-08 LAB — BASIC METABOLIC PANEL
Anion gap: 9 (ref 5–15)
BUN: 20 mg/dL (ref 8–23)
CO2: 27 mmol/L (ref 22–32)
Calcium: 9 mg/dL (ref 8.9–10.3)
Chloride: 100 mmol/L (ref 98–111)
Creatinine, Ser: 1.4 mg/dL — ABNORMAL HIGH (ref 0.61–1.24)
GFR, Estimated: 53 mL/min — ABNORMAL LOW (ref >60)
Glucose, Bld: 168 mg/dL — ABNORMAL HIGH (ref 70–99)
Potassium: 4 mmol/L (ref 3.5–5.1)
Sodium: 136 mmol/L (ref 135–145)

## 2021-09-08 MED ORDER — DAPAGLIFLOZIN PROPANEDIOL 10 MG PO TABS: 10.0000 mg | ORAL_TABLET | Freq: Every day | ORAL | 5 refills | Status: DC

## 2021-09-08 NOTE — Patient Instructions (Signed)
Medication Instructions:  ?Your physician has recommended you make the following change in your medication:  ? ?START Farxiga 10 mg daily. An Rx has been sent to your pharmacy. ? ? ?*If you need a refill on your cardiac medications before your next appointment, please call your pharmacy* ? ? ?Lab Work: ?Bmp today ? ?Your physician recommends that you return for lab work (Bmp) in: 2 weeks  ? ?Please have your labs drawn at the [facility]. No appt needed. Lab hours M-F 7am-6pm. Stop at the Registration desk to check in. ? ?If you have labs (blood work) drawn today and your tests are completely normal, you will receive your results only by: ?MyChart Message (if you have MyChart) OR ?A paper copy in the mail ?If you have any lab test that is abnormal or we need to change your treatment, we will call you to review the results. ? ? ?Testing/Procedures: ?None ordered ? ? ?Follow-Up: ?At [facility], you and your health needs are our priority.  As part of our continuing mission to provide you with exceptional heart care, we have created designated Provider Care Teams.  These Care Teams include your primary Cardiologist (physician) and Advanced Practice Providers (APPs -  Physician Assistants and Nurse Practitioners) who all work together to provide you with the care you need, when you need it. ? ?We recommend signing up for the patient portal called "MyChart".  Sign up information is provided on this After Visit Summary.  MyChart is used to connect with patients for Virtual Visits (Telemedicine).  Patients are able to view lab/test results, encounter notes, upcoming appointments, etc.  Non-urgent messages can be sent to your provider as well.   ?To learn more about what you can do with MyChart, go to [email or web address].   ? ?Your next appointment:   ?4 week(s) ? ?The format for your next appointment:   ?In Person ? ?Provider:   ?You may see Nelva Bush, MD or one of the following Advanced Practice Providers on  your designated Care Team:   ?Murray Hodgkins, NP ?Christell Faith, PA-C ?Cadence Kathlen Mody, PA-C{ ? ? ?Other Instructions ?N/A ? ?

## 2021-09-09 MED ORDER — FUROSEMIDE 40 MG PO TABS: 40.0000 mg | ORAL_TABLET | Freq: Every day | ORAL | Status: DC

## 2021-10-06 ENCOUNTER — Other Ambulatory Visit
Admission: RE | Admit: 2021-10-06 | Discharge: 2021-10-06 | Disposition: A | Discharge status: Home / Self Care | Payer: Medicare Other | Attending: Medical | Admitting: Medical

## 2021-10-06 DIAGNOSIS — I5031 Acute diastolic (congestive) heart failure: Secondary | ICD-10-CM | POA: Diagnosis present

## 2021-10-06 DIAGNOSIS — I429 Cardiomyopathy, unspecified: Secondary | ICD-10-CM | POA: Insufficient documentation

## 2021-10-06 LAB — BASIC METABOLIC PANEL
Anion gap: 10 (ref 5–15)
BUN: 22 mg/dL (ref 8–23)
CO2: 29 mmol/L (ref 22–32)
Calcium: 8.8 mg/dL — ABNORMAL LOW (ref 8.9–10.3)
Chloride: 99 mmol/L (ref 98–111)
Creatinine, Ser: 1.51 mg/dL — ABNORMAL HIGH (ref 0.61–1.24)
GFR, Estimated: 48 mL/min — ABNORMAL LOW (ref >60)
Glucose, Bld: 220 mg/dL — ABNORMAL HIGH (ref 70–99)
Potassium: 4.4 mmol/L (ref 3.5–5.1)
Sodium: 138 mmol/L (ref 135–145)

## 2021-10-07 ENCOUNTER — Ambulatory Visit (INDEPENDENT_AMBULATORY_CARE_PROVIDER_SITE_OTHER): Payer: Medicare Other | Admitting: Medical

## 2021-10-07 ENCOUNTER — Encounter: Payer: Self-pay | Admitting: Medical

## 2021-10-07 VITALS — BP 154/70 | HR 66 | Ht 72.0 in | Wt 213.0 lb

## 2021-10-07 DIAGNOSIS — I480 Paroxysmal atrial fibrillation: Secondary | ICD-10-CM

## 2021-10-07 DIAGNOSIS — I429 Cardiomyopathy, unspecified: Secondary | ICD-10-CM | POA: Diagnosis not present

## 2021-10-07 DIAGNOSIS — I5031 Acute diastolic (congestive) heart failure: Secondary | ICD-10-CM

## 2021-10-07 DIAGNOSIS — I10 Essential (primary) hypertension: Secondary | ICD-10-CM

## 2021-10-07 DIAGNOSIS — Q210 Ventricular septal defect: Secondary | ICD-10-CM

## 2021-10-07 DIAGNOSIS — I5032 Chronic diastolic (congestive) heart failure: Secondary | ICD-10-CM

## 2021-10-07 DIAGNOSIS — I2510 Atherosclerotic heart disease of native coronary artery without angina pectoris: Secondary | ICD-10-CM

## 2021-10-07 MED ORDER — LOSARTAN POTASSIUM 25 MG PO TABS
25.0000 mg | ORAL_TABLET | Freq: Every day | ORAL | 5 refills | Status: DC
Start: 2021-10-07 — End: 2022-06-20

## 2021-10-07 MED ORDER — APIXABAN 5 MG PO TABS: 5.0000 mg | ORAL_TABLET | Freq: Two times a day (BID) | ORAL | 3 refills | Status: DC

## 2021-10-07 NOTE — Progress Notes (Signed)
?Cardiology Office Note:   ? ?Date:  10/11/2021  ? ?ID:  Aaron Jones, DOB 13-Jan-1946, MRN [id number] ? ?PCP:  Kathlyn Sacramento, MD  ?[facility] Cardiologist:  Nelva Bush, MD  ?[facility] Electrophysiologist:  Cristopher Peru, MD  ? ?Referring MD: Kathlyn Sacramento, MD  ? ?Chief Complaint: 1 month follow-up ? ?History of Present Illness:   ? ?Aaron Jones is a 76 y.o. male with a hx of paroxysmal Afib s/p MAZE procedure and left atrial appendage clipping, CAD s/p CABGx1 (SVG-PDA), VSD s/p open repair with Dacron patch, paroxysmal atrial tachycardia, thoracic aortic anuerysm, HTN, HLD, DM2, OSA who presents for cath follow-up.  ?  ?He was previously referred by EP for VSD. He was seen 07/28/21 reporting SOB and fatigue. He was set up for cardiac cath.  ?  ?Catheterization showed moderate to severe but non-critical disease involving ostial D1, rmaus intermedius, and rPDA, patent SVG-rPDA, mildly elevated left heart filling pressures, severely elevated right heart pressures, moderate pulmonary HTN, low normal reduced CO, no shunting. Imdur was added. Lasix was increased to '40mg'$  daily.  ? ?Last seen 09/08/21 and reported improving LLE. Wilder Glade was added. Follow-up labs showed worsening kidney function.  ? ?Today, the patient reports he has been doing well. No LLE on exam. We will stop farxig and re-check labs in 2 weeks. He is willing to try spironolactone. No chest pain or SOB. Trace LLE on exam today. EKG shows SR.  ?  ? ?Past Medical History:  ?Diagnosis Date  ? Atrial fibrillation with rapid ventricular response ([place]) 01/16/2013  ? a. 01/2013 s/p R & L Maze and LAA clipping in setting of VSD repair;  b. Prev on amiodarone;  c. CHA2DS2VASc = 4-->Apixaban;  c. 01/2013 Recurrent afib/flutter requiring DCCV.  ? CHF (congestive heart failure) ([place])   ? Colon polyp   ? COPD (chronic obstructive pulmonary disease) ([place])   ? Coronary artery disease   ? a. 01/2013 s/p CABG x 1 (VG->PDA) in setting of VSD repair.  ?  GERD (gastroesophageal reflux disease)   ? Hyperlipidemia   ? Hypertension   ? PCP- [place], phone; [phone]  ? Mitral valve disorder   ? Obesity   ? PAT (paroxysmal atrial tachycardia) ([place])   ? a. 02/2015 s/p DCCV;  b. 12/2016 recurrent PAT - unchanged with escalating BB dose (rates 106-107).  ? PSVT (paroxysmal supraventricular tachycardia) ([place])   ? a. 10/2011 Admitted to [facility] w/ SVT-->broke with IV dilt.  ? Recurrent Nephrolithiasis   ? Schatzki's ring   ? Sleep apnea   ? [facility]- in process of being evaluated now, states 12 yrs. ago was on CPAP but after having his deviated septum repaired, he had put the CPAP in storage. Pt. will get a new machine soon.   ? Thoracic aortic aneurysm ([place])   ? a. 01/2016 CTA chest: 4.0 cm Asc Ao; b. 01/2017 Echo: nl Ao root size.  ? Type II diabetes mellitus ([place])   ? Umbilical hernia   ? "not repaired" (01/16/2013)  ? VSD (ventricular septal defect) w/ persistent systolic murmur   ? a. 11/2227 s/p VSD closure (dacron patch);  b. 01/2017 Echo: EF 50-55%, no rwma, mild AS, mild MR, mod dil LA, dilated RV, sev dil RA, mild TR. PASP nl.  ? ? ?Past Surgical History:  ?Procedure Laterality Date  ? CARDIAC CATHETERIZATION  > 5 yr ago  ? Done at [facility], reportedly clean  ? CARDIAC CATHETERIZATION  11/03/2012  ?  CARDIOVERSION N/A 02/12/2015  ? Procedure: CARDIOVERSION;  Surgeon: Thayer Headings, MD;  Location: [place];  Service: Cardiovascular;  Laterality: N/A;  ? CARDIOVERSION N/A 04/27/2017  ? Procedure: CARDIOVERSION;  Surgeon: Evans Lance, MD;  Location: [place] CV [facility];  Service: Cardiovascular;  Laterality: N/A;  ? CLIPPING OF ATRIAL APPENDAGE N/A 01/07/2013  ? Procedure: CLIPPING OF ATRIAL APPENDAGE;  Surgeon: Grace Isaac, MD;  Location: [place];  Service: Open Heart Surgery;  Laterality: N/A;  ? COLONOSCOPY W/ POLYPECTOMY    ? COLONOSCOPY WITH PROPOFOL N/A 01/05/2021  ? Procedure: COLONOSCOPY WITH PROPOFOL;  Surgeon: Toledo, Benay Pike, MD;  Location: [facility]  ENDOSCOPY;  Service: Gastroenterology;  Laterality: N/A;  ? CORONARY ARTERY BYPASS GRAFT N/A 01/07/2013  ? Procedure: CORONARY ARTERY BYPASS GRAFTING (CABG);  Surgeon: Grace Isaac, MD;  Location: [place];  Service: Open Heart Surgery;  Laterality: N/A;  Times1 using endoscopically harvested saphenous vein graft to the PDA  ? ESOPHAGOGASTRODUODENOSCOPY N/A 01/05/2021  ? Procedure: ESOPHAGOGASTRODUODENOSCOPY (EGD);  Surgeon: Toledo, Benay Pike, MD;  Location: [facility] ENDOSCOPY;  Service: Gastroenterology;  Laterality: N/A;  DM  ? EYE SURGERY Left 02/03/1989  ? "clipped muscle so it wouldn't be pulling up" (01/16/2013)  ? INTRAOPERATIVE TRANSESOPHAGEAL ECHOCARDIOGRAM N/A 01/07/2013  ? Procedure: INTRAOPERATIVE TRANSESOPHAGEAL ECHOCARDIOGRAM;  Surgeon: Grace Isaac, MD;  Location: [place];  Service: Open Heart Surgery;  Laterality: N/A;  ? LEFT AND RIGHT HEART CATHETERIZATION WITH CORONARY ANGIOGRAM N/A 11/20/2012  ? Procedure: LEFT AND RIGHT HEART CATHETERIZATION WITH CORONARY ANGIOGRAM;  Surgeon: Jolaine Artist, MD;  Location: [facility] CATH LAB;  Service: Cardiovascular;  Laterality: N/A;  ? MAZE N/A 01/07/2013  ? Procedure: MAZE;  Surgeon: Grace Isaac, MD;  Location: [place];  Service: Open Heart Surgery;  Laterality: N/A;  ? MULTIPLE EXTRACTIONS WITH ALVEOLOPLASTY N/A 12/11/2012  ? Procedure: Extraction of tooth #'s 7,3,2,2,02,54,27,06,23,76,28,31,51,76,16 wioth alveoloplasty and bialteral fibrous tuberosity reductions.;  Surgeon: Lenn Cal, DDS;  Location: [facility];  Service: Oral Surgery;  Laterality: N/A;  ? NASAL SEPTUM SURGERY  06/05/2002  ? RIGHT/LEFT HEART CATH AND CORONARY ANGIOGRAPHY N/A 08/02/2021  ? Procedure: RIGHT/LEFT HEART CATH AND CORONARY ANGIOGRAPHY;  Surgeon: Nelva Bush, MD;  Location: [place] CV LAB;  Service: Cardiovascular;  Laterality: N/A;  ? sepfal deveation repair    ? TEE WITHOUT CARDIOVERSION N/A 11/21/2012  ? Procedure: TRANSESOPHAGEAL ECHOCARDIOGRAM (TEE);  Surgeon:  Jolaine Artist, MD;  Location: [facility] ENDOSCOPY;  Service: Cardiovascular;  Laterality: N/A;  ? VSD REPAIR N/A 01/07/2013  ? Procedure: VENTRICULAR SEPTAL DEFECT (VSD) REPAIR;  Surgeon: Grace Isaac, MD;  Location: [place];  Service: Open Heart Surgery;  Laterality: N/A;  ? VSD REPAIR    ? ? ?Current Medications: ?Current Meds  ?Medication Sig  ? acetaminophen (TYLENOL) 500 MG tablet Take 1,000 mg by mouth daily as needed for moderate pain or headache.  ? amiodarone (PACERONE) 200 MG tablet Take 1 tablet (200 mg total) by mouth daily.  ? atorvastatin (LIPITOR) 20 MG tablet Take 1 tablet (20 mg total) by mouth at bedtime.  ? furosemide (LASIX) 40 MG tablet Take 1 tablet (40 mg total) by mouth daily.  ? glipiZIDE (GLUCOTROL) 10 MG tablet Take 5-10 mg by mouth See admin instructions. '10mg'$  in the am and '5mg'$  in the pm  ? Glucose Blood (BLOOD GLUCOSE TEST STRIPS) STRP 1 strip by In Vitro route 2 (two) times daily.  ? isosorbide mononitrate (IMDUR) 30 MG 24 hr tablet Take 0.5  tablets (15 mg total) by mouth daily.  ? metFORMIN (GLUCOPHAGE) 1000 MG tablet Take 1,000 mg by mouth 2 (two) times daily.  ? metoprolol succinate (TOPROL-XL) 100 MG 24 hr tablet Take 1 tablet (100 mg total) by mouth at bedtime. Take with or immediately following a meal.  ? omeprazole (PRILOSEC) 40 MG capsule Take 40 mg by mouth daily before breakfast.   ? pioglitazone (ACTOS) 30 MG tablet Take 30 mg by mouth daily.  ? sertraline (ZOLOFT) 50 MG tablet Take 50 mg by mouth daily.  ? [DISCONTINUED] dapagliflozin propanediol (FARXIGA) 10 MG TABS tablet Take 1 tablet (10 mg total) by mouth daily before breakfast.  ? [DISCONTINUED] ELIQUIS 5 MG TABS tablet TAKE 1 TABLET BY MOUTH TWICE DAILY  ? [DISCONTINUED] losartan (COZAAR) 25 MG tablet Take 0.5 tablets (12.5 mg total) by mouth daily.  ?  ? ?Allergies:   Biaxin [clarithromycin]  ? ?Social History  ? ?Socioeconomic History  ? Marital status: Married  ?  Spouse name: Not on file  ? Number of children:  Not on file  ? Years of education: Not on file  ? Highest education level: Not on file  ?Occupational History  ? Occupation: Psychologist, educational  ?  Comment: Heavy work at times  ?Tobacco Use  ? Smoking status: Ne

## 2021-10-07 NOTE — Patient Instructions (Signed)
Medication Instructions:  ?Your physician has recommended you make the following change in your medication:  ? ?STOP Farxiga ? ?INCREASE Losartan to 25 mg daily. An Rx has been sent to your pharmacy ? ?*If you need a refill on your cardiac medications before your next appointment, please call your pharmacy* ? ? ?Lab Work: ?Your physician recommends that you return for lab work (Bmp) in: 2 weeks ? ?Please have your lab drawn at the [facility]. No appt needed. Lab hours are Mon-Fri 7am-6pm. ? ?If you have labs (blood work) drawn today and your tests are completely normal, you will receive your results only by: ?MyChart Message (if you have MyChart) OR ?A paper copy in the mail ?If you have any lab test that is abnormal or we need to change your treatment, we will call you to review the results. ? ? ?Testing/Procedures: ?None ordered ? ? ?Follow-Up: ?At [facility], you and your health needs are our priority.  As part of our continuing mission to provide you with exceptional heart care, we have created designated Provider Care Teams.  These Care Teams include your primary Cardiologist (physician) and Advanced Practice Providers (APPs -  Physician Assistants and Nurse Practitioners) who all work together to provide you with the care you need, when you need it. ? ?We recommend signing up for the patient portal called "MyChart".  Sign up information is provided on this After Visit Summary.  MyChart is used to connect with patients for Virtual Visits (Telemedicine).  Patients are able to view lab/test results, encounter notes, upcoming appointments, etc.  Non-urgent messages can be sent to your provider as well.   ?To learn more about what you can do with MyChart, go to [email or web address].   ? ?Your next appointment:   ?4 week(s) ? ?The format for your next appointment:   ?In Person ? ?Provider:   ?You may see Nelva Bush, MD or one of the following Advanced Practice Providers on your designated Care  Team:   ?Murray Hodgkins, NP ?Christell Faith, PA-C ?Cadence Kathlen Mody, PA-C ? ? ?Other Instructions ?N/A ? ?Important Information About Sugar ? ? ? ? ? ? ?

## 2021-10-14 ENCOUNTER — Other Ambulatory Visit
Admission: RE | Admit: 2021-10-14 | Discharge: 2021-10-14 | Disposition: A | Discharge status: Home / Self Care | Payer: Medicare Other | Attending: Medical | Admitting: Medical

## 2021-10-14 DIAGNOSIS — I5032 Chronic diastolic (congestive) heart failure: Secondary | ICD-10-CM | POA: Diagnosis present

## 2021-10-14 DIAGNOSIS — I429 Cardiomyopathy, unspecified: Secondary | ICD-10-CM | POA: Insufficient documentation

## 2021-10-14 LAB — BASIC METABOLIC PANEL
Anion gap: 7 (ref 5–15)
BUN: 17 mg/dL (ref 8–23)
CO2: 28 mmol/L (ref 22–32)
Calcium: 9.1 mg/dL (ref 8.9–10.3)
Chloride: 106 mmol/L (ref 98–111)
Creatinine, Ser: 1.38 mg/dL — ABNORMAL HIGH (ref 0.61–1.24)
GFR, Estimated: 54 mL/min — ABNORMAL LOW (ref >60)
Glucose, Bld: 124 mg/dL — ABNORMAL HIGH (ref 70–99)
Potassium: 3.9 mmol/L (ref 3.5–5.1)
Sodium: 141 mmol/L (ref 135–145)

## 2021-11-04 ENCOUNTER — Ambulatory Visit (INDEPENDENT_AMBULATORY_CARE_PROVIDER_SITE_OTHER): Payer: Medicare Other | Admitting: Medical

## 2021-11-04 ENCOUNTER — Encounter: Payer: Self-pay | Admitting: Medical

## 2021-11-04 VITALS — BP 128/50 | HR 62 | Ht 72.0 in | Wt 212.0 lb

## 2021-11-04 DIAGNOSIS — Q210 Ventricular septal defect: Secondary | ICD-10-CM | POA: Diagnosis not present

## 2021-11-04 DIAGNOSIS — I429 Cardiomyopathy, unspecified: Secondary | ICD-10-CM | POA: Diagnosis not present

## 2021-11-04 DIAGNOSIS — I4819 Other persistent atrial fibrillation: Secondary | ICD-10-CM | POA: Diagnosis not present

## 2021-11-04 DIAGNOSIS — I5032 Chronic diastolic (congestive) heart failure: Secondary | ICD-10-CM | POA: Diagnosis not present

## 2021-11-04 DIAGNOSIS — I10 Essential (primary) hypertension: Secondary | ICD-10-CM

## 2021-11-04 NOTE — Progress Notes (Signed)
Cardiology Office Note:    Date:  11/04/2021   ID:  Aaron Jones, DOB 04-13-1946, MRN [id number]  PCP:  Kathlyn Sacramento, MD  [facility] Cardiologist:  Nelva Bush, MD  [facility] Electrophysiologist:  Cristopher Peru, MD   Referring MD: Kathlyn Sacramento, MD   Chief Complaint: 1 month follow-up  History of Present Illness:    Aaron Jones is a 76 y.o. male with a hx of  paroxysmal Afib s/p MAZE procedure and left atrial appendage clipping, CAD s/p CABGx1 (SVG-PDA), VSD s/p open repair with Dacron patch, paroxysmal atrial tachycardia, thoracic aortic anuerysm, HTN, HLD, DM2, OSA who presents for cath follow-up.    He was previously referred by EP for VSD. He was seen 07/28/21 reporting SOB and fatigue. He was set up for cardiac cath. Catheterization showed moderate to severe but non-critical disease involving ostial D1, rmaus intermedius, and rPDA, patent SVG-rPDA, mildly elevated left heart filling pressures, severely elevated right heart pressures, moderate pulmonary HTN, low normal reduced CO, no shunting. Imdur was added. Lasix was increased to '40mg'$  daily.   Last seen 10/07/21 and was overall doing well. Wilder Glade was stopped for worsening kidney function. He was started on spironolactone.   Today, the patient reports he is overall doing well. Follow-up kidney function with spirolactone was better but not back to baseline. No chest pain, SOB, LLE, orthopnea, pnd.   Past Medical History:  Diagnosis Date   Atrial fibrillation with rapid ventricular response ([place]) 01/16/2013   a. 01/2013 s/p R & L Maze and LAA clipping in setting of VSD repair;  b. Prev on amiodarone;  c. CHA2DS2VASc = 4-->Apixaban;  c. 01/2013 Recurrent afib/flutter requiring DCCV.   CHF (congestive heart failure) (HCC)    Colon polyp    COPD (chronic obstructive pulmonary disease) ([place])    Coronary artery disease    a. 01/2013 s/p CABG x 1 (VG->PDA) in setting of VSD repair.   GERD (gastroesophageal reflux  disease)    Hyperlipidemia    Hypertension    PCP- [place], phone; [phone]   Mitral valve disorder    Obesity    PAT (paroxysmal atrial tachycardia) ([place])    a. 02/2015 s/p DCCV;  b. 12/2016 recurrent PAT - unchanged with escalating BB dose (rates 106-107).   PSVT (paroxysmal supraventricular tachycardia) ([place])    a. 10/2011 Admitted to [facility] w/ SVT-->broke with IV dilt.   Recurrent Nephrolithiasis    Schatzki's ring    Sleep apnea    [facility]- in process of being evaluated now, states 12 yrs. ago was on CPAP but after having his deviated septum repaired, he had put the CPAP in storage. Pt. will get a new machine soon.    Thoracic aortic aneurysm ([place])    a. 01/2016 CTA chest: 4.0 cm Asc Ao; b. 01/2017 Echo: nl Ao root size.   Type II diabetes mellitus ([place])    Umbilical hernia    "not repaired" (01/16/2013)   VSD (ventricular septal defect) w/ persistent systolic murmur    a. 02/8920 s/p VSD closure (dacron patch);  b. 01/2017 Echo: EF 50-55%, no rwma, mild AS, mild MR, mod dil LA, dilated RV, sev dil RA, mild TR. PASP nl.    Past Surgical History:  Procedure Laterality Date   CARDIAC CATHETERIZATION  > 5 yr ago   72 at [facility], reportedly clean   CARDIAC CATHETERIZATION  11/03/2012   CARDIOVERSION N/A 02/12/2015   Procedure: CARDIOVERSION;  Surgeon: Wonda Cheng Nahser,  MD;  Location: [place];  Service: Cardiovascular;  Laterality: N/A;   CARDIOVERSION N/A 04/27/2017   Procedure: CARDIOVERSION;  Surgeon: Evans Lance, MD;  Location: [place] CV LAB;  Service: Cardiovascular;  Laterality: N/A;   CLIPPING OF ATRIAL APPENDAGE N/A 01/07/2013   Procedure: CLIPPING OF ATRIAL APPENDAGE;  Surgeon: Grace Isaac, MD;  Location: [place];  Service: Open Heart Surgery;  Laterality: N/A;   COLONOSCOPY W/ POLYPECTOMY     COLONOSCOPY WITH PROPOFOL N/A 01/05/2021   Procedure: COLONOSCOPY WITH PROPOFOL;  Surgeon: Tol[place], Benay Pike, MD;  Location: [facility] ENDOSCOPY;  Service:  Gastroenterology;  Laterality: N/A;   CORONARY ARTERY BYPASS GRAFT N/A 01/07/2013   Procedure: CORONARY ARTERY BYPASS GRAFTING (CABG);  Surgeon: Grace Isaac, MD;  Location: [place];  Service: Open Heart Surgery;  Laterality: N/A;  Times1 using endoscopically harvested saphenous vein graft to the PDA   ESOPHAGOGASTRODUODENOSCOPY N/A 01/05/2021   Procedure: ESOPHAGOGASTRODUODENOSCOPY (EGD);  Surgeon: Toledo, Benay Pike, MD;  Location: [facility] ENDOSCOPY;  Service: Gastroenterology;  Laterality: N/A;  DM   EYE SURGERY Left 02/03/1989   "clipped muscle so it wouldn't be pulling up" (01/16/2013)   INTRAOPERATIVE TRANSESOPHAGEAL ECHOCARDIOGRAM N/A 01/07/2013   Procedure: INTRAOPERATIVE TRANSESOPHAGEAL ECHOCARDIOGRAM;  Surgeon: Grace Isaac, MD;  Location: [place];  Service: Open Heart Surgery;  Laterality: N/A;   LEFT AND RIGHT HEART CATHETERIZATION WITH CORONARY ANGIOGRAM N/A 11/20/2012   Procedure: LEFT AND RIGHT HEART CATHETERIZATION WITH CORONARY ANGIOGRAM;  Surgeon: Jolaine Artist, MD;  Location: [facility] CATH LAB;  Service: Cardiovascular;  Laterality: N/A;   MAZE N/A 01/07/2013   Procedure: MAZE;  Surgeon: Grace Isaac, MD;  Location: [place];  Service: Open Heart Surgery;  Laterality: N/A;   MULTIPLE EXTRACTIONS WITH ALVEOLOPLASTY N/A 12/11/2012   Procedure: Extraction of tooth #'s 6,0,7,3,71,06,26,94,85,46,27,03,50,09,38 wioth alveoloplasty and bialteral fibrous tuberosity reductions.;  Surgeon: Lenn Cal, DDS;  Location: [facility];  Service: Oral Surgery;  Laterality: N/A;   NASAL SEPTUM SURGERY  06/05/2002   RIGHT/LEFT HEART CATH AND CORONARY ANGIOGRAPHY N/A 08/02/2021   Procedure: RIGHT/LEFT HEART CATH AND CORONARY ANGIOGRAPHY;  Surgeon: Nelva Bush, MD;  Location: [place] CV LAB;  Service: Cardiovascular;  Laterality: N/A;   sepfal deveation repair     TEE WITHOUT CARDIOVERSION N/A 11/21/2012   Procedure: TRANSESOPHAGEAL ECHOCARDIOGRAM (TEE);  Surgeon: Jolaine Artist,  MD;  Location: [facility] ENDOSCOPY;  Service: Cardiovascular;  Laterality: N/A;   VSD REPAIR N/A 01/07/2013   Procedure: VENTRICULAR SEPTAL DEFECT (VSD) REPAIR;  Surgeon: Grace Isaac, MD;  Location: [place];  Service: Open Heart Surgery;  Laterality: N/A;   VSD REPAIR      Current Medications: Current Meds  Medication Sig   acetaminophen (TYLENOL) 500 MG tablet Take 1,000 mg by mouth daily as needed for moderate pain or headache.   amiodarone (PACERONE) 200 MG tablet Take 1 tablet (200 mg total) by mouth daily.   apixaban (ELIQUIS) 5 MG TABS tablet Take 1 tablet (5 mg total) by mouth 2 (two) times daily.   atorvastatin (LIPITOR) 20 MG tablet Take 1 tablet (20 mg total) by mouth at bedtime.   furosemide (LASIX) 40 MG tablet Take 1 tablet (40 mg total) by mouth daily.   glipiZIDE (GLUCOTROL) 10 MG tablet Take 5-10 mg by mouth See admin instructions. '10mg'$  in the am and '5mg'$  in the pm   Glucose Blood (BLOOD GLUCOSE TEST STRIPS) STRP 1 strip by In Vitro route 2 (two) times daily.   isosorbide mononitrate (  IMDUR) 30 MG 24 hr tablet Take 0.5 tablets (15 mg total) by mouth daily.   losartan (COZAAR) 25 MG tablet Take 1 tablet (25 mg total) by mouth daily.   metFORMIN (GLUCOPHAGE) 1000 MG tablet Take 1,000 mg by mouth 2 (two) times daily.   metoprolol succinate (TOPROL-XL) 100 MG 24 hr tablet Take 1 tablet (100 mg total) by mouth at bedtime. Take with or immediately following a meal.   omeprazole (PRILOSEC) 40 MG capsule Take 40 mg by mouth daily before breakfast.    pioglitazone (ACTOS) 30 MG tablet Take 30 mg by mouth daily.   sertraline (ZOLOFT) 50 MG tablet Take 50 mg by mouth daily.     Allergies:   Biaxin [clarithromycin]   Social History   Socioeconomic History   Marital status: Married    Spouse name: Not on file   Number of children: Not on file   Years of education: Not on file   Highest education level: Not on file  Occupational History   Occupation: Manufacturing    Comment: Heavy  work at times  Tobacco Use   Smoking status: Never   Smokeless tobacco: Never  Vaping Use   Vaping Use: Never used  Substance and Sexual Activity   Alcohol use: No   Drug use: No   Sexual activity: Not Currently  Other Topics Concern   Not on file  Social History Narrative   Still works full time. Works around the yard. Lives with wife. Has 2 sisters, neither with cardiac issues.   Social Determinants of Health   Financial Resource Strain: Not on file  Food Insecurity: Not on file  Transportation Needs: Not on file  Physical Activity: Not on file  Stress: Not on file  Social Connections: Not on file     Family History: The patient's family history includes Cancer in his mother; Diabetes in his mother.  ROS:   Please see the history of present illness.     All other systems reviewed and are negative.  EKGs/Labs/Other Studies Reviewed:    The following studies were reviewed today:  R/L Cardiac cath 07/2019 Conclusions: Moderate-severe but non-critical disease involving ostial D1, ramus intermedius, and rPDA. Widely patent SVG-rPDA. Mildly elevated left heart filling pressures with prominent V-waves noted on PCWP tracing (PCWP 26 mmHg; LVEDP 20-25 mmHg). Severely elevated right heart filling pressures (RAP/RVEDP 16 mmHg). Moderate pulmonary hypertension (mean PAP 40 mmHg; PVR 2.8 WU). Low normal to mildly reduced Fick cardiac output/index (CO 4.9 L/min, CI 2.3 L/min/m). No significant intracardiac shunting (QPS 1.0-1.2).   Recommendations: Escalate diuresis: We will increase furosemide to 40 mg twice daily. Add isosorbide mononitrate 15 mg daily for antianginal therapy. Increased goal-directed medical therapy for nonischemic cardiomyopathy as an outpatient, as tolerated. Continue clinical and echocardiographic monitoring of VSD; shunting does not appear to be significant at this time. Consider referral to advanced heart failure clinic if symptoms persist. Restart  apixaban tomorrow morning if no evidence of bleeding/vascular injury.   Nelva Bush, MD [facility]   Heart monitor 05/2021  Study Highlights   HR 52 - 231, average 77 bpm. No atrial fibrillation detected. 47 SVT, longest lasting 19 beats. Occasional ventricular ectopy, 3.7%. Rare supraventricular ectopy, 4.1%. No sustained arrhythmias.   Lysbeth Galas T. Quentin Ore, MD, [facility], [facility] Cardiac Electrophysiology   Echo 12/2020  1. Rhythm appears to be atrial flutter - no A waves noted.   2. Prior dacron patch VSD repair is compromised with left to right  shunting noted at  the membranous septum (images 35, 45) - peak gradient is  242 mmHg (systolic BP). The neck of the fenestration measures about 0.5  cm. Left ventricular ejection fraction,   by estimation, is 45 to 50%. The left ventricle has mildly decreased  function. The left ventricle demonstrates global hypokinesis. There is  moderate left ventricular hypertrophy. Left ventricular diastolic function  could not be evaluated.   3. Right ventricular systolic function is normal. The right ventricular  size is normal. There is mildly elevated pulmonary artery systolic  pressure. The estimated right ventricular systolic pressure is 68.3 mmHg.   4. Left atrial size was severely dilated.   5. Right atrial size was moderately dilated.   6. The mitral valve is abnormal. Trivial mitral valve regurgitation.  Moderate mitral annular calcification.   7. Tricuspid valve regurgitation is moderate.   8. The aortic valve is abnormal. Aortic valve regurgitation is trivial.   9. Aortic dilatation noted. There is borderline dilatation of the  ascending aorta, measuring 38 mm.  10. The inferior vena cava is normal in size with <50% respiratory  variability, suggesting right atrial pressure of 8 mmHg.   Comparison(s): Changes from prior study are noted. 06/14/2018: LVEF  55-60%,.    EKG:  EKG is  ordered today.  The ekg ordered today demonstrates  NSR, RBBB, LAD, LAFB, bifascicular block, no changes  Recent Labs: 12/07/2020: ALT 8; TSH 0.698 07/28/2021: Hemoglobin 12.0; Platelets 249 10/14/2021: BUN 17; Creatinine, Ser 1.38; Potassium 3.9; Sodium 141  Recent Lipid Panel    Component Value Date/Time   CHOL 142 04/17/2013 0833   CHOL 192 10/29/2011 0429   TRIG 125 04/17/2013 0833   TRIG 150 10/29/2011 0429   HDL 41 04/17/2013 0833   HDL 32 (L) 10/29/2011 0429   CHOLHDL 3.5 04/17/2013 0833   VLDL 30 10/29/2011 0429   LDLCALC 76 04/17/2013 0833   LDLCALC 130 (H) 10/29/2011 0429    Physical Exam:    VS:  BP (!) 128/50 (BP Location: Left Arm, Patient Position: Sitting, Cuff Size: Normal)   Pulse 62   Ht 6' (1.829 m)   Wt 212 lb (96.2 kg)   SpO2 98%   BMI 28.75 kg/m     Wt Readings from Last 3 Encounters:  11/04/21 212 lb (96.2 kg)  10/07/21 213 lb (96.6 kg)  09/08/21 215 lb (97.5 kg)     GEN:  Well nourished, well developed in no acute distress HEENT: Normal NECK: No JVD; No carotid bruits LYMPHATICS: No lymphadenopathy CARDIAC: RRR, no murmurs, rubs, gallops RESPIRATORY:  Clear to auscultation without rales, wheezing or rhonchi  ABDOMEN: Soft, non-tender, non-distended MUSCULOSKELETAL:  No edema; No deformity  SKIN: Warm and dry NEUROLOGIC:  Alert and oriented x 3 PSYCHIATRIC:  Normal affect   ASSESSMENT:    1. Cardiomyopathy, unspecified type ([place])   2. Chronic diastolic heart failure (HCC)   3. Persistent atrial fibrillation ([place])   4. VSD (ventricular septal defect)   5. Primary hypertension   6. 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 Spironolactone added at the last visit, follow-up labs showed improved kidney function, but still elevated. He is euvolemic on exam today, on lasix '40mg'$  daily. Continue Toprol and Losartan. He did not tolerate Farxiga in the past. I will repeat a BMET today. I will also order limited echo to assess  pump function.   VSD Prior heart cath showed  no shunting. Per Dr. Saunders Revel, no further work-up planned.   CAD Patient denies anginal symptoms. LHC with non-critical CAD< report above. Continue Imdur '15mg'$  daily, Toprol '100mg'$  daily, and Lipitor '20mg'$  daily.   HTN BP is better today. Continue Losartan and Toprol.   Persistent Afib EKG shows NSR on amiodarone '200mg'$  daily. Continue Eliquis '5mg'$  BID for stroke ppx. Continue toprol for rate control.   Disposition: Follow up in 3 month(s) with MD/APP    Signed, Emmerie Battaglia Ninfa Meeker, PA-C  11/04/2021 2:35 PM    [facility]

## 2021-11-04 NOTE — Patient Instructions (Signed)
Medication Instructions:   Your physician recommends that you continue on your current medications as directed. Please refer to the Current Medication list given to you today.   *If you need a refill on your cardiac medications before your next appointment, please call your pharmacy*   Lab Work:  Today: Engineer, production at [facility] 1st desk on the right to check in (REGISTRATION)  Lab hours: Monday- Friday (7:30 am- 5:30 pm)   If you have labs (blood work) drawn today and your tests are completely normal, you will receive your results only by: MyChart Message (if you have MyChart) OR A paper copy in the mail If you have any lab test that is abnormal or we need to change your treatment, we will call you to review the results.   Testing/Procedures:  Your physician has requested that you have an echocardiogram. Echocardiography is a painless test that uses sound waves to create images of your heart. It provides your doctor with information about the size and shape of your heart and how well your heart's chambers and valves are working. This procedure takes approximately one hour. There are no restrictions for this procedure.    Follow-Up: At [facility], you and your health needs are our priority.  As part of our continuing mission to provide you with exceptional heart care, we have created designated Provider Care Teams.  These Care Teams include your primary Cardiologist (physician) and Advanced Practice Providers (APPs -  Physician Assistants and Nurse Practitioners) who all work together to provide you with the care you need, when you need it.  We recommend signing up for the patient portal called "MyChart".  Sign up information is provided on this After Visit Summary.  MyChart is used to connect with patients for Virtual Visits (Telemedicine).  Patients are able to view lab/test results, encounter notes, upcoming appointments, etc.  Non-urgent messages can be sent to your  provider as well.   To learn more about what you can do with MyChart, go to [email or web address].    Your next appointment:   3 month(s)  The format for your next appointment:   In Person  Provider:   You may see Nelva Bush, MD or one of the following Advanced Practice Providers on your designated Care Team:   Murray Hodgkins, NP Christell Faith, PA-C Cadence Kathlen Mody, [place]   Other Instructions N/A  Important Information About Sugar

## 2021-11-10 ENCOUNTER — Other Ambulatory Visit
Admission: RE | Admit: 2021-11-10 | Discharge: 2021-11-10 | Disposition: A | Discharge status: Home / Self Care | Payer: Medicare Other | Attending: Medical | Admitting: Medical

## 2021-11-10 DIAGNOSIS — I429 Cardiomyopathy, unspecified: Secondary | ICD-10-CM | POA: Diagnosis present

## 2021-11-10 LAB — BASIC METABOLIC PANEL
Anion gap: 5 (ref 5–15)
BUN: 16 mg/dL (ref 8–23)
CO2: 28 mmol/L (ref 22–32)
Calcium: 8.9 mg/dL (ref 8.9–10.3)
Chloride: 105 mmol/L (ref 98–111)
Creatinine, Ser: 1.2 mg/dL (ref 0.61–1.24)
GFR, Estimated: >60 mL/min (ref >60)
Glucose, Bld: 73 mg/dL (ref 70–99)
Potassium: 4.3 mmol/L (ref 3.5–5.1)
Sodium: 138 mmol/L (ref 135–145)

## 2021-11-25 ENCOUNTER — Ambulatory Visit (INDEPENDENT_AMBULATORY_CARE_PROVIDER_SITE_OTHER): Payer: Medicare Other

## 2021-11-25 DIAGNOSIS — I5032 Chronic diastolic (congestive) heart failure: Secondary | ICD-10-CM | POA: Diagnosis not present

## 2021-11-25 LAB — ECHOCARDIOGRAM LIMITED
AR max vel: 1.19 cm2
AV Area VTI: 1.18 cm2
AV Area mean vel: 1.26 cm2
AV Mean grad: 19 mmHg
AV Peak grad: 33.4 mmHg
Ao pk vel: 2.89 m/s
Calc EF: 53.9 %
P 1/2 time: 510 msec
S' Lateral: 3.3 cm
Single Plane A2C EF: 52.5 %
Single Plane A4C EF: 55.4 %

## 2021-11-28 ENCOUNTER — Telehealth: Payer: Self-pay

## 2021-11-28 NOTE — Telephone Encounter (Signed)
Called to give the patient echo results. DPR on file. lmom with results. Patient is to contact the office if any questions.. 

## 2021-11-30 MED ORDER — AMIODARONE HCL 200 MG PO TABS: 200.0000 mg | ORAL_TABLET | Freq: Every day | ORAL | 0 refills | Status: AC

## 2022-02-08 ENCOUNTER — Encounter: Payer: Self-pay | Admitting: Medical

## 2022-02-08 ENCOUNTER — Ambulatory Visit: Payer: Medicare Other | Attending: Medical | Admitting: Medical

## 2022-02-08 VITALS — BP 120/63 | HR 67 | Ht 72.0 in | Wt 213.2 lb

## 2022-02-08 DIAGNOSIS — I10 Essential (primary) hypertension: Secondary | ICD-10-CM | POA: Diagnosis present

## 2022-02-08 DIAGNOSIS — I4819 Other persistent atrial fibrillation: Secondary | ICD-10-CM | POA: Insufficient documentation

## 2022-02-08 DIAGNOSIS — Q210 Ventricular septal defect: Secondary | ICD-10-CM | POA: Insufficient documentation

## 2022-02-08 DIAGNOSIS — I2510 Atherosclerotic heart disease of native coronary artery without angina pectoris: Secondary | ICD-10-CM | POA: Diagnosis not present

## 2022-02-08 DIAGNOSIS — I25118 Atherosclerotic heart disease of native coronary artery with other forms of angina pectoris: Secondary | ICD-10-CM | POA: Diagnosis not present

## 2022-02-08 DIAGNOSIS — I429 Cardiomyopathy, unspecified: Secondary | ICD-10-CM | POA: Insufficient documentation

## 2022-02-08 NOTE — Patient Instructions (Signed)
Medication Instructions:  Your physician recommends that you continue on your current medications as directed. Please refer to the Current Medication list given to you today.  *If you need a refill on your cardiac medications before your next appointment, please call your pharmacy*   Lab Work: None ordered If you have labs (blood work) drawn today and your tests are completely normal, you will receive your results only by: [place] (if you have MyChart) OR A paper copy in the mail If you have any lab test that is abnormal or we need to change your treatment, we will call you to review the results.   Testing/Procedures: None ordered   Follow-Up: At [facility], you and your health needs are our priority.  As part of our continuing mission to provide you with exceptional heart care, we have created designated Provider Care Teams.  These Care Teams include your primary Cardiologist (physician) and Advanced Practice Providers (APPs -  Physician Assistants and Nurse Practitioners) who all work together to provide you with the care you need, when you need it.  We recommend signing up for the patient portal called "MyChart".  Sign up information is provided on this After Visit Summary.  MyChart is used to connect with patients for Virtual Visits (Telemedicine).  Patients are able to view lab/test results, encounter notes, upcoming appointments, etc.  Non-urgent messages can be sent to your provider as well.   To learn more about what you can do with MyChart, go to [email or web address].    Your next appointment:   3 month(s)  The format for your next appointment:   In Person  Provider:   You may see Nelva Bush, MD or one of the following Advanced Practice Providers on your designated Care Team:   Murray Hodgkins, NP Christell Faith, PA-C Cadence Kathlen Mody, PA-C Gerrie Nordmann, NP  Other Instructions N/A  Important Information About Sugar

## 2022-02-08 NOTE — Progress Notes (Signed)
Cardiology Office Note:    Date:  02/08/2022   ID:  Aaron Jones, DOB September 15, 1945, MRN [id number]  PCP:  Kathlyn Sacramento, MD  [facility] Cardiologist:  Nelva Bush, MD  [facility] Electrophysiologist:  Cristopher Peru, MD   Referring MD: Kathlyn Sacramento, MD   Chief Complaint: 2 month follow-up  History of Present Illness:    Aaron Jones is a 76 y.o. male with a hx of paroxysmal Afib s/p MAZE procedure and left atrial appendage clipping, CAD s/p CABGx1 (SVG-PDA), VSD s/p open repair with Dacron patch, paroxysmal atrial tachycardia, thoracic aortic anuerysm, HTN, HLD, DM2, OSA who presents for 2 month follow-up.   Echo 12/2020 showed LVEF 45-50%, global HK, moderate LVH, severely dilated LA, moderately dilated RA, trivial MR, ascending aorta 41m.    He was previously referred by EP for VSD. He was seen 07/28/21 reporting SOB and fatigue. He was set up for cardiac cath. Catheterization showed moderate to severe but non-critical disease involving ostial D1, rmaus intermedius, and rPDA, patent SVG-rPDA, mildly elevated left heart filling pressures, severely elevated right heart pressures, moderate pulmonary HTN, low normal reduced CO, no shunting. Imdur was added. Lasix was increased to '40mg'$  daily.    Seen 10/07/21 and was overall doing well. FWilder Gladewas stopped for worsening kidney function. He was started on spironolactone.  He was last seen 11/04/21 and was overall doing well. BMET was ordered. Limited echo was repeated. This showed LVEF 55-60%, no WMA, moderate LVH, severely dilated LA, moderate AS.  Today, the patient reports he is feeling better. Breathing and activity tolerance is much better. He is able to mow his yard. He denies chest pain. No chest pain or SOB. He has some welling since he fell. He was walking the neighbors dog home and the dog tripped him and he hurt his left foot. Has some extra swelling on the left lower extremity. Also had other scrapes. Follow-up echo  was reviewed.   Past Medical History:  Diagnosis Date   Atrial fibrillation with rapid ventricular response ([place] 01/16/2013   a. 01/2013 s/p R & L Maze and LAA clipping in setting of VSD repair;  b. Prev on amiodarone;  c. CHA2DS2VASc = 4-->Apixaban;  c. 01/2013 Recurrent afib/flutter requiring DCCV.   CHF (congestive heart failure) (HCC)    Colon polyp    COPD (chronic obstructive pulmonary disease) ([place]    Coronary artery disease    a. 01/2013 s/p CABG x 1 (VG->PDA) in setting of VSD repair.   GERD (gastroesophageal reflux disease)    Hyperlipidemia    Hypertension    [place]; [phone]  Mitral valve disorder    Obesity    PAT (paroxysmal atrial tachycardia) ([place]    a. 02/2015 s/p DCCV;  b. 12/2016 recurrent PAT - unchanged with escalating BB dose (rates 106-107).   PSVT (paroxysmal supraventricular tachycardia) ([place]    [facility]. [facility]broke with IV dilt.   Recurrent Nephrolithiasis    Schatzki's ring    Sleep apnea    [facility]- in process of being evaluated now, states 12 yrs. ago was on CPAP but after having his deviated septum repaired, he had put the CPAP in storage. Pt. will get a new machine soon.    Thoracic aortic aneurysm ([place]    a. 01/2016 CTA chest: 4.0 cm Asc Ao; b. 01/2017 Echo: nl Ao root size.   Type II diabetes mellitus ([place]    Umbilical  hernia    "not repaired" (01/16/2013)   VSD (ventricular septal defect) w/ persistent systolic murmur    a. 11/2945 s/p VSD closure (dacron patch);  b. 01/2017 Echo: EF 50-55%, no rwma, mild AS, mild MR, mod dil LA, dilated RV, sev dil RA, mild TR. PASP nl.    Past Surgical History:  Procedure Laterality Date   CARDIAC CATHETERIZATION  > 5 yr ago   [facility] at [facility], reportedly clean   CARDIAC CATHETERIZATION  11/03/2012   CARDIOVERSION N/A 02/12/2015   Procedure: CARDIOVERSION;  Surgeon: Thayer Headings, MD;  Location: [place];  Service: Cardiovascular;  Laterality: N/A;    CARDIOVERSION N/A 04/27/2017   Procedure: CARDIOVERSION;  Surgeon: Evans Lance, MD;  Location: [place] CV LAB;  Service: Cardiovascular;  Laterality: N/A;   CLIPPING OF ATRIAL APPENDAGE N/A 01/07/2013   Procedure: CLIPPING OF ATRIAL APPENDAGE;  Surgeon: Grace Isaac, MD;  Location: [place];  Service: Open Heart Surgery;  Laterality: N/A;   COLONOSCOPY W/ POLYPECTOMY     COLONOSCOPY WITH PROPOFOL N/A 01/05/2021   Procedure: COLONOSCOPY WITH PROPOFOL;  Surgeon: Toledo, Benay Pike, MD;  Location: [facility] ENDOSCOPY;  Service: Gastroenterology;  Laterality: N/A;   CORONARY ARTERY BYPASS GRAFT N/A 01/07/2013   Procedure: CORONARY ARTERY BYPASS GRAFTING (CABG);  Surgeon: Grace Isaac, MD;  Location: [place];  Service: Open Heart Surgery;  Laterality: N/A;  Times1 using endoscopically harvested saphenous vein graft to the PDA   ESOPHAGOGASTRODUODENOSCOPY N/A 01/05/2021   Procedure: ESOPHAGOGASTRODUODENOSCOPY (EGD);  Surgeon: Toledo, Benay Pike, MD;  Location: [facility] ENDOSCOPY;  Service: Gastroenterology;  Laterality: N/A;  DM   EYE SURGERY Left 02/03/1989   "clipped muscle so it wouldn't be pulling up" (01/16/2013)   INTRAOPERATIVE TRANSESOPHAGEAL ECHOCARDIOGRAM N/A 01/07/2013   Procedure: INTRAOPERATIVE TRANSESOPHAGEAL ECHOCARDIOGRAM;  Surgeon: Grace Isaac, MD;  Location: [place];  Service: Open Heart Surgery;  Laterality: N/A;   LEFT AND RIGHT HEART CATHETERIZATION WITH CORONARY ANGIOGRAM N/A 11/20/2012   Procedure: LEFT AND RIGHT HEART CATHETERIZATION WITH CORONARY ANGIOGRAM;  Surgeon: Jolaine Artist, MD;  Location: [facility] CATH LAB;  Service: Cardiovascular;  Laterality: N/A;   MAZE N/A 01/07/2013   Procedure: MAZE;  Surgeon: Grace Isaac, MD;  Location: [place];  Service: Open Heart Surgery;  Laterality: N/A;   MULTIPLE EXTRACTIONS WITH ALVEOLOPLASTY N/A 12/11/2012   Procedure: Extraction of tooth #'s 6,5,4,6,50,35,46,56,81,27,51,70,01,74,94 wioth alveoloplasty and bialteral fibrous  tuberosity reductions.;  Surgeon: Lenn Cal, DDS;  Location: [facility];  Service: Oral Surgery;  Laterality: N/A;   NASAL SEPTUM SURGERY  06/05/2002   RIGHT/LEFT HEART CATH AND CORONARY ANGIOGRAPHY N/A 08/02/2021   Procedure: RIGHT/LEFT HEART CATH AND CORONARY ANGIOGRAPHY;  Surgeon: Nelva Bush, MD;  Location: [place] CV LAB;  Service: Cardiovascular;  Laterality: N/A;   sepfal deveation repair     TEE WITHOUT CARDIOVERSION N/A 11/21/2012   Procedure: TRANSESOPHAGEAL ECHOCARDIOGRAM (TEE);  Surgeon: Jolaine Artist, MD;  Location[facility] ENDOSCOPY;  Service: Cardiovascular;  Laterality: N/A;   VSD REPAIR N/A 01/07/2013   Procedure: VENTRICULAR SEPTAL DEFECT (VSD) REPAIR;  Surgeon: Grace Isaac, MD;  Location: [place];  Service: Open Heart Surgery;  Laterality: N/A;   VSD REPAIR      Current Medications: Current Meds  Medication Sig   acetaminophen (TYLENOL) 500 MG tablet Take 1,000 mg by mouth daily as needed for moderate pain or headache.   amiodarone (PACERONE) 200 MG tablet Take 1 tablet (200 mg total) by mouth daily.   apixaban (  ELIQUIS) 5 MG TABS tablet Take 1 tablet (5 mg total) by mouth 2 (two) times daily.   atorvastatin (LIPITOR) 20 MG tablet Take 1 tablet (20 mg total) by mouth at bedtime.   furosemide (LASIX) 40 MG tablet Take 1 tablet (40 mg total) by mouth daily.   glipiZIDE (GLUCOTROL) 10 MG tablet Take 5-10 mg by mouth See admin instructions. '10mg'$  in the am and '5mg'$  in the pm   Glucose Blood (BLOOD GLUCOSE TEST STRIPS) STRP 1 strip by In Vitro route 2 (two) times daily.   isosorbide mononitrate (IMDUR) 30 MG 24 hr tablet Take 0.5 tablets (15 mg total) by mouth daily.   losartan (COZAAR) 25 MG tablet Take 1 tablet (25 mg total) by mouth daily.   metFORMIN (GLUCOPHAGE) 1000 MG tablet Take 1,000 mg by mouth 2 (two) times daily.   metoprolol succinate (TOPROL-XL) 100 MG 24 hr tablet Take 1 tablet (100 mg total) by mouth at bedtime. Take with or immediately following  a meal.   omeprazole (PRILOSEC) 40 MG capsule Take 40 mg by mouth daily before breakfast.    pioglitazone (ACTOS) 30 MG tablet Take 30 mg by mouth daily.   sertraline (ZOLOFT) 50 MG tablet Take 50 mg by mouth daily.     Allergies:   Biaxin [clarithromycin]   Social History   Socioeconomic History   Marital status: Married    Spouse name: Not on file   Number of children: Not on file   Years of education: Not on file   Highest education level: Not on file  Occupational History   Occupation: Manufacturing    Comment: Heavy work at times  Tobacco Use   Smoking status: Never   Smokeless tobacco: Never  Vaping Use   Vaping Use: Never used  Substance and Sexual Activity   Alcohol use: No   Drug use: No   Sexual activity: Not Currently  Other Topics Concern   Not on file  Social History Narrative   Still works full time. Works around the yard. Lives with wife. Has 2 sisters, neither with cardiac issues.   Social Determinants of Health   Financial Resource Strain: Not on file  Food Insecurity: Not on file  Transportation Needs: Not on file  Physical Activity: Not on file  Stress: Not on file  Social Connections: Not on file     Family History: The patient's family history includes Cancer in his mother; Diabetes in his mother.  ROS:   Please see the history of present illness.     All other systems reviewed and are negative.  EKGs/Labs/Other Studies Reviewed:    The following studies were reviewed today:  Echo 11/2021  1. Left ventricular ejection fraction, by estimation, is 55 to 60%. The  left ventricle has normal function. The left ventricle has no regional  wall motion abnormalities. There is moderate concentric left ventricular  hypertrophy. The average left  ventricular global longitudinal strain is -14.3 %.   2. s/p Dacron Patch VSD repair. There is a moderately sized membranous  ventricular septal defect with left to right shunting, peak gradient 137  mm  Hg.   3. Right ventricular systolic function is low normal. The right  ventricular size is normal. Moderately increased right ventricular wall  thickness. There is mildly elevated pulmonary artery systolic pressure.  The estimated right ventricular systolic  pressure is 01.7 mmHg.   4. Left atrial size was severely dilated.   5. The mitral valve is normal in structure. No  evidence of mitral valve  regurgitation. No evidence of mitral stenosis.   6. The aortic valve is normal in structure. Aortic valve regurgitation is  mild. Moderate aortic valve stenosis. Aortic valve area, by VTI measures  1.18 cm. Aortic valve mean gradient measures 19.0 mmHg. Aortic valve Vmax  measures 2.89 m/s.   7. The inferior vena cava is normal in size with greater than 50%  respiratory variability, suggesting right atrial pressure of 3 mmHg.   R/L Cardiac cath 07/2019 Conclusions: Moderate-severe but non-critical disease involving ostial D1, ramus intermedius, and rPDA. Widely patent SVG-rPDA. Mildly elevated left heart filling pressures with prominent V-waves noted on PCWP tracing (PCWP 26 mmHg; LVEDP 20-25 mmHg). Severely elevated right heart filling pressures (RAP/RVEDP 16 mmHg). Moderate pulmonary hypertension (mean PAP 40 mmHg; PVR 2.8 WU). Low normal to mildly reduced Fick cardiac output/index (CO 4.9 L/min, CI 2.3 L/min/m). No significant intracardiac shunting (QPS 1.0-1.2).   Recommendations: Escalate diuresis: We will increase furosemide to 40 mg twice daily. Add isosorbide mononitrate 15 mg daily for antianginal therapy. Increased goal-directed medical therapy for nonischemic cardiomyopathy as an outpatient, as tolerated. Continue clinical and echocardiographic monitoring of VSD; shunting does not appear to be significant at this time. Consider referral to advanced heart failure clinic if symptoms persist. Restart apixaban tomorrow morning if no evidence of bleeding/vascular injury.    Nelva Bush, MD [facility]   Heart monitor 05/2021  Study Highlights   HR 52 - 231, average 77 bpm. No atrial fibrillation detected. 47 SVT, longest lasting 19 beats. Occasional ventricular ectopy, 3.7%. Rare supraventricular ectopy, 4.1%. No sustained arrhythmias.   Lysbeth Galas T. Quentin Ore, MD, [facility], [facility] Cardiac Electrophysiology   Echo 12/2020  1. Rhythm appears to be atrial flutter - no A waves noted.   2. Prior dacron patch VSD repair is compromised with left to right  shunting noted at the membranous septum (images 35, 45) - peak gradient is  409 mmHg (systolic BP). The neck of the fenestration measures about 0.5  cm. Left ventricular ejection fraction,   by estimation, is 45 to 50%. The left ventricle has mildly decreased  function. The left ventricle demonstrates global hypokinesis. There is  moderate left ventricular hypertrophy. Left ventricular diastolic function  could not be evaluated.   3. Right ventricular systolic function is normal. The right ventricular  size is normal. There is mildly elevated pulmonary artery systolic  pressure. The estimated right ventricular systolic pressure is 81.1 mmHg.   4. Left atrial size was severely dilated.   5. Right atrial size was moderately dilated.   6. The mitral valve is abnormal. Trivial mitral valve regurgitation.  Moderate mitral annular calcification.   7. Tricuspid valve regurgitation is moderate.   8. The aortic valve is abnormal. Aortic valve regurgitation is trivial.   9. Aortic dilatation noted. There is borderline dilatation of the  ascending aorta, measuring 38 mm.  10. The inferior vena cava is normal in size with <50% respiratory  variability, suggesting right atrial pressure of 8 mmHg.   Comparison(s): Changes from prior study are noted. 06/14/2018: LVEF  55-60%,.      EKG:  EKG is ordered today.  The ekg ordered today demonstrates NSR, PVCs, RBBB, LAFB, LVH with repolarization abnormality  Recent  Labs: 07/28/2021: Hemoglobin 12.0; Platelets 249 11/10/2021: BUN 16; Creatinine, Ser 1.20; Potassium 4.3; Sodium 138  Recent Lipid Panel    Component Value Date/Time   CHOL 142 04/17/2013 0833   CHOL 192 10/29/2011 0429   TRIG  125 04/17/2013 0833   TRIG 150 10/29/2011 0429   HDL 41 04/17/2013 0833   HDL 32 (L) 10/29/2011 0429   CHOLHDL 3.5 04/17/2013 0833   VLDL 30 10/29/2011 0429   LDLCALC 76 04/17/2013 0833   LDLCALC 130 (H) 10/29/2011 0429    Physical Exam:    VS:  BP 120/63 (BP Location: Left Arm, Patient Position: Sitting, Cuff Size: Normal)   Pulse 67   Ht 6' (1.829 m)   Wt 213 lb 3.2 oz (96.7 kg)   SpO2 97%   BMI 28.92 kg/m     Wt Readings from Last 3 Encounters:  02/08/22 213 lb 3.2 oz (96.7 kg)  11/04/21 212 lb (96.2 kg)  10/07/21 213 lb (96.6 kg)     GEN:  Well nourished, well developed in no acute distress HEENT: Normal NECK: No JVD; No carotid bruits LYMPHATICS: No lymphadenopathy CARDIAC: RRR, no murmurs, rubs, gallops RESPIRATORY:  Clear to auscultation without rales, wheezing or rhonchi  ABDOMEN: Soft, non-tender, non-distended MUSCULOSKELETAL:  No edema; No deformity  SKIN: Warm and dry NEUROLOGIC:  Alert and oriented x 3 PSYCHIATRIC:  Normal affect   ASSESSMENT:    1. Coronary artery disease of native artery of native heart with stable angina pectoris ([place])   2. Cardiomyopathy, unspecified type ([place])   3. VSD (ventricular septal defect)   4. Coronary artery disease involving native coronary artery of native heart without angina pectoris   5. Essential hypertension   6. 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 Repeat echo showed improved EF 55-60% The patient is euvolemic on exam. Activity tolerance is improving. Continue Losartan '25mg'$  daily and Toprol '100mg'$  daily. He did not tolerate Farxiga or spironolactone. Continue lasix '40mg'$  daily.    VSD s/o  repair Prior heart cath showed no shunting. Per Dr. Saunders Revel, no further work-up planned.   CAD Patient denies anginal symptoms. LHC with non-critical CAD< report above. Continue Imdur '15mg'$  daily, Toprol '100mg'$  daily, and Lipitor '20mg'$  daily.   HTN BP is good today. Continue current medications.   Persistent Afib He is in NSR with PVCs, heart rate of 67bpm. Continue amiodarone '200mg'$  daily and Toprol '100mg'$  daily.   Disposition: Follow up in 3 month(s) with MD/APP    Signed, Masiel Gentzler Ninfa Meeker, PA-C  02/08/2022 4:28 PM    [facility]

## 2022-05-12 ENCOUNTER — Encounter: Payer: Self-pay | Admitting: Medical

## 2022-05-12 ENCOUNTER — Ambulatory Visit: Payer: Medicare Other | Attending: Medical | Admitting: Medical

## 2022-05-12 VITALS — BP 130/70 | HR 51 | Ht 73.0 in | Wt 212.0 lb

## 2022-05-12 DIAGNOSIS — I25118 Atherosclerotic heart disease of native coronary artery with other forms of angina pectoris: Secondary | ICD-10-CM | POA: Diagnosis not present

## 2022-05-12 DIAGNOSIS — I4819 Other persistent atrial fibrillation: Secondary | ICD-10-CM | POA: Diagnosis not present

## 2022-05-12 DIAGNOSIS — Q210 Ventricular septal defect: Secondary | ICD-10-CM | POA: Diagnosis not present

## 2022-05-12 DIAGNOSIS — I429 Cardiomyopathy, unspecified: Secondary | ICD-10-CM

## 2022-05-12 MED ORDER — METOPROLOL SUCCINATE ER 50 MG PO TB24: ORAL_TABLET | ORAL | 3 refills | Status: DC

## 2022-05-12 NOTE — Patient Instructions (Addendum)
Medication Instructions:  - Your physician has recommended you make the following change in your medication:   1) DECREASE Metoprolol Succinate to a 50 mg tablet: - take 1.5 tablets (75 mg) once daily   *If you need a refill on your cardiac medications before your next appointment, please call your pharmacy*   Lab Work: - none ordered  If you have labs (blood work) drawn today and your tests are completely normal, you will receive your results only by: [place] (if you have MyChart) OR A paper copy in the mail If you have any lab test that is abnormal or we need to change your treatment, we will call you to review the results.   Testing/Procedures: - none ordered    Follow-Up: At [facility], you and your health needs are our priority.  As part of our continuing mission to provide you with exceptional heart care, we have created designated Provider Care Teams.  These Care Teams include your primary Cardiologist (physician) and Advanced Practice Providers (APPs -  Physician Assistants and Nurse Practitioners) who all work together to provide you with the care you need, when you need it.  We recommend signing up for the patient portal called "MyChart".  Sign up information is provided on this After Visit Summary.  MyChart is used to connect with patients for Virtual Visits (Telemedicine).  Patients are able to view lab/test results, encounter notes, upcoming appointments, etc.  Non-urgent messages can be sent to your provider as well.   To learn more about what you can do with MyChart, go to [email or web address].    Your next appointment:   1) next available with Dr. Quentin Ore- follow up A-fib   2) 4-6  month(s) with Dr. Janeice Robinson, PA  The format for your next appointment:   In Person  Provider:   As above     Other Instructions N/a  Important Information About Sugar

## 2022-05-12 NOTE — Progress Notes (Signed)
Cardiology Office Note:    Date:  05/12/2022   ID:  Aaron Jones, DOB 1946/05/04, MRN [id number]  PCP:  Kathlyn Sacramento, MD  [facility] Cardiologist:  Nelva Bush, MD  [facility] Electrophysiologist:  Cristopher Peru, MD   Referring MD: Kathlyn Sacramento, MD   Chief Complaint: 3 month follow-up  History of Present Illness:    Aaron Jones is a 76 y.o. male with a hx of paroxysmal Afib s/p MAZE procedure and left atrial appendage clipping, CAD s/p CABGx1 (SVG-PDA), VSD s/p open repair with Dacron patch, paroxysmal atrial tachycardia, thoracic aortic anuerysm, HTN, HLD, DM2, OSA who presents for 2 month follow-up.    Echo 12/2020 showed LVEF 45-50%, global HK, moderate LVH, severely dilated LA, moderately dilated RA, trivial MR, ascending aorta 48m.    He was previously referred by EP for VSD. He was seen 07/28/21 reporting SOB and fatigue. He was set up for cardiac cath. Catheterization showed moderate to severe but non-critical disease involving ostial D1, rmaus intermedius, and rPDA, patent SVG-rPDA, mildly elevated left heart filling pressures, severely elevated right heart pressures, moderate pulmonary HTN, low normal reduced CO, no shunting. Imdur was added. Lasix was increased to '40mg'$  daily.    Seen 10/07/21 and was overall doing well. FWilder Gladewas stopped for worsening kidney function. He was started on spironolactone.   seen 11/04/21 and was overall doing well. BMET was ordered. Limited echo was repeated. This showed LVEF 55-60%, no WMA, moderate LVH, severely dilated LA, moderate AS.   Last seen 02/08/22 and was stable from a cardiac perspective.  Today, the patient reports he has been sleepy during the day. He says he stays up at night to watch his wife who has MS. He sleeps during the day. He denies h/o OSA. He denies chest pain, shortness of breath, lower leg edema, orthopnea, pnd, lightheadedness or dizziness. EKG shows SB, HR 51bpm, PVCs, appears to have p-waves  Past  Medical History:  Diagnosis Date   Atrial fibrillation with rapid ventricular response ([facility] 01/16/2013   a. 01/2013 s/p R & L Maze and LAA clipping in setting of VSD repair;  b. Prev on amiodarone;  c. CHA2DS2VASc = 4-->Apixaban;  c. 01/2013 Recurrent afib/flutter requiring DCCV.   CHF (congestive heart failure) (HCC)    Colon polyp    COPD (chronic obstructive pulmonary disease) ([place]    Coronary artery disease    a. 01/2013 s/p CABG x 1 (VG->PDA) in setting of VSD repair.   GERD (gastroesophageal reflux disease)    Hyperlipidemia    Hypertension    [place]- [place] phone; [phone]  Mitral valve disorder    Obesity    PAT (paroxysmal atrial tachycardia)    a. 02/2015 s/p DCCV;  b. 12/2016 recurrent PAT - unchanged with escalating BB dose (rates 106-107).   PSVT (paroxysmal supraventricular tachycardia)    [facility] with IV dilt.   Recurrent Nephrolithiasis    Schatzki's ring    Sleep apnea    [facility]- in process of being evaluated now, states 12 yrs. ago was on CPAP but after having his deviated septum repaired, he had put the CPAP in storage. Pt. will get a new machine soon.    Thoracic aortic aneurysm ([place]    a. 01/2016 CTA chest: 4.0 cm Asc Ao; b. 01/2017 Echo: nl Ao root size.   Type II diabetes mellitus ([facility]    Umbilical hernia    "not repaired" (  01/16/2013)   VSD (ventricular septal defect) w/ persistent systolic murmur    a. 06/9415 s/p VSD closure (dacron patch);  b. 01/2017 Echo: EF 50-55%, no rwma, mild AS, mild MR, mod dil LA, dilated RV, sev dil RA, mild TR. PASP nl.    Past Surgical History:  Procedure Laterality Date   CARDIAC CATHETERIZATION  > 5 yr ago   68 at [facility], reportedly clean   CARDIAC CATHETERIZATION  11/03/2012   CARDIOVERSION N/A 02/12/2015   Procedure: CARDIOVERSION;  Surgeon: Thayer Headings, MD;  Location: [place];  Service: Cardiovascular;  Laterality: N/A;   CARDIOVERSION N/A 04/27/2017   Procedure:  CARDIOVERSION;  Surgeon: Evans Lance, MD;  Location: [place] CV LAB;  Service: Cardiovascular;  Laterality: N/A;   CLIPPING OF ATRIAL APPENDAGE N/A 01/07/2013   Procedure: CLIPPING OF ATRIAL APPENDAGE;  Surgeon: Grace Isaac, MD;  Location: [place];  Service: Open Heart Surgery;  Laterality: N/A;   COLONOSCOPY W/ POLYPECTOMY     COLONOSCOPY WITH PROPOFOL N/A 01/05/2021   Procedure: COLONOSCOPY WITH PROPOFOL;  Surgeon: Toledo, Benay Pike, MD;  Location: [facility] ENDOSCOPY;  Service: Gastroenterology;  Laterality: N/A;   CORONARY ARTERY BYPASS GRAFT N/A 01/07/2013   Procedure: CORONARY ARTERY BYPASS GRAFTING (CABG);  Surgeon: Grace Isaac, MD;  Location: [place];  Service: Open Heart Surgery;  Laterality: N/A;  Times1 using endoscopically harvested saphenous vein graft to the PDA   ESOPHAGOGASTRODUODENOSCOPY N/A 01/05/2021   Procedure: ESOPHAGOGASTRODUODENOSCOPY (EGD);  Surgeon: Toledo, Benay Pike, MD;  Location: [facility] ENDOSCOPY;  Service: Gastroenterology;  Laterality: N/A;  DM   EYE SURGERY Left 02/03/1989   "clipped muscle so it wouldn't be pulling up" (01/16/2013)   INTRAOPERATIVE TRANSESOPHAGEAL ECHOCARDIOGRAM N/A 01/07/2013   Procedure: INTRAOPERATIVE TRANSESOPHAGEAL ECHOCARDIOGRAM;  Surgeon: Grace Isaac, MD;  Location: [place];  Service: Open Heart Surgery;  Laterality: N/A;   LEFT AND RIGHT HEART CATHETERIZATION WITH CORONARY ANGIOGRAM N/A 11/20/2012   Procedure: LEFT AND RIGHT HEART CATHETERIZATION WITH CORONARY ANGIOGRAM;  Surgeon: Jolaine Artist, MD;  Location: [facility] CATH LAB;  Service: Cardiovascular;  Laterality: N/A;   MAZE N/A 01/07/2013   Procedure: MAZE;  Surgeon: Grace Isaac, MD;  Location: [place];  Service: Open Heart Surgery;  Laterality: N/A;   MULTIPLE EXTRACTIONS WITH ALVEOLOPLASTY N/A 12/11/2012   Procedure: Extraction of tooth #'s 4,0,8,1,44,81,85,63,14,97,02,63,78,58,85 wioth alveoloplasty and bialteral fibrous tuberosity reductions.;  Surgeon: Lenn Cal, DDS;  Location: [facility];  Service: Oral Surgery;  Laterality: N/A;   NASAL SEPTUM SURGERY  06/05/2002   RIGHT/LEFT HEART CATH AND CORONARY ANGIOGRAPHY N/A 08/02/2021   Procedure: RIGHT/LEFT HEART CATH AND CORONARY ANGIOGRAPHY;  Surgeon: Nelva Bush, MD;  Location: [place] CV LAB;  Service: Cardiovascular;  Laterality: N/A;   sepfal deveation repair     TEE WITHOUT CARDIOVERSION N/A 11/21/2012   Procedure: TRANSESOPHAGEAL ECHOCARDIOGRAM (TEE);  Surgeon: Jolaine Artist, MD;  Location: [facility] ENDOSCOPY;  Service: Cardiovascular;  Laterality: N/A;   VSD REPAIR N/A 01/07/2013   Procedure: VENTRICULAR SEPTAL DEFECT (VSD) REPAIR;  Surgeon: Grace Isaac, MD;  Location: [place];  Service: Open Heart Surgery;  Laterality: N/A;   VSD REPAIR      Current Medications: Current Meds  Medication Sig   acetaminophen (TYLENOL) 500 MG tablet Take 1,000 mg by mouth daily as needed for moderate pain or headache.   amiodarone (PACERONE) 200 MG tablet Take 1 tablet (200 mg total) by mouth daily.   apixaban (ELIQUIS) 5 MG TABS tablet Take  1 tablet (5 mg total) by mouth 2 (two) times daily.   atorvastatin (LIPITOR) 20 MG tablet Take 1 tablet (20 mg total) by mouth at bedtime.   furosemide (LASIX) 40 MG tablet Take 1 tablet (40 mg total) by mouth daily.   glipiZIDE (GLUCOTROL) 10 MG tablet Take 5-10 mg by mouth See admin instructions. '10mg'$  in the am and '5mg'$  in the pm   Glucose Blood (BLOOD GLUCOSE TEST STRIPS) STRP 1 strip by In Vitro route 2 (two) times daily.   isosorbide mononitrate (IMDUR) 30 MG 24 hr tablet Take 0.5 tablets (15 mg total) by mouth daily.   losartan (COZAAR) 25 MG tablet Take 1 tablet (25 mg total) by mouth daily.   metFORMIN (GLUCOPHAGE) 1000 MG tablet Take 1,000 mg by mouth 2 (two) times daily.   metoprolol succinate (TOPROL-XL) 50 MG 24 hr tablet Take 1.5 tablets (75 mg) by mouth once daily. Take with or immediately following a meal.   omeprazole (PRILOSEC) 40 MG capsule  Take 40 mg by mouth daily before breakfast.    pioglitazone (ACTOS) 30 MG tablet Take 30 mg by mouth daily.   sertraline (ZOLOFT) 50 MG tablet Take 50 mg by mouth daily.   traMADol (ULTRAM) 50 MG tablet Take One tab PO Q6 hours PRN pain   [DISCONTINUED] metoprolol succinate (TOPROL-XL) 100 MG 24 hr tablet Take 1 tablet (100 mg total) by mouth at bedtime. Take with or immediately following a meal.     Allergies:   Biaxin [clarithromycin]   Social History   Socioeconomic History   Marital status: Married    Spouse name: Not on file   Number of children: Not on file   Years of education: Not on file   Highest education level: Not on file  Occupational History   Occupation: Manufacturing    Comment: Heavy work at times  Tobacco Use   Smoking status: Never   Smokeless tobacco: Never  Vaping Use   Vaping Use: Never used  Substance and Sexual Activity   Alcohol use: No   Drug use: No   Sexual activity: Not Currently  Other Topics Concern   Not on file  Social History Narrative   Still works full time. Works around the yard. Lives with wife. Has 2 sisters, neither with cardiac issues.   Social Determinants of Health   Financial Resource Strain: Not on file  Food Insecurity: Not on file  Transportation Needs: Not on file  Physical Activity: Not on file  Stress: Not on file  Social Connections: Not on file     Family History: The patient's family history includes Cancer in his mother; Diabetes in his mother.  ROS:   Please see the history of present illness.     All other systems reviewed and are negative.  EKGs/Labs/Other Studies Reviewed:    The following studies were reviewed today:    Echo 11/2021  1. Left ventricular ejection fraction, by estimation, is 55 to 60%. The  left ventricle has normal function. The left ventricle has no regional  wall motion abnormalities. There is moderate concentric left ventricular  hypertrophy. The average left  ventricular global  longitudinal strain is -14.3 %.   2. s/p Dacron Patch VSD repair. There is a moderately sized membranous  ventricular septal defect with left to right shunting, peak gradient 137  mm Hg.   3. Right ventricular systolic function is low normal. The right  ventricular size is normal. Moderately increased right ventricular wall  thickness. There  is mildly elevated pulmonary artery systolic pressure.  The estimated right ventricular systolic  pressure is 36.6 mmHg.   4. Left atrial size was severely dilated.   5. The mitral valve is normal in structure. No evidence of mitral valve  regurgitation. No evidence of mitral stenosis.   6. The aortic valve is normal in structure. Aortic valve regurgitation is  mild. Moderate aortic valve stenosis. Aortic valve area, by VTI measures  1.18 cm. Aortic valve mean gradient measures 19.0 mmHg. Aortic valve Vmax  measures 2.89 m/s.   7. The inferior vena cava is normal in size with greater than 50%  respiratory variability, suggesting right atrial pressure of 3 mmHg.    R/L Cardiac cath 07/2019 Conclusions: Moderate-severe but non-critical disease involving ostial D1, ramus intermedius, and rPDA. Widely patent SVG-rPDA. Mildly elevated left heart filling pressures with prominent V-waves noted on PCWP tracing (PCWP 26 mmHg; LVEDP 20-25 mmHg). Severely elevated right heart filling pressures (RAP/RVEDP 16 mmHg). Moderate pulmonary hypertension (mean PAP 40 mmHg; PVR 2.8 WU). Low normal to mildly reduced Fick cardiac output/index (CO 4.9 L/min, CI 2.3 L/min/m). No significant intracardiac shunting (QPS 1.0-1.2).   Recommendations: Escalate diuresis: We will increase furosemide to 40 mg twice daily. Add isosorbide mononitrate 15 mg daily for antianginal therapy. Increased goal-directed medical therapy for nonischemic cardiomyopathy as an outpatient, as tolerated. Continue clinical and echocardiographic monitoring of VSD; shunting does not appear to be  significant at this time. Consider referral to advanced heart failure clinic if symptoms persist. Restart apixaban tomorrow morning if no evidence of bleeding/vascular injury.   Nelva Bush, MD [facility]   Heart monitor 05/2021  Study Highlights   HR 52 - 231, average 77 bpm. No atrial fibrillation detected. 47 SVT, longest lasting 19 beats. Occasional ventricular ectopy, 3.7%. Rare supraventricular ectopy, 4.1%. No sustained arrhythmias.   Lysbeth Galas T. Quentin Ore, MD, [facility], [facility] Cardiac Electrophysiology   Echo 12/2020  1. Rhythm appears to be atrial flutter - no A waves noted.   2. Prior dacron patch VSD repair is compromised with left to right  shunting noted at the membranous septum (images 35, 45) - peak gradient is  294 mmHg (systolic BP). The neck of the fenestration measures about 0.5  cm. Left ventricular ejection fraction,   by estimation, is 45 to 50%. The left ventricle has mildly decreased  function. The left ventricle demonstrates global hypokinesis. There is  moderate left ventricular hypertrophy. Left ventricular diastolic function  could not be evaluated.   3. Right ventricular systolic function is normal. The right ventricular  size is normal. There is mildly elevated pulmonary artery systolic  pressure. The estimated right ventricular systolic pressure is 76.5 mmHg.   4. Left atrial size was severely dilated.   5. Right atrial size was moderately dilated.   6. The mitral valve is abnormal. Trivial mitral valve regurgitation.  Moderate mitral annular calcification.   7. Tricuspid valve regurgitation is moderate.   8. The aortic valve is abnormal. Aortic valve regurgitation is trivial.   9. Aortic dilatation noted. There is borderline dilatation of the  ascending aorta, measuring 38 mm.  10. The inferior vena cava is normal in size with <50% respiratory  variability, suggesting right atrial pressure of 8 mmHg.   Comparison(s): Changes from prior study  are noted. 06/14/2018: LVEF  55-60%,.  EKG:  EKG is ordered today.  The ekg ordered today demonstrates SB 51bpm, PVCs, RBBB, LVH with repol abnormality, LAFB  Recent Labs: 07/28/2021: Hemoglobin 12.0; Platelets  249 11/10/2021: BUN 16; Creatinine, Ser 1.20; Potassium 4.3; Sodium 138  Recent Lipid Panel    Component Value Date/Time   CHOL 142 04/17/2013 0833   CHOL 192 10/29/2011 0429   TRIG 125 04/17/2013 0833   TRIG 150 10/29/2011 0429   HDL 41 04/17/2013 0833   HDL 32 (L) 10/29/2011 0429   CHOLHDL 3.5 04/17/2013 0833   VLDL 30 10/29/2011 0429   LDLCALC 76 04/17/2013 0833   LDLCALC 130 (H) 10/29/2011 0429     Physical Exam:    VS:  BP 130/70 (BP Location: Left Arm, Patient Position: Sitting, Cuff Size: Normal)   Pulse (!) 51   Ht '6\' 1"'$  (1.854 m)   Wt 212 lb (96.2 kg)   SpO2 98%   BMI 27.97 kg/m     Wt Readings from Last 3 Encounters:  05/12/22 212 lb (96.2 kg)  02/08/22 213 lb 3.2 oz (96.7 kg)  11/04/21 212 lb (96.2 kg)     GEN: Well nourished, well developed in no acute distress HEENT: Normal NECK: No JVD; No carotid bruits LYMPHATICS: No lymphadenopathy CARDIAC: bradycardia, RR, no murmurs, rubs, gallops RESPIRATORY:  Clear to auscultation without rales, wheezing or rhonchi  ABDOMEN: Soft, non-tender, non-distended MUSCULOSKELETAL:  No edema; No deformity  SKIN: Warm and dry NEUROLOGIC:  Alert and oriented x 3 PSYCHIATRIC:  Normal affect   ASSESSMENT:    1. Cardiomyopathy, unspecified type ([place])   2. Ventricular septal defect   3. Persistent atrial fibrillation ([place])   4. 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 Repeat echo showed LVEF 55-60%. The patient is euvolemic on exam. Continue Losartan and Toprol. He did not tolerate Farxiga or spironolactone.   VSD s/p repair Prior heart cath 07/2021 showed no shunting.  Per Dr. Saunders Revel, no further work-up planned.   CAD Patient denies anginal symptoms. LHC 07/2021 showed non critical CAD (Report above). Continue Imdur, Toprol and Lipitor.   Persistent Afib EKG shows SB with heart rate of 51bpm, does not appear to be junctional. Also with PVCs. He is on amiodarone '200mg'$  daily and Toprol '100mg'$  daily. Prior note from Dr. Saunders Revel said he was on amiodarone '100mg'$  daily. I will decrease Toprol to '75mg'$  daily. I will refer to EP for medication recommendations. Continue Eliquis '5mg'$  BID for stroke ppx.   Disposition: Follow up in 4-6 month(s) with MD/APP    Signed, Abbi Mancini Ninfa Meeker, PA-C  05/12/2022 4:24 PM    [facility]

## 2022-05-31 ENCOUNTER — Emergency Department
Admission: EM | Admit: 2022-05-31 | Discharge: 2022-05-31 | Disposition: A | Discharge status: Home / Self Care | Payer: Medicare Other | Attending: Student in an Organized Health Care Education/Training Program | Admitting: Student in an Organized Health Care Education/Training Program

## 2022-05-31 ENCOUNTER — Encounter: Payer: Self-pay | Admitting: *Deleted

## 2022-05-31 ENCOUNTER — Emergency Department: Payer: Medicare Other

## 2022-05-31 ENCOUNTER — Other Ambulatory Visit: Payer: Self-pay

## 2022-05-31 DIAGNOSIS — R0602 Shortness of breath: Secondary | ICD-10-CM | POA: Insufficient documentation

## 2022-05-31 DIAGNOSIS — Z79899 Other long term (current) drug therapy: Secondary | ICD-10-CM | POA: Insufficient documentation

## 2022-05-31 DIAGNOSIS — Z951 Presence of aortocoronary bypass graft: Secondary | ICD-10-CM | POA: Insufficient documentation

## 2022-05-31 DIAGNOSIS — R0789 Other chest pain: Secondary | ICD-10-CM | POA: Insufficient documentation

## 2022-05-31 DIAGNOSIS — I4891 Unspecified atrial fibrillation: Secondary | ICD-10-CM | POA: Insufficient documentation

## 2022-05-31 DIAGNOSIS — I5023 Acute on chronic systolic (congestive) heart failure: Secondary | ICD-10-CM | POA: Diagnosis not present

## 2022-05-31 DIAGNOSIS — I2510 Atherosclerotic heart disease of native coronary artery without angina pectoris: Secondary | ICD-10-CM | POA: Diagnosis not present

## 2022-05-31 DIAGNOSIS — Z1152 Encounter for screening for COVID-19: Secondary | ICD-10-CM | POA: Insufficient documentation

## 2022-05-31 DIAGNOSIS — Z8679 Personal history of other diseases of the circulatory system: Secondary | ICD-10-CM | POA: Diagnosis not present

## 2022-05-31 DIAGNOSIS — I509 Heart failure, unspecified: Secondary | ICD-10-CM

## 2022-05-31 LAB — BASIC METABOLIC PANEL
Anion gap: 7 (ref 5–15)
BUN: 20 mg/dL (ref 8–23)
CO2: 28 mmol/L (ref 22–32)
Calcium: 9 mg/dL (ref 8.9–10.3)
Chloride: 105 mmol/L (ref 98–111)
Creatinine, Ser: 1.23 mg/dL (ref 0.61–1.24)
GFR, Estimated: >60 mL/min (ref >60)
Glucose, Bld: 102 mg/dL — ABNORMAL HIGH (ref 70–99)
Potassium: 4.1 mmol/L (ref 3.5–5.1)
Sodium: 140 mmol/L (ref 135–145)

## 2022-05-31 LAB — TROPONIN I (HIGH SENSITIVITY)
Troponin I (High Sensitivity): 17 ng/L (ref <18)
Troponin I (High Sensitivity): 19 ng/L — ABNORMAL HIGH (ref <18)

## 2022-05-31 LAB — CBC
HCT: 37.3 % — ABNORMAL LOW (ref 39.0–52.0)
Hemoglobin: 11.8 g/dL — ABNORMAL LOW (ref 13.0–17.0)
MCH: 29.3 pg (ref 26.0–34.0)
MCHC: 31.6 g/dL (ref 30.0–36.0)
MCV: 92.6 fL (ref 80.0–100.0)
Platelets: 208 10*3/uL (ref 150–400)
RBC: 4.03 MIL/uL — ABNORMAL LOW (ref 4.22–5.81)
RDW: 13.2 % (ref 11.5–15.5)
WBC: 9 10*3/uL (ref 4.0–10.5)
nRBC: 0 % (ref 0.0–0.2)

## 2022-05-31 LAB — RESP PANEL BY RT-PCR (RSV, FLU A&B, COVID)  RVPGX2
Influenza A by PCR: NEGATIVE
Influenza B by PCR: NEGATIVE
Resp Syncytial Virus by PCR: NEGATIVE
SARS Coronavirus 2 by RT PCR: NEGATIVE

## 2022-05-31 LAB — BRAIN NATRIURETIC PEPTIDE: B Natriuretic Peptide: 247.1 pg/mL — ABNORMAL HIGH (ref 0.0–100.0)

## 2022-05-31 MED ORDER — FUROSEMIDE 10 MG/ML IJ SOLN
40.0000 mg | Freq: Once | INTRAMUSCULAR | Status: AC
  Administered 2022-05-31: 40 mg via INTRAVENOUS
  Filled 2022-05-31: qty 4

## 2022-05-31 NOTE — ED Provider Notes (Signed)
[facility] Provider Note    Event Date/Time   First MD Initiated Contact with Patient 05/31/22 1921     (approximate)   History   Chest Pain   HPI  Aaron Jones is a 76 y.o. male extensive history of CAD as well as thoracic aneurysm, bronchitis, A-fib presents to the ER for evaluation of chest pain and discomfort starting this AM.  Was having some shortness of breath and orthopnea.  Has been compliant with his medications.  Went to a [employer] loss adjust[employer] department to be checked and was encouraged to come to the ER due to his complaints and history.  He denies any chest pain or pressure at this time.  No pleurisy.  No fevers or chills.  Has been compliant with his medications.     Physical Exam   Triage Vital Signs: ED Triage Vitals [05/31/22 1900]  Enc Vitals Group     BP      Pulse      Resp      Temp      Temp src      SpO2      Weight 211 lb 10.3 oz (96 kg)     Height '6\' 1"'$  (1.854 m)     Head Circumference      Peak Flow      Pain Score 3     Pain Loc      Pain Edu?      Excl. in [place]?     Most recent vital signs: Vitals:   05/31/22 2218 05/31/22 2252  BP:  139/75  Pulse:  91  Resp:  19  Temp:    SpO2: (S) 97% 100%     Constitutional: Alert  Eyes: Conjunctivae are normal.  Head: Atraumatic. Nose: No congestion/rhinnorhea. Mouth/Throat: Mucous membranes are moist.   Neck: Painless ROM.  Cardiovascular:   Good peripheral circulation. Respiratory: Normal respiratory effort.  No retractions.  Gastrointestinal: Soft and nontender.  Musculoskeletal:  no deformity  1+ LE edema Neurologic:  MAE spontaneously. No gross focal neurologic deficits are appreciated.  Skin:  Skin is warm, dry and intact. No rash noted. Psychiatric: Mood and affect are normal. Speech and behavior are normal.    ED Results / Procedures / Treatments   Labs (all labs ordered are listed, but only abnormal results are displayed) Labs Reviewed  BASIC METABOLIC  PANEL - Abnormal; Notable for the following components:      Result Value   Glucose, Bld 102 (*)    All other components within normal limits  CBC - Abnormal; Notable for the following components:   RBC 4.03 (*)    Hemoglobin 11.8 (*)    HCT 37.3 (*)    All other components within normal limits  BRAIN NATRIURETIC PEPTIDE - Abnormal; Notable for the following components:   B Natriuretic Peptide 247.1 (*)    All other components within normal limits  TROPONIN I (HIGH SENSITIVITY) - Abnormal; Notable for the following components:   Troponin I (High Sensitivity) 19 (*)    All other components within normal limits  RESP PANEL BY RT-PCR (RSV, FLU A&B, COVID)  [id number]  TROPONIN I (HIGH SENSITIVITY)     EKG  ED ECG REPORT I, Merlyn Lot, the attending physician, personally viewed and interpreted this ECG.   Date: 05/31/2022  EKG Time: 19:03  Rate: 110  Rhythm: afib?  Axis: left  Intervals: bifascicular block  ST&T Change: nonspecific st abn,no stemi, nonspcific twave abn  RADIOLOGY Please see ED Course for my review and interpretation.  I personally reviewed all radiographic images ordered to evaluate for the above acute complaints and reviewed radiology reports and findings.  These findings were personally discussed with the patient.  Please see medical record for radiology report.    PROCEDURES:  Critical Care performed: No  Procedures   MEDICATIONS ORDERED IN ED: Medications  furosemide (LASIX) injection 40 mg (40 mg Intravenous Given 05/31/22 2007)     IMPRESSION / MDM / ASSESSMENT AND PLAN / ED COURSE  I reviewed the triage vital signs and the nursing notes.                              Differential diagnosis includes, but is not limited to, Asthma, copd, CHF, pna, ptx, malignancy, Pe, anemia  Patient presenting to the ER for evaluation of symptoms as described above.  Based on symptoms, risk factors and considered above differential, this presenting  complaint could reflect a potentially life-threatening illness therefore the patient will be placed on continuous pulse oximetry and telemetry for monitoring.  Laboratory evaluation will be sent to evaluate for the above complaints.  Patient is nontoxic-appearing currently pain-free.  EKG is somewhat concerning but he denies any chest pain will observe for serial enzymes.  Does not seem consistent with PE he is on Eliquis.  Possible CHF as he does have some lower extremity swelling does have some crackles on exam we will give Lasix.   Clinical Course as of 05/31/22 2301  Wed May 31, 2022  1946 Patient reassessed remains pain-free at this time.  Troponin initially negative.  Chest x-ray on my review and interpretation without evidence of pneumothorax or consolidation. [PR]  2138 Patient reassessed.  Feels significantly improved after Lasix.  Is already diuresing quite a bit.  Will continue observe.  Remains pain-free. [PR]  2251 Patient reassessed.  Well-appearing able to ambulate steady gait without any hypoxia.  Given his significant proved after diuresis lack of pain I think that he is appropriate for outpatient follow-up.  Patient agreeable with plan. [PR]    Clinical Course User Index [PR] Merlyn Lot, MD     FINAL CLINICAL IMPRESSION(S) / ED DIAGNOSES   Final diagnoses:  Atypical chest pain  Acute on chronic congestive heart failure, unspecified heart failure type [facility])     Rx / DC Orders   ED Discharge Orders          Ordered    Ambulatory referral to Cardiology       Comments: If you have not heard from the Cardiology office within the next 72 hours please call [phone].   05/31/22 2233             Note:  This document was prepared using Dragon voice recognition software and may include unintentional dictation errors.    Merlyn Lot, MD 05/31/22 [phone]

## 2022-05-31 NOTE — ED Triage Notes (Addendum)
Pt brought in via ems for chest pain   sx began today.  No n/v/d  pt reports urinary frequency.  Pt alert  speech clear. Iv in place. Pt reports pain in center of chest.  No sob.  Skin warm and dry.

## 2022-05-31 NOTE — ED Triage Notes (Signed)
First Nurse Note:   Arrives via [facility].  C/O chest pain since lunch time and SOB.  Hx CABG.  2 sl NTG with reduction of pain.  324 ASA given.  Initial bp: 180/90, 130/80 after 2 NTG.  P:  105.  CBG"  103.  98 RA.  18g LAC

## 2022-06-07 ENCOUNTER — Ambulatory Visit: Payer: Medicare Other | Admitting: Cardiology

## 2022-06-19 NOTE — Progress Notes (Unsigned)
Cardiology Office Note    Date:  06/20/2022   ID:  Aaron Jones, DOB [birth year], MRN [id number]  PCP:  Kathlyn Sacramento, MD  Cardiologist:  Nelva Bush, MD  Electrophysiologist:  Cristopher Peru, MD   Chief Complaint: ED follow-up  History of Present Illness:   Aaron Jones is a 77 y.o. male with history of CAD status post single-vessel CABG with SVG to PDA, VSD status post open repair with Dacron patch in 2014, persistent A-fib status post Maze and left atrial appendage clipping during CABG, PAT, thoracic aortic aneurysm, HTN, HLD, DM2, and OSA not on CPAP who presents for ED follow-up as outlined below.  He was previously followed by EP with recommendation to establish with general cardiology in 07/2021 due to increasing shortness of breath and fatigue similar to what he experienced leading up to his CABG and VSD repair in 2014.  CTA of the chest/aorta in 2021 showed unchanged enlargement of the sinuses of Valsalva measuring up to 4.3 cm, normal caliber ascending thoracic aorta measuring up to 3.5 x 3.5 cm, unchanged enlargement of the proximal descending thoracic aorta measuring up to 3.1 x 3.0 cm.  Echo from 12/2020 demonstrated an EF of 45 to 50%, global hypokinesis, moderate LVH, prior dacryon patch VSD repair compromised with left to right shunting noted at the membranous septum with a peak gradient of 122 mmHg, normal RV systolic function and ventricular cavity size, mildly elevated PASP estimated at 39.8 mmHg, severely dilated left atrium, moderately dilated right atrium, trivial mitral regurgitation with moderate mitral annular calcification, moderate tricuspid regurgitation, trivial aortic insufficiency, borderline dilatation of the ascending aorta measuring 38 mm, and a rhythm that appeared to be atrial flutter.  Zio patch from 04/2021 showed a predominant rhythm of sinus with no evidence of A-fib with an average rate of 77 bpm, 47 episodes of SVT with the longest interval  lasting 19 beats, occasional PVCs representing 3.7%, and a PAC burden of 4.1%.  R/LHC from 07/2021 showed moderate to severe, but noncritical disease involving the ostial D1, ramus intermedius, and RPDA with widely patent SVG to RPDA.  Mildly elevated left heart filling pressures with prominent V waves noted on PCWP tracing, severely elevated right heart filling pressures, moderate pulmonary hypertension, low normal to mildly reduced cardiac output/index, and no significant intracardiac shunting.  Echo in 11/2021 demonstrated an EF of 55 to 60%, no regional wall motion abnormalities, moderate concentric LVH, status post dacryon patch VSD repair with moderately sized membranous VSD with left-to-right shunting with a peak gradient of 137 mmHg, low normal RV systolic function, normal ventricular cavity size, moderately increased RV wall thickness, mildly elevated PASP estimated at 36.8 mmHg, severely dilated left atrium, mild aortic insufficiency, moderate aortic stenosis with a mean gradient of 19 mmHg and a valve area by VTI of 1.18 cm, and an estimated right atrial pressure of 3 mmHg.  Most recently, he has been followed by Cadence Kathlen Mody, PA-C over the past several months, last seeing her in 05/2022.  At that time, he reported daytime somnolence as he was staying up at night to watch his wife with MS, and was sleeping during the day.  Historically he has been intolerant to CPAP mask with known OSA.  He was seen in the ED on 05/31/2022 with chest pain, dyspnea, and orthopnea.  Initial high-sensitivity troponin 17 with a delta of troponin 19.  BNP 247.  Chest x-ray showed cardiomegaly with pulmonary vascular congestion.  He was given IV Lasix  with symptomatic improvement.  He comes in today accompanied by his sister-in-law and is without symptoms of angina or decompensation.  No dyspnea, palpitations, dizziness, presyncope, or syncope.  He feels like he is back to his baseline.  No falls or symptoms  concerning for bleeding.  He does continue to note baseline fatigue which has been attributed to poor sleep hygiene as he is the primary caretaker for his wife who requires 24/7 care.  In this setting, he does not get much sleep.  He sometimes forgets to take his medications as well.  No acute concerns at today's visit.   Labs independently reviewed: 05/2022 - Hgb 11.8, PLT 208, potassium 4.1, BUN 20, serum creatinine 1.23 03/2022 - albumin 3.5, AST/ALT normal, A1c 6.6, TC 109, TG 79, HDL 32, LDL 61 12/2020 - TSH normal  Past Medical History:  Diagnosis Date   Atrial fibrillation with rapid ventricular response ([place]) 01/16/2013   a. 01/2013 s/p R & L Maze and LAA clipping in setting of VSD repair;  b. Prev on amiodarone;  c. CHA2DS2VASc = 4-->Apixaban;  c. 01/2013 Recurrent afib/flutter requiring DCCV.   CHF (congestive heart failure) (HCC)    Colon polyp    COPD (chronic obstructive pulmonary disease) ([place])    Coronary artery disease    a. 01/2013 s/p CABG x 1 (VG->PDA) in setting of VSD repair.   GERD (gastroesophageal reflux disease)    Hyperlipidemia    Hypertension    PCP- [place], phone; [phone]   Mitral valve disorder    Obesity    PAT (paroxysmal atrial tachycardia)    a. 02/2015 s/p DCCV;  b. 12/2016 recurrent PAT - unchanged with escalating BB dose (rates 106-107).   PSVT (paroxysmal supraventricular tachycardia)    a. 10/2011 Admitted to [facility] w/ SVT-->broke with IV dilt.   Recurrent Nephrolithiasis    Schatzki's ring    Sleep apnea    [facility]- in process of being evaluated now, states 12 yrs. ago was on CPAP but after having his deviated septum repaired, he had put the CPAP in storage. Pt. will get a new machine soon.    Thoracic aortic aneurysm ([place])    a. 01/2016 CTA chest: 4.0 cm Asc Ao; b. 01/2017 Echo: nl Ao root size.   Type II diabetes mellitus ([place])    Umbilical hernia    "not repaired" (01/16/2013)   VSD (ventricular septal defect) w/ persistent systolic  murmur    a. 11/4678 s/p VSD closure (dacron patch);  b. 01/2017 Echo: EF 50-55%, no rwma, mild AS, mild MR, mod dil LA, dilated RV, sev dil RA, mild TR. PASP nl.    Past Surgical History:  Procedure Laterality Date   CARDIAC CATHETERIZATION  > 5 yr ago   82 at [facility], reportedly clean   CARDIAC CATHETERIZATION  11/03/2012   CARDIOVERSION N/A 02/12/2015   Procedure: CARDIOVERSION;  Surgeon: Thayer Headings, MD;  Location: [place];  Service: Cardiovascular;  Laterality: N/A;   CARDIOVERSION N/A 04/27/2017   Procedure: CARDIOVERSION;  Surgeon: Evans Lance, MD;  Location: [place] CV LAB;  Service: Cardiovascular;  Laterality: N/A;   CLIPPING OF ATRIAL APPENDAGE N/A 01/07/2013   Procedure: CLIPPING OF ATRIAL APPENDAGE;  Surgeon: Grace Isaac, MD;  Location: [place];  Service: Open Heart Surgery;  Laterality: N/A;   COLONOSCOPY W/ POLYPECTOMY     COLONOSCOPY WITH PROPOFOL N/A 01/05/2021   Procedure: COLONOSCOPY WITH PROPOFOL;  Surgeon: Toledo, Benay Pike, MD;  Location: [facility] ENDOSCOPY;  Service: Gastroenterology;  Laterality: N/A;   CORONARY ARTERY BYPASS GRAFT N/A 01/07/2013   Procedure: CORONARY ARTERY BYPASS GRAFTING (CABG);  Surgeon: Grace Isaac, MD;  Location: [place];  Service: Open Heart Surgery;  Laterality: N/A;  Times1 using endoscopically harvested saphenous vein graft to the PDA   ESOPHAGOGASTRODUODENOSCOPY N/A 01/05/2021   Procedure: ESOPHAGOGASTRODUODENOSCOPY (EGD);  Surgeon: Toledo, Benay Pike, MD;  Location: [facility] ENDOSCOPY;  Service: Gastroenterology;  Laterality: N/A;  DM   EYE SURGERY Left 02/03/1989   "clipped muscle so it wouldn't be pulling up" (01/16/2013)   INTRAOPERATIVE TRANSESOPHAGEAL ECHOCARDIOGRAM N/A 01/07/2013   Procedure: INTRAOPERATIVE TRANSESOPHAGEAL ECHOCARDIOGRAM;  Surgeon: Grace Isaac, MD;  Location: [place];  Service: Open Heart Surgery;  Laterality: N/A;   LEFT AND RIGHT HEART CATHETERIZATION WITH CORONARY ANGIOGRAM N/A 11/20/2012    Procedure: LEFT AND RIGHT HEART CATHETERIZATION WITH CORONARY ANGIOGRAM;  Surgeon: Jolaine Artist, MD;  Location: [facility] CATH LAB;  Service: Cardiovascular;  Laterality: N/A;   MAZE N/A 01/07/2013   Procedure: MAZE;  Surgeon: Grace Isaac, MD;  Location: [place];  Service: Open Heart Surgery;  Laterality: N/A;   MULTIPLE EXTRACTIONS WITH ALVEOLOPLASTY N/A 12/11/2012   Procedure: Extraction of tooth #'s 1,9,1,4,78,29,56,21,30,86,57,84,69,62,95 wioth alveoloplasty and bialteral fibrous tuberosity reductions.;  Surgeon: Lenn Cal, DDS;  Location: [facility];  Service: Oral Surgery;  Laterality: N/A;   NASAL SEPTUM SURGERY  06/05/2002   RIGHT/LEFT HEART CATH AND CORONARY ANGIOGRAPHY N/A 08/02/2021   Procedure: RIGHT/LEFT HEART CATH AND CORONARY ANGIOGRAPHY;  Surgeon: Nelva Bush, MD;  Location: [place] CV LAB;  Service: Cardiovascular;  Laterality: N/A;   sepfal deveation repair     TEE WITHOUT CARDIOVERSION N/A 11/21/2012   Procedure: TRANSESOPHAGEAL ECHOCARDIOGRAM (TEE);  Surgeon: Jolaine Artist, MD;  Location: [facility] ENDOSCOPY;  Service: Cardiovascular;  Laterality: N/A;   VSD REPAIR N/A 01/07/2013   Procedure: VENTRICULAR SEPTAL DEFECT (VSD) REPAIR;  Surgeon: Grace Isaac, MD;  Location: [place];  Service: Open Heart Surgery;  Laterality: N/A;   VSD REPAIR      Current Medications: Current Meds  Medication Sig   acetaminophen (TYLENOL) 500 MG tablet Take 1,000 mg by mouth daily as needed for moderate pain or headache.   amiodarone (PACERONE) 200 MG tablet Take 1 tablet (200 mg total) by mouth daily.   apixaban (ELIQUIS) 5 MG TABS tablet Take 1 tablet (5 mg total) by mouth 2 (two) times daily.   atorvastatin (LIPITOR) 20 MG tablet Take 1 tablet (20 mg total) by mouth at bedtime.   furosemide (LASIX) 40 MG tablet Take 1 tablet (40 mg total) by mouth daily.   glipiZIDE (GLUCOTROL) 10 MG tablet Take 5-10 mg by mouth See admin instructions. '10mg'$  in the am and '5mg'$  in the pm    Glucose Blood (BLOOD GLUCOSE TEST STRIPS) STRP 1 strip by In Vitro route 2 (two) times daily.   isosorbide mononitrate (IMDUR) 30 MG 24 hr tablet Take 0.5 tablets (15 mg total) by mouth daily.   losartan (COZAAR) 50 MG tablet Take 1 tablet (50 mg total) by mouth daily.   metFORMIN (GLUCOPHAGE) 1000 MG tablet Take 1,000 mg by mouth 2 (two) times daily.   metoprolol succinate (TOPROL-XL) 50 MG 24 hr tablet Take 1.5 tablets (75 mg) by mouth once daily. Take with or immediately following a meal. (Patient taking differently: 100 mg daily. Take 1.5 tablets (75 mg) by mouth once daily. Take with or immediately following a meal.)   omeprazole (PRILOSEC) 40 MG capsule  Take 40 mg by mouth daily before breakfast.    pioglitazone (ACTOS) 30 MG tablet Take 30 mg by mouth daily.   sertraline (ZOLOFT) 50 MG tablet Take 50 mg by mouth daily.   traMADol (ULTRAM) 50 MG tablet Take One tab PO Q6 hours PRN pain   [DISCONTINUED] losartan (COZAAR) 25 MG tablet Take 1 tablet (25 mg total) by mouth daily.    Allergies:   Biaxin [clarithromycin]   Social History   Socioeconomic History   Marital status: Married    Spouse name: Not on file   Number of children: Not on file   Years of education: Not on file   Highest education level: Not on file  Occupational History   Occupation: Manufacturing    Comment: Heavy work at times  Tobacco Use   Smoking status: Never    Passive exposure: Never   Smokeless tobacco: Never  Vaping Use   Vaping Use: Never used  Substance and Sexual Activity   Alcohol use: No   Drug use: No   Sexual activity: Not Currently  Other Topics Concern   Not on file  Social History Narrative   Still works full time. Works around the yard. Lives with wife. Has 2 sisters, neither with cardiac issues.   Social Determinants of Health   Financial Resource Strain: Not on file  Food Insecurity: Not on file  Transportation Needs: Not on file  Physical Activity: Not on file  Stress: Not on  file  Social Connections: Not on file     Family History:  The patient's family history includes Cancer in his mother; Diabetes in his mother.  ROS:   12-point review of systems is negative unless otherwise noted in HPI   EKGs/Labs/Other Studies Reviewed:    Studies reviewed were summarized above. The additional studies were reviewed today:  Limited echo 11/25/2021: 1. Left ventricular ejection fraction, by estimation, is 55 to 60%. The  left ventricle has normal function. The left ventricle has no regional  wall motion abnormalities. There is moderate concentric left ventricular  hypertrophy. The average left  ventricular global longitudinal strain is -14.3 %.   2. s/p Dacron Patch VSD repair. There is a moderately sized membranous  ventricular septal defect with left to right shunting, peak gradient 137  mm Hg.   3. Right ventricular systolic function is low normal. The right  ventricular size is normal. Moderately increased right ventricular wall  thickness. There is mildly elevated pulmonary artery systolic pressure.  The estimated right ventricular systolic  pressure is 00.7 mmHg.   4. Left atrial size was severely dilated.   5. The mitral valve is normal in structure. No evidence of mitral valve  regurgitation. No evidence of mitral stenosis.   6. The aortic valve is normal in structure. Aortic valve regurgitation is  mild. Moderate aortic valve stenosis. Aortic valve area, by VTI measures  1.18 cm. Aortic valve mean gradient measures 19.0 mmHg. Aortic valve Vmax  measures 2.89 m/s.   7. The inferior vena cava is normal in size with greater than 50%  respiratory variability, suggesting right atrial pressure of 3 mmHg.  __________  [facility] 08/02/2021: Conclusions: Moderate-severe but non-critical disease involving ostial D1, ramus intermedius, and rPDA. Widely patent SVG-rPDA. Mildly elevated left heart filling pressures with prominent V-waves noted on PCWP tracing (PCWP  26 mmHg; LVEDP 20-25 mmHg). Severely elevated right heart filling pressures (RAP/RVEDP 16 mmHg). Moderate pulmonary hypertension (mean PAP 40 mmHg; PVR 2.8 WU). Low normal to mildly  reduced Fick cardiac output/index (CO 4.9 L/min, CI 2.3 L/min/m). No significant intracardiac shunting (QPS 1.0-1.2).   Recommendations: Escalate diuresis: We will increase furosemide to 40 mg twice daily. Add isosorbide mononitrate 15 mg daily for antianginal therapy. Increased goal-directed medical therapy for nonischemic cardiomyopathy as an outpatient, as tolerated. Continue clinical and echocardiographic monitoring of VSD; shunting does not appear to be significant at this time. Consider referral to advanced heart failure clinic if symptoms persist. Restart apixaban tomorrow morning if no evidence of bleeding/vascular injury. __________  Elwyn Reach patch 04/2021: HR 52 - 231, average 77 bpm. No atrial fibrillation detected. 47 SVT, longest lasting 19 beats. Occasional ventricular ectopy, 3.7%. Rare supraventricular ectopy, 4.1%. No sustained arrhythmias. __________  2D echo 12/28/2020: 1. Rhythm appears to be atrial flutter - no A waves noted.   2. Prior dacron patch VSD repair is compromised with left to right  shunting noted at the membranous septum (images 35, 45) - peak gradient is  160 mmHg (systolic BP). The neck of the fenestration measures about 0.5  cm. Left ventricular ejection fraction,   by estimation, is 45 to 50%. The left ventricle has mildly decreased  function. The left ventricle demonstrates global hypokinesis. There is  moderate left ventricular hypertrophy. Left ventricular diastolic function  could not be evaluated.   3. Right ventricular systolic function is normal. The right ventricular  size is normal. There is mildly elevated pulmonary artery systolic  pressure. The estimated right ventricular systolic pressure is 73.7 mmHg.   4. Left atrial size was severely dilated.   5.  Right atrial size was moderately dilated.   6. The mitral valve is abnormal. Trivial mitral valve regurgitation.  Moderate mitral annular calcification.   7. Tricuspid valve regurgitation is moderate.   8. The aortic valve is abnormal. Aortic valve regurgitation is trivial.   9. Aortic dilatation noted. There is borderline dilatation of the  ascending aorta, measuring 38 mm.  10. The inferior vena cava is normal in size with <50% respiratory  variability, suggesting right atrial pressure of 8 mmHg.   Comparison(s): Changes from prior study are noted. 06/14/2018: LVEF  55-60%,.  __________  2D echo 06/14/2018: - Left ventricle: The cavity size was normal. There was moderate    concentric hypertrophy. Systolic function was normal. The    estimated ejection fraction was in the range of 55% to 60%. Wall    motion was normal; there were no regional wall motion    abnormalities. Features are consistent with a pseudonormal left    ventricular filling pattern, with concomitant abnormal relaxation    and increased filling pressure (grade 2 diastolic dysfunction).  - Aortic valve: Valve mobility was restricted. There was mild    stenosis. There was moderate regurgitation. Peak velocity (S):    233 cm/s. Mean gradient (S): 13 mm Hg. Peak gradient (S): 22 mm    Hg.  - Mitral valve: Transvalvular velocity was within the normal range.    There was no evidence for stenosis. There was moderate    regurgitation.  - Left atrium: The atrium was severely dilated.  - Right ventricle: The cavity size was mildly dilated. Wall    thickness was normal. Systolic function was normal.  - Atrial septum: A patent foramen ovale cannot be excluded.  - Tricuspid valve: There was mild regurgitation.  - Pulmonary arteries: Systolic pressure was mildly increased. PA    peak pressure: 41 mm Hg (S).  __________  See CV Studies in Epic for more  remote imaging   EKG:  EKG is ordered today.  The EKG ordered today  demonstrates NSR, 60 bpm, bifascicular block, consistent with prior tracings  Recent Labs: 05/31/2022: B Natriuretic Peptide 247.1; BUN 20; Creatinine, Ser 1.23; Hemoglobin 11.8; Platelets 208; Potassium 4.1; Sodium 140  Recent Lipid Panel    Component Value Date/Time   CHOL 142 04/17/2013 0833   CHOL 192 10/29/2011 0429   TRIG 125 04/17/2013 0833   TRIG 150 10/29/2011 0429   HDL 41 04/17/2013 0833   HDL 32 (L) 10/29/2011 0429   CHOLHDL 3.5 04/17/2013 0833   VLDL 30 10/29/2011 0429   LDLCALC 76 04/17/2013 0833   LDLCALC 130 (H) 10/29/2011 0429    PHYSICAL EXAM:    VS:  BP (!) 152/70 (BP Location: Left Arm, Patient Position: Sitting, Cuff Size: Normal)   Pulse 60   Ht 6' (1.829 m)   Wt 214 lb (97.1 kg)   SpO2 99%   BMI 29.02 kg/m   BMI: Body mass index is 29.02 kg/m.  Physical Exam Vitals reviewed.  Constitutional:      Appearance: He is well-developed.  HENT:     Head: Normocephalic and atraumatic.  Eyes:     General:        Right eye: No discharge.        Left eye: No discharge.  Neck:     Vascular: No JVD.  Cardiovascular:     Rate and Rhythm: Normal rate and regular rhythm.     Pulses:          Posterior tibial pulses are 2+ on the right side and 2+ on the left side.     Heart sounds: S1 normal and S2 normal. Heart sounds not distant. No midsystolic click and no opening snap. Murmur heard.     Holosystolic murmur is present with a grade of 2/6.     No friction rub.  Pulmonary:     Effort: Pulmonary effort is normal. No respiratory distress.     Breath sounds: Normal breath sounds. No decreased breath sounds, wheezing or rales.  Chest:     Chest wall: No tenderness.  Abdominal:     General: There is no distension.     Palpations: Abdomen is soft.     Tenderness: There is no abdominal tenderness.  Musculoskeletal:     Cervical back: Normal range of motion.     Right lower leg: No edema.     Left lower leg: No edema.  Skin:    General: Skin is warm and  dry.     Nails: There is no clubbing.  Neurological:     Mental Status: He is alert and oriented to person, place, and time.  Psychiatric:        Speech: Speech normal.        Behavior: Behavior normal.        Thought Content: Thought content normal.        Judgment: Judgment normal.     Wt Readings from Last 3 Encounters:  06/20/22 214 lb (97.1 kg)  05/31/22 211 lb 10.3 oz (96 kg)  05/12/22 212 lb (96.2 kg)     ASSESSMENT & PLAN:   CAD status post CABG without angina: He is doing well and is without symptoms concerning for angina or decompensation.  He is on apixaban in place of aspirin, given underlying persistent A-fib, to minimize bleeding risk.  Continue aggressive risk factor modification and secondary prevention including atorvastatin, isosorbide mononitrate, and metoprolol  succinate.  No indication for further ischemic testing at this time.  HFimpEF: Most recent echo from 11/2021 showed improvement in LV systolic function with an EF of 55-60%.  Continue current GDMT with titration of losartan to 50 mg daily and continuation of metoprolol succinate.  Defer further escalation of GDMT at this time given asymptomatic presentation and in the setting of normalization of LV systolic function.  Should he have recurrence of symptoms, would look to transition ARB to ARNI along with potential addition of MRA and SGLT2 inhibitor.  VSD status post Dacron repair: Previously noted to have dacryon graft compromise with a left to right shunt a peak gradient of 122 mmHg with normal RV cavity size by echo in 12/2020.  R/LHC with shunt run in 07/2021 showed VSD shunting did not appear to be significant at that time.  Most recent echo from 11/2021 again a moderately sized membranous VSD with left-to-right shunting with a peak gradient of 137 mmHg.  Schedule cardiac MRI for measurement of Qp/Qs.  Refer to adult congenital heart defect clinic at [facility] to establish care and for ongoing management.  Patient does  not have any teeth and no longer sees DDS.  Therefore, we will defer prescribing SBE prophylaxis (residual defect noted at the site of prosthetic patch).    Persistent Afib: Maintaining sinus rhythm.  He remains on metoprolol succinate.  CHA2DS2-VASc 5 (CHF, HTN, age x 1, DM, vascular disease).  He remains on apixaban 5 mg twice daily and does not meet reduced dosing criteria.  Recent labs stable.  No symptoms concerning for bleeding.  Thoracic aortic aneurysm: Optimal blood pressure control is recommended with changes as outlined below.  Schedule CTA of the chest/aorta.  HTN: Blood pressure is mildly elevated in the office today.  Titrate losartan to 50 mg daily with continuation of.  HLD: LDL 61 in 03/2022, with normal AST/ALT at that time.  He remains on atorvastatin 20 mg.   Disposition: F/u with Dr. Saunders Revel or an APP in 2 months.   Medication Adjustments/Labs and Tests Ordered: Current medicines are reviewed at length with the patient today.  Concerns regarding medicines are outlined above. Medication changes, Labs and Tests ordered today are summarized above and listed in the Patient Instructions accessible in Encounters.   Signed, Christell Faith, PA-C 06/20/2022 3:37 PM     [facility] [address] [place] [address] [place], [place] [address] [phone]

## 2022-06-20 ENCOUNTER — Encounter: Payer: Self-pay | Admitting: Physician Assistant

## 2022-06-20 ENCOUNTER — Ambulatory Visit: Payer: Medicare Other | Attending: Cardiology | Admitting: Physician Assistant

## 2022-06-20 VITALS — BP 152/70 | HR 60 | Ht 72.0 in | Wt 214.0 lb

## 2022-06-20 DIAGNOSIS — I5022 Chronic systolic (congestive) heart failure: Secondary | ICD-10-CM | POA: Diagnosis not present

## 2022-06-20 DIAGNOSIS — I25118 Atherosclerotic heart disease of native coronary artery with other forms of angina pectoris: Secondary | ICD-10-CM

## 2022-06-20 DIAGNOSIS — I10 Essential (primary) hypertension: Secondary | ICD-10-CM

## 2022-06-20 DIAGNOSIS — I712 Thoracic aortic aneurysm, without rupture, unspecified: Secondary | ICD-10-CM

## 2022-06-20 DIAGNOSIS — Q210 Ventricular septal defect: Secondary | ICD-10-CM | POA: Diagnosis not present

## 2022-06-20 DIAGNOSIS — Z8774 Personal history of (corrected) congenital malformations of heart and circulatory system: Secondary | ICD-10-CM | POA: Diagnosis present

## 2022-06-20 DIAGNOSIS — Z951 Presence of aortocoronary bypass graft: Secondary | ICD-10-CM

## 2022-06-20 DIAGNOSIS — E785 Hyperlipidemia, unspecified: Secondary | ICD-10-CM

## 2022-06-20 DIAGNOSIS — I4819 Other persistent atrial fibrillation: Secondary | ICD-10-CM | POA: Diagnosis present

## 2022-06-20 MED ORDER — LOSARTAN POTASSIUM 50 MG PO TABS: 50.0000 mg | ORAL_TABLET | Freq: Every day | ORAL | 3 refills | Status: DC

## 2022-06-20 NOTE — Patient Instructions (Addendum)
Medication Instructions:  Your physician has recommended you make the following change in your medication:   INCREASE Losartan to 50 mg once daily   *If you need a refill on your cardiac medications before your next appointment, please call your pharmacy*   Lab Work: Labs will be needed before cardiac MRI. No appointment is needed just go to the following location:   [place] at [facility] 1st desk on the right to check in (REGISTRATION)  Lab hours: Monday- Friday (7:30 am- 5:30 pm)   If you have labs (blood work) drawn today and your tests are completely normal, you will receive your results only by: MyChart Message (if you have MyChart) OR A paper copy in the mail If you have any lab test that is abnormal or we need to change your treatment, we will call you to review the results.   Testing/Procedures:  CT of the aorta. Please call [phone] to get that scheduled.  Cardiac MRI. Instructions below:    You are scheduled for Cardiac MRI on ______________. Please arrive for your appointment at ______________ ( arrive 30-45 minutes prior to test start time). ?  [facility] [address] [place], [place] [address] [phone] Please take advantage of the free valet parking available at the MAIN entrance (A entrance).  Proceed to the [facility] Radiology Department (First Floor) for check-in.   [facility] [place], [place] [address] [phone] Please take advantage of the free valet parking available at the MAIN entrance. Proceed to [facility] registration for check-in (first floor).  Magnetic resonance imaging (MRI) is a painless test that produces images of the inside of the body without using Xrays.  During an MRI, strong magnets and radio waves work together in a Research officer, political party to form detailed images.   MRI images may provide more details about a medical condition than X-rays, CT scans, and  ultrasounds can provide.  You may be given earphones to listen for instructions.  You may eat a light breakfast and take medications as ordered with the exception of HCTZ (fluid pill, other). Please avoid stimulants for 12 hr prior to test. (Ie. Caffeine, nicotine, chocolate, or antihistamine medications)  If a contrast material will be used, an IV will be inserted into one of your veins. Contrast material will be injected into your IV. It will leave your body through your urine within a day. You may be told to drink plenty of fluids to help flush the contrast material out of your system.  You will be asked to remove all metal, including: Watch, jewelry, and other metal objects including hearing aids, hair pieces and dentures. Also wearable glucose monitoring systems (ie. Freestyle Libre and Omnipods) (Braces and fillings normally are not a problem.)   TEST WILL TAKE APPROXIMATELY 1 HOUR  PLEASE NOTIFY SCHEDULING AT LEAST 24 HOURS IN ADVANCE IF YOU ARE UNABLE TO KEEP YOUR APPOINTMENT. [phone]  Please call Marchia Bond, cardiac imaging nurse navigator with any questions/concerns. Marchia Bond RN Navigator Cardiac Imaging Gordy Clement RN Navigator Cardiac Imaging [facility] [phone] Office    Follow-Up: At [facility], you and your health needs are our priority.  As part of our continuing mission to provide you with exceptional heart care, we have created designated Provider Care Teams.  These Care Teams include your primary Cardiologist (physician) and Advanced Practice Providers (APPs -  Physician Assistants and Nurse Practitioners) who all work  together to provide you with the care you need, when you need it.  Your next appointment:   2 month(s)  Provider:   Nelva Bush, MD or Christell Faith, PA-C

## 2022-06-22 ENCOUNTER — Telehealth: Payer: Self-pay | Admitting: *Deleted

## 2022-06-22 NOTE — Telephone Encounter (Signed)
Faxed to [facility] requested documents for referral.

## 2022-06-22 NOTE — Telephone Encounter (Signed)
Called over to [facility] congential heart disease and spoke with Belenda Cruise. She requested that we fax office visit note and any testing along with demographics. Fax number provided was [phone] along with contact name if they need any additional information. No further needs.

## 2022-06-28 ENCOUNTER — Ambulatory Visit
Admission: RE | Admit: 2022-06-28 | Discharge: 2022-06-28 | Disposition: A | Discharge status: Home / Self Care | Payer: Medicare Other | Source: Ambulatory Visit | Attending: Physician Assistant | Admitting: Physician Assistant

## 2022-06-28 DIAGNOSIS — I712 Thoracic aortic aneurysm, without rupture, unspecified: Secondary | ICD-10-CM | POA: Diagnosis not present

## 2022-06-28 MED ORDER — IOHEXOL 350 MG/ML SOLN
75.0000 mL | Freq: Once | INTRAVENOUS | Status: AC | PRN
  Administered 2022-06-28: 75 mL via INTRAVENOUS

## 2022-07-04 ENCOUNTER — Telehealth: Payer: Self-pay | Admitting: *Deleted

## 2022-07-04 NOTE — Telephone Encounter (Signed)
Patient wast returning call. Please advise

## 2022-07-04 NOTE — Progress Notes (Unsigned)
Electrophysiology Office Follow up Visit Note:    Date:  07/05/2022   ID:  Aaron Jones, DOB 05-07-1946, MRN [id number]  PCP:  Kathlyn Sacramento, MD  [facility] Cardiologist:  Nelva Bush, MD  [facility] Electrophysiologist:  Cristopher Peru, MD    Interval History:    Aaron Jones is a 77 y.o. male who presents for a follow up visit. They were last seen in clinic 04/27/2021 for persistent AF and VSD. He was previously on eliquis. He saw Aaron Jones 06/20/2022. At that appointment he was maintaining sinus rhythm and was on eliquis.  He is with his sister-in-law today in clinic.  He tells me he has been doing well since the last appointment.  He has not noticed any problems with his heart rhythm.         Past Medical History:  Diagnosis Date   Atrial fibrillation with rapid ventricular response (Aaron Jones) 01/16/2013   a. 01/2013 s/p R & L Maze and LAA clipping in setting of VSD repair;  b. Prev on amiodarone;  c. CHA2DS2VASc = 4-->Apixaban;  c. 01/2013 Recurrent afib/flutter requiring DCCV.   CHF (congestive heart failure) (HCC)    Colon polyp    COPD (chronic obstructive pulmonary disease) ([place])    Coronary artery disease    a. 01/2013 s/p CABG x 1 (VG->PDA) in setting of VSD repair.   GERD (gastroesophageal reflux disease)    Hyperlipidemia    Hypertension    PCP- [place], phone; [phone]   Mitral valve disorder    Obesity    PAT (paroxysmal atrial tachycardia)    a. 02/2015 s/p DCCV;  b. 12/2016 recurrent PAT - unchanged with escalating BB dose (rates 106-107).   PSVT (paroxysmal supraventricular tachycardia)    a. 10/2011 Admitted to [facility] w/ SVT-->broke with IV dilt.   Recurrent Nephrolithiasis    Schatzki's ring    Sleep apnea    [facility]- in process of being evaluated now, states 12 yrs. ago was on CPAP but after having his deviated septum repaired, he had put the CPAP in storage. Pt. will get a new machine soon.    Thoracic aortic aneurysm ([place])    a.  01/2016 CTA chest: 4.0 cm Asc Ao; b. 01/2017 Echo: nl Ao root size.   Type II diabetes mellitus ([place])    Umbilical hernia    "not repaired" (01/16/2013)   VSD (ventricular septal defect) w/ persistent systolic murmur    a. 11/9483 s/p VSD closure (dacron patch);  b. 01/2017 Echo: EF 50-55%, no rwma, mild AS, mild MR, mod dil LA, dilated RV, sev dil RA, mild TR. PASP nl.    Past Surgical History:  Procedure Laterality Date   CARDIAC CATHETERIZATION  > 5 yr ago   48 at [facility], reportedly clean   CARDIAC CATHETERIZATION  11/03/2012   CARDIOVERSION N/A 02/12/2015   Procedure: CARDIOVERSION;  Surgeon: Aaron Headings, MD;  Location: [place];  Service: Cardiovascular;  Laterality: N/A;   CARDIOVERSION N/A 04/27/2017   Procedure: CARDIOVERSION;  Surgeon: Evans Lance, MD;  Location: [place] CV LAB;  Service: Cardiovascular;  Laterality: N/A;   CLIPPING OF ATRIAL APPENDAGE N/A 01/07/2013   Procedure: CLIPPING OF ATRIAL APPENDAGE;  Surgeon: Aaron Isaac, MD;  Location: [place];  Service: Open Heart Surgery;  Laterality: N/A;   COLONOSCOPY W/ POLYPECTOMY     COLONOSCOPY WITH PROPOFOL N/A 01/05/2021   Procedure: COLONOSCOPY WITH PROPOFOL;  Surgeon: Aaron Jones, Aaron Pike, MD;  Location:  [facility] ENDOSCOPY;  Service: Gastroenterology;  Laterality: N/A;   CORONARY ARTERY BYPASS GRAFT N/A 01/07/2013   Procedure: CORONARY ARTERY BYPASS GRAFTING (CABG);  Surgeon: Aaron Isaac, MD;  Location: [place];  Service: Open Heart Surgery;  Laterality: N/A;  Times1 using endoscopically harvested saphenous vein graft to the PDA   ESOPHAGOGASTRODUODENOSCOPY N/A 01/05/2021   Procedure: ESOPHAGOGASTRODUODENOSCOPY (EGD);  Surgeon: [place], Aaron Pike, MD;  Location: [facility] ENDOSCOPY;  Service: Gastroenterology;  Laterality: N/A;  DM   EYE SURGERY Left 02/03/1989   "clipped muscle so it wouldn't be pulling up" (01/16/2013)   INTRAOPERATIVE TRANSESOPHAGEAL ECHOCARDIOGRAM N/A 01/07/2013   Procedure: INTRAOPERATIVE  TRANSESOPHAGEAL ECHOCARDIOGRAM;  Surgeon: Aaron Isaac, MD;  Location: [place];  Service: Open Heart Surgery;  Laterality: N/A;   LEFT AND RIGHT HEART CATHETERIZATION WITH CORONARY ANGIOGRAM N/A 11/20/2012   Procedure: LEFT AND RIGHT HEART CATHETERIZATION WITH CORONARY ANGIOGRAM;  Surgeon: Aaron Artist, MD;  Location[facility] C[facility] LAB;  Service: Cardiovascular;  Laterality: N/A;   MAZE N/A 01/07/2013   Procedure: MAZE;  Surgeon: Aaron Isaac, MD;  Location: [place];  Service: Open Heart Surgery;  Laterality: N/A;   MULTIPLE EXTRACTIONS WITH ALVEOLOPLASTY N/A 12/11/2012   Procedure: Extraction of tooth #'s 0,9,9,8,33,82,50,53,97,67,34,19,37,90,24 wioth alveoloplasty and bialteral fibrous tuberosity reductions.;  Surgeon: Lenn Cal, DDS;  Location: [facility];  Service: Oral Surgery;  Laterality: N/A;   NASAL SEPTUM SURGERY  06/05/2002   RIGHT/LEFT HEART CATH AND CORONARY ANGIOGRAPHY N/A 08/02/2021   Procedure: RIGHT/LEFT HEART CATH AND CORONARY ANGIOGRAPHY;  Surgeon: Nelva Bush, MD;  Location: [place] CV LAB;  Service: Cardiovascular;  Laterality: N/A;   sepfal deveation repair     TEE WITHOUT CARDIOVERSION N/A 11/21/2012   Procedure: TRANSESOPHAGEAL ECHOCARDIOGRAM (TEE);  Surgeon: Aaron Artist, MD;  Location: [facility] ENDOSCOPY;  Service: Cardiovascular;  Laterality: N/A;   VSD REPAIR N/A 01/07/2013   Procedure: VENTRICULAR SEPTAL DEFECT (VSD) REPAIR;  Surgeon: Aaron Isaac, MD;  Location: [place];  Service: Open Heart Surgery;  Laterality: N/A;   VSD REPAIR      Current Medications: Current Meds  Medication Sig   acetaminophen (TYLENOL) 500 MG tablet Take 1,000 mg by mouth daily as needed for moderate pain or headache.   amiodarone (PACERONE) 200 MG tablet Take 1 tablet (200 mg total) by mouth daily.   apixaban (ELIQUIS) 5 MG TABS tablet Take 1 tablet (5 mg total) by mouth 2 (two) times daily.   atorvastatin (LIPITOR) 20 MG tablet Take 1 tablet (20 mg total) by mouth  at bedtime.   furosemide (LASIX) 40 MG tablet Take 1 tablet (40 mg total) by mouth daily.   glipiZIDE (GLUCOTROL) 10 MG tablet Take 5-10 mg by mouth See admin instructions. '10mg'$  in the am and '5mg'$  in the pm   Glucose Blood (BLOOD GLUCOSE TEST STRIPS) STRP 1 strip by In Vitro route 2 (two) times daily.   isosorbide mononitrate (IMDUR) 30 MG 24 hr tablet Take 0.5 tablets (15 mg total) by mouth daily.   losartan (COZAAR) 50 MG tablet Take 1 tablet (50 mg total) by mouth daily.   metFORMIN (GLUCOPHAGE) 1000 MG tablet Take 1,000 mg by mouth 2 (two) times daily.   metoprolol succinate (TOPROL-XL) 50 MG 24 hr tablet Take 1.5 tablets (75 mg) by mouth once daily. Take with or immediately following a meal. (Patient taking differently: 100 mg daily. Take 1.5 tablets (75 mg) by mouth once daily. Take with or immediately following a meal.)   omeprazole (PRILOSEC)  40 MG capsule Take 40 mg by mouth daily before breakfast.    pioglitazone (ACTOS) 30 MG tablet Take 30 mg by mouth daily.   sertraline (ZOLOFT) 50 MG tablet Take 50 mg by mouth daily.   traMADol (ULTRAM) 50 MG tablet Take One tab PO Q6 hours PRN pain     Allergies:   Biaxin [clarithromycin]   Social History   Socioeconomic History   Marital status: Married    Spouse name: Not on file   Number of children: Not on file   Years of education: Not on file   Highest education level: Not on file  Occupational History   Occupation: Manufacturing    Comment: Heavy work at times  Tobacco Use   Smoking status: Never    Passive exposure: Never   Smokeless tobacco: Never  Vaping Use   Vaping Use: Never used  Substance and Sexual Activity   Alcohol use: No   Drug use: No   Sexual activity: Not Currently  Other Topics Concern   Not on file  Social History Narrative   Still works full time. Works around the yard. Lives with wife. Has 2 sisters, neither with cardiac issues.   Social Determinants of Health   Financial Resource Strain: Not on  file  Food Insecurity: Not on file  Transportation Needs: Not on file  Physical Activity: Not on file  Stress: Not on file  Social Connections: Not on file     Family History: The patient's family history includes Cancer in his mother; Diabetes in his mother.  ROS:   Please see the history of present illness.    All other systems reviewed and are negative.  EKGs/Labs/Other Studies Reviewed:    The following studies were reviewed today:    Recent Labs: 05/31/2022: B Natriuretic Peptide 247.1; BUN 20; Creatinine, Ser 1.23; Hemoglobin 11.8; Platelets 208; Potassium 4.1; Sodium 140  Recent Lipid Panel    Component Value Date/Time   CHOL 142 04/17/2013 0833   CHOL 192 10/29/2011 0429   TRIG 125 04/17/2013 0833   TRIG 150 10/29/2011 0429   HDL 41 04/17/2013 0833   HDL 32 (L) 10/29/2011 0429   CHOLHDL 3.5 04/17/2013 0833   VLDL 30 10/29/2011 0429   LDLCALC 76 04/17/2013 0833   LDLCALC 130 (H) 10/29/2011 0429    Physical Exam:    VS:  BP (!) 144/62   Pulse 64   Ht 6' (1.829 m)   Wt 213 lb (96.6 kg)   SpO2 97%   BMI 28.89 kg/m     Wt Readings from Last 3 Encounters:  07/05/22 213 lb (96.6 kg)  06/20/22 214 lb (97.1 kg)  05/31/22 211 lb 10.3 oz (96 kg)     GEN:  Well nourished, well developed in no acute distress CARDIAC: RRR, 3 out of 6 holosystolic murmur throughout the precordium.   RESPIRATORY:  Clear to auscultation without rales, wheezing or rhonchi        ASSESSMENT:    1. Persistent atrial fibrillation ([place])   2. Essential hypertension   3. Encounter for long-term (current) use of high-risk medication   4. VSD (ventricular septal defect)    PLAN:    In order of problems listed above:    #Persistent AF/AFL S/p MAZE at time of CABG. Maintaining sinus rhythm. On eliquis for stroke ppx. On amiodarone.  Will make sure they check a CMP, TSH and free T4 when he goes in for his pre-MRI blood work.  #Hypertension Slightly above  goal today.  He had  a recent adjustment in his losartan.  Recommend checking blood pressures 1-2 times per week at home and recording the values.  Recommend bringing these recordings to the primary care physician.  #VSD Loud murmur throughout precordium.  Has been evaluated by Dr. Saunders Revel in the past.  Has a MRI scheduled by Aaron Jones coming up.  Has been referred to the congenital cardiology clinic at [facility]  Follow-up 6 months with APP.      Medication Adjustments/Labs and Tests Ordered: Current medicines are reviewed at length with the patient today.  Concerns regarding medicines are outlined above.  Orders Placed This Encounter  Procedures   Comprehensive metabolic panel   TSH   T4, free   No orders of the defined types were placed in this encounter.    Signed, Lars Mage, MD, [facility], [facility] 07/05/2022 1:25 PM    Electrophysiology [facility]

## 2022-07-04 NOTE — Telephone Encounter (Signed)
-----  Message from Rise Mu, PA-C sent at 07/03/2022  7:24 AM EST ----- CT showed stable aortic root measuring 4.2 cm with no aneurysmal dilatation of the ascending thoracic aorta.  There was also aortic atherosclerosis and coronary artery calcification noted with cardiomegaly and right kidney stones.  With regards to enlarged pulmonary artery, most recent echo showed only mildly elevated PASP.  We can further evaluate this on cardiac MRI, which is scheduled for next month.

## 2022-07-04 NOTE — Telephone Encounter (Signed)
Left voicemail message to call back for review of results.  

## 2022-07-05 ENCOUNTER — Encounter: Payer: Self-pay | Admitting: Cardiology

## 2022-07-05 ENCOUNTER — Ambulatory Visit: Payer: Medicare Other | Attending: Cardiology | Admitting: Cardiology

## 2022-07-05 VITALS — BP 144/62 | HR 64 | Ht 72.0 in | Wt 213.0 lb

## 2022-07-05 DIAGNOSIS — I10 Essential (primary) hypertension: Secondary | ICD-10-CM | POA: Diagnosis not present

## 2022-07-05 DIAGNOSIS — I4819 Other persistent atrial fibrillation: Secondary | ICD-10-CM

## 2022-07-05 DIAGNOSIS — Z79899 Other long term (current) drug therapy: Secondary | ICD-10-CM

## 2022-07-05 DIAGNOSIS — Q210 Ventricular septal defect: Secondary | ICD-10-CM | POA: Diagnosis not present

## 2022-07-05 NOTE — Patient Instructions (Signed)
Medication Instructions:  Your physician recommends that you continue on your current medications as directed. Please refer to the Current Medication list given to you today.  *If you need a refill on your cardiac medications before your next appointment, please call your pharmacy*   Lab Work: CMET, TSH, T4 You will get your lab work at [place] ([facility]) hospital.  Your lab work will be done at the [facility] next to the [facility]. These are walk in labs- you will not need an appointment and you do not need to be fasting.     Follow-Up: At [facility], you and your health needs are our priority.  As part of our continuing mission to provide you with exceptional heart care, we have created designated Provider Care Teams.  These Care Teams include your primary Cardiologist (physician) and Advanced Practice Providers (APPs -  Physician Assistants and Nurse Practitioners) who all work together to provide you with the care you need, when you need it.  Your next appointment:   6 month(s)  Provider:   You will see one of the following Advanced Practice Providers on your designated Care Team:   Renee Ursuy, PA-C Michael "Andy" Tillery, PA-C Suzann Riddle, NP  

## 2022-07-05 NOTE — Telephone Encounter (Signed)
Left voicemail message to call back.  

## 2022-07-06 NOTE — Telephone Encounter (Signed)
Left voicemail message. If patient should call back please reach out to me.

## 2022-07-06 NOTE — Telephone Encounter (Signed)
Patient's wife is returning call. 

## 2022-07-11 ENCOUNTER — Telehealth: Payer: Self-pay | Admitting: *Deleted

## 2022-07-11 ENCOUNTER — Encounter: Payer: Self-pay | Admitting: *Deleted

## 2022-07-11 NOTE — Telephone Encounter (Signed)
Letter has been done so closing encounter.

## 2022-07-11 NOTE — Telephone Encounter (Signed)
-----   Message from Rise Mu, PA-C sent at 07/03/2022  7:24 AM EST ----- CT showed stable aortic root measuring 4.2 cm with no aneurysmal dilatation of the ascending thoracic aorta.  There was also aortic atherosclerosis and coronary artery calcification noted with cardiomegaly and right kidney stones.  With regards to enlarged pulmonary artery, most recent echo showed only mildly elevated PASP.  We can further evaluate this on cardiac MRI, which is scheduled for next month.

## 2022-07-11 NOTE — Telephone Encounter (Signed)
Left voicemail message to call back for review of results and that I would mail him letter with results.

## 2022-07-12 NOTE — Telephone Encounter (Signed)
Pt is requesting call back to discuss results.

## 2022-07-13 NOTE — Telephone Encounter (Signed)
Reviewed results and recommendations with patient and he verbalized understanding with no further questions at this time.

## 2022-07-21 ENCOUNTER — Telehealth: Payer: Self-pay | Admitting: Physician Assistant

## 2022-07-21 NOTE — Telephone Encounter (Signed)
Patient is calling to talk with Christell Faith or nurse. Please call back

## 2022-07-21 NOTE — Telephone Encounter (Signed)
Spoke with patient in regards to a letter with his results from most recent imaging. Read the results to patient over the phone and reminded patient that he has more imagine scheduled for 08/02/22 and a follow-up appointment in March to discuss the findings of both procedures. Patient voiced understanding.

## 2022-07-26 ENCOUNTER — Other Ambulatory Visit
Admission: RE | Admit: 2022-07-26 | Discharge: 2022-07-26 | Disposition: A | Discharge status: Home / Self Care | Payer: Medicare Other | Source: Ambulatory Visit | Attending: Cardiology | Admitting: Cardiology

## 2022-07-26 DIAGNOSIS — I10 Essential (primary) hypertension: Secondary | ICD-10-CM | POA: Insufficient documentation

## 2022-07-26 DIAGNOSIS — I4819 Other persistent atrial fibrillation: Secondary | ICD-10-CM | POA: Diagnosis present

## 2022-07-26 LAB — COMPREHENSIVE METABOLIC PANEL
ALT: 12 U/L (ref 0–44)
AST: 22 U/L (ref 15–41)
Albumin: 3.9 g/dL (ref 3.5–5.0)
Alkaline Phosphatase: 45 U/L (ref 38–126)
Anion gap: 7 (ref 5–15)
BUN: 15 mg/dL (ref 8–23)
CO2: 26 mmol/L (ref 22–32)
Calcium: 9.1 mg/dL (ref 8.9–10.3)
Chloride: 102 mmol/L (ref 98–111)
Creatinine, Ser: 1.12 mg/dL (ref 0.61–1.24)
GFR, Estimated: >60 mL/min (ref >60)
Glucose, Bld: 61 mg/dL — ABNORMAL LOW (ref 70–99)
Potassium: 4 mmol/L (ref 3.5–5.1)
Sodium: 135 mmol/L (ref 135–145)
Total Bilirubin: 0.6 mg/dL (ref 0.3–1.2)
Total Protein: 7.4 g/dL (ref 6.5–8.1)

## 2022-07-26 LAB — TSH: TSH: 2.009 u[IU]/mL (ref 0.350–4.500)

## 2022-07-26 LAB — T4, FREE: Free T4: 1.06 ng/dL (ref 0.61–1.12)

## 2022-08-01 ENCOUNTER — Telehealth (HOSPITAL_COMMUNITY): Payer: Self-pay | Admitting: Emergency Medicine

## 2022-08-01 ENCOUNTER — Other Ambulatory Visit (HOSPITAL_COMMUNITY): Payer: Self-pay | Admitting: Physician Assistant

## 2022-08-01 DIAGNOSIS — I712 Thoracic aortic aneurysm, without rupture, unspecified: Secondary | ICD-10-CM

## 2022-08-01 NOTE — Telephone Encounter (Signed)
Reaching out to patient to offer assistance regarding upcoming cardiac imaging study; pt verbalizes understanding of appt date/time, parking situation and where to check in, pre-test NPO status and medications ordered, and verified current allergies; name and call back number provided for further questions should they arise Aaron Bond RN Navigator Cardiac Imaging [facility] [phone] office [phone] cell  Arrival [address] Denies metal  Denies iv issues Denies claustro

## 2022-08-02 ENCOUNTER — Ambulatory Visit
Admission: RE | Admit: 2022-08-02 | Discharge: 2022-08-02 | Disposition: A | Discharge status: Home / Self Care | Payer: Medicare Other | Source: Ambulatory Visit | Attending: Physician Assistant | Admitting: Physician Assistant

## 2022-08-02 ENCOUNTER — Other Ambulatory Visit: Payer: Self-pay | Admitting: Physician Assistant

## 2022-08-02 DIAGNOSIS — Q210 Ventricular septal defect: Secondary | ICD-10-CM | POA: Diagnosis present

## 2022-08-02 DIAGNOSIS — I712 Thoracic aortic aneurysm, without rupture, unspecified: Secondary | ICD-10-CM | POA: Diagnosis present

## 2022-08-02 DIAGNOSIS — Z951 Presence of aortocoronary bypass graft: Secondary | ICD-10-CM

## 2022-08-02 DIAGNOSIS — Z8774 Personal history of (corrected) congenital malformations of heart and circulatory system: Secondary | ICD-10-CM

## 2022-08-02 DIAGNOSIS — I4819 Other persistent atrial fibrillation: Secondary | ICD-10-CM

## 2022-08-02 DIAGNOSIS — I422 Other hypertrophic cardiomyopathy: Secondary | ICD-10-CM | POA: Diagnosis not present

## 2022-08-02 DIAGNOSIS — I25118 Atherosclerotic heart disease of native coronary artery with other forms of angina pectoris: Secondary | ICD-10-CM

## 2022-08-02 DIAGNOSIS — I350 Nonrheumatic aortic (valve) stenosis: Secondary | ICD-10-CM

## 2022-08-02 DIAGNOSIS — I5022 Chronic systolic (congestive) heart failure: Secondary | ICD-10-CM | POA: Insufficient documentation

## 2022-08-02 DIAGNOSIS — I5020 Unspecified systolic (congestive) heart failure: Secondary | ICD-10-CM

## 2022-08-02 MED ORDER — GADOBUTROL 1 MMOL/ML IV SOLN
13.0000 mL | Freq: Once | INTRAVENOUS | Status: AC | PRN
  Administered 2022-08-02: 13 mL via INTRAVENOUS

## 2022-08-21 NOTE — Progress Notes (Signed)
Cardiology Office Note    Date:  08/25/2022   ID:  Aaron Jones, Aaron Jones 05/01/1946, MRN [id number]  PCP:  Kathlyn Sacramento, MD  Cardiologist:  Nelva Bush, MD  Electrophysiologist:  Cristopher Peru, MD   Chief Complaint: Follow up  History of Present Illness:   Aaron Jones is a 77 y.o. male with history of CAD status post single-vessel CABG with SVG to PDA, VSD status post open repair with Dacron patch in 2014, persistent A-fib status post Maze and left atrial appendage clipping during CABG, HCM, moderate aortic stenosis, PAT, thoracic aortic aneurysm, HTN, HLD, DM2, and OSA not on CPAP who presents for follow-up of cardiac MRI.   He was previously followed by EP with recommendation to establish with general cardiology in 07/2021 due to increasing shortness of breath and fatigue similar to what he experienced leading up to his CABG and VSD repair in 2014.   CTA of the chest/aorta in 2021 showed unchanged enlargement of the sinuses of Valsalva measuring up to 4.3 cm, normal caliber ascending thoracic aorta measuring up to 3.5 x 3.5 cm, unchanged enlargement of the proximal descending thoracic aorta measuring up to 3.1 x 3.0 cm.   Echo from 12/2020 demonstrated an EF of 45 to 50%, global hypokinesis, moderate LVH, prior dacryon patch VSD repair compromised with left to right shunting noted at the membranous septum with a peak gradient of 122 mmHg, normal RV systolic function and ventricular cavity size, mildly elevated PASP estimated at 39.8 mmHg, severely dilated left atrium, moderately dilated right atrium, trivial mitral regurgitation with moderate mitral annular calcification, moderate tricuspid regurgitation, trivial aortic insufficiency, borderline dilatation of the ascending aorta measuring 38 mm, and a rhythm that appeared to be atrial flutter.   Zio patch from 04/2021 showed a predominant rhythm of sinus with no evidence of A-fib with an average rate of 77 bpm, 47 episodes of SVT  with the longest interval lasting 19 beats, occasional PVCs representing 3.7%, and a PAC burden of 4.1%.   R/LHC from 07/2021 showed moderate to severe, but noncritical disease involving the ostial D1, ramus intermedius, and RPDA with widely patent SVG to RPDA.  Mildly elevated left heart filling pressures with prominent V waves noted on PCWP tracing, severely elevated right heart filling pressures, moderate pulmonary hypertension, low normal to mildly reduced cardiac output/index, and no significant intracardiac shunting.   Echo in 11/2021 demonstrated an EF of 55 to 60%, no regional wall motion abnormalities, moderate concentric LVH, status post dacryon patch VSD repair with moderately sized membranous VSD with left-to-right shunting with a peak gradient of 137 mmHg, low normal RV systolic function, normal ventricular cavity size, moderately increased RV wall thickness, mildly elevated PASP estimated at 36.8 mmHg, severely dilated left atrium, mild aortic insufficiency, moderate aortic stenosis with a mean gradient of 19 mmHg and a valve area by VTI of 1.18 cm, and an estimated right atrial pressure of 3 mmHg.   Historically, he has reported daytime somnolence as he was staying up at night to watch his wife with MS, and was sleeping during the day.  He has been intolerant to CPAP mask with known OSA.   He was seen in the ED on 05/31/2022 with chest pain, dyspnea, and orthopnea.  Initial high-sensitivity troponin 17 with a delta of troponin 19.  BNP 247.  Chest x-ray showed cardiomegaly with pulmonary vascular congestion.  He was given IV Lasix with symptomatic improvement.  I saw him in 06/2022, at which time  he remained without angina or cardiac decompensation.  He continued to note baseline fatigue with poor sleep hygiene.  Given his history of VSD status post repair with concern for shunting, he was referred to the adult congenital heart clinic at [facility] to establish care and for ongoing management.  He  was maintaining sinus rhythm at his most recent EP appointment in late 06/2022.  Cardiac MRI on 08/02/2022 showed an EF of 55%, severe asymmetric LV septal hypertrophy with basal septal wall measuring 20 mm, mid-wall LGE in the LV myocardium at the RV insertion points, small membranous VSD with left-to-right shunting with Qp:Qs 1.3, normal RV systolic function and size.  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   He comes in accompanied by his sister-in-law today and is without symptoms of angina or cardiac decompensation.  No dyspnea, palpitations, dizziness, presyncope, or syncope.  He did have one fall since he was last seen which was attributed to low blood sugar as he had not eaten and symptoms improved with a Pepsi.  He did not suffer LOC or hit his head.  No symptoms of hematochezia or melena.  He is adherent to cardiac medications without issues.  He indicates he would not want to pursue invasive repair of his VSD at this time given his wife.  Not checking blood pressure at home.  Otherwise, he does not have any acute concerns at today's visit.   Labs independently reviewed: 07/2022 - potassium 4.0, BUN 15, SCr 1.12, albumin 3.9, AST/ALT normal, TSH normal 07/2022 - A1c 6.8 05/2022 - Hgb 11.8, PLT 208, 03/2022 - TC 109, TG 79, HDL 32, LDL 61   Past Medical History:  Diagnosis Date   Atrial fibrillation with rapid ventricular response ([place]) 01/16/2013   a. 01/2013 s/p R & L Maze and LAA clipping in setting of VSD repair;  b. Prev on amiodarone;  c. CHA2DS2VASc = 4-->Apixaban;  c. 01/2013 Recurrent afib/flutter requiring DCCV.   CHF (congestive heart failure) (HCC)    Colon polyp    COPD (chronic obstructive pulmonary disease) ([place])    Coronary artery disease    a. 01/2013 s/p CABG x 1 (VG->PDA) in setting of VSD repair.   GERD (gastroesophageal reflux disease)    Hyperlipidemia    Hypertension    PCP- [place], phone; [phone]   Mitral valve  disorder    Obesity    PAT (paroxysmal atrial tachycardia)    a. 02/2015 s/p DCCV;  b. 12/2016 recurrent PAT - unchanged with escalating BB dose (rates 106-107).   PSVT (paroxysmal supraventricular tachycardia)    a. 10/2011 Admitted to [facility] w/ SVT-->broke with IV dilt.   Recurrent Nephrolithiasis    Schatzki's ring    Sleep apnea    [facility]- in process of being evaluated now, states 12 yrs. ago was on CPAP but after having his deviated septum repaired, he had put the CPAP in storage. Pt. will get a new machine soon.    Thoracic aortic aneurysm ([place])    a. 01/2016 CTA chest: 4.0 cm Asc Ao; b. 01/2017 Echo: nl Ao root size.   Type II diabetes mellitus ([place])    Umbilical hernia    "not repaired" (01/16/2013)   VSD (ventricular septal defect) w/ persistent systolic murmur    a. [id number] s/p VSD closure (dacron patch);  b. 01/2017 Echo: EF 50-55%, no rwma, mild AS, mild MR, mod dil LA, dilated RV, sev dil RA, mild TR. PASP nl.    Past Surgical History:  Procedure Laterality Date   CARDIAC CATHETERIZATION  > 5 yr ago   Done at [facility], reportedly clean   CARDIAC CATHETERIZATION  11/03/2012   CARDIOVERSION N/A 02/12/2015   Procedure: CARDIOVERSION;  Surgeon: Thayer Headings, MD;  Location: [place];  Service: Cardiovascular;  Laterality: N/A;   CARDIOVERSION N/A 04/27/2017   Procedure: CARDIOVERSION;  Surgeon: Evans Lance, MD;  Location: [place] CV LAB;  Service: Cardiovascular;  Laterality: N/A;   CLIPPING OF ATRIAL APPENDAGE N/A 01/07/2013   Procedure: CLIPPING OF ATRIAL APPENDAGE;  Surgeon: Grace Isaac, MD;  Location: [place];  Service: Open Heart Surgery;  Laterality: N/A;   COLONOSCOPY W/ POLYPECTOMY     COLONOSCOPY WITH PROPOFOL N/A 01/05/2021   Procedure: COLONOSCOPY WITH PROPOFOL;  Surgeon: Toledo, Benay Pike, MD;  Location: [facility] ENDOSCOPY;  Service: Gastroenterology;  Laterality: N/A;   CORONARY ARTERY BYPASS GRAFT N/A 01/07/2013   Procedure: CORONARY ARTERY BYPASS GRAFTING  (CABG);  Surgeon: Grace Isaac, MD;  Location: [place];  Service: Open Heart Surgery;  Laterality: N/A;  Times1 using endoscopically harvested saphenous vein graft to the PDA   ESOPHAGOGASTRODUODENOSCOPY N/A 01/05/2021   Procedure: ESOPHAGOGASTRODUODENOSCOPY (EGD);  Surgeon: Toledo, Benay Pike, MD;  Location: [facility] ENDOSCOPY;  Service: Gastroenterology;  Laterality: N/A;  DM   EYE SURGERY Left 02/03/1989   "clipped muscle so it wouldn't be pulling up" (01/16/2013)   INTRAOPERATIVE TRANSESOPHAGEAL ECHOCARDIOGRAM N/A 01/07/2013   Procedure: INTRAOPERATIVE TRANSESOPHAGEAL ECHOCARDIOGRAM;  Surgeon: Grace Isaac, MD;  Location: [place];  Service: Open Heart Surgery;  Laterality: N/A;   LEFT AND RIGHT HEART CATHETERIZATION WITH CORONARY ANGIOGRAM N/A 11/20/2012   Procedure: LEFT AND RIGHT HEART CATHETERIZATION WITH CORONARY ANGIOGRAM;  Surgeon: Jolaine Artist, MD;  Location: [facility] CATH LAB;  Service: Cardiovascular;  Laterality: N/A;   MAZE N/A 01/07/2013   Procedure: MAZE;  Surgeon: Grace Isaac, MD;  Location: [place];  Service: Open Heart Surgery;  Laterality: N/A;   MULTIPLE EXTRACTIONS WITH ALVEOLOPLASTY N/A 12/11/2012   Procedure: Extraction of tooth #'s [id number] wioth alveoloplasty and bialteral fibrous tuberosity reductions.;  Surgeon: Lenn Cal, DDS;  Location: [facility];  Service: Oral Surgery;  Laterality: N/A;   NASAL SEPTUM SURGERY  06/05/2002   RIGHT/LEFT HEART CATH AND CORONARY ANGIOGRAPHY N/A 08/02/2021   Procedure: RIGHT/LEFT HEART CATH AND CORONARY ANGIOGRAPHY;  Surgeon: Nelva Bush, MD;  Location: [place] CV LAB;  Service: Cardiovascular;  Laterality: N/A;   sepfal deveation repair     TEE WITHOUT CARDIOVERSION N/A 11/21/2012   Procedure: TRANSESOPHAGEAL ECHOCARDIOGRAM (TEE);  Surgeon: Jolaine Artist, MD;  Location: [facility]COPY;  Service: Cardiovascular;  Laterality: N/A;   VSD REPAIR N/A 01/07/2013   Procedure: VENTRICULAR  SEPTAL DEFECT (VSD) REPAIR;  Surgeon: Grace Isaac, MD;  Location: [place];  Service: Open Heart Surgery;  Laterality: N/A;   VSD REPAIR      Current Medications: Current Meds  Medication Sig   acetaminophen (TYLENOL) 500 MG tablet Take 1,000 mg by mouth daily as needed for moderate pain or headache.   amiodarone (PACERONE) 200 MG tablet Take 1 tablet (200 mg total) by mouth daily.   atorvastatin (LIPITOR) 20 MG tablet Take 1 tablet (20 mg total) by mouth at bedtime.   furosemide (LASIX) 40 MG tablet Take 1 tablet (40 mg total) by mouth daily.   glipiZIDE (GLUCOTROL) 10 MG tablet Take 5-10 mg by mouth See admin instructions. 10mg  in the am and 5mg  in the pm   Glucose Blood (  BLOOD GLUCOSE TEST STRIPS) STRP 1 strip by In Vitro route 2 (two) times daily.   isosorbide mononitrate (IMDUR) 30 MG 24 hr tablet Take 0.5 tablets (15 mg total) by mouth daily.   losartan (COZAAR) 100 MG tablet Take 1 tablet (100 mg total) by mouth daily.   metFORMIN (GLUCOPHAGE) 1000 MG tablet Take 1,000 mg by mouth 2 (two) times daily.   metoprolol succinate (TOPROL-XL) 50 MG 24 hr tablet Take 1.5 tablets (75 mg) by mouth once daily. Take with or immediately following a meal. (Patient taking differently: 100 mg daily. Take 1.5 tablets (75 mg) by mouth once daily. Take with or immediately following a meal.)   omeprazole (PRILOSEC) 40 MG capsule Take 40 mg by mouth daily before breakfast.    pioglitazone (ACTOS) 30 MG tablet Take 30 mg by mouth daily.   sertraline (ZOLOFT) 50 MG tablet Take 50 mg by mouth daily.   traMADol (ULTRAM) 50 MG tablet Take One tab PO Q6 hours PRN pain   [DISCONTINUED] apixaban (ELIQUIS) 5 MG TABS tablet Take 1 tablet (5 mg total) by mouth 2 (two) times daily.   [DISCONTINUED] losartan (COZAAR) 50 MG tablet Take 1 tablet (50 mg total) by mouth daily.    Allergies:   Biaxin [clarithromycin]   Social History   Socioeconomic History   Marital status: Married    Spouse name: Not on file    Number of children: Not on file   Years of education: Not on file   Highest education level: Not on file  Occupational History   Occupation: Manufacturing    Comment: Heavy work at times  Tobacco Use   Smoking status: Never    Passive exposure: Never   Smokeless tobacco: Never  Vaping Use   Vaping Use: Never used  Substance and Sexual Activity   Alcohol use: No   Drug use: No   Sexual activity: Not Currently  Other Topics Concern   Not on file  Social History Narrative   Still works full time. Works around the yard. Lives with wife. Has 2 sisters, neither with cardiac issues.   Social Determinants of Health   Financial Resource Strain: Not on file  Food Insecurity: Not on file  Transportation Needs: Not on file  Physical Activity: Not on file  Stress: Not on file  Social Connections: Not on file     Family History:  The patient's family history includes Cancer in his mother; Diabetes in his mother.  ROS:   12-point review of systems is negative unless otherwise noted in the HPI.   EKGs/Labs/Other Studies Reviewed:    Studies reviewed were summarized above. The additional studies were reviewed today:  cMRI 08/02/2022: IMPRESSION: 1. Normal LV systolic function.  LVEF 55%. 2. Severe asymmetric LV septal hypertrophy, basal septal wall measuring 1mm in thickness. 3. There is midwall late gadolinium enhancement in the left ventricular myocardium at the RV insertion points. 4. There is a small membraneous ventricular septal defect with left-to-right shunting. Qp:Qs = 1.3 5. Normal RV size and function. 6. 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 ___________  Limited echo 11/25/2021: 1. Left ventricular ejection fraction, by estimation, is 55 to 60%. The  left ventricle has normal function. The left ventricle has no regional  wall motion abnormalities. There is moderate concentric left ventricular  hypertrophy. The average left   ventricular global longitudinal strain is -14.3 %.   2. s/p Dacron Patch VSD repair. There is a moderately sized membranous  ventricular septal defect with  left to right shunting, peak gradient 137  mm Hg.   3. Right ventricular systolic function is low normal. The right  ventricular size is normal. Moderately increased right ventricular wall  thickness. There is mildly elevated pulmonary artery systolic pressure.  The estimated right ventricular systolic  pressure is [id number] mmHg.   4. Left atrial size was severely dilated.   5. The mitral valve is normal in structure. No evidence of mitral valve  regurgitation. No evidence of mitral stenosis.   6. The aortic valve is normal in structure. Aortic valve regurgitation is  mild. Moderate aortic valve stenosis. Aortic valve area, by VTI measures  1.18 cm. Aortic valve mean gradient measures 19.0 mmHg. Aortic valve Vmax  measures 2.89 m/s.   7. The inferior vena cava is normal in size with greater than 50%  respiratory variability, suggesting right atrial pressure of 3 mmHg.  __________   [facility] 08/02/2021: Conclusions: Moderate-severe but non-critical disease involving ostial D1, ramus intermedius, and rPDA. Widely patent SVG-rPDA. Mildly elevated left heart filling pressures with prominent V-waves noted on PCWP tracing (PCWP 26 mmHg; LVEDP 20-25 mmHg). Severely elevated right heart filling pressures (RAP/RVEDP 16 mmHg). Moderate pulmonary hypertension (mean PAP 40 mmHg; PVR 2.8 WU). Low normal to mildly reduced Fick cardiac output/index (CO 4.9 L/min, CI 2.3 L/min/m). No significant intracardiac shunting (QPS 1.0-1.2).   Recommendations: Escalate diuresis: We will increase furosemide to 40 mg twice daily. Add isosorbide mononitrate 15 mg daily for antianginal therapy. Increased goal-directed medical therapy for nonischemic cardiomyopathy as an outpatient, as tolerated. Continue clinical and echocardiographic monitoring of VSD; shunting  does not appear to be significant at this time. Consider referral to advanced heart failure clinic if symptoms persist. Restart apixaban tomorrow morning if no evidence of bleeding/vascular injury. __________   Elwyn Reach patch 04/2021: HR 52 - 231, average 77 bpm. No atrial fibrillation detected. 47 SVT, longest lasting 19 beats. Occasional ventricular ectopy, 3.7%. Rare supraventricular ectopy, 4.1%. No sustained arrhythmias. __________   2D echo 12/28/2020: 1. Rhythm appears to be atrial flutter - no A waves noted.   2. Prior dacron patch VSD repair is compromised with left to right  shunting noted at the membranous septum (images 35, 45) - peak gradient is  123XX123 mmHg (systolic BP). The neck of the fenestration measures about 0.5  cm. Left ventricular ejection fraction,   by estimation, is 45 to 50%. The left ventricle has mildly decreased  function. The left ventricle demonstrates global hypokinesis. There is  moderate left ventricular hypertrophy. Left ventricular diastolic function  could not be evaluated.   3. Right ventricular systolic function is normal. The right ventricular  size is normal. There is mildly elevated pulmonary artery systolic  pressure. The estimated right ventricular systolic pressure is [id number] mmHg.   4. Left atrial size was severely dilated.   5. Right atrial size was moderately dilated.   6. The mitral valve is abnormal. Trivial mitral valve regurgitation.  Moderate mitral annular calcification.   7. Tricuspid valve regurgitation is moderate.   8. The aortic valve is abnormal. Aortic valve regurgitation is trivial.   9. Aortic dilatation noted. There is borderline dilatation of the  ascending aorta, measuring 38 mm.  10. The inferior vena cava is normal in size with <50% respiratory  variability, suggesting right atrial pressure of 8 mmHg.   Comparison(s): Changes from prior study are noted. 06/14/2018: LVEF  55-60%,.  __________   2D echo 06/14/2018: -  Left ventricle: The cavity size was normal.  There was moderate    concentric hypertrophy. Systolic function was normal. The    estimated ejection fraction was in the range of 55% to 60%. Wall    motion was normal; there were no regional wall motion    abnormalities. Features are consistent with a pseudonormal left    ventricular filling pattern, with concomitant abnormal relaxation    and increased filling pressure (grade 2 diastolic dysfunction).  - Aortic valve: Valve mobility was restricted. There was mild    stenosis. There was moderate regurgitation. Peak velocity (S):    233 cm/s. Mean gradient (S): 13 mm Hg. Peak gradient (S): 22 mm    Hg.  - Mitral valve: Transvalvular velocity was within the normal range.    There was no evidence for stenosis. There was moderate    regurgitation.  - Left atrium: The atrium was severely dilated.  - Right ventricle: The cavity size was mildly dilated. Wall    thickness was normal. Systolic function was normal.  - Atrial septum: A patent foramen ovale cannot be excluded.  - Tricuspid valve: There was mild regurgitation.  - Pulmonary arteries: Systolic pressure was mildly increased. PA    peak pressure: 41 mm Hg (S).  __________   See CV Studies in Epic for more remote imaging   EKG:  EKG is ordered today.  The EKG ordered today demonstrates sinus bradycardia, 55 bpm, bifascicular block (similar to prior tracing)  Recent Labs: 05/31/2022: B Natriuretic Peptide 247.1; Hemoglobin 11.8; Platelets 208 07/26/2022: ALT 12; BUN 15; Creatinine, Ser 1.12; Potassium 4.0; Sodium 135; TSH 2.009  Recent Lipid Panel    Component Value Date/Time   CHOL 142 04/17/2013 0833   CHOL 192 10/29/2011 0429   TRIG 125 04/17/2013 0833   TRIG 150 10/29/2011 0429   HDL 41 04/17/2013 0833   HDL 32 (L) 10/29/2011 0429   CHOLHDL 3.5 04/17/2013 0833   VLDL 30 10/29/2011 0429   LDLCALC 76 04/17/2013 0833   LDLCALC 130 (H) 10/29/2011 0429    PHYSICAL EXAM:    VS:   BP (!) 161/72 (BP Location: Right Arm, Patient Position: Sitting, Cuff Size: Normal)   Pulse (!) 55   Ht 6' (1.829 m)   Wt 209 lb 12.8 oz (95.2 kg)   SpO2 99%   BMI 28.45 kg/m   BMI: Body mass index is 28.45 kg/m.  Physical Exam Vitals reviewed.  Constitutional:      Appearance: He is well-developed.  HENT:     Head: Normocephalic and atraumatic.  Eyes:     General:        Right eye: No discharge.        Left eye: No discharge.  Neck:     Vascular: No JVD.  Cardiovascular:     Rate and Rhythm: Normal rate and regular rhythm.     Pulses:          Posterior tibial pulses are 2+ on the right side and 2+ on the left side.     Heart sounds: S1 normal and S2 normal. Heart sounds not distant. No midsystolic click and no opening snap. Murmur heard.     Harsh midsystolic murmur is present with a grade of 2/6 at the upper right sternal border radiating to the neck.     No friction rub.  Pulmonary:     Effort: Pulmonary effort is normal. No respiratory distress.     Breath sounds: Normal breath sounds. No decreased breath sounds, wheezing or rales.  Chest:  Chest wall: No tenderness.  Abdominal:     General: There is no distension.  Musculoskeletal:     Cervical back: Normal range of motion.     Right lower leg: No edema.     Left lower leg: No edema.  Skin:    General: Skin is warm and dry.     Nails: There is no clubbing.  Neurological:     Mental Status: He is alert and oriented to person, place, and time.  Psychiatric:        Speech: Speech normal.        Behavior: Behavior normal.        Thought Content: Thought content normal.        Judgment: Judgment normal.     Wt Readings from Last 3 Encounters:  08/25/22 209 lb 12.8 oz (95.2 kg)  07/05/22 213 lb (96.6 kg)  06/20/22 214 lb (97.1 kg)     ASSESSMENT & PLAN:   CAD status post CABG without angina: He is without symptoms of angina or cardiac decompensation.  He remains on apixaban in place of aspirin given  underlying persistent A-fib to minimize bleeding risk.  Continue aggressive risk factor modification and secondary prevention including atorvastatin, isosorbide mononitrate, and metoprolol succinate.  No indication for further ischemic testing at this time.  HFimpEF/HCM: Most recent echo from 11/2021 showed improvement in LV systolic function with an EF of 55-60%.  Continue current GDMT with titration of losartan to 100 mg daily and continuation of metoprolol succinate.  Defer further escalation of GDMT at this time given asymptomatic presentation and in the setting of normalization of LV systolic function.  Should he have recurrence of symptoms, would look to transition ARB to ARNI along with potential addition of MRA and SGLT2 inhibitor   VSD status post Dacron repair: Previously noted to have dacryon graft compromise with a left to right shunt a peak gradient of 122 mmHg with normal RV cavity size by echo in 12/2020. R/LHC with shunt run in 07/2021 showed VSD shunting did not appear to be significant at that time. Most recent echo from 11/2021 again a moderately sized membranous VSD with left-to-right shunting with a peak gradient of 137 mmHg.  Cardiac MRI showed Qp: Qs 1.3.  He has been referred to the adult congenital clinic at [facility].  He does not have any teeth and no longer sees DDS.  Persistent Afib: Remains in sinus rhythm on metoprolol succinate.  CHA2DS2-VASc 5.  He remains on apixaban 5 mg twice daily on the reduced dosing criteria.  Recent labs stable.  No symptoms concerning for bleeding.   Aortic stenosis: Moderate on echo in 11/2021.  Update echo in 11/2022.  Thoracic aortic aneurysm: CTA chest/aorta in 06/2022 showed stable ectasia of the aortic root measuring 4.2 cm with no evidence of aneurysmal dilatation of the thoracic aorta and no specific imaging follow-up recommended.    HTN: Blood pressure elevated in the office today.  Titrate losartan to 100 mg daily.  Check BMP 1 week thereafter.  He  remains on Toprol-XL 100 mg and Imdur 15 mg.  HLD: LDL 61 in 03/2022, with normal AST/ALT at that time.  He remains on atorvastatin 20 mg.   Disposition: F/u with Dr. Saunders Revel or an APP in 6 months.   Medication Adjustments/Labs and Tests Ordered: Current medicines are reviewed at length with the patient today.  Concerns regarding medicines are outlined above. Medication changes, Labs and Tests ordered today are summarized above and listed in  the Patient Instructions accessible in Encounters.   Signed, Christell Faith, PA-C 08/25/2022 2:55 PM     [address], [place] [address] [phone]

## 2022-08-25 ENCOUNTER — Ambulatory Visit: Payer: Medicare Other | Attending: Physician Assistant | Admitting: Physician Assistant

## 2022-08-25 ENCOUNTER — Encounter: Payer: Self-pay | Admitting: Physician Assistant

## 2022-08-25 VITALS — BP 161/72 | HR 55 | Ht 72.0 in | Wt 209.8 lb

## 2022-08-25 DIAGNOSIS — I712 Thoracic aortic aneurysm, without rupture, unspecified: Secondary | ICD-10-CM | POA: Diagnosis present

## 2022-08-25 DIAGNOSIS — E785 Hyperlipidemia, unspecified: Secondary | ICD-10-CM | POA: Insufficient documentation

## 2022-08-25 DIAGNOSIS — I350 Nonrheumatic aortic (valve) stenosis: Secondary | ICD-10-CM | POA: Diagnosis present

## 2022-08-25 DIAGNOSIS — I4819 Other persistent atrial fibrillation: Secondary | ICD-10-CM | POA: Insufficient documentation

## 2022-08-25 DIAGNOSIS — Z8774 Personal history of (corrected) congenital malformations of heart and circulatory system: Secondary | ICD-10-CM | POA: Diagnosis present

## 2022-08-25 DIAGNOSIS — I10 Essential (primary) hypertension: Secondary | ICD-10-CM | POA: Diagnosis present

## 2022-08-25 DIAGNOSIS — Q210 Ventricular septal defect: Secondary | ICD-10-CM | POA: Diagnosis present

## 2022-08-25 DIAGNOSIS — I5022 Chronic systolic (congestive) heart failure: Secondary | ICD-10-CM | POA: Diagnosis present

## 2022-08-25 DIAGNOSIS — Z951 Presence of aortocoronary bypass graft: Secondary | ICD-10-CM | POA: Insufficient documentation

## 2022-08-25 DIAGNOSIS — I25118 Atherosclerotic heart disease of native coronary artery with other forms of angina pectoris: Secondary | ICD-10-CM | POA: Insufficient documentation

## 2022-08-25 MED ORDER — APIXABAN 5 MG PO TABS: 5.0000 mg | ORAL_TABLET | Freq: Two times a day (BID) | ORAL | 3 refills | Status: DC

## 2022-08-25 MED ORDER — LOSARTAN POTASSIUM 100 MG PO TABS: 100.0000 mg | ORAL_TABLET | Freq: Every day | ORAL | 3 refills | Status: DC

## 2022-08-25 NOTE — Patient Instructions (Addendum)
Medication Instructions:  Your physician has recommended you make the following change in your medication:   INCREASE Losartan to 100 mg once daily  *If you need a refill on your cardiac medications before your next appointment, please call your pharmacy*   Lab Work: BMET in one week. No appointment is needed. Just go to the following location and check in at registration:   [place] [facility] 1st desk on the right to check in (REGISTRATION)  Lab hours: Monday- Friday (7:30 am- 5:30 pm)  If you have labs (blood work) drawn today and your tests are completely normal, you will receive your results only by: MyChart Message (if you have MyChart) OR A paper copy in the mail If you have any lab test that is abnormal or we need to change your treatment, we will call you to review the results.   Testing/Procedures: Your physician has requested that you have a limited echocardiogram in June. Echocardiography is a painless test that uses sound waves to create images of your heart. It provides your doctor with information about the size and shape of your heart and how well your heart's chambers and valves are working. This procedure takes approximately one hour. There are no restrictions for this procedure. Please do NOT wear cologne, perfume, aftershave, or lotions (deodorant is allowed). Please arrive 15 minutes prior to your appointment time.    Follow-Up: At [facility], you and your health needs are our priority.  As part of our continuing mission to provide you with exceptional heart care, we have created designated Provider Care Teams.  These Care Teams include your primary Cardiologist (physician) and Advanced Practice Providers (APPs -  Physician Assistants and Nurse Practitioners) who all work together to provide you with the care you need, when you need it.   Your next appointment:   6 month(s)  Provider:   Nelva Bush, MD or Christell Faith, PA-C    Other  Instructions

## 2022-08-29 ENCOUNTER — Telehealth: Payer: Self-pay | Admitting: Physician Assistant

## 2022-08-29 NOTE — Telephone Encounter (Signed)
Contacted [facility] and spoke with [place]. She states that back in January they attempted to call patient x2 times and number was disconnected. They called additional number and left voicemail messages as well. Inquired what number should patient call to set up appointment and she provided her number [phone]. Will reach out to patient with this information.

## 2022-08-29 NOTE — Telephone Encounter (Signed)
Spoke with patient and provided him with number to the [facility]. He verbalized understanding with no further questions at this time.

## 2022-09-07 ENCOUNTER — Other Ambulatory Visit
Admission: RE | Admit: 2022-09-07 | Discharge: 2022-09-07 | Disposition: A | Discharge status: Home / Self Care | Payer: Medicare Other | Source: Ambulatory Visit | Attending: Physician Assistant | Admitting: Physician Assistant

## 2022-09-07 DIAGNOSIS — E785 Hyperlipidemia, unspecified: Secondary | ICD-10-CM | POA: Diagnosis present

## 2022-09-07 DIAGNOSIS — Q210 Ventricular septal defect: Secondary | ICD-10-CM | POA: Insufficient documentation

## 2022-09-07 DIAGNOSIS — I25118 Atherosclerotic heart disease of native coronary artery with other forms of angina pectoris: Secondary | ICD-10-CM | POA: Diagnosis present

## 2022-09-07 DIAGNOSIS — I4819 Other persistent atrial fibrillation: Secondary | ICD-10-CM | POA: Insufficient documentation

## 2022-09-07 DIAGNOSIS — Z951 Presence of aortocoronary bypass graft: Secondary | ICD-10-CM | POA: Insufficient documentation

## 2022-09-07 DIAGNOSIS — I10 Essential (primary) hypertension: Secondary | ICD-10-CM | POA: Insufficient documentation

## 2022-09-07 DIAGNOSIS — Z8774 Personal history of (corrected) congenital malformations of heart and circulatory system: Secondary | ICD-10-CM | POA: Insufficient documentation

## 2022-09-07 DIAGNOSIS — I712 Thoracic aortic aneurysm, without rupture, unspecified: Secondary | ICD-10-CM | POA: Diagnosis present

## 2022-09-07 DIAGNOSIS — I350 Nonrheumatic aortic (valve) stenosis: Secondary | ICD-10-CM | POA: Insufficient documentation

## 2022-09-07 DIAGNOSIS — I5032 Chronic diastolic (congestive) heart failure: Secondary | ICD-10-CM | POA: Insufficient documentation

## 2022-09-07 LAB — BASIC METABOLIC PANEL
Anion gap: 11 (ref 5–15)
BUN: 22 mg/dL (ref 8–23)
CO2: 26 mmol/L (ref 22–32)
Calcium: 8.8 mg/dL — ABNORMAL LOW (ref 8.9–10.3)
Chloride: 101 mmol/L (ref 98–111)
Creatinine, Ser: 1.42 mg/dL — ABNORMAL HIGH (ref 0.61–1.24)
GFR, Estimated: 52 mL/min — ABNORMAL LOW (ref >60)
Glucose, Bld: 96 mg/dL (ref 70–99)
Potassium: 4.3 mmol/L (ref 3.5–5.1)
Sodium: 138 mmol/L (ref 135–145)

## 2022-09-08 ENCOUNTER — Telehealth: Payer: Self-pay | Admitting: Physician Assistant

## 2022-09-08 DIAGNOSIS — I5032 Chronic diastolic (congestive) heart failure: Secondary | ICD-10-CM

## 2022-09-08 DIAGNOSIS — Z79899 Other long term (current) drug therapy: Secondary | ICD-10-CM

## 2022-09-08 MED ORDER — FUROSEMIDE 40 MG PO TABS: 20.0000 mg | ORAL_TABLET | Freq: Every day | ORAL | Status: DC

## 2022-09-08 NOTE — Telephone Encounter (Signed)
Sondra Barges, PA-C 09/08/2022  8:24 AM EDT     Labs show potassium at goal Slight increase in renal function, consistent with readings in the spring 2023 Decrease furosemide to 20 mg daily with a follow-up BMP 1 week thereafter to ensure his renal function has returned back to baseline

## 2022-09-08 NOTE — Telephone Encounter (Signed)
I spoke with the patient sister in law, Lawson Fiscal (ok per [facility]) regarding the patient's lab results and recommendations from Eula Listen, PA to: - Decrease furosemide to 20 mg once daily - Repeat a BMP in 1 week ([facility])  Lawson Fiscal voices understanding of these results and recommendations and is agreeable.  I have advised her that the patient can cut the lasix 40 mg tablet in 1/2 and take 1/2 tablet (20 mg) once daily or we can send in a 20 mg tablet to the pharmacy.  Per Lawson Fiscal, she will try to cut the patient's tablets in half and if this is problematic, she will send a message to [place] through MyChart to send in the lower dose.   Lawson Fiscal was appreciative of the call today.  BMP order placed.

## 2022-09-13 ENCOUNTER — Ambulatory Visit: Payer: Medicare Other | Admitting: Medical

## 2022-11-15 ENCOUNTER — Other Ambulatory Visit: Payer: Medicare Other

## 2023-02-26 ENCOUNTER — Ambulatory Visit: Payer: Medicare Other | Admitting: Physician Assistant

## 2023-02-26 NOTE — Progress Notes (Deleted)
Cardiology Office Note    Date:  02/26/2023   ID:  Aaron Jones, DOB [birth year], MRN [id number]  PCP:  Gracelyn Nurse, MD  Cardiologist:  Yvonne Kendall, MD  Electrophysiologist:  Lewayne Bunting, MD   Chief Complaint: ***  History of Present Illness:   Aaron Jones is a 77 y.o. male with history of ***  ***   Labs independently reviewed: 11/2022 -potassium 4.3, BUN 16, serum creatinine 1.2, A1c 6.0 07/2022 - albumin 3.9, AST/ALT normal, TSH normal 05/2022 - Hgb 11.8, PLT 208, 03/2022 - TC 109, TG 79, HDL 32, LDL 61  Past Medical History:  Diagnosis Date   Atrial fibrillation with rapid ventricular response (HCC) 01/16/2013   a. 01/2013 s/p R & L Maze and LAA clipping in setting of VSD repair;  b. Prev on amiodarone;  c. CHA2DS2VASc = 4-->Apixaban;  c. 01/2013 Recurrent afib/flutter requiring DCCV.   CHF (congestive heart failure) (HCC)    Colon polyp    COPD (chronic obstructive pulmonary disease) (HCC)    Coronary artery disease    a. 01/2013 s/p CABG x 1 (VG->PDA) in setting of VSD repair.   GERD (gastroesophageal reflux disease)    Hyperlipidemia    Hypertension    PCP- [facility] [place], phone; [phone]   Mitral valve disorder    Obesity    PAT (paroxysmal atrial tachycardia)    a. 02/2015 s/p DCCV;  b. 12/2016 recurrent PAT - unchanged with escalating BB dose (rates 106-107).   PSVT (paroxysmal supraventricular tachycardia)    a. 10/2011 Admitted to [facility] w/ SVT-->broke with IV dilt.   Recurrent Nephrolithiasis    Schatzki's ring    Sleep apnea    [facility]- in process of being evaluated now, states 12 yrs. ago was on CPAP but after having his deviated septum repaired, he had put the CPAP in storage. Pt. will get a new machine soon.    Thoracic aortic aneurysm (HCC)    a. 01/2016 CTA chest: 4.0 cm Asc Ao; b. 01/2017 Echo: nl Ao root size.   Type II diabetes mellitus (HCC)    Umbilical hernia    "not repaired" (01/16/2013)   VSD (ventricular septal defect) w/  persistent systolic murmur    a. 01/2013 s/p VSD closure (dacron patch);  b. 01/2017 Echo: EF 50-55%, no rwma, mild AS, mild MR, mod dil LA, dilated RV, sev dil RA, mild TR. PASP nl.    Past Surgical History:  Procedure Laterality Date   CARDIAC CATHETERIZATION  > 5 yr ago   Done at [facility], reportedly clean   CARDIAC CATHETERIZATION  11/03/2012   CARDIOVERSION N/A 02/12/2015   Procedure: CARDIOVERSION;  Surgeon: Vesta Mixer, MD;  Location: [facility] ENDOSCOPY;  Service: Cardiovascular;  Laterality: N/A;   CARDIOVERSION N/A 04/27/2017   Procedure: CARDIOVERSION;  Surgeon: Marinus Maw, MD;  Location: [facility] INVASIVE CV LAB;  Service: Cardiovascular;  Laterality: N/A;   CLIPPING OF ATRIAL APPENDAGE N/A 01/07/2013   Procedure: CLIPPING OF ATRIAL APPENDAGE;  Surgeon: Delight Ovens, MD;  Location: [facility] OR;  Service: Open Heart Surgery;  Laterality: N/A;   COLONOSCOPY W/ POLYPECTOMY     COLONOSCOPY WITH PROPOFOL N/A 01/05/2021   Procedure: COLONOSCOPY WITH PROPOFOL;  Surgeon: Toledo, Boykin Nearing, MD;  Location: [facility] ENDOSCOPY;  Service: Gastroenterology;  Laterality: N/A;   CORONARY ARTERY BYPASS GRAFT N/A 01/07/2013   Procedure: CORONARY ARTERY BYPASS GRAFTING (CABG);  Surgeon: Delight Ovens, MD;  Location: [facility] OR;  Service: Open Heart  Surgery;  Laterality: N/A;  Times1 using endoscopically harvested saphenous vein graft to the PDA   ESOPHAGOGASTRODUODENOSCOPY N/A 01/05/2021   Procedure: ESOPHAGOGASTRODUODENOSCOPY (EGD);  Surgeon: Toledo, Boykin Nearing, MD;  Location: [facility] ENDOSCOPY;  Service: Gastroenterology;  Laterality: N/A;  DM   EYE SURGERY Left 02/03/1989   "clipped muscle so it wouldn't be pulling up" (01/16/2013)   INTRAOPERATIVE TRANSESOPHAGEAL ECHOCARDIOGRAM N/A 01/07/2013   Procedure: INTRAOPERATIVE TRANSESOPHAGEAL ECHOCARDIOGRAM;  Surgeon: Delight Ovens, MD;  Location: [facility] OR;  Service: Open Heart Surgery;  Laterality: N/A;   LEFT AND RIGHT HEART CATHETERIZATION WITH CORONARY ANGIOGRAM N/A  11/20/2012   Procedure: LEFT AND RIGHT HEART CATHETERIZATION WITH CORONARY ANGIOGRAM;  Surgeon: Dolores Patty, MD;  Location: [facility] CATH LAB;  Service: Cardiovascular;  Laterality: N/A;   MAZE N/A 01/07/2013   Procedure: MAZE;  Surgeon: Delight Ovens, MD;  Location: [facility] OR;  Service: Open Heart Surgery;  Laterality: N/A;   MULTIPLE EXTRACTIONS WITH ALVEOLOPLASTY N/A 12/11/2012   Procedure: Extraction of tooth #'s 2,3,4,5,14,15,17,21,22,23,24,25,26,27,32 wioth alveoloplasty and bialteral fibrous tuberosity reductions.;  Surgeon: Charlynne Pander, DDS;  Location: [facility] ORS;  Service: Oral Surgery;  Laterality: N/A;   NASAL SEPTUM SURGERY  06/05/2002   RIGHT/LEFT HEART CATH AND CORONARY ANGIOGRAPHY N/A 08/02/2021   Procedure: RIGHT/LEFT HEART CATH AND CORONARY ANGIOGRAPHY;  Surgeon: Yvonne Kendall, MD;  Location: [facility] INVASIVE CV LAB;  Service: Cardiovascular;  Laterality: N/A;   sepfal deveation repair     TEE WITHOUT CARDIOVERSION N/A 11/21/2012   Procedure: TRANSESOPHAGEAL ECHOCARDIOGRAM (TEE);  Surgeon: Dolores Patty, MD;  Location: [facility] ENDOSCOPY;  Service: Cardiovascular;  Laterality: N/A;   VSD REPAIR N/A 01/07/2013   Procedure: VENTRICULAR SEPTAL DEFECT (VSD) REPAIR;  Surgeon: Delight Ovens, MD;  Location: [facility] OR;  Service: Open Heart Surgery;  Laterality: N/A;   VSD REPAIR      Current Medications: No outpatient medications have been marked as taking for the 03/01/23 encounter (Appointment) with Sondra Barges, PA-C.    Allergies:   Biaxin [clarithromycin]   Social History   Socioeconomic History   Marital status: Married    Spouse name: Not on file   Number of children: Not on file   Years of education: Not on file   Highest education level: Not on file  Occupational History   Occupation: Manufacturing    Comment: Heavy work at times  Tobacco Use   Smoking status: Never    Passive exposure: Never   Smokeless tobacco: Never  Vaping Use   Vaping status: Never Used   Substance and Sexual Activity   Alcohol use: No   Drug use: No   Sexual activity: Not Currently  Other Topics Concern   Not on file  Social History Narrative   Still works full time. Works around the yard. Lives with wife. Has 2 sisters, neither with cardiac issues.   Social Determinants of Health   Financial Resource Strain: Not on file  Food Insecurity: Not on file  Transportation Needs: Not on file  Physical Activity: Not on file  Stress: Not on file  Social Connections: Not on file     Family History:  The patient's family history includes Cancer in his mother; Diabetes in his mother.  ROS:   12-point review of systems is negative unless otherwise noted in the HPI.   EKGs/Labs/Other Studies Reviewed:    Studies reviewed were summarized above. The additional studies were reviewed today:  cMRI 08/02/2022: IMPRESSION: 1. Normal LV systolic function.  LVEF 55%.  2. Severe asymmetric LV septal hypertrophy, basal septal wall measuring 20mm in thickness. 3. There is midwall late gadolinium enhancement in the left ventricular myocardium at the RV insertion points. 4. There is a small membraneous ventricular septal defect with left-to-right shunting. Qp:Qs = 1.3 5. Normal RV size and function. 6. 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 ___________   Limited echo 11/25/2021: 1. Left ventricular ejection fraction, by estimation, is 55 to 60%. The  left ventricle has normal function. The left ventricle has no regional  wall motion abnormalities. There is moderate concentric left ventricular  hypertrophy. The average left  ventricular global longitudinal strain is -14.3 %.   2. s/p Dacron Patch VSD repair. There is a moderately sized membranous  ventricular septal defect with left to right shunting, peak gradient 137  mm Hg.   3. Right ventricular systolic function is low normal. The right  ventricular size is normal. Moderately increased  right ventricular wall  thickness. There is mildly elevated pulmonary artery systolic pressure.  The estimated right ventricular systolic  pressure is 36.8 mmHg.   4. Left atrial size was severely dilated.   5. The mitral valve is normal in structure. No evidence of mitral valve  regurgitation. No evidence of mitral stenosis.   6. The aortic valve is normal in structure. Aortic valve regurgitation is  mild. Moderate aortic valve stenosis. Aortic valve area, by VTI measures  1.18 cm. Aortic valve mean gradient measures 19.0 mmHg. Aortic valve Vmax  measures 2.89 m/s.   7. The inferior vena cava is normal in size with greater than 50%  respiratory variability, suggesting right atrial pressure of 3 mmHg.  __________   [facility] 08/02/2021: Conclusions: Moderate-severe but non-critical disease involving ostial D1, ramus intermedius, and rPDA. Widely patent SVG-rPDA. Mildly elevated left heart filling pressures with prominent V-waves noted on PCWP tracing (PCWP 26 mmHg; LVEDP 20-25 mmHg). Severely elevated right heart filling pressures (RAP/RVEDP 16 mmHg). Moderate pulmonary hypertension (mean PAP 40 mmHg; PVR 2.8 WU). Low normal to mildly reduced Fick cardiac output/index (CO 4.9 L/min, CI 2.3 L/min/m). No significant intracardiac shunting (QPS 1.0-1.2).   Recommendations: Escalate diuresis: We will increase furosemide to 40 mg twice daily. Add isosorbide mononitrate 15 mg daily for antianginal therapy. Increased goal-directed medical therapy for nonischemic cardiomyopathy as an outpatient, as tolerated. Continue clinical and echocardiographic monitoring of VSD; shunting does not appear to be significant at this time. Consider referral to advanced heart failure clinic if symptoms persist. Restart apixaban tomorrow morning if no evidence of bleeding/vascular injury. __________   Luci Bank patch 04/2021: HR 52 - 231, average 77 bpm. No atrial fibrillation detected. 47 SVT, longest lasting 19  beats. Occasional ventricular ectopy, 3.7%. Rare supraventricular ectopy, 4.1%. No sustained arrhythmias. __________   2D echo 12/28/2020: 1. Rhythm appears to be atrial flutter - no A waves noted.   2. Prior dacron patch VSD repair is compromised with left to right  shunting noted at the membranous septum (images 35, 45) - peak gradient is  122 mmHg (systolic BP). The neck of the fenestration measures about 0.5  cm. Left ventricular ejection fraction,   by estimation, is 45 to 50%. The left ventricle has mildly decreased  function. The left ventricle demonstrates global hypokinesis. There is  moderate left ventricular hypertrophy. Left ventricular diastolic function  could not be evaluated.   3. Right ventricular systolic function is normal. The right ventricular  size is normal. There is mildly elevated pulmonary artery systolic  pressure. The estimated right  ventricular systolic pressure is 39.8 mmHg.   4. Left atrial size was severely dilated.   5. Right atrial size was moderately dilated.   6. The mitral valve is abnormal. Trivial mitral valve regurgitation.  Moderate mitral annular calcification.   7. Tricuspid valve regurgitation is moderate.   8. The aortic valve is abnormal. Aortic valve regurgitation is trivial.   9. Aortic dilatation noted. There is borderline dilatation of the  ascending aorta, measuring 38 mm.  10. The inferior vena cava is normal in size with <50% respiratory  variability, suggesting right atrial pressure of 8 mmHg.   Comparison(s): Changes from prior study are noted. 06/14/2018: LVEF  55-60%,.  __________   2D echo 06/14/2018: - Left ventricle: The cavity size was normal. There was moderate    concentric hypertrophy. Systolic function was normal. The    estimated ejection fraction was in the range of 55% to 60%. Wall    motion was normal; there were no regional wall motion    abnormalities. Features are consistent with a pseudonormal left     ventricular filling pattern, with concomitant abnormal relaxation    and increased filling pressure (grade 2 diastolic dysfunction).  - Aortic valve: Valve mobility was restricted. There was mild    stenosis. There was moderate regurgitation. Peak velocity (S):    233 cm/s. Mean gradient (S): 13 mm Hg. Peak gradient (S): 22 mm    Hg.  - Mitral valve: Transvalvular velocity was within the normal range.    There was no evidence for stenosis. There was moderate    regurgitation.  - Left atrium: The atrium was severely dilated.  - Right ventricle: The cavity size was mildly dilated. Wall    thickness was normal. Systolic function was normal.  - Atrial septum: A patent foramen ovale cannot be excluded.  - Tricuspid valve: There was mild regurgitation.  - Pulmonary arteries: Systolic pressure was mildly increased. PA    peak pressure: 41 mm Hg (S).  __________   See CV Studies in Epic for more remote imaging   EKG:  EKG is ordered today.  The EKG ordered today demonstrates ***  Recent Labs: 05/31/2022: B Natriuretic Peptide 247.1; Hemoglobin 11.8; Platelets 208 07/26/2022: ALT 12; TSH 2.009 09/07/2022: BUN 22; Creatinine, Ser 1.42; Potassium 4.3; Sodium 138  Recent Lipid Panel    Component Value Date/Time   CHOL 142 04/17/2013 0833   CHOL 192 10/29/2011 0429   TRIG 125 04/17/2013 0833   TRIG 150 10/29/2011 0429   HDL 41 04/17/2013 0833   HDL 32 (L) 10/29/2011 0429   CHOLHDL 3.5 04/17/2013 0833   VLDL 30 10/29/2011 0429   LDLCALC 76 04/17/2013 0833   LDLCALC 130 (H) 10/29/2011 0429    PHYSICAL EXAM:    VS:  There were no vitals taken for this visit.  BMI: There is no height or weight on file to calculate BMI.  Physical Exam  Wt Readings from Last 3 Encounters:  08/25/22 209 lb 12.8 oz (95.2 kg)  07/05/22 213 lb (96.6 kg)  06/20/22 214 lb (97.1 kg)     ASSESSMENT & PLAN:   ***   {Are you ordering a CV Procedure (e.g. stress test, cath, DCCV, TEE, etc)?   Press F2         :[id number]}     Disposition: F/u with Dr. Okey Dupre or an APP in ***.   Medication Adjustments/Labs and Tests Ordered: Current medicines are reviewed at length with the patient today.  Concerns regarding  medicines are outlined above. Medication changes, Labs and Tests ordered today are summarized above and listed in the Patient Instructions accessible in Encounters.   Elinor Dodge, PA-C 02/26/2023 3:09 PM      [facility] [address] [place], [place] [address] [phone]

## 2023-03-01 ENCOUNTER — Ambulatory Visit: Payer: Medicare Other | Admitting: Physician Assistant

## 2023-03-20 NOTE — Progress Notes (Unsigned)
Cardiology Office Note    Date:  03/21/2023   ID:  ROYE GUSTAFSON, DOB 12-Aug-1945, MRN [id number]  PCP:  Gracelyn Nurse, MD  Cardiologist:  Yvonne Kendall, MD  Electrophysiologist:  Lewayne Bunting, MD   Chief Complaint: Follow up  History of Present Illness:   Aaron Jones is a 77 y.o. male with history of CAD status post single-vessel CABG with SVG to PDA, VSD status post open repair with Dacron patch in 2014, persistent A-fib status post Maze and left atrial appendage clipping during CABG, HCM, moderate aortic stenosis, PAT, HTN, HLD, DM2, and OSA not on CPAP who presents for follow-up of CAD, Afib, HCM, and aortic stenosis.   CTA of the chest/aorta in 2021 showed unchanged enlargement of the sinuses of Valsalva measuring up to 4.3 cm, normal caliber ascending thoracic aorta measuring up to 3.5 x 3.5 cm, unchanged enlargement of the proximal descending thoracic aorta measuring up to 3.1 x 3.0 cm.   Echo from 12/2020 demonstrated an EF of 45 to 50%, global hypokinesis, moderate LVH, prior dacryon patch VSD repair compromised with left to right shunting noted at the membranous septum with a peak gradient of 122 mmHg, normal RV systolic function and ventricular cavity size, mildly elevated PASP estimated at 39.8 mmHg, severely dilated left atrium, moderately dilated right atrium, trivial mitral regurgitation with moderate mitral annular calcification, moderate tricuspid regurgitation, trivial aortic insufficiency, borderline dilatation of the ascending aorta measuring 38 mm, and a rhythm that appeared to be atrial flutter.   Zio patch from 04/2021 showed a predominant rhythm of sinus with no evidence of A-fib with an average rate of 77 bpm, 47 episodes of SVT with the longest interval lasting 19 beats, occasional PVCs representing 3.7%, and a PAC burden of 4.1%.   R/LHC from 07/2021 showed moderate to severe, but noncritical disease involving the ostial D1, ramus intermedius, and RPDA with  widely patent SVG to RPDA.  Mildly elevated left heart filling pressures with prominent V waves noted on PCWP tracing, severely elevated right heart filling pressures, moderate pulmonary hypertension, low normal to mildly reduced cardiac output/index, and no significant intracardiac shunting.   Echo in 11/2021 demonstrated an EF of 55 to 60%, no regional wall motion abnormalities, moderate concentric LVH, status post dacryon patch VSD repair with moderately sized membranous VSD with left-to-right shunting with a peak gradient of 137 mmHg, low normal RV systolic function, normal ventricular cavity size, moderately increased RV wall thickness, mildly elevated PASP estimated at 36.8 mmHg, severely dilated left atrium, mild aortic insufficiency, moderate aortic stenosis with a mean gradient of 19 mmHg and a valve area by VTI of 1.18 cm, and an estimated right atrial pressure of 3 mmHg.   Historically, he has reported daytime somnolence as he was staying up at night to watch his wife with MS, and was sleeping during the day.  He has been intolerant to CPAP mask with known OSA.   He was seen in the ED on 05/31/2022 with chest pain, dyspnea, and orthopnea.  Initial high-sensitivity troponin 17 with a delta of troponin 19.  BNP 247.  Chest x-ray showed cardiomegaly with pulmonary vascular congestion.  He was given IV Lasix with symptomatic improvement.   I saw him in 06/2022, at which time he continued to note baseline fatigue with poor sleep hygiene.  Given his history of VSD status post repair with concern for shunting, he was referred to the adult congenital heart clinic at [facility] to establish care and  for ongoing management.   Cardiac MRI on 08/02/2022 showed an EF of 55%, severe asymmetric LV septal hypertrophy with basal septal wall measuring 20 mm, mid-wall LGE in the LV myocardium at the RV insertion points, small membranous VSD with left-to-right shunting with Qp:Qs 1.3, normal RV systolic function and size.   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   He was last seen in our was in 08/2022 and was without symptoms of angina or cardiac decompensation.  He indicated he would not want to pursue invasive repair of VSD given his wife's health.  He was evaluated by [facility] adult congenital clinic in 10/2022 with recommendation to obtain echo at their office in 6 months.  He comes in doing well from a cardiac perspective and is without symptoms of angina or cardiac decompensation.  No dyspnea, palpitations, dizziness, presyncope, or syncope.  No lower extremity swelling or progressive orthopnea.  No falls or symptoms concerning for bleeding.  He does notice a white spot in his field of vision involving the left eye.  He reports he is going to see ophthalmology for this.  Currently fasting with plans to have blood work drawn for PCP following today's appointment.  Will be getting follow-up echo at [facility] in 05/2023.  Adherent and tolerating anticoagulation.  Overall, feels well and does not have any acute cardiac concerns.   Labs independently reviewed: 11/2022 -potassium 4.3, BUN 16, serum creatinine 1.2, A1c 6.0 07/2022 - albumin 3.9, AST/ALT normal, TSH normal 05/2022 - Hgb 11.8, PLT 208, 03/2022 - TC 109, TG 79, HDL 32, LDL 61  Past Medical History:  Diagnosis Date   Atrial fibrillation with rapid ventricular response (HCC) 01/16/2013   a. 01/2013 s/p R & L Maze and LAA clipping in setting of VSD repair;  b. Prev on amiodarone;  c. CHA2DS2VASc = 4-->Apixaban;  c. 01/2013 Recurrent afib/flutter requiring DCCV.   CHF (congestive heart failure) (HCC)    Colon polyp    COPD (chronic obstructive pulmonary disease) (HCC)    Coronary artery disease    a. 01/2013 s/p CABG x 1 (VG->PDA) in setting of VSD repair.   GERD (gastroesophageal reflux disease)    Hyperlipidemia    Hypertension    PCP- Orson Aloe - [place], phone; [phone]   Mitral valve disorder    Obesity    PAT (paroxysmal  atrial tachycardia) (HCC)    a. 02/2015 s/p DCCV;  b. 12/2016 recurrent PAT - unchanged with escalating BB dose (rates 106-107).   PSVT (paroxysmal supraventricular tachycardia) (HCC)    a. 10/2011 Admitted to [facility] w/ SVT-->broke with IV dilt.   Recurrent Nephrolithiasis    Schatzki's ring    Sleep apnea    [facility]- in process of being evaluated now, states 12 yrs. ago was on CPAP but after having his deviated septum repaired, he had put the CPAP in storage. Pt. will get a new machine soon.    Thoracic aortic aneurysm (HCC)    a. 01/2016 CTA chest: 4.0 cm Asc Ao; b. 01/2017 Echo: nl Ao root size.   Type II diabetes mellitus (HCC)    Umbilical hernia    "not repaired" (01/16/2013)   VSD (ventricular septal defect) w/ persistent systolic murmur    a. 01/2013 s/p VSD closure (dacron patch);  b. 01/2017 Echo: EF 50-55%, no rwma, mild AS, mild MR, mod dil LA, dilated RV, sev dil RA, mild TR. PASP nl.    Past Surgical History:  Procedure Laterality Date   CARDIAC CATHETERIZATION  >  5 yr ago   Done at [employer], reportedly clean   CARDIAC CATHETERIZATION  11/03/2012   CARDIOVERSION N/A 02/12/2015   Procedure: CARDIOVERSION;  Surgeon: Vesta Mixer, MD;  Location: [facility] ENDOSCOPY;  Service: Cardiovascular;  Laterality: N/A;   CARDIOVERSION N/A 04/27/2017   Procedure: CARDIOVERSION;  Surgeon: Marinus Maw, MD;  Location: [facility] INVASIVE CV LAB;  Service: Cardiovascular;  Laterality: N/A;   CLIPPING OF ATRIAL APPENDAGE N/A 01/07/2013   Procedure: CLIPPING OF ATRIAL APPENDAGE;  Surgeon: Delight Ovens, MD;  Location: [facility] OR;  Service: Open Heart Surgery;  Laterality: N/A;   COLONOSCOPY W/ POLYPECTOMY     COLONOSCOPY WITH PROPOFOL N/A 01/05/2021   Procedure: COLONOSCOPY WITH PROPOFOL;  Surgeon: Toledo, Boykin Nearing, MD;  Location: [facility] ENDOSCOPY;  Service: Gastroenterology;  Laterality: N/A;   CORONARY ARTERY BYPASS GRAFT N/A 01/07/2013   Procedure: CORONARY ARTERY BYPASS GRAFTING (CABG);  Surgeon: Delight Ovens, MD;  Location: [facility] OR;  Service: Open Heart Surgery;  Laterality: N/A;  Times1 using endoscopically harvested saphenous vein graft to the PDA   ESOPHAGOGASTRODUODENOSCOPY N/A 01/05/2021   Procedure: ESOPHAGOGASTRODUODENOSCOPY (EGD);  Surgeon: Toledo, Boykin Nearing, MD;  Location: [facility] ENDOSCOPY;  Service: Gastroenterology;  Laterality: N/A;  DM   EYE SURGERY Left 02/03/1989   "clipped muscle so it wouldn't be pulling up" (01/16/2013)   INTRAOPERATIVE TRANSESOPHAGEAL ECHOCARDIOGRAM N/A 01/07/2013   Procedure: INTRAOPERATIVE TRANSESOPHAGEAL ECHOCARDIOGRAM;  Surgeon: Delight Ovens, MD;  Location: [facility] OR;  Service: Open Heart Surgery;  Laterality: N/A;   LEFT AND RIGHT HEART CATHETERIZATION WITH CORONARY ANGIOGRAM N/A 11/20/2012   Procedure: LEFT AND RIGHT HEART CATHETERIZATION WITH CORONARY ANGIOGRAM;  Surgeon: Dolores Patty, MD;  Location: [facility] CATH LAB;  Service: Cardiovascular;  Laterality: N/A;   MAZE N/A 01/07/2013   Procedure: MAZE;  Surgeon: Delight Ovens, MD;  Location: [facility] OR;  Service: Open Heart Surgery;  Laterality: N/A;   MULTIPLE EXTRACTIONS WITH ALVEOLOPLASTY N/A 12/11/2012   Procedure: Extraction of tooth #'s 2,3,4,5,14,15,17,21,22,23,24,25,26,27,32 wioth alveoloplasty and bialteral fibrous tuberosity reductions.;  Surgeon: Charlynne Pander, DDS;  Location: [facility] ORS;  Service: Oral Surgery;  Laterality: N/A;   NASAL SEPTUM SURGERY  06/05/2002   RIGHT/LEFT HEART CATH AND CORONARY ANGIOGRAPHY N/A 08/02/2021   Procedure: RIGHT/LEFT HEART CATH AND CORONARY ANGIOGRAPHY;  Surgeon: Yvonne Kendall, MD;  Location: [facility] INVASIVE CV LAB;  Service: Cardiovascular;  Laterality: N/A;   sepfal deveation repair     TEE WITHOUT CARDIOVERSION N/A 11/21/2012   Procedure: TRANSESOPHAGEAL ECHOCARDIOGRAM (TEE);  Surgeon: Dolores Patty, MD;  Location: [facility] ENDOSCOPY;  Service: Cardiovascular;  Laterality: N/A;   VSD REPAIR N/A 01/07/2013   Procedure: VENTRICULAR SEPTAL DEFECT (VSD) REPAIR;   Surgeon: Delight Ovens, MD;  Location: [facility] OR;  Service: Open Heart Surgery;  Laterality: N/A;   VSD REPAIR      Current Medications: Current Meds  Medication Sig   acetaminophen (TYLENOL) 500 MG tablet Take 1,000 mg by mouth daily as needed for moderate pain or headache.   amiodarone (PACERONE) 200 MG tablet Take 1 tablet (200 mg total) by mouth daily.   apixaban (ELIQUIS) 5 MG TABS tablet Take 1 tablet (5 mg total) by mouth 2 (two) times daily.   atorvastatin (LIPITOR) 20 MG tablet Take 1 tablet (20 mg total) by mouth at bedtime.   furosemide (LASIX) 40 MG tablet Take 0.5 tablets (20 mg total) by mouth daily.   glipiZIDE (GLUCOTROL) 10 MG tablet Take 5-10 mg by mouth See admin instructions. 10mg  in  the am and 5mg  in the pm   Glucose Blood (BLOOD GLUCOSE TEST STRIPS) STRP 1 strip by In Vitro route 2 (two) times daily.   isosorbide mononitrate (IMDUR) 30 MG 24 hr tablet Take 0.5 tablets (15 mg total) by mouth daily.   losartan (COZAAR) 100 MG tablet Take 1 tablet (100 mg total) by mouth daily.   metFORMIN (GLUCOPHAGE) 1000 MG tablet Take 1,000 mg by mouth 2 (two) times daily.   metoprolol succinate (TOPROL-XL) 50 MG 24 hr tablet Take 1.5 tablets (75 mg) by mouth once daily. Take with or immediately following a meal. (Patient taking differently: 100 mg daily. Take 1.5 tablets (75 mg) by mouth once daily. Take with or immediately following a meal.)   omeprazole (PRILOSEC) 40 MG capsule Take 40 mg by mouth daily before breakfast.    pioglitazone (ACTOS) 30 MG tablet Take 30 mg by mouth daily.   sertraline (ZOLOFT) 50 MG tablet Take 50 mg by mouth daily.   traMADol (ULTRAM) 50 MG tablet Take One tab PO Q6 hours PRN pain    Allergies:   Biaxin [clarithromycin]   Social History   Socioeconomic History   Marital status: Married    Spouse name: Not on file   Number of children: Not on file   Years of education: Not on file   Highest education level: Not on file  Occupational History    Occupation: Manufacturing    Comment: Heavy work at times  Tobacco Use   Smoking status: Never    Passive exposure: Never   Smokeless tobacco: Never  Vaping Use   Vaping status: Never Used  Substance and Sexual Activity   Alcohol use: No   Drug use: No   Sexual activity: Not Currently  Other Topics Concern   Not on file  Social History Narrative   Still works full time. Works around the yard. Lives with wife. Has 2 sisters, neither with cardiac issues.   Social Determinants of Health   Financial Resource Strain: Not on file  Food Insecurity: Not on file  Transportation Needs: Not on file  Physical Activity: Not on file  Stress: Not on file  Social Connections: Not on file     Family History:  The patient's family history includes Cancer in his mother; Diabetes in his mother.  ROS:   12-point review of systems is negative unless otherwise noted in the HPI.   EKGs/Labs/Other Studies Reviewed:    Studies reviewed were summarized above. The additional studies were reviewed today:  cMRI 08/02/2022: IMPRESSION: 1. Normal LV systolic function.  LVEF 55%. 2. Severe asymmetric LV septal hypertrophy, basal septal wall measuring 20mm in thickness. 3. There is midwall late gadolinium enhancement in the left ventricular myocardium at the RV insertion points. 4. There is a small membraneous ventricular septal defect with left-to-right shunting. Qp:Qs = 1.3 5. Normal RV size and function. 6. 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 ___________   Limited echo 11/25/2021: 1. Left ventricular ejection fraction, by estimation, is 55 to 60%. The  left ventricle has normal function. The left ventricle has no regional  wall motion abnormalities. There is moderate concentric left ventricular  hypertrophy. The average left  ventricular global longitudinal strain is -14.3 %.   2. s/p Dacron Patch VSD repair. There is a moderately sized membranous   ventricular septal defect with left to right shunting, peak gradient 137  mm Hg.   3. Right ventricular systolic function is low normal. The right  ventricular size is  normal. Moderately increased right ventricular wall  thickness. There is mildly elevated pulmonary artery systolic pressure.  The estimated right ventricular systolic  pressure is 36.8 mmHg.   4. Left atrial size was severely dilated.   5. The mitral valve is normal in structure. No evidence of mitral valve  regurgitation. No evidence of mitral stenosis.   6. The aortic valve is normal in structure. Aortic valve regurgitation is  mild. Moderate aortic valve stenosis. Aortic valve area, by VTI measures  1.18 cm. Aortic valve mean gradient measures 19.0 mmHg. Aortic valve Vmax  measures 2.89 m/s.   7. The inferior vena cava is normal in size with greater than 50%  respiratory variability, suggesting right atrial pressure of 3 mmHg.  __________   [facility] 08/02/2021: Conclusions: Moderate-severe but non-critical disease involving ostial D1, ramus intermedius, and rPDA. Widely patent SVG-rPDA. Mildly elevated left heart filling pressures with prominent V-waves noted on PCWP tracing (PCWP 26 mmHg; LVEDP 20-25 mmHg). Severely elevated right heart filling pressures (RAP/RVEDP 16 mmHg). Moderate pulmonary hypertension (mean PAP 40 mmHg; PVR 2.8 WU). Low normal to mildly reduced Fick cardiac output/index (CO 4.9 L/min, CI 2.3 L/min/m). No significant intracardiac shunting (QPS 1.0-1.2).   Recommendations: Escalate diuresis: We will increase furosemide to 40 mg twice daily. Add isosorbide mononitrate 15 mg daily for antianginal therapy. Increased goal-directed medical therapy for nonischemic cardiomyopathy as an outpatient, as tolerated. Continue clinical and echocardiographic monitoring of VSD; shunting does not appear to be significant at this time. Consider referral to advanced heart failure clinic if symptoms  persist. Restart apixaban tomorrow morning if no evidence of bleeding/vascular injury. __________   [place] patch 04/2021: HR 52 - 231, average 77 bpm. No atrial fibrillation detected. 47 SVT, longest lasting 19 beats. Occasional ventricular ectopy, 3.7%. Rare supraventricular ectopy, 4.1%. No sustained arrhythmias. __________   2D echo 12/28/2020: 1. Rhythm appears to be atrial flutter - no A waves noted.   2. Prior dacron patch VSD repair is compromised with left to right  shunting noted at the membranous septum (images 35, 45) - peak gradient is  122 mmHg (systolic BP). The neck of the fenestration measures about 0.5  cm. Left ventricular ejection fraction,   by estimation, is 45 to 50%. The left ventricle has mildly decreased  function. The left ventricle demonstrates global hypokinesis. There is  moderate left ventricular hypertrophy. Left ventricular diastolic function  could not be evaluated.   3. Right ventricular systolic function is normal. The right ventricular  size is normal. There is mildly elevated pulmonary artery systolic  pressure. The estimated right ventricular systolic pressure is 39.8 mmHg.   4. Left atrial size was severely dilated.   5. Right atrial size was moderately dilated.   6. The mitral valve is abnormal. Trivial mitral valve regurgitation.  Moderate mitral annular calcification.   7. Tricuspid valve regurgitation is moderate.   8. The aortic valve is abnormal. Aortic valve regurgitation is trivial.   9. Aortic dilatation noted. There is borderline dilatation of the  ascending aorta, measuring 38 mm.  10. The inferior vena cava is normal in size with <50% respiratory  variability, suggesting right atrial pressure of 8 mmHg.   Comparison(s): Changes from prior study are noted. 06/14/2018: LVEF  55-60%,.  __________   2D echo 06/14/2018: - Left ventricle: The cavity size was normal. There was moderate    concentric hypertrophy. Systolic function was  normal. The    estimated ejection fraction was in the range of 55% to  60%. Wall    motion was normal; there were no regional wall motion    abnormalities. Features are consistent with a pseudonormal left    ventricular filling pattern, with concomitant abnormal relaxation    and increased filling pressure (grade 2 diastolic dysfunction).  - Aortic valve: Valve mobility was restricted. There was mild    stenosis. There was moderate regurgitation. Peak velocity (S):    233 cm/s. Mean gradient (S): 13 mm Hg. Peak gradient (S): 22 mm    Hg.  - Mitral valve: Transvalvular velocity was within the normal range.    There was no evidence for stenosis. There was moderate    regurgitation.  - Left atrium: The atrium was severely dilated.  - Right ventricle: The cavity size was mildly dilated. Wall    thickness was normal. Systolic function was normal.  - Atrial septum: A patent foramen ovale cannot be excluded.  - Tricuspid valve: There was mild regurgitation.  - Pulmonary arteries: Systolic pressure was mildly increased. PA    peak pressure: 41 mm Hg (S).  __________   See CV Studies in Epic for more remote imaging   EKG:  EKG is ordered today.  The EKG ordered today demonstrates NSR, 81 bpm, bifascicular block (RBBB, LAFB), consistent with prior tracings  Recent Labs: 05/31/2022: B Natriuretic Peptide 247.1; Hemoglobin 11.8; Platelets 208 07/26/2022: ALT 12; TSH 2.009 09/07/2022: BUN 22; Creatinine, Ser 1.42; Potassium 4.3; Sodium 138  Recent Lipid Panel    Component Value Date/Time   CHOL 142 04/17/2013 0833   CHOL 192 10/29/2011 0429   TRIG 125 04/17/2013 0833   TRIG 150 10/29/2011 0429   HDL 41 04/17/2013 0833   HDL 32 (L) 10/29/2011 0429   CHOLHDL 3.5 04/17/2013 0833   VLDL 30 10/29/2011 0429   LDLCALC 76 04/17/2013 0833   LDLCALC 130 (H) 10/29/2011 0429    PHYSICAL EXAM:    VS:  BP 110/60 (BP Location: Left Arm, Patient Position: Sitting, Cuff Size: Large)   Pulse 80   Ht  6' (1.829 m)   Wt 208 lb 8 oz (94.6 kg)   SpO2 98%   BMI 28.28 kg/m   BMI: Body mass index is 28.28 kg/m.  Physical Exam Vitals reviewed.  Constitutional:      Appearance: He is well-developed.  HENT:     Head: Normocephalic and atraumatic.  Eyes:     General:        Right eye: No discharge.        Left eye: No discharge.  Neck:     Vascular: No JVD.  Cardiovascular:     Rate and Rhythm: Normal rate and regular rhythm.     Pulses:          Posterior tibial pulses are 2+ on the right side and 2+ on the left side.     Heart sounds: S1 normal and S2 normal. Heart sounds not distant. No midsystolic click and no opening snap. Murmur heard.     Harsh midsystolic murmur is present with a grade of 2/6 at the upper right sternal border radiating to the neck.     Systolic murmur of grade 2/6 is also present at the lower left sternal border.     No friction rub.  Pulmonary:     Effort: Pulmonary effort is normal. No respiratory distress.     Breath sounds: Normal breath sounds. No decreased breath sounds, wheezing, rhonchi or rales.  Chest:     Chest wall: No  tenderness.  Abdominal:     General: There is no distension.  Musculoskeletal:     Cervical back: Normal range of motion.     Right lower leg: No edema.     Left lower leg: No edema.  Skin:    General: Skin is warm and dry.     Nails: There is no clubbing.  Neurological:     Mental Status: He is alert and oriented to person, place, and time.  Psychiatric:        Speech: Speech normal.        Behavior: Behavior normal.        Thought Content: Thought content normal.        Judgment: Judgment normal.     Wt Readings from Last 3 Encounters:  03/21/23 208 lb 8 oz (94.6 kg)  08/25/22 209 lb 12.8 oz (95.2 kg)  07/05/22 213 lb (96.6 kg)     ASSESSMENT & PLAN:   CAD status post CABG without angina: He is doing well and without symptoms of angina or cardiac decompensation.  He remains on apixaban denies of aspirin given  underlying persistent A-fib to minimize bleeding risk.  Continue aggressive risk modification and secondary prevention including atorvastatin, Imdur, and Toprol-XL.  No indication for further ischemic testing at this time.  HFimpEF/HCM: Most recent echo from 11/2021 showed improvement in LV systolic function with an EF of 55 to 60%.  Continue current GDMT including losartan and Toprol-XL.  Defer further escalation of GDMT at this time given asymptomatic presentation and in the setting of normalization of LV systolic function.  Should he have recurrence of symptoms, would look to transition ARB to ARNI with potential addition of MRA and SGLT2 inhibitor.  VSD status post Dacron repair: Previously noted to have dacryon graft compromise with a left to right shunt a peak gradient of 122 mmHg with normal RV cavity size by echo in 12/2020. R/LHC with shunt run in 07/2021 showed VSD shunting did not appear to be significant at that time. Most recent echo from 11/2021 again a moderately sized membranous VSD with left-to-right shunting with a peak gradient of 137 mmHg.  Cardiac MRI showed Qp: Qs 1.3.  He will have follow-up echo with [facility] in 05/2023.  Has dentures, no longer sees DDS.  Persistent A-fib: Maintaining sinus rhythm on metoprolol succinate and amiodarone 200 mg daily.  CHA2DS2-VASc at least 5.  Remains on apixaban 5 mg twice daily and does not meet reduced dosing criteria.  Follow-up labs will be obtained at PCPs office later this afternoon.  No falls or symptoms concerning for bleeding.  Surveillance amiodarone labs will be obtained through PCPs office this afternoon.  The patient was also advised to keep follow-up with ophthalmology regarding visual field defect of the left eye.  Aortic stenosis: Moderate by echo in 11/2021.  This will be followed by echo at [facility] in 05/2023.  Thoracic aortic ectasia: CTA chest/aorta in 06/2022 showed stable ectasia of the aortic root measuring 4.2 cm with no evidence of  aneurysmal dilatation of the thoracic aorta and no specific imaging follow-up recommended.   HTN: Blood pressure is well-controlled in the office today.  He remains on Imdur, losartan, and Toprol-XL.  HLD: LDL 61 in 03/2022.  Remains on atorvastatin 20 mg.  Fasting labs will be obtained through PCPs office later today.   Disposition: F/u with Dr. Okey Dupre or an APP in 5 months.   Medication Adjustments/Labs and Tests Ordered: Current medicines are reviewed at length with  the patient today.  Concerns regarding medicines are outlined above. Medication changes, Labs and Tests ordered today are summarized above and listed in the Patient Instructions accessible in Encounters.   Signed, Eula Listen, PA-C 03/21/2023 3:10 PM     [facility] [place] [address] [place], [place] [address] [phone]

## 2023-03-21 ENCOUNTER — Encounter: Payer: Self-pay | Admitting: Physician Assistant

## 2023-03-21 ENCOUNTER — Ambulatory Visit: Payer: Medicare Other | Attending: Physician Assistant | Admitting: Physician Assistant

## 2023-03-21 VITALS — BP 110/60 | HR 80 | Ht 72.0 in | Wt 208.5 lb

## 2023-03-21 DIAGNOSIS — Q210 Ventricular septal defect: Secondary | ICD-10-CM | POA: Diagnosis present

## 2023-03-21 DIAGNOSIS — E785 Hyperlipidemia, unspecified: Secondary | ICD-10-CM | POA: Insufficient documentation

## 2023-03-21 DIAGNOSIS — Z951 Presence of aortocoronary bypass graft: Secondary | ICD-10-CM | POA: Diagnosis not present

## 2023-03-21 DIAGNOSIS — I25118 Atherosclerotic heart disease of native coronary artery with other forms of angina pectoris: Secondary | ICD-10-CM | POA: Diagnosis not present

## 2023-03-21 DIAGNOSIS — I4819 Other persistent atrial fibrillation: Secondary | ICD-10-CM | POA: Insufficient documentation

## 2023-03-21 DIAGNOSIS — Z8679 Personal history of other diseases of the circulatory system: Secondary | ICD-10-CM | POA: Diagnosis not present

## 2023-03-21 DIAGNOSIS — I10 Essential (primary) hypertension: Secondary | ICD-10-CM | POA: Insufficient documentation

## 2023-03-21 DIAGNOSIS — I77810 Thoracic aortic ectasia: Secondary | ICD-10-CM | POA: Diagnosis present

## 2023-03-21 DIAGNOSIS — I5032 Chronic diastolic (congestive) heart failure: Secondary | ICD-10-CM | POA: Diagnosis not present

## 2023-03-21 NOTE — Patient Instructions (Signed)
Medication Instructions:   Your physician recommends that you continue on your current medications as directed. Please refer to the Current Medication list given to you today.  *If you need a refill on your cardiac medications before your next appointment, please call your pharmacy*   Lab Work:  NONE  If you have labs (blood work) drawn today and your tests are completely normal, you will receive your results only by: MyChart Message (if you have MyChart) OR A paper copy in the mail If you have any lab test that is abnormal or we need to change your treatment, we will call you to review the results.   Testing/Procedures:  NONE   Follow-Up: At [facility], you and your health needs are our priority.  As part of our continuing mission to provide you with exceptional heart care, we have created designated Provider Care Teams.  These Care Teams include your primary Cardiologist (physician) and Advanced Practice Providers (APPs -  Physician Assistants and Nurse Practitioners) who all work together to provide you with the care you need, when you need it.  We recommend signing up for the patient portal called "MyChart".  Sign up information is provided on this After Visit Summary.  MyChart is used to connect with patients for Virtual Visits (Telemedicine).  Patients are able to view lab/test results, encounter notes, upcoming appointments, etc.  Non-urgent messages can be sent to your provider as well.   To learn more about what you can do with MyChart, go to [email or web address].    Your next appointment:   5 month(s)  Provider:   You may see Yvonne Kendall, MD or one of the following Advanced Practice Providers on your designated Care Team:   Nicolasa Ducking, NP Eula Listen, PA-C Cadence Fransico Michael, PA-C Charlsie Quest, NP

## 2023-04-06 MED ORDER — METOPROLOL SUCCINATE ER 100 MG PO TB24: 100.0000 mg | ORAL_TABLET | Freq: Every day | ORAL | 3 refills | Status: DC

## 2023-08-19 NOTE — Progress Notes (Unsigned)
 Cardiology Office Note    Date:  08/21/2023   ID:  Aaron Jones, DOB Dec 23, 1945, MRN [id number]  PCP:  Gracelyn Nurse, MD  Cardiologist:  Yvonne Kendall, MD  Electrophysiologist:  Lewayne Bunting, MD   Chief Complaint: Follow-up  History of Present Illness:   Aaron Jones is a 78 y.o. male with history of CAD status post single-vessel CABG with SVG to PDA, VSD status post open repair with Dacron patch in 2014, persistent A-fib status post Maze and left atrial appendage clipping during CABG, LV septal hypertrophy, moderate aortic stenosis, moderate to severe mitral regurgitation, PAT, HTN, HLD, DM2, and OSA not on CPAP who presents for follow-up of CAD, A-fib, and valvular heart disease.  CTA of the chest/aorta in 2021 showed unchanged enlargement of the sinuses of Valsalva measuring up to 4.3 cm, normal caliber ascending thoracic aorta measuring up to 3.5 x 3.5 cm, unchanged enlargement of the proximal descending thoracic aorta measuring up to 3.1 x 3.0 cm.   Echo from 12/2020 demonstrated an EF of 45 to 50%, global hypokinesis, moderate LVH, prior dacryon patch VSD repair compromised with left to right shunting noted at the membranous septum with a peak gradient of 122 mmHg, normal RV systolic function and ventricular cavity size, mildly elevated PASP estimated at 39.8 mmHg, severely dilated left atrium, moderately dilated right atrium, trivial mitral regurgitation with moderate mitral annular calcification, moderate tricuspid regurgitation, trivial aortic insufficiency, borderline dilatation of the ascending aorta measuring 38 mm, and a rhythm that appeared to be atrial flutter.   Zio patch from 04/2021 showed a predominant rhythm of sinus with no evidence of A-fib with an average rate of 77 bpm, 47 episodes of SVT with the longest interval lasting 19 beats, occasional PVCs representing 3.7%, and a PAC burden of 4.1%.   R/LHC from 07/2021 showed moderate to severe, but noncritical  disease involving the ostial D1, ramus intermedius, and RPDA with widely patent SVG to RPDA.  Mildly elevated left heart filling pressures with prominent V waves noted on PCWP tracing, severely elevated right heart filling pressures, moderate pulmonary hypertension, low normal to mildly reduced cardiac output/index, and no significant intracardiac shunting.   Echo in 11/2021 demonstrated an EF of 55 to 60%, no regional wall motion abnormalities, moderate concentric LVH, status post dacryon patch VSD repair with moderately sized membranous VSD with left-to-right shunting with a peak gradient of 137 mmHg, low normal RV systolic function, normal ventricular cavity size, moderately increased RV wall thickness, mildly elevated PASP estimated at 36.8 mmHg, severely dilated left atrium, mild aortic insufficiency, moderate aortic stenosis with a mean gradient of 19 mmHg and a valve area by VTI of 1.18 cm, and an estimated right atrial pressure of 3 mmHg.   Historically, he has reported daytime somnolence as he was staying up at night to watch his wife with MS, and was sleeping during the day.  He has been intolerant to CPAP mask with known OSA.   He was seen in the ED on 05/31/2022 with chest pain, dyspnea, and orthopnea.  Initial high-sensitivity troponin 17 with a delta of troponin 19.  BNP 247.  Chest x-ray showed cardiomegaly with pulmonary vascular congestion.  He was given IV Lasix with symptomatic improvement.   I saw him in 06/2022, at which time he continued to note baseline fatigue with poor sleep hygiene.  Given his history of VSD status post repair with concern for shunting, he was referred to the adult congenital heart clinic at  [facility] to establish care and for ongoing management.   Cardiac MRI on 08/02/2022 showed an EF of 55%, severe asymmetric LV septal hypertrophy with basal septal wall measuring 20 mm, mid-wall LGE in the LV myocardium at the RV insertion points, small membranous VSD with  left-to-right shunting with Qp:Qs 1.3, normal RV systolic function and size.  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    He was seen in our office in 08/2022 and was without symptoms of angina or cardiac decompensation.  He indicated he would not want to pursue invasive repair of VSD given his wife's health.  He was evaluated by [facility] adult congenital clinic in 10/2022 with recommendation to obtain echo at their office in 6 months.  He was last seen in our office in 03/2023 and remained without symptoms of angina or cardiac decompensation.  Echo at [facility] on 05/11/2023 showed an EF of 55%, severely increased septal wall thickness, moderately to severely dilated right ventricle with normal systolic function, low-flow low gradient moderate aortic stenosis, mild aortic insufficiency, mild to moderate mitral regurgitation, restrictive membranous VSD with left-to-right shunting status post dacryon patch repair in 2014 with a peak velocity of 6.1 cm second and peak gradient 140 mmHg and a Qp/Qs of 2.2, and likely no significant pulmonary hypertension.  No indication for intervention of VSD with recommendation to follow-up echo in 12 months for mitral regurgitation and LV septal hypertrophy.  He comes in doing well from a cardiac perspective and is without symptoms of angina or cardiac decompensation.  No palpitations, dizziness, presyncope, or syncope.  No lower extremity swelling or progressive orthopnea.  He does forget to take apixaban 2 days/week.  No falls or symptoms concerning for bleeding.  He has had some issues with hypoglycemia, and having had to call EMS once since he was last seen with a blood glucose of 35 (of note labs through PCP's office in 03/2023 show a glucose of 24).  Will also skip furosemide several days per week.   Labs independently reviewed: 07/2023 - potassium 4.7, BUN 19, serum creatinine 1.4, A1c 6.4 03/2023 - albumin 4.3, AST/ALT normal, Hgb 12.0, PLT 300, TC  128, TG 56, HDL 51, LDL 66 07/2022 - TSH normal  Past Medical History:  Diagnosis Date   Atrial fibrillation with rapid ventricular response (HCC) 01/16/2013   a. 01/2013 s/p R & L Maze and LAA clipping in setting of VSD repair;  b. Prev on amiodarone;  c. CHA2DS2VASc = 4-->Apixaban;  c. 01/2013 Recurrent afib/flutter requiring DCCV.   CHF (congestive heart failure) (HCC)    Colon polyp    COPD (chronic obstructive pulmonary disease) (HCC)    Coronary artery disease    a. 01/2013 s/p CABG x 1 (VG->PDA) in setting of VSD repair.   GERD (gastroesophageal reflux disease)    Hyperlipidemia    Hypertension    PCP- Orson Aloe - [place], phone; [phone]   Mitral valve disorder    Obesity    PAT (paroxysmal atrial tachycardia) (HCC)    a. 02/2015 s/p DCCV;  b. 12/2016 recurrent PAT - unchanged with escalating BB dose (rates 106-107).   PSVT (paroxysmal supraventricular tachycardia) (HCC)    a. 10/2011 Admitted to [facility] w/ SVT-->broke with IV dilt.   Recurrent Nephrolithiasis    Schatzki's ring    Sleep apnea    [facility]- in process of being evaluated now, states 12 yrs. ago was on CPAP but after having his deviated septum repaired, he had put the CPAP in storage.  Pt. will get a new machine soon.    Thoracic aortic aneurysm (HCC)    a. 01/2016 CTA chest: 4.0 cm Asc Ao; b. 01/2017 Echo: nl Ao root size.   Type II diabetes mellitus (HCC)    Umbilical hernia    "not repaired" (01/16/2013)   VSD (ventricular septal defect) w/ persistent systolic murmur    a. 01/2013 s/p VSD closure (dacron patch);  b. 01/2017 Echo: EF 50-55%, no rwma, mild AS, mild MR, mod dil LA, dilated RV, sev dil RA, mild TR. PASP nl.    Past Surgical History:  Procedure Laterality Date   CARDIAC CATHETERIZATION  > 5 yr ago   Done at [facility], reportedly clean   CARDIAC CATHETERIZATION  11/03/2012   CARDIOVERSION N/A 02/12/2015   Procedure: CARDIOVERSION;  Surgeon: Vesta Mixer, MD;  Location: [facility] ENDOSCOPY;  Service:  Cardiovascular;  Laterality: N/A;   CARDIOVERSION N/A 04/27/2017   Procedure: CARDIOVERSION;  Surgeon: Marinus Maw, MD;  Location: [facility] INVASIVE CV LAB;  Service: Cardiovascular;  Laterality: N/A;   CLIPPING OF ATRIAL APPENDAGE N/A 01/07/2013   Procedure: CLIPPING OF ATRIAL APPENDAGE;  Surgeon: Delight Ovens, MD;  Location: [facility] OR;  Service: Open Heart Surgery;  Laterality: N/A;   COLONOSCOPY W/ POLYPECTOMY     COLONOSCOPY WITH PROPOFOL N/A 01/05/2021   Procedure: COLONOSCOPY WITH PROPOFOL;  Surgeon: Toledo, Boykin Nearing, MD;  Location: [facility] ENDOSCOPY;  Service: Gastroenterology;  Laterality: N/A;   CORONARY ARTERY BYPASS GRAFT N/A 01/07/2013   Procedure: CORONARY ARTERY BYPASS GRAFTING (CABG);  Surgeon: Delight Ovens, MD;  Location: [facility] OR;  Service: Open Heart Surgery;  Laterality: N/A;  Times1 using endoscopically harvested saphenous vein graft to the PDA   ESOPHAGOGASTRODUODENOSCOPY N/A 01/05/2021   Procedure: ESOPHAGOGASTRODUODENOSCOPY (EGD);  Surgeon: Toledo, Boykin Nearing, MD;  Location: [facility] ENDOSCOPY;  Service: Gastroenterology;  Laterality: N/A;  DM   EYE SURGERY Left 02/03/1989   "clipped muscle so it wouldn't be pulling up" (01/16/2013)   INTRAOPERATIVE TRANSESOPHAGEAL ECHOCARDIOGRAM N/A 01/07/2013   Procedure: INTRAOPERATIVE TRANSESOPHAGEAL ECHOCARDIOGRAM;  Surgeon: Delight Ovens, MD;  Location: [facility] OR;  Service: Open Heart Surgery;  Laterality: N/A;   LEFT AND RIGHT HEART CATHETERIZATION WITH CORONARY ANGIOGRAM N/A 11/20/2012   Procedure: LEFT AND RIGHT HEART CATHETERIZATION WITH CORONARY ANGIOGRAM;  Surgeon: Dolores Patty, MD;  Location: [facility] CATH LAB;  Service: Cardiovascular;  Laterality: N/A;   MAZE N/A 01/07/2013   Procedure: MAZE;  Surgeon: Delight Ovens, MD;  Location: [facility] OR;  Service: Open Heart Surgery;  Laterality: N/A;   MULTIPLE EXTRACTIONS WITH ALVEOLOPLASTY N/A 12/11/2012   Procedure: Extraction of tooth #'s 2,3,4,5,14,15,17,21,22,23,24,25,26,27,32 wioth  alveoloplasty and bialteral fibrous tuberosity reductions.;  Surgeon: Charlynne Pander, DDS;  Location: [facility] ORS;  Service: Oral Surgery;  Laterality: N/A;   NASAL SEPTUM SURGERY  06/05/2002   RIGHT/LEFT HEART CATH AND CORONARY ANGIOGRAPHY N/A 08/02/2021   Procedure: RIGHT/LEFT HEART CATH AND CORONARY ANGIOGRAPHY;  Surgeon: Yvonne Kendall, MD;  Location: [facility] INVASIVE CV LAB;  Service: Cardiovascular;  Laterality: N/A;   sepfal deveation repair     TEE WITHOUT CARDIOVERSION N/A 11/21/2012   Procedure: TRANSESOPHAGEAL ECHOCARDIOGRAM (TEE);  Surgeon: Dolores Patty, MD;  Location: [facility] ENDOSCOPY;  Service: Cardiovascular;  Laterality: N/A;   VSD REPAIR N/A 01/07/2013   Procedure: VENTRICULAR SEPTAL DEFECT (VSD) REPAIR;  Surgeon: Delight Ovens, MD;  Location: [facility] OR;  Service: Open Heart Surgery;  Laterality: N/A;   VSD REPAIR      Current Medications: Current  Meds  Medication Sig   acetaminophen (TYLENOL) 500 MG tablet Take 1,000 mg by mouth daily as needed for moderate pain or headache.   amiodarone (PACERONE) 200 MG tablet Take 1 tablet (200 mg total) by mouth daily.   amLODipine (NORVASC) 2.5 MG tablet Take by mouth daily.   atorvastatin (LIPITOR) 20 MG tablet Take 1 tablet (20 mg total) by mouth at bedtime.   furosemide (LASIX) 40 MG tablet Take 0.5 tablets (20 mg total) by mouth daily.   glipiZIDE (GLUCOTROL) 5 MG tablet Take by mouth daily before breakfast.   Glucose Blood (BLOOD GLUCOSE TEST STRIPS) STRP 1 strip by In Vitro route 2 (two) times daily.   isosorbide mononitrate (IMDUR) 30 MG 24 hr tablet Take 0.5 tablets (15 mg total) by mouth daily.   losartan (COZAAR) 100 MG tablet Take 1 tablet (100 mg total) by mouth daily.   metFORMIN (GLUCOPHAGE) 1000 MG tablet Take 1,000 mg by mouth 2 (two) times daily.   metoprolol succinate (TOPROL-XL) 100 MG 24 hr tablet Take 1 tablet (100 mg total) by mouth daily. Take with or immediately following a meal.   omeprazole (PRILOSEC) 40 MG  capsule Take 40 mg by mouth daily before breakfast.    sertraline (ZOLOFT) 50 MG tablet Take 50 mg by mouth daily.   [DISCONTINUED] apixaban (ELIQUIS) 5 MG TABS tablet Take 1 tablet (5 mg total) by mouth 2 (two) times daily.   [DISCONTINUED] glipiZIDE (GLUCOTROL) 10 MG tablet Take 5-10 mg by mouth See admin instructions. 10mg  in the am and 5mg  in the pm   [DISCONTINUED] pioglitazone (ACTOS) 30 MG tablet Take 30 mg by mouth daily.   [DISCONTINUED] traMADol (ULTRAM) 50 MG tablet Take One tab PO Q6 hours PRN pain    Allergies:   Biaxin [clarithromycin]   Social History   Socioeconomic History   Marital status: Married    Spouse name: Not on file   Number of children: Not on file   Years of education: Not on file   Highest education level: Not on file  Occupational History   Occupation: Manufacturing    Comment: Heavy work at times  Tobacco Use   Smoking status: Never    Passive exposure: Never   Smokeless tobacco: Never  Vaping Use   Vaping status: Never Used  Substance and Sexual Activity   Alcohol use: No   Drug use: No   Sexual activity: Not Currently  Other Topics Concern   Not on file  Social History Narrative   Still works full time. Works around the yard. Lives with wife. Has 2 sisters, neither with cardiac issues.   Social Drivers of Corporate investment banker Strain: Not on file  Food Insecurity: Not on file  Transportation Needs: Not on file  Physical Activity: Not on file  Stress: Not on file  Social Connections: Not on file     Family History:  The patient's family history includes Cancer in his mother; Diabetes in his mother.  ROS:   12-point review of systems is negative unless otherwise noted in the HPI.   EKGs/Labs/Other Studies Reviewed:    Studies reviewed were summarized above. The additional studies were reviewed today:  2D echo 05/11/2023 [facility]): Summary   1. The left ventricle is normal in size with severely increased septal wall   thickness.   2. The left ventricular systolic function is normal, LVEF is visually  estimated at 55%.    3. The right ventricle is moderately to severely dilated  in size, with  normal systolic function.    4. There is low flow, low gradient moderate aortic valve stenosis.    5. There is mild aortic regurgitation.    6. There is mild to moderate mitral valve regurgitation.    7. S/p Dacron Patch VSD repair (2014). There is a restrictive, membranous  VSD with left to right shunting. Peak velocity of 6.1 cm/sec and peak gradient  of 148 mmHg.    8. Qp/Qs is 2.02.    9. Likely no significant pulmonary hypertension. SBP 145 mmHg (obtained from  chart). Peak VSD gradient 148 mmHg.  __________  cMRI 08/02/2022: IMPRESSION: 1. Normal LV systolic function.  LVEF 55%. 2. Severe asymmetric LV septal hypertrophy, basal septal wall measuring 20mm in thickness. 3. There is midwall late gadolinium enhancement in the left ventricular myocardium at the RV insertion points. 4. There is a small membraneous ventricular septal defect with left-to-right shunting. Qp:Qs = 1.3 5. Normal RV size and function. 6. 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 ___________   Limited echo 11/25/2021: 1. Left ventricular ejection fraction, by estimation, is 55 to 60%. The  left ventricle has normal function. The left ventricle has no regional  wall motion abnormalities. There is moderate concentric left ventricular  hypertrophy. The average left  ventricular global longitudinal strain is -14.3 %.   2. s/p Dacron Patch VSD repair. There is a moderately sized membranous  ventricular septal defect with left to right shunting, peak gradient 137  mm Hg.   3. Right ventricular systolic function is low normal. The right  ventricular size is normal. Moderately increased right ventricular wall  thickness. There is mildly elevated pulmonary artery systolic pressure.  The estimated right  ventricular systolic  pressure is 36.8 mmHg.   4. Left atrial size was severely dilated.   5. The mitral valve is normal in structure. No evidence of mitral valve  regurgitation. No evidence of mitral stenosis.   6. The aortic valve is normal in structure. Aortic valve regurgitation is  mild. Moderate aortic valve stenosis. Aortic valve area, by VTI measures  1.18 cm. Aortic valve mean gradient measures 19.0 mmHg. Aortic valve Vmax  measures 2.89 m/s.   7. The inferior vena cava is normal in size with greater than 50%  respiratory variability, suggesting right atrial pressure of 3 mmHg.  __________   [facility] 08/02/2021: Conclusions: Moderate-severe but non-critical disease involving ostial D1, ramus intermedius, and rPDA. Widely patent SVG-rPDA. Mildly elevated left heart filling pressures with prominent V-waves noted on PCWP tracing (PCWP 26 mmHg; LVEDP 20-25 mmHg). Severely elevated right heart filling pressures (RAP/RVEDP 16 mmHg). Moderate pulmonary hypertension (mean PAP 40 mmHg; PVR 2.8 WU). Low normal to mildly reduced Fick cardiac output/index (CO 4.9 L/min, CI 2.3 L/min/m). No significant intracardiac shunting (QPS 1.0-1.2).   Recommendations: Escalate diuresis: We will increase furosemide to 40 mg twice daily. Add isosorbide mononitrate 15 mg daily for antianginal therapy. Increased goal-directed medical therapy for nonischemic cardiomyopathy as an outpatient, as tolerated. Continue clinical and echocardiographic monitoring of VSD; shunting does not appear to be significant at this time. Consider referral to advanced heart failure clinic if symptoms persist. Restart apixaban tomorrow morning if no evidence of bleeding/vascular injury. __________   Luci Bank patch 04/2021: HR 52 - 231, average 77 bpm. No atrial fibrillation detected. 47 SVT, longest lasting 19 beats. Occasional ventricular ectopy, 3.7%. Rare supraventricular ectopy, 4.1%. No sustained  arrhythmias. __________   2D echo 12/28/2020: 1. Rhythm appears to be atrial flutter -  no A waves noted.   2. Prior dacron patch VSD repair is compromised with left to right  shunting noted at the membranous septum (images 35, 45) - peak gradient is  122 mmHg (systolic BP). The neck of the fenestration measures about 0.5  cm. Left ventricular ejection fraction,   by estimation, is 45 to 50%. The left ventricle has mildly decreased  function. The left ventricle demonstrates global hypokinesis. There is  moderate left ventricular hypertrophy. Left ventricular diastolic function  could not be evaluated.   3. Right ventricular systolic function is normal. The right ventricular  size is normal. There is mildly elevated pulmonary artery systolic  pressure. The estimated right ventricular systolic pressure is 39.8 mmHg.   4. Left atrial size was severely dilated.   5. Right atrial size was moderately dilated.   6. The mitral valve is abnormal. Trivial mitral valve regurgitation.  Moderate mitral annular calcification.   7. Tricuspid valve regurgitation is moderate.   8. The aortic valve is abnormal. Aortic valve regurgitation is trivial.   9. Aortic dilatation noted. There is borderline dilatation of the  ascending aorta, measuring 38 mm.  10. The inferior vena cava is normal in size with <50% respiratory  variability, suggesting right atrial pressure of 8 mmHg.   Comparison(s): Changes from prior study are noted. 06/14/2018: LVEF  55-60%,.  __________   2D echo 06/14/2018: - Left ventricle: The cavity size was normal. There was moderate    concentric hypertrophy. Systolic function was normal. The    estimated ejection fraction was in the range of 55% to 60%. Wall    motion was normal; there were no regional wall motion    abnormalities. Features are consistent with a pseudonormal left    ventricular filling pattern, with concomitant abnormal relaxation    and increased filling pressure  (grade 2 diastolic dysfunction).  - Aortic valve: Valve mobility was restricted. There was mild    stenosis. There was moderate regurgitation. Peak velocity (S):    233 cm/s. Mean gradient (S): 13 mm Hg. Peak gradient (S): 22 mm    Hg.  - Mitral valve: Transvalvular velocity was within the normal range.    There was no evidence for stenosis. There was moderate    regurgitation.  - Left atrium: The atrium was severely dilated.  - Right ventricle: The cavity size was mildly dilated. Wall    thickness was normal. Systolic function was normal.  - Atrial septum: A patent foramen ovale cannot be excluded.  - Tricuspid valve: There was mild regurgitation.  - Pulmonary arteries: Systolic pressure was mildly increased. PA    peak pressure: 41 mm Hg (S).  __________   See CV Studies in Epic for more remote imaging   EKG:  EKG is not ordered today.    Recent Labs: 09/07/2022: BUN 22; Creatinine, Ser 1.42; Potassium 4.3; Sodium 138  Recent Lipid Panel    Component Value Date/Time   CHOL 142 04/17/2013 0833   CHOL 192 10/29/2011 0429   TRIG 125 04/17/2013 0833   TRIG 150 10/29/2011 0429   HDL 41 04/17/2013 0833   HDL 32 (L) 10/29/2011 0429   CHOLHDL 3.5 04/17/2013 0833   VLDL 30 10/29/2011 0429   LDLCALC 76 04/17/2013 0833   LDLCALC 130 (H) 10/29/2011 0429    PHYSICAL EXAM:    VS:  BP (!) 120/56 (BP Location: Left Arm)   Pulse (!) 57   Ht 6' (1.829 m)   Wt 200 lb 3.2  oz (90.8 kg)   SpO2 96%   BMI 27.15 kg/m   BMI: Body mass index is 27.15 kg/m.  Physical Exam Vitals reviewed.  Constitutional:      Appearance: He is well-developed.  HENT:     Head: Normocephalic and atraumatic.  Eyes:     General:        Right eye: No discharge.        Left eye: No discharge.  Neck:     Vascular: No JVD.  Cardiovascular:     Rate and Rhythm: Normal rate and regular rhythm.     Heart sounds: S1 normal and S2 normal. Heart sounds not distant. No midsystolic click and no opening snap.  Murmur heard.     Systolic murmur is present with a grade of 2/6 at the upper right sternal border.     Systolic murmur of grade 2/6 is also present at the lower left sternal border.     No friction rub.  Pulmonary:     Effort: Pulmonary effort is normal. No respiratory distress.     Breath sounds: Normal breath sounds. No decreased breath sounds, wheezing, rhonchi or rales.  Chest:     Chest wall: No tenderness.  Musculoskeletal:     Cervical back: Normal range of motion.     Right lower leg: No edema.     Left lower leg: No edema.  Skin:    General: Skin is warm and dry.     Nails: There is no clubbing.  Neurological:     Mental Status: He is alert and oriented to person, place, and time.  Psychiatric:        Speech: Speech normal.        Behavior: Behavior normal.        Thought Content: Thought content normal.        Judgment: Judgment normal.     Wt Readings from Last 3 Encounters:  08/21/23 200 lb 3.2 oz (90.8 kg)  03/21/23 208 lb 8 oz (94.6 kg)  08/25/22 209 lb 12.8 oz (95.2 kg)     ASSESSMENT & PLAN:   CAD status post CABG without angina: He is doing well and without symptoms concerning for angina or cardiac decompensation.  On apixaban in place of aspirin given underlying A-fib.  Continue aggressive risk factor modification and secondary prevention including atorvastatin, Imdur, and Toprol-XL.  No indication for further ischemic testing at this time.  HFimpEF/LVH: Most recent echo from 11/2021 showed improvement in LV systolic function with an EF of 55 to 60%.  He appears euvolemic and well compensated.  Continue current GDMT including losartan and Toprol-XL.  Defer further escalation of GDMT at this time given asymptomatic presentation and in the setting of normalization of LV systolic function.  Should he have recurrence of symptoms, would look to transition ARB to ARNI with potential addition of MRA and SGLT2 inhibitor.  No evidence of LVOT on prior imaging.  VSD  status post Dacron repair: Qp/Qs 1.3 by cardiac MRI 07/2022 with moderately dilated right atrium and normal RV cavity size.  LHC in 07/2021 without significant shunting.  Followed by the adult congenital cardiology clinic at [facility].  No current indication for intervention.  Persistent A-fib: Maintaining sinus rhythm on physical exam on Toprol-XL and amiodarone 200 mg daily.  Recent labs stable.  Anticipate follow-up LFT and thyroid function studies at next visit if not recently obtained.  CHA2DS2-VASc at least 5 (CHF, HTN, age x 2, vascular disease).  He remains  on apixaban 5 mg twice daily and does not meet reduced dosing criteria.  Importance of not missing anticoagulation was discussed in detail.  Aortic stenosis: Stable moderate aortic stenosis by echo at [facility] in 05/2023.  Follow-up imaging in 05/2024.  Mitral regurgitation: Initially noted during LHC to evaluate VSD.  Echo at [facility] in 05/2023 showed moderate to severe eccentric mitral regurgitation.  Asymptomatic.  Follow-up with echo as outlined above.  Thoracic aortic ectasia: CTA chest/aorta in 06/2022 showed stable ectasia of the aortic root measuring 4.2 cm with no evidence of aneurysmal dilatation of the thoracic aorta and no specific imaging follow-up recommended.   HTN: Blood pressure is well-controlled in the office today.  Continue current pharmacotherapy including Imdur 15 mg, losartan 100 mg, and Toprol-XL 100 mg.  HLD: LDL 66 in 03/2023 with normal AST/ALT at that time.  He remains on atorvastatin 20 mg.      Disposition: F/u with Dr. Okey Dupre or an APP in 6 months and EP as directed.   Medication Adjustments/Labs and Tests Ordered: Current medicines are reviewed at length with the patient today.  Concerns regarding medicines are outlined above. Medication changes, Labs and Tests ordered today are summarized above and listed in the Patient Instructions accessible in Encounters.   Signed, Eula Listen, PA-C 08/21/2023 5:14 PM     [facility] [address] [place], [place] [address] [phone]

## 2023-08-21 ENCOUNTER — Ambulatory Visit: Payer: Medicare Other | Attending: Physician Assistant | Admitting: Physician Assistant

## 2023-08-21 VITALS — BP 120/56 | HR 57 | Ht 72.0 in | Wt 200.2 lb

## 2023-08-21 DIAGNOSIS — Q210 Ventricular septal defect: Secondary | ICD-10-CM | POA: Diagnosis present

## 2023-08-21 DIAGNOSIS — I350 Nonrheumatic aortic (valve) stenosis: Secondary | ICD-10-CM | POA: Insufficient documentation

## 2023-08-21 DIAGNOSIS — I77810 Thoracic aortic ectasia: Secondary | ICD-10-CM | POA: Diagnosis present

## 2023-08-21 DIAGNOSIS — I340 Nonrheumatic mitral (valve) insufficiency: Secondary | ICD-10-CM | POA: Diagnosis present

## 2023-08-21 DIAGNOSIS — I2510 Atherosclerotic heart disease of native coronary artery without angina pectoris: Secondary | ICD-10-CM | POA: Diagnosis present

## 2023-08-21 DIAGNOSIS — I4819 Other persistent atrial fibrillation: Secondary | ICD-10-CM | POA: Diagnosis present

## 2023-08-21 DIAGNOSIS — I10 Essential (primary) hypertension: Secondary | ICD-10-CM | POA: Insufficient documentation

## 2023-08-21 DIAGNOSIS — E785 Hyperlipidemia, unspecified: Secondary | ICD-10-CM | POA: Diagnosis present

## 2023-08-21 DIAGNOSIS — Z951 Presence of aortocoronary bypass graft: Secondary | ICD-10-CM | POA: Insufficient documentation

## 2023-08-21 DIAGNOSIS — I5032 Chronic diastolic (congestive) heart failure: Secondary | ICD-10-CM | POA: Diagnosis present

## 2023-08-21 DIAGNOSIS — Z8679 Personal history of other diseases of the circulatory system: Secondary | ICD-10-CM | POA: Insufficient documentation

## 2023-08-21 MED ORDER — APIXABAN 5 MG PO TABS: 5.0000 mg | ORAL_TABLET | Freq: Two times a day (BID) | ORAL | 3 refills | Status: AC

## 2023-08-21 NOTE — Patient Instructions (Signed)
 Medication Instructions:  Your physician recommends that you continue on your current medications as directed. Please refer to the Current Medication list given to you today.  *If you need a refill on your cardiac medications before your next appointment, please call your pharmacy*   Lab Work: No labs ordered today  If you have labs (blood work) drawn today and your tests are completely normal, you will receive your results only by: MyChart Message (if you have MyChart) OR A paper copy in the mail If you have any lab test that is abnormal or we need to change your treatment, we will call you to review the results.   Testing/Procedures: No test ordered today    Follow-Up: At [facility], you and your health needs are our priority.  As part of our continuing mission to provide you with exceptional heart care, we have created designated Provider Care Teams.  These Care Teams include your primary Cardiologist (physician) and Advanced Practice Providers (APPs -  Physician Assistants and Nurse Practitioners) who all work together to provide you with the care you need, when you need it.    Your next appointment:   6 month(s)  Provider:   You may see Yvonne Kendall, MD or one of the following Advanced Practice Providers on your designated Care Team:   Nicolasa Ducking, NP Eula Listen, PA-C Cadence Fransico Michael, PA-C Charlsie Quest, NP Carlos Levering, NP

## 2023-12-03 ENCOUNTER — Telehealth: Payer: Self-pay

## 2023-12-03 NOTE — Telephone Encounter (Signed)
 Copied from CRM [id number]. Topic: Clinical - Prescription Issue >> Dec 03, 2023  2:48 PM Essie A wrote: Reason for CRM: [facility] is calling because there is discrepancy with the dosage  1 100 mg tablet daily as opposed 1 25 mg tablet daily.  Please return the call [phone].

## 2023-12-04 NOTE — Telephone Encounter (Unsigned)
 Copied from CRM [id number]. Topic: Clinical - Prescription Issue >> Dec 03, 2023  2:48 PM Essie A wrote: Reason for CRM: [facility] is calling because there is discrepancy with the dosage  1 100 mg tablet daily as opposed 1 25 mg tablet daily.  Please return the call [phone]. >> Dec 04, 2023 12:08 PM Leotis ORN wrote:  Losartan  dosage discrepancy for patient of Aaron Jones, [facility]. Pharmacy called regarding 100mg  vs. 25mg  Losartan  daily. Please contact prescribing provider Aaron Jones's team at [facility] to clarify/resolve this discrepancy. This CRM was originally misrouted to [facility]

## 2023-12-04 NOTE — Telephone Encounter (Signed)
 Sent to wrong office, not a pt at [facility].

## 2023-12-06 ENCOUNTER — Other Ambulatory Visit: Payer: Self-pay | Admitting: *Deleted

## 2023-12-06 MED ORDER — LOSARTAN POTASSIUM 100 MG PO TABS: 100.0000 mg | ORAL_TABLET | Freq: Every day | ORAL | 3 refills | Status: DC

## 2024-01-10 ENCOUNTER — Other Ambulatory Visit: Payer: Self-pay | Admitting: Student

## 2024-01-10 DIAGNOSIS — R413 Other amnesia: Secondary | ICD-10-CM

## 2024-01-14 ENCOUNTER — Ambulatory Visit
Admission: RE | Admit: 2024-01-14 | Discharge: 2024-01-14 | Disposition: A | Discharge status: Home / Self Care | Source: Ambulatory Visit | Attending: Student | Admitting: Student

## 2024-01-14 DIAGNOSIS — R413 Other amnesia: Secondary | ICD-10-CM | POA: Diagnosis present

## 2024-02-05 ENCOUNTER — Encounter: Payer: Self-pay | Admitting: Ophthalmology

## 2024-02-06 ENCOUNTER — Encounter: Payer: Self-pay | Admitting: Ophthalmology

## 2024-02-06 NOTE — Anesthesia Preprocedure Evaluation (Addendum)
 Anesthesia Evaluation  Patient identified by MRN, date of birth, ID band Patient awake    Reviewed: Allergy & Precautions, H&P , NPO status , Patient's Chart, lab work & pertinent test results  Airway Mallampati: I  TM Distance: >3 FB Neck ROM: Full    Dental no notable dental hx. (+) Edentulous Upper, Edentulous Lower   Pulmonary neg pulmonary ROS, sleep apnea , COPD   Pulmonary exam normal breath sounds clear to auscultation       Cardiovascular hypertension, + angina  + CAD and +CHF  + Valvular Problems/Murmurs MR  Rhythm:Regular Rate:Normal + Systolic murmurs 93-76-76 VSD Repair and Prior CABG, COPD,                  Arrythmias:Atrial Fibrillation; Risk Factors:Dyslipidemia.    Sonographer:    Lynwood Silvas RDCS  Referring Phys: [id number] CADENCE H FURTH   IMPRESSIONS     1. Left ventricular ejection fraction, by estimation, is 55 to 60%. The  left ventricle has normal function. The left ventricle has no regional  wall motion abnormalities. There is moderate concentric left ventricular  hypertrophy. The average left  ventricular global longitudinal strain is -14.3 %.   2. s/p Dacron Patch VSD repair. There is a moderately sized membranous  ventricular septal defect with left to right shunting, peak gradient 137  mm Hg.   3. Right ventricular systolic function is low normal. The right  ventricular size is normal. Moderately increased right ventricular wall  thickness. There is mildly elevated pulmonary artery systolic pressure.  The estimated right ventricular systolic  pressure is 36.8 mmHg.   4. Left atrial size was severely dilated.   5. The mitral valve is normal in structure. No evidence of mitral valve  regurgitation. No evidence of mitral stenosis.   6. The aortic valve is normal in structure. Aortic valve regurgitation is  mild. Moderate aortic valve stenosis. Aortic valve area, by VTI measures  1.18 cm. Aortic  valve mean gradient measures 19.0 mmHg. Aortic valve Vmax  measures 2.89 m/s.   7. The inferior vena cava is normal in size with greater than 50%  respiratory variability, suggesting right atrial pressure of 3 mmHg.   Echo 7/22 EF of 45-50%, global hypokinesis, moderate LVH, prior dacryon patch VSD repair compromised with left to right shunting noted at the membranous septurm with a peak gradient of 122 mmHg. Atrial flutter  Stress test  Cardiac catheterization 2/23 Moderate to severe noncritical disease involving the ostial D1, ramus intermedius and RPDA with widely patent SVG to RPDA.  Electrophysiology 11/22 Ziopatch: sinus rhythms predominating. SVT (longest interval 19 beats.   TTE (12/06) showed evidence of moderate-severe eccentric MR. Patient remains asymptomatic   Ventricular septal defect s/p repair (2014) History of VSD s/p repair during CABG in 2014 with small residual defect. Cardiac MRI (07/2022) showed Qp/Qs of 1.3, with moderately dilated RA and normal RV size. Left heart catheterization (07/2021) without significant shunting. -No indication for intervention at this time.  Increased LV septal mass:  Cardiac MRI with asymmetric LV septal hypertrophy measuring 20mm without any evidence of LVOT. TTE today again with more mild appearing LV septal hypertrophy. Patient is asymptomatic. -Follow up on next echo as above  Coronary artery disease s/p CABG History of CAD s/p CABG in 2014. Now remains without any anginal symptoms. Last LHC with widely patent SVG to the PDA with moderate disease in the D1, ramus and rPDA (Chris End). Lipids well controlled with recent (03/2023) LDL of  66 on atorvastatin . -Continue medical therapy for CAD.   Atrial fibrillation -Continues on amiodarone , metoprolol  and apixaban    02-12-24 in sinus rhythm on exam    Neuro/Psych Office note 01-09-24 sister-in-law reports some confusion sometimes and that he forgets things a lot. He becomes agitated and  angry easily and does not take things seriously. She has also noticed a lot of mumbling and difficulty expressing himself clearly. Several months ago he got confused driving back from [place], reporting that if he goes off his usual route or if there are any detours, he will get lost. His wife reports that he gets easily frustrated if she does not do exactly what he says. The patient lives with his wife, who has MS and memory problems, and is her main caregiver. Their godson lives in the house as well but is not involved in their care. The patient's sleep is erratic because he is up and down all night with his wife. He still drives and denies any issues when driving in town, but does report confusion when driving distances or in unfamiliar places. His sister-in-law also notices that he is stumbling sometimes when walking. He has fallen from this, but it has been several months since his last fall. Sister-in law manages his medications by filling a 2-week pill box, but reports that when she picks it up to refill a week's worth of medication is left behind. The patient is unaware of all of these issues. negative neurological ROS  negative psych ROS   GI/Hepatic negative GI ROS, Neg liver ROS,GERD  ,,  Endo/Other  diabetes    Renal/GU Renal diseasenegative Renal ROS  negative genitourinary   Musculoskeletal negative musculoskeletal ROS (+)    Abdominal   Peds negative pediatric ROS (+)  Hematology negative hematology ROS (+)   Anesthesia Other Findings GERD (gastroesophageal reflux disease)  Hyperlipidemia VSD (ventricular septal defect) w/ persistent systolic murmur but S/P repair Hypertension Coronary artery disease  Sleep apnea Type II diabetes mellitus (HCC)  Umbilical hernia Atrial fibrillation with rapid ventricular response (HCC)  COPD (chronic obstructive pulmonary disease) (HCC) PAT (paroxysmal atrial tachycardia) (HCC) Thoracic aortic aneurysm (HCC) PSVT (paroxysmal  supraventricular tachycardia) (HCC)  Recurrent Nephrolithiasis CHF (congestive heart failure) (HCC)  Mitral valve disorder Obesity  Colon polyp Schatzki's ring Moderate aortic stenosis by prior echocardiogram Mild aortic regurgitation with left ventricular dilation by prior echocardiogram  Loss of memory Cognitive and behavioral changes, that patient does not recall, per office note Paroxysmal atrial fibrillation (HCC)  S/P VSD closure    Reproductive/Obstetrics negative OB ROS                              Anesthesia Physical Anesthesia Plan  ASA: 3  Anesthesia Plan: MAC   Post-op Pain Management:    Induction: Intravenous  PONV Risk Score and Plan:   Airway Management Planned: Natural Airway and Nasal Cannula  Additional Equipment:   Intra-op Plan:   Post-operative Plan:   Informed Consent: I have reviewed the patients History and Physical, chart, labs and discussed the procedure including the risks, benefits and alternatives for the proposed anesthesia with the patient or authorized representative who has indicated his/her understanding and acceptance.     Dental Advisory Given  Plan Discussed with: Anesthesiologist, CRNA and Surgeon  Anesthesia Plan Comments: (Patient consented for risks of anesthesia including but not limited to:  - adverse reactions to medications - damage to eyes, teeth, lips or  other oral mucosa - nerve damage due to positioning  - sore throat or hoarseness - Damage to heart, brain, nerves, lungs, other parts of body or loss of life  Patient voiced understanding and assent.)         Anesthesia Quick Evaluation

## 2024-02-07 ENCOUNTER — Encounter: Payer: Self-pay | Admitting: Ophthalmology

## 2024-02-08 NOTE — Discharge Instructions (Signed)

## 2024-02-12 ENCOUNTER — Encounter
Admission: RE | Disposition: A | Discharge status: Home / Self Care | Payer: Self-pay | Source: Home / Self Care | Attending: Ophthalmology

## 2024-02-12 ENCOUNTER — Ambulatory Visit: Payer: Self-pay | Admitting: Anesthesiology

## 2024-02-12 ENCOUNTER — Encounter: Payer: Self-pay | Admitting: Ophthalmology

## 2024-02-12 ENCOUNTER — Ambulatory Visit
Admission: RE | Admit: 2024-02-12 | Discharge: 2024-02-12 | Disposition: A | Discharge status: Home / Self Care | Attending: Ophthalmology | Admitting: Ophthalmology

## 2024-02-12 ENCOUNTER — Other Ambulatory Visit: Payer: Self-pay

## 2024-02-12 DIAGNOSIS — I350 Nonrheumatic aortic (valve) stenosis: Secondary | ICD-10-CM | POA: Insufficient documentation

## 2024-02-12 DIAGNOSIS — J449 Chronic obstructive pulmonary disease, unspecified: Secondary | ICD-10-CM | POA: Diagnosis not present

## 2024-02-12 DIAGNOSIS — E1136 Type 2 diabetes mellitus with diabetic cataract: Secondary | ICD-10-CM | POA: Diagnosis not present

## 2024-02-12 DIAGNOSIS — I110 Hypertensive heart disease with heart failure: Secondary | ICD-10-CM | POA: Insufficient documentation

## 2024-02-12 DIAGNOSIS — I480 Paroxysmal atrial fibrillation: Secondary | ICD-10-CM | POA: Diagnosis not present

## 2024-02-12 DIAGNOSIS — Z951 Presence of aortocoronary bypass graft: Secondary | ICD-10-CM | POA: Insufficient documentation

## 2024-02-12 DIAGNOSIS — I509 Heart failure, unspecified: Secondary | ICD-10-CM | POA: Diagnosis not present

## 2024-02-12 DIAGNOSIS — I2510 Atherosclerotic heart disease of native coronary artery without angina pectoris: Secondary | ICD-10-CM | POA: Diagnosis not present

## 2024-02-12 DIAGNOSIS — Z7984 Long term (current) use of oral hypoglycemic drugs: Secondary | ICD-10-CM | POA: Diagnosis not present

## 2024-02-12 DIAGNOSIS — Z7901 Long term (current) use of anticoagulants: Secondary | ICD-10-CM | POA: Insufficient documentation

## 2024-02-12 DIAGNOSIS — G4730 Sleep apnea, unspecified: Secondary | ICD-10-CM | POA: Insufficient documentation

## 2024-02-12 DIAGNOSIS — H2512 Age-related nuclear cataract, left eye: Secondary | ICD-10-CM | POA: Insufficient documentation

## 2024-02-12 DIAGNOSIS — E669 Obesity, unspecified: Secondary | ICD-10-CM | POA: Diagnosis not present

## 2024-02-12 DIAGNOSIS — E785 Hyperlipidemia, unspecified: Secondary | ICD-10-CM | POA: Diagnosis not present

## 2024-02-12 DIAGNOSIS — K219 Gastro-esophageal reflux disease without esophagitis: Secondary | ICD-10-CM | POA: Diagnosis not present

## 2024-02-12 DIAGNOSIS — Z6826 Body mass index (BMI) 26.0-26.9, adult: Secondary | ICD-10-CM | POA: Diagnosis not present

## 2024-02-12 HISTORY — DX: Atherosclerotic heart disease of native coronary artery without angina pectoris: I25.10

## 2024-02-12 HISTORY — DX: Presence of aortocoronary bypass graft: Z95.1

## 2024-02-12 HISTORY — DX: Pulmonary hypertension, unspecified: I27.20

## 2024-02-12 HISTORY — DX: Personal history of other diseases of the circulatory system: Z86.79

## 2024-02-12 HISTORY — DX: Other symptoms and signs involving cognitive functions and awareness: R41.89

## 2024-02-12 HISTORY — DX: Other amnesia: R41.3

## 2024-02-12 HISTORY — DX: Paroxysmal atrial fibrillation: I48.0

## 2024-02-12 HISTORY — DX: Other hypertrophic cardiomyopathy: I42.2

## 2024-02-12 HISTORY — DX: Nonrheumatic mitral (valve) insufficiency: I34.0

## 2024-02-12 HISTORY — DX: Unspecified hearing loss, unspecified ear: H91.90

## 2024-02-12 HISTORY — DX: Unsteadiness on feet: R26.81

## 2024-02-12 HISTORY — DX: Nonrheumatic aortic (valve) stenosis: I35.0

## 2024-02-12 HISTORY — DX: Other specified congenital malformations of circulatory system: Q28.8

## 2024-02-12 HISTORY — DX: Personal history of (corrected) congenital malformations of heart and circulatory system: Z87.74

## 2024-02-12 HISTORY — DX: Complete loss of teeth, unspecified cause, unspecified class: K08.109

## 2024-02-12 HISTORY — PX: CATARACT EXTRACTION W/PHACO: SHX586

## 2024-02-12 HISTORY — DX: Other symptoms and signs involving appearance and behavior: R46.89

## 2024-02-12 HISTORY — DX: Cardiomegaly: I35.1

## 2024-02-12 LAB — GLUCOSE, CAPILLARY: Glucose-Capillary: 91 mg/dL (ref 70–99)

## 2024-02-12 SURGERY — PHACOEMULSIFICATION, CATARACT, WITH IOL INSERTION
Anesthesia: Monitor Anesthesia Care | Site: Eye | Laterality: Left

## 2024-02-12 MED ORDER — LIDOCAINE HCL (PF) 2 % IJ SOLN
INTRAOCULAR | Status: DC | PRN
  Administered 2024-02-12: 2 mL

## 2024-02-12 MED ORDER — FENTANYL CITRATE (PF) 100 MCG/2ML IJ SOLN
INTRAMUSCULAR | Status: AC
  Filled 2024-02-12: qty 2

## 2024-02-12 MED ORDER — SIGHTPATH DOSE#1 NA CHONDROIT SULF-NA HYALURON 40-17 MG/ML IO SOLN
INTRAOCULAR | Status: DC | PRN
  Administered 2024-02-12: 1 mL via INTRAOCULAR

## 2024-02-12 MED ORDER — MOXIFLOXACIN HCL 0.5 % OP SOLN
OPHTHALMIC | Status: DC | PRN
  Administered 2024-02-12: .2 mL via OPHTHALMIC

## 2024-02-12 MED ORDER — SIGHTPATH DOSE#1 BSS IO SOLN
INTRAOCULAR | Status: DC | PRN
  Administered 2024-02-12: 15 mL via INTRAOCULAR

## 2024-02-12 MED ORDER — ARMC OPHTHALMIC DILATING DROPS
1.0000 | OPHTHALMIC | Status: DC | PRN
  Administered 2024-02-12 (×3): 1 via OPHTHALMIC

## 2024-02-12 MED ORDER — LACTATED RINGERS IV SOLN: INTRAVENOUS | Status: DC

## 2024-02-12 MED ORDER — SIGHTPATH DOSE#1 BSS IO SOLN
INTRAOCULAR | Status: DC | PRN
  Administered 2024-02-12: 60 mL via OPHTHALMIC

## 2024-02-12 MED ORDER — TETRACAINE HCL 0.5 % OP SOLN
1.0000 [drp] | OPHTHALMIC | Status: DC | PRN
  Administered 2024-02-12 (×3): 1 [drp] via OPHTHALMIC

## 2024-02-12 MED ORDER — MIDAZOLAM HCL 2 MG/2ML IJ SOLN
INTRAMUSCULAR | Status: AC
  Filled 2024-02-12: qty 2

## 2024-02-12 MED ORDER — MIDAZOLAM HCL 2 MG/2ML IJ SOLN
INTRAMUSCULAR | Status: DC | PRN
  Administered 2024-02-12: 1 mg via INTRAVENOUS

## 2024-02-12 MED ORDER — FENTANYL CITRATE (PF) 100 MCG/2ML IJ SOLN
INTRAMUSCULAR | Status: DC | PRN
  Administered 2024-02-12: 25 ug via INTRAVENOUS

## 2024-02-12 MED ORDER — BRIMONIDINE TARTRATE-TIMOLOL 0.2-0.5 % OP SOLN
OPHTHALMIC | Status: DC | PRN
  Administered 2024-02-12: 1 [drp] via OPHTHALMIC

## 2024-02-12 SURGICAL SUPPLY — 10 items
CANNULA ANT/CHMB 27G (MISCELLANEOUS) ×2 IMPLANT
CYSTOTOME ANGL RVRS SHRT 25G (CUTTER) ×2 IMPLANT
FEE CATARACT SUITE SIGHTPATH (MISCELLANEOUS) ×2 IMPLANT
GLOVE BIOGEL PI IND STRL 8 (GLOVE) ×2 IMPLANT
GLOVE SURG LX STRL 8.0 MICRO (GLOVE) ×2 IMPLANT
GLOVE SURG SYN 6.5 PF PI BL (GLOVE) ×2 IMPLANT
LENS IOL TECNIS EYHANCE 23.0 (Intraocular Lens) IMPLANT
NDL FILTER BLUNT 18X1 1/2 (NEEDLE) ×2 IMPLANT
NEEDLE FILTER BLUNT 18X1 1/2 (NEEDLE) ×1 IMPLANT
SYR 3ML LL SCALE MARK (SYRINGE) ×2 IMPLANT

## 2024-02-12 NOTE — Transfer of Care (Signed)
 Immediate Anesthesia Transfer of Care Note  Patient: Aaron Jones  Procedure(s) Performed: PHACOEMULSIFICATION, CATARACT, WITH IOL INSERTION 25.50 01:56.1 (Left: Eye)  Patient Location: PACU  Anesthesia Type: MAC  Level of Consciousness: awake, alert  and patient cooperative  Airway and Oxygen Therapy: Patient Spontanous Breathing and Patient connected to supplemental oxygen  Post-op Assessment: Post-op Vital signs reviewed, Patient's Cardiovascular Status Stable, Respiratory Function Stable, Patent Airway and No signs of Nausea or vomiting  Post-op Vital Signs: Reviewed and stable  Complications: No notable events documented.

## 2024-02-12 NOTE — Anesthesia Postprocedure Evaluation (Signed)
 Anesthesia Post Note  Patient: Aaron Jones  Procedure(s) Performed: PHACOEMULSIFICATION, CATARACT, WITH IOL INSERTION 25.50 01:56.1 (Left: Eye)  Patient location during evaluation: PACU Anesthesia Type: MAC Level of consciousness: awake and alert Pain management: pain level controlled Vital Signs Assessment: post-procedure vital signs reviewed and stable Respiratory status: spontaneous breathing, nonlabored ventilation, respiratory function stable and patient connected to nasal cannula oxygen Cardiovascular status: stable and blood pressure returned to baseline Postop Assessment: no apparent nausea or vomiting Anesthetic complications: no   No notable events documented.   Last Vitals:  Vitals:   02/12/24 0807 02/12/24 0810  BP: (!) 149/75   Pulse: 67 68  Resp: 11 19  Temp:    SpO2: 98% 97%    Last Pain:  Vitals:   02/12/24 0810  PainSc: 0-No pain                 Marcel Sorter C Onalee Steinbach

## 2024-02-12 NOTE — H&P (Signed)
 [facility]   Primary Care Physician:  Rudolpho Norleen BIRCH, MD Ophthalmologist: Dr. Jaye  Pre-Procedure History & Physical: HPI:  Aaron Jones is a 78 y.o. male here for cataract surgery.   Past Medical History:  Diagnosis Date   Atrial fibrillation with rapid ventricular response (HCC) 01/16/2013   a. 01/2013 s/p R & L Maze and LAA clipping in setting of VSD repair;  b. Prev on amiodarone ;  c. CHA2DS2VASc = 4-->Apixaban ;  c. 01/2013 Recurrent afib/flutter requiring DCCV.   CAD (coronary artery disease)    CHF (congestive heart failure) (HCC)    Cognitive and behavioral changes    Colon polyp    COPD (chronic obstructive pulmonary disease) (HCC)    Coronary artery disease    a. 01/2013 s/p CABG x 1 (VG->PDA) in setting of VSD repair.   GERD (gastroesophageal reflux disease)    Hearing loss    Hx of CABG    Hyperlipidemia    Hypertension    PCP- [facility] - [place], phone; [phone]   Hypertrophic cardiomyopathy (HCC)    Left to right cardiovascular shunt    Loss of memory    Mitral valve disorder    Moderate aortic stenosis by prior echocardiogram    Moderate pulmonary hypertension (HCC)    Moderate to severe mitral regurgitation    Obesity    Paroxysmal atrial fibrillation (HCC)    PAT (paroxysmal atrial tachycardia) (HCC)    a. 02/2015 s/p DCCV;  b. 12/2016 recurrent PAT - unchanged with escalating BB dose (rates 106-107).   PSVT (paroxysmal supraventricular tachycardia) (HCC)    a. 10/2011 Admitted to [facility] w/ SVT-->broke with IV dilt.   Recurrent Nephrolithiasis    S/P Maze operation for atrial fibrillation    S/P VSD closure    Schatzki's ring    Sleep apnea    [facility]- in process of being evaluated now, states 12 yrs. ago was on CPAP but after having his deviated septum repaired, he had put the CPAP in storage. Pt. will get a new machine soon.    Thoracic aortic aneurysm (HCC)    a. 01/2016 CTA chest: 4.0 cm Asc Ao; b. 01/2017 Echo: nl Ao root size.   Type II  diabetes mellitus (HCC)    Umbilical hernia    not repaired (01/16/2013)   Unsteady gait    VSD (ventricular septal defect) w/ persistent systolic murmur    a. 01/2013 s/p VSD closure (dacron patch);  b. 01/2017 Echo: EF 50-55%, no rwma, mild AS, mild MR, mod dil LA, dilated RV, sev dil RA, mild TR. PASP nl.    Past Surgical History:  Procedure Laterality Date   CARDIAC CATHETERIZATION  > 5 yr ago   Done at [facility], reportedly clean   CARDIAC CATHETERIZATION  11/03/2012   CARDIOVERSION N/A 02/12/2015   Procedure: CARDIOVERSION;  Surgeon: Aleene JINNY Passe, MD;  Location: [facility] ENDOSCOPY;  Service: Cardiovascular;  Laterality: N/A;   CARDIOVERSION N/A 04/27/2017   Procedure: CARDIOVERSION;  Surgeon: Waddell Danelle ORN, MD;  Location: [facility] INVASIVE CV LAB;  Service: Cardiovascular;  Laterality: N/A;   CLIPPING OF ATRIAL APPENDAGE N/A 01/07/2013   Procedure: CLIPPING OF ATRIAL APPENDAGE;  Surgeon: Dallas KATHEE Jude, MD;  Location: [facility] OR;  Service: Open Heart Surgery;  Laterality: N/A;   COLONOSCOPY W/ POLYPECTOMY     COLONOSCOPY WITH PROPOFOL  N/A 01/05/2021   Procedure: COLONOSCOPY WITH PROPOFOL ;  Surgeon: Toledo, Ladell POUR, MD;  Location: [facility] ENDOSCOPY;  Service: Gastroenterology;  Laterality: N/A;  CORONARY ARTERY BYPASS GRAFT N/A 01/07/2013   Procedure: CORONARY ARTERY BYPASS GRAFTING (CABG);  Surgeon: Dallas KATHEE Jude, MD;  Location: [facility] OR;  Service: Open Heart Surgery;  Laterality: N/A;  Times1 using endoscopically harvested saphenous vein graft to the PDA   ESOPHAGOGASTRODUODENOSCOPY N/A 01/05/2021   Procedure: ESOPHAGOGASTRODUODENOSCOPY (EGD);  Surgeon: Toledo, Ladell POUR, MD;  Location: [facility] ENDOSCOPY;  Service: Gastroenterology;  Laterality: N/A;  DM   EYE SURGERY Left 02/03/1989   clipped muscle so it wouldn't be pulling up (01/16/2013)   INTRAOPERATIVE TRANSESOPHAGEAL ECHOCARDIOGRAM N/A 01/07/2013   Procedure: INTRAOPERATIVE TRANSESOPHAGEAL ECHOCARDIOGRAM;  Surgeon: Dallas KATHEE Jude, MD;   Location: [facility] OR;  Service: Open Heart Surgery;  Laterality: N/A;   LEFT AND RIGHT HEART CATHETERIZATION WITH CORONARY ANGIOGRAM N/A 11/20/2012   Procedure: LEFT AND RIGHT HEART CATHETERIZATION WITH CORONARY ANGIOGRAM;  Surgeon: Toribio JONELLE Fuel, MD;  Location: [facility] CATH LAB;  Service: Cardiovascular;  Laterality: N/A;   MAZE N/A 01/07/2013   Procedure: MAZE;  Surgeon: Dallas KATHEE Jude, MD;  Location: [facility] OR;  Service: Open Heart Surgery;  Laterality: N/A;   MULTIPLE EXTRACTIONS WITH ALVEOLOPLASTY N/A 12/11/2012   Procedure: Extraction of tooth #'s 2,3,4,5,14,15,17,21,22,23,24,25,26,27,32 wioth alveoloplasty and bialteral fibrous tuberosity reductions.;  Surgeon: Tanda JULIANNA Fanny, DDS;  Location: [facility] ORS;  Service: Oral Surgery;  Laterality: N/A;   NASAL SEPTUM SURGERY  06/05/2002   RIGHT/LEFT HEART CATH AND CORONARY ANGIOGRAPHY N/A 08/02/2021   Procedure: RIGHT/LEFT HEART CATH AND CORONARY ANGIOGRAPHY;  Surgeon: Mady Bruckner, MD;  Location: [facility] INVASIVE CV LAB;  Service: Cardiovascular;  Laterality: N/A;   sepfal deveation repair     TEE WITHOUT CARDIOVERSION N/A 11/21/2012   Procedure: TRANSESOPHAGEAL ECHOCARDIOGRAM (TEE);  Surgeon: Toribio JONELLE Fuel, MD;  Location: [facility] ENDOSCOPY;  Service: Cardiovascular;  Laterality: N/A;   VSD REPAIR N/A 01/07/2013   Procedure: VENTRICULAR SEPTAL DEFECT (VSD) REPAIR;  Surgeon: Dallas KATHEE Jude, MD;  Location: [facility] OR;  Service: Open Heart Surgery;  Laterality: N/A;   VSD REPAIR      Prior to Admission medications   Medication Sig Start Date End Date Taking? Authorizing Provider  amiodarone  (PACERONE ) 200 MG tablet Take 1 tablet (200 mg total) by mouth daily. 11/30/21  Yes Furth, Cadence H, PA-C  amLODipine  (NORVASC ) 2.5 MG tablet Take by mouth daily. 08/01/23 07/31/24 Yes [provider]  apixaban  (ELIQUIS ) 5 MG TABS tablet Take 1 tablet (5 mg total) by mouth 2 (two) times daily. 08/21/23  Yes Dunn, Bernardino HERO, PA-C  atorvastatin  (LIPITOR ) 20 MG tablet  Take 1 tablet (20 mg total) by mouth at bedtime. 01/06/19  Yes Vivienne Bruckner Tanda, NP  furosemide  (LASIX ) 40 MG tablet Take 0.5 tablets (20 mg total) by mouth daily. 09/08/22  Yes Dunn, Ryan M, PA-C  glipiZIDE (GLUCOTROL) 5 MG tablet Take by mouth daily before breakfast.   Yes [provider]  Glucose Blood (BLOOD GLUCOSE TEST STRIPS) STRP 1 strip by In Vitro route 2 (two) times daily. 11/25/12  Yes Pietro Redell RAMAN, MD  isosorbide  mononitrate (IMDUR ) 30 MG 24 hr tablet Take 0.5 tablets (15 mg total) by mouth daily. 08/02/21  Yes End, Bruckner, MD  losartan  (COZAAR ) 100 MG tablet Take 1 tablet (100 mg total) by mouth daily. 12/06/23 03/05/24 Yes Dunn, Bernardino HERO, PA-C  metFORMIN  (GLUCOPHAGE ) 1000 MG tablet Take 1,000 mg by mouth 2 (two) times daily. 01/16/15  Yes [provider]  metoprolol  succinate (TOPROL -XL) 100 MG 24 hr tablet Take 1 tablet (100 mg total) by mouth daily.  Take with or immediately following a meal. 04/06/23  Yes Dunn, Bernardino HERO, PA-C  omeprazole (PRILOSEC) 40 MG capsule Take 40 mg by mouth daily before breakfast.    Yes [provider]  sertraline (ZOLOFT) 50 MG tablet Take 50 mg by mouth daily. 03/04/19  Yes [provider]    Allergies as of 02/01/2024 - Review Complete 08/21/2023  Allergen Reaction Noted   Biaxin [clarithromycin] Nausea Only 11/17/2012    Family History  Problem Relation Age of Onset   Cancer Mother    Diabetes Mother     Social History   Socioeconomic History   Marital status: Married    Spouse name: Not on file   Number of children: Not on file   Years of education: Not on file   Highest education level: Not on file  Occupational History   Occupation: Manufacturing    Comment: Heavy work at times  Tobacco Use   Smoking status: Never    Passive exposure: Never   Smokeless tobacco: Never  Vaping Use   Vaping status: Never Used  Substance and Sexual Activity   Alcohol  use: No   Drug use: No   Sexual  activity: Not Currently  Other Topics Concern   Not on file  Social History Narrative   Still works full time. Works around the yard. Lives with wife. Has 2 sisters, neither with cardiac issues.   Social Drivers of Corporate investment banker Strain: Not on file  Food Insecurity: Not on file  Transportation Needs: Not on file  Physical Activity: Not on file  Stress: Not on file  Social Connections: Not on file  Intimate Partner Violence: Not on file    Review of Systems: See HPI, otherwise negative ROS  Physical Exam: BP (!) 153/73   Pulse 69   Temp (!) 97.2 F (36.2 C)   Ht 6' (1.829 m)   Wt 89.2 kg   SpO2 98%   BMI 26.68 kg/m  General:   Alert, cooperative. Head:  Normocephalic and atraumatic. Respiratory:  Normal work of breathing. Cardiovascular:  NAD  Impression/Plan: Aaron Jones is here for cataract surgery.  Risks, benefits, limitations, and alternatives regarding cataract surgery have been reviewed with the patient.  Questions have been answered.  All parties agreeable.   Elsie Carmine, MD  02/12/2024, 7:39 AM

## 2024-02-12 NOTE — Op Note (Signed)
 PREOPERATIVE DIAGNOSIS:  Nuclear sclerotic cataract of the left eye.   POSTOPERATIVE DIAGNOSIS:  Nuclear sclerotic cataract of the left eye.   OPERATIVE PROCEDURE:ORPROCALL@   SURGEON:  Elsie Carmine, MD.   ANESTHESIA:  Anesthesiologist: Ola Donny BROCKS, MD CRNA: Myra Lawless, CRNA  1.      Managed anesthesia care. 2.     0.65ml of Shugarcaine was instilled following the paracentesis   COMPLICATIONS:  None.   TECHNIQUE:   Stop and chop   DESCRIPTION OF PROCEDURE:  The patient was examined and consented in the preoperative holding area where the aforementioned topical anesthesia was applied to the left eye and then brought back to the Operating Room where the left eye was prepped and draped in the usual sterile ophthalmic fashion and a lid speculum was placed. A paracentesis was created with the side port blade and the anterior chamber was filled with viscoelastic. A near clear corneal incision was performed with the steel keratome. A continuous curvilinear capsulorrhexis was performed with a cystotome followed by the capsulorrhexis forceps. Hydrodissection and hydrodelineation were carried out with BSS on a blunt cannula. The lens was removed in a stop and chop  technique and the remaining cortical material was removed with the irrigation-aspiration handpiece. The capsular bag was inflated with viscoelastic and the Technis [id number] lens was placed in the capsular bag without complication. The remaining viscoelastic was removed from the eye with the irrigation-aspiration handpiece. The wounds were hydrated. The anterior chamber was flushed with BSS and the eye was inflated to physiologic pressure. 0.62ml Vigamox  was placed in the anterior chamber. The wounds were found to be water tight. The eye was dressed with Combigan . The patient was given protective glasses to wear throughout the day and a shield with which to sleep tonight. The patient was also given drops with which to begin a drop regimen  today and will follow-up with me in one day. Implant Name Type Inv. Item Serial No. Manufacturer Lot No. LRB No. Used Action  LENS IOL TECNIS EYHANCE 23.0 - [id number] Intraocular Lens LENS IOL TECNIS EYHANCE 23.0 [id number] [employer]  Left 1 Implanted    Procedure(s): PHACOEMULSIFICATION, CATARACT, WITH IOL INSERTION 25.50 01:56.1 (Left)  Electronically signed: Elsie Carmine 02/12/2024 8:04 AM

## 2024-02-18 ENCOUNTER — Ambulatory Visit: Admitting: Physician Assistant

## 2024-02-18 NOTE — Anesthesia Preprocedure Evaluation (Addendum)
 Anesthesia Evaluation  Patient identified by MRN, date of birth, ID band Patient awake    Reviewed: Allergy & Precautions, NPO status , Patient's Chart, lab work & pertinent test results  History of Anesthesia Complications Negative for: history of anesthetic complications  Airway Mallampati: III  TM Distance: <3 FB Neck ROM: full    Dental  (+) Missing   Pulmonary sleep apnea , COPD   Pulmonary exam normal        Cardiovascular hypertension, (-) angina + CAD  Normal cardiovascular exam+ Valvular Problems/Murmurs      Neuro/Psych negative neurological ROS  negative psych ROS   GI/Hepatic Neg liver ROS,GERD  Controlled,,  Endo/Other  diabetes    Renal/GU Renal disease     Musculoskeletal   Abdominal   Peds  Hematology negative hematology ROS (+)   Anesthesia Other Findings Past Medical History: 01/16/2013: Atrial fibrillation with rapid ventricular response (HCC)     Comment:  a. 01/2013 s/p R & L Maze and LAA clipping in setting of               VSD repair;  b. Prev on amiodarone ;  c. CHA2DS2VASc =               4-->Apixaban ;  c. 01/2013 Recurrent afib/flutter requiring              DCCV. No date: CAD (coronary artery disease) No date: CHF (congestive heart failure) (HCC) No date: Cognitive and behavioral changes No date: Colon polyp No date: COPD (chronic obstructive pulmonary disease) (HCC) No date: Coronary artery disease     Comment:  a. 01/2013 s/p CABG x 1 (VG->PDA) in setting of VSD               repair. No date: Edentulous No date: GERD (gastroesophageal reflux disease) No date: Hearing loss No date: Hx of CABG No date: Hyperlipidemia No date: Hypertension     Comment:  PCP- [facility] Walker - [place], phone; [phone] No date: Hypertrophic cardiomyopathy (HCC) No date: Left to right cardiovascular shunt No date: Loss of memory No date: Mitral valve disorder No date: Moderate aortic stenosis by prior  echocardiogram No date: Moderate pulmonary hypertension (HCC) No date: Moderate to severe mitral regurgitation No date: Obesity No date: Paroxysmal atrial fibrillation (HCC) No date: PAT (paroxysmal atrial tachycardia)     Comment:  a. 02/2015 s/p DCCV;  b. 12/2016 recurrent PAT - unchanged              with escalating BB dose (rates 106-107). No date: PSVT (paroxysmal supraventricular tachycardia)     Comment:  a. 10/2011 Admitted to [facility] w/ SVT-->broke with IV dilt. No date: Recurrent Nephrolithiasis No date: S/P Maze operation for atrial fibrillation No date: S/P VSD closure No date: Schatzki's ring No date: Sleep apnea     Comment:  [facility]- in process of being evaluated now, states 12 yrs.               ago was on CPAP but after having his deviated septum               repaired, he had put the CPAP in storage. Pt. will get a               new machine soon.  No date: Thoracic aortic aneurysm     Comment:  a. 01/2016 CTA chest: 4.0 cm Asc Ao; b. 01/2017 Echo: nl  Ao root size. No date: Type II diabetes mellitus (HCC) No date: Umbilical hernia     Comment:  not repaired (01/16/2013) No date: Unsteady gait No date: VSD (ventricular septal defect) w/ persistent systolic murmur     Comment:  a. 01/2013 s/p VSD closure (dacron patch);  b. 01/2017               Echo: EF 50-55%, no rwma, mild AS, mild MR, mod dil LA,               dilated RV, sev dil RA, mild TR. PASP nl.  Past Surgical History: > 5 yr ago: CARDIAC CATHETERIZATION     Comment:  Done at [facility] reportedly clean 11/03/2012: CARDIAC CATHETERIZATION 02/12/2015: CARDIOVERSION; N/A     Comment:  Procedure: CARDIOVERSION;  Surgeon: Aleene JINNY Passe, MD;              Location: [facility] ENDOSCOPY;  Service: Cardiovascular;                Laterality: N/A; 04/27/2017: CARDIOVERSION; N/A     Comment:  Procedure: CARDIOVERSION;  Surgeon: Waddell Danelle ORN, MD;              Location: [facility] INVASIVE CV LAB;  Service: Cardiovascular;                 Laterality: N/A; 02/12/2024: CATARACT EXTRACTION W/PHACO; Left     Comment:  Procedure: PHACOEMULSIFICATION, CATARACT, WITH IOL               INSERTION 25.50 01:56.1;  Surgeon: Jaye Fallow, MD;              Location: [facility] SURGERY CNTR;  Service: Ophthalmology;                Laterality: Left; 01/07/2013: CLIPPING OF ATRIAL APPENDAGE; N/A     Comment:  Procedure: CLIPPING OF ATRIAL APPENDAGE;  Surgeon:               Dallas KATHEE Jude, MD;  Location: [facility] OR;  Service: Open               Heart Surgery;  Laterality: N/A; No date: COLONOSCOPY W/ POLYPECTOMY 01/05/2021: COLONOSCOPY WITH PROPOFOL ; N/A     Comment:  Procedure: COLONOSCOPY WITH PROPOFOL ;  Surgeon: [place],               Ladell POUR, MD;  Location: [facility] ENDOSCOPY;  Service:               Gastroenterology;  Laterality: N/A; 01/07/2013: CORONARY ARTERY BYPASS GRAFT; N/A     Comment:  Procedure: CORONARY ARTERY BYPASS GRAFTING (CABG);                Surgeon: Dallas KATHEE Jude, MD;  Location: [facility] OR;                Service: Open Heart Surgery;  Laterality: N/A;  Times1               using endoscopically harvested saphenous vein graft to               the PDA 01/05/2021: ESOPHAGOGASTRODUODENOSCOPY; N/A     Comment:  Procedure: ESOPHAGOGASTRODUODENOSCOPY (EGD);  Surgeon:               Toledo, Ladell POUR, MD;  Location: [facility] ENDOSCOPY;                Service: Gastroenterology;  Laterality: N/A;  DM 02/03/1989: EYE  SURGERY; Left     Comment:  clipped muscle so it wouldn't be pulling up               (01/16/2013) 01/07/2013: INTRAOPERATIVE TRANSESOPHAGEAL ECHOCARDIOGRAM; N/A     Comment:  Procedure: INTRAOPERATIVE TRANSESOPHAGEAL               ECHOCARDIOGRAM;  Surgeon: Dallas KATHEE Jude, MD;                Location: [facility] OR;  Service: Open Heart Surgery;                Laterality: N/A; 11/20/2012: LEFT AND RIGHT HEART CATHETERIZATION WITH CORONARY  ANGIOGRAM; N/A     Comment:  Procedure: LEFT AND RIGHT HEART CATHETERIZATION  WITH               CORONARY ANGIOGRAM;  Surgeon: Toribio JONELLE Fuel, MD;                Location: [facility] CATH LAB;  Service: Cardiovascular;                Laterality: N/A; 01/07/2013: MAZE; N/A     Comment:  Procedure: MAZE;  Surgeon: Dallas KATHEE Jude, MD;                Location: [facility] OR;  Service: Open Heart Surgery;                Laterality: N/A; 12/11/2012: MULTIPLE EXTRACTIONS WITH ALVEOLOPLASTY; N/A     Comment:  Procedure: Extraction of tooth #'s               2,3,4,5,14,15,17,21,22,23,24,25,26,27,32 wioth               alveoloplasty and bialteral fibrous tuberosity               reductions.;  Surgeon: Tanda JULIANNA Fanny, DDS;  Location:              [facility] ORS;  Service: Oral Surgery;  Laterality: N/A; 06/05/2002: NASAL SEPTUM SURGERY 08/02/2021: RIGHT/LEFT HEART CATH AND CORONARY ANGIOGRAPHY; N/A     Comment:  Procedure: RIGHT/LEFT HEART CATH AND CORONARY               ANGIOGRAPHY;  Surgeon: Mady Bruckner, MD;  Location:               [facility] INVASIVE CV LAB;  Service: Cardiovascular;                Laterality: N/A; No date: sepfal deveation repair 11/21/2012: TEE WITHOUT CARDIOVERSION; N/A     Comment:  Procedure: TRANSESOPHAGEAL ECHOCARDIOGRAM (TEE);                Surgeon: Toribio JONELLE Fuel, MD;  Location: [facility] ENDOSCOPY;              Service: Cardiovascular;  Laterality: N/A; 01/07/2013: VSD REPAIR; N/A     Comment:  Procedure: VENTRICULAR SEPTAL DEFECT (VSD) REPAIR;                Surgeon: Dallas KATHEE Jude, MD;  Location: [facility] OR;                Service: Open Heart Surgery;  Laterality: N/A; No date: VSD REPAIR  BMI    Body Mass Index: 25.63 kg/m      Reproductive/Obstetrics negative OB ROS  Anesthesia Physical Anesthesia Plan  ASA: 3  Anesthesia Plan: MAC   Post-op Pain Management:    Induction: Intravenous  PONV Risk Score and Plan:   Airway Management Planned: Natural Airway and Nasal Cannula  Additional  Equipment:   Intra-op Plan:   Post-operative Plan:   Informed Consent: I have reviewed the patients History and Physical, chart, labs and discussed the procedure including the risks, benefits and alternatives for the proposed anesthesia with the patient or authorized representative who has indicated his/her understanding and acceptance.     Dental Advisory Given  Plan Discussed with: Anesthesiologist, CRNA and Surgeon  Anesthesia Plan Comments: (Patient consented for risks of anesthesia including but not limited to:  - adverse reactions to medications - damage to eyes, teeth, lips or other oral mucosa - nerve damage due to positioning  - sore throat or hoarseness - Damage to heart, brain, nerves, lungs, other parts of body or loss of life  Patient voiced understanding and assent.)         Anesthesia Quick Evaluation

## 2024-02-21 NOTE — Discharge Instructions (Signed)

## 2024-02-26 ENCOUNTER — Ambulatory Visit: Payer: Self-pay | Admitting: Anesthesiology

## 2024-02-26 ENCOUNTER — Encounter: Payer: Self-pay | Admitting: Ophthalmology

## 2024-02-26 ENCOUNTER — Ambulatory Visit
Admission: RE | Admit: 2024-02-26 | Discharge: 2024-02-26 | Disposition: A | Discharge status: Home / Self Care | Attending: Ophthalmology | Admitting: Ophthalmology

## 2024-02-26 ENCOUNTER — Encounter
Admission: RE | Disposition: A | Discharge status: Home / Self Care | Payer: Self-pay | Source: Home / Self Care | Attending: Ophthalmology

## 2024-02-26 ENCOUNTER — Other Ambulatory Visit: Payer: Self-pay

## 2024-02-26 DIAGNOSIS — G4730 Sleep apnea, unspecified: Secondary | ICD-10-CM | POA: Insufficient documentation

## 2024-02-26 DIAGNOSIS — E1136 Type 2 diabetes mellitus with diabetic cataract: Secondary | ICD-10-CM | POA: Diagnosis not present

## 2024-02-26 DIAGNOSIS — Z7984 Long term (current) use of oral hypoglycemic drugs: Secondary | ICD-10-CM | POA: Insufficient documentation

## 2024-02-26 DIAGNOSIS — H2511 Age-related nuclear cataract, right eye: Secondary | ICD-10-CM | POA: Insufficient documentation

## 2024-02-26 DIAGNOSIS — I110 Hypertensive heart disease with heart failure: Secondary | ICD-10-CM | POA: Insufficient documentation

## 2024-02-26 DIAGNOSIS — I2510 Atherosclerotic heart disease of native coronary artery without angina pectoris: Secondary | ICD-10-CM | POA: Insufficient documentation

## 2024-02-26 DIAGNOSIS — Z8774 Personal history of (corrected) congenital malformations of heart and circulatory system: Secondary | ICD-10-CM | POA: Insufficient documentation

## 2024-02-26 DIAGNOSIS — J449 Chronic obstructive pulmonary disease, unspecified: Secondary | ICD-10-CM | POA: Diagnosis not present

## 2024-02-26 DIAGNOSIS — K219 Gastro-esophageal reflux disease without esophagitis: Secondary | ICD-10-CM | POA: Insufficient documentation

## 2024-02-26 HISTORY — PX: CATARACT EXTRACTION W/PHACO: SHX586

## 2024-02-26 LAB — GLUCOSE, CAPILLARY: Glucose-Capillary: 90 mg/dL (ref 70–99)

## 2024-02-26 SURGERY — PHACOEMULSIFICATION, CATARACT, WITH IOL INSERTION
Anesthesia: Monitor Anesthesia Care | Site: Eye | Laterality: Right

## 2024-02-26 MED ORDER — ARMC OPHTHALMIC DILATING DROPS
OPHTHALMIC | Status: AC
  Filled 2024-02-26: qty 0.5

## 2024-02-26 MED ORDER — LACTATED RINGERS IV SOLN: INTRAVENOUS | Status: DC

## 2024-02-26 MED ORDER — SIGHTPATH DOSE#1 BSS IO SOLN
INTRAOCULAR | Status: DC | PRN
  Administered 2024-02-26: 15 mL via INTRAOCULAR

## 2024-02-26 MED ORDER — TETRACAINE HCL 0.5 % OP SOLN
1.0000 [drp] | OPHTHALMIC | Status: DC | PRN
  Administered 2024-02-26 (×3): 1 [drp] via OPHTHALMIC

## 2024-02-26 MED ORDER — ARMC OPHTHALMIC DILATING DROPS
1.0000 | OPHTHALMIC | Status: DC | PRN
  Administered 2024-02-26 (×3): 1 via OPHTHALMIC

## 2024-02-26 MED ORDER — TETRACAINE HCL 0.5 % OP SOLN
OPHTHALMIC | Status: AC
  Filled 2024-02-26: qty 4

## 2024-02-26 MED ORDER — DEXMEDETOMIDINE HCL IN NACL 80 MCG/20ML IV SOLN
INTRAVENOUS | Status: AC
Start: 2024-02-26 — End: 2024-02-26
  Filled 2024-02-26: qty 20

## 2024-02-26 MED ORDER — SIGHTPATH DOSE#1 NA CHONDROIT SULF-NA HYALURON 40-17 MG/ML IO SOLN
INTRAOCULAR | Status: DC | PRN
  Administered 2024-02-26: 1 mL via INTRAOCULAR

## 2024-02-26 MED ORDER — MIDAZOLAM HCL 2 MG/2ML IJ SOLN
INTRAMUSCULAR | Status: DC | PRN
  Administered 2024-02-26: 2 mg via INTRAVENOUS

## 2024-02-26 MED ORDER — MIDAZOLAM HCL 2 MG/2ML IJ SOLN
INTRAMUSCULAR | Status: AC
  Filled 2024-02-26: qty 2

## 2024-02-26 MED ORDER — BRIMONIDINE TARTRATE-TIMOLOL 0.2-0.5 % OP SOLN
OPHTHALMIC | Status: DC | PRN
  Administered 2024-02-26: 1 [drp] via OPHTHALMIC

## 2024-02-26 MED ORDER — MOXIFLOXACIN HCL 0.5 % OP SOLN
OPHTHALMIC | Status: DC | PRN
  Administered 2024-02-26: .2 mL via OPHTHALMIC

## 2024-02-26 MED ORDER — SIGHTPATH DOSE#1 BSS IO SOLN
INTRAOCULAR | Status: DC | PRN
  Administered 2024-02-26: 56 mL via OPHTHALMIC

## 2024-02-26 MED ORDER — LIDOCAINE HCL (PF) 2 % IJ SOLN
INTRAOCULAR | Status: DC | PRN
  Administered 2024-02-26: 2 mL

## 2024-02-26 SURGICAL SUPPLY — 10 items
CANNULA ANT/CHMB 27G (MISCELLANEOUS) ×2 IMPLANT
CYSTOTOME ANGL RVRS SHRT 25G (CUTTER) ×2 IMPLANT
FEE CATARACT SUITE SIGHTPATH (MISCELLANEOUS) ×2 IMPLANT
GLOVE BIOGEL PI IND STRL 8 (GLOVE) ×2 IMPLANT
GLOVE SURG LX STRL 8.0 MICRO (GLOVE) ×2 IMPLANT
GLOVE SURG SYN 6.5 PF PI BL (GLOVE) ×2 IMPLANT
LENS IOL TECNIS EYHANCE 23.5 (Intraocular Lens) IMPLANT
NDL FILTER BLUNT 18X1 1/2 (NEEDLE) ×2 IMPLANT
NEEDLE FILTER BLUNT 18X1 1/2 (NEEDLE) ×1 IMPLANT
SYR 3ML LL SCALE MARK (SYRINGE) ×2 IMPLANT

## 2024-02-26 NOTE — Transfer of Care (Signed)
 Immediate Anesthesia Transfer of Care Note  Patient: Aaron Jones  Procedure(s) Performed: PHACOEMULSIFICATION, CATARACT, WITH IOL INSERTION 8.41 00:56.6 (Right: Eye)  Patient Location: PACU  Anesthesia Type: MAC  Level of Consciousness: awake, alert  and patient cooperative  Airway and Oxygen Therapy: Patient Spontanous Breathing and Patient connected to supplemental oxygen  Post-op Assessment: Post-op Vital signs reviewed, Patient's Cardiovascular Status Stable, Respiratory Function Stable, Patent Airway and No signs of Nausea or vomiting  Post-op Vital Signs: Reviewed and stable  Complications: No notable events documented.

## 2024-02-26 NOTE — Op Note (Signed)
 PREOPERATIVE DIAGNOSIS:  Nuclear sclerotic cataract of the right eye.   POSTOPERATIVE DIAGNOSIS:  Right Eye Cataract   OPERATIVE PROCEDURE:ORPROCALL@   SURGEON:  Elsie Carmine, MD.   ANESTHESIA:  Anesthesiologist: Piscitello, Fairy POUR, MD CRNA: Dave Maus, CRNA  1.      Managed anesthesia care. 2.      0.48ml of Shugarcaine was instilled in the eye following the paracentesis.   COMPLICATIONS:  None.   TECHNIQUE:   Stop and chop   DESCRIPTION OF PROCEDURE:  The patient was examined and consented in the preoperative holding area where the aforementioned topical anesthesia was applied to the right eye and then brought back to the Operating Room where the right eye was prepped and draped in the usual sterile ophthalmic fashion and a lid speculum was placed. A paracentesis was created with the side port blade and the anterior chamber was filled with viscoelastic. A near clear corneal incision was performed with the steel keratome. A continuous curvilinear capsulorrhexis was performed with a cystotome followed by the capsulorrhexis forceps. Hydrodissection and hydrodelineation were carried out with BSS on a blunt cannula. The lens was removed in a stop and chop  technique and the remaining cortical material was removed with the irrigation-aspiration handpiece. The capsular bag was inflated with viscoelastic and the intraocular lens was placed in the capsular bag without complication. The remaining viscoelastic was removed from the eye with the irrigation-aspiration handpiece. The wounds were hydrated. The anterior chamber was flushed with BSS and the eye was inflated to physiologic pressure. 0.47ml of Vigamox  was placed in the anterior chamber. The wounds were found to be water tight. The eye was dressed with Combigan . The patient was given protective glasses to wear throughout the day and a shield with which to sleep tonight. The patient was also given drops with which to begin a drop regimen  today and will follow-up with me in one day. Implant Name Type Inv. Item Serial No. Manufacturer Lot No. LRB No. Used Action  LENS IOL TECNIS EYHANCE 23.5 - [id number] Intraocular Lens LENS IOL TECNIS EYHANCE 23.5 [id number] [employer]  Right 1 Implanted   Procedure(s): PHACOEMULSIFICATION, CATARACT, WITH IOL INSERTION 8.41 00:56.6 (Right)  Electronically signed: Elsie Carmine 02/26/2024 7:46 AM

## 2024-02-26 NOTE — Anesthesia Postprocedure Evaluation (Signed)
 Anesthesia Post Note  Patient: Aaron Jones  Procedure(s) Performed: PHACOEMULSIFICATION, CATARACT, WITH IOL INSERTION 8.41 00:56.6 (Right: Eye)  Patient location during evaluation: PACU Anesthesia Type: MAC Level of consciousness: awake and alert Pain management: pain level controlled Vital Signs Assessment: post-procedure vital signs reviewed and stable Respiratory status: spontaneous breathing, nonlabored ventilation and respiratory function stable Cardiovascular status: blood pressure returned to baseline and stable Postop Assessment: no apparent nausea or vomiting Anesthetic complications: no   No notable events documented.   Last Vitals:  Vitals:   02/26/24 0748 02/26/24 0752  BP: 112/64 113/78  Pulse: 72 73  Resp: 12 (!) 21  Temp: (!) 36.3 C (!) 36.3 C  SpO2: 97% 97%    Last Pain:  Vitals:   02/26/24 0752  PainSc: 0-No pain                 Fairy POUR Dillard Pascal

## 2024-02-26 NOTE — H&P (Signed)
 [facility]   Primary Care Physician:  Rudolpho Norleen BIRCH, MD Ophthalmologist: Dr. Elsie Carmine  Pre-Procedure History & Physical: HPI:  Aaron Jones is a 78 y.o. male here for cataract surgery.   Past Medical History:  Diagnosis Date   Atrial fibrillation with rapid ventricular response (HCC) 01/16/2013   a. 01/2013 s/p R & L Maze and LAA clipping in setting of VSD repair;  b. Prev on amiodarone ;  c. CHA2DS2VASc = 4-->Apixaban ;  c. 01/2013 Recurrent afib/flutter requiring DCCV.   CAD (coronary artery disease)    CHF (congestive heart failure) (HCC)    Cognitive and behavioral changes    Colon polyp    COPD (chronic obstructive pulmonary disease) (HCC)    Coronary artery disease    a. 01/2013 s/p CABG x 1 (VG->PDA) in setting of VSD repair.   Edentulous    GERD (gastroesophageal reflux disease)    Hearing loss    Hx of CABG    Hyperlipidemia    Hypertension    PCP- [facility] - [place], phone; [phone]   Hypertrophic cardiomyopathy (HCC)    Left to right cardiovascular shunt    Loss of memory    Mitral valve disorder    Moderate aortic stenosis by prior echocardiogram    Moderate pulmonary hypertension (HCC)    Moderate to severe mitral regurgitation    Obesity    Paroxysmal atrial fibrillation (HCC)    PAT (paroxysmal atrial tachycardia)    a. 02/2015 s/p DCCV;  b. 12/2016 recurrent PAT - unchanged with escalating BB dose (rates 106-107).   PSVT (paroxysmal supraventricular tachycardia)    a. 10/2011 Admitted to [facility] w/ SVT-->broke with IV dilt.   Recurrent Nephrolithiasis    S/P Maze operation for atrial fibrillation    S/P VSD closure    Schatzki's ring    Sleep apnea    [facility]- in process of being evaluated now, states 12 yrs. ago was on CPAP but after having his deviated septum repaired, he had put the CPAP in storage. Pt. will get a new machine soon.    Thoracic aortic aneurysm    a. 01/2016 CTA chest: 4.0 cm Asc Ao; b. 01/2017 Echo: nl Ao root size.   Type  II diabetes mellitus (HCC)    Umbilical hernia    not repaired (01/16/2013)   Unsteady gait    VSD (ventricular septal defect) w/ persistent systolic murmur    a. 01/2013 s/p VSD closure (dacron patch);  b. 01/2017 Echo: EF 50-55%, no rwma, mild AS, mild MR, mod dil LA, dilated RV, sev dil RA, mild TR. PASP nl.    Past Surgical History:  Procedure Laterality Date   CARDIAC CATHETERIZATION  > 5 yr ago   Done at [facility], reportedly clean   CARDIAC CATHETERIZATION  11/03/2012   CARDIOVERSION N/A 02/12/2015   Procedure: CARDIOVERSION;  Surgeon: Aleene JINNY Passe, MD;  Location: [facility] ENDOSCOPY;  Service: Cardiovascular;  Laterality: N/A;   CARDIOVERSION N/A 04/27/2017   Procedure: CARDIOVERSION;  Surgeon: Waddell Danelle ORN, MD;  Location: [facility] INVASIVE CV LAB;  Service: Cardiovascular;  Laterality: N/A;   CATARACT EXTRACTION W/PHACO Left 02/12/2024   Procedure: PHACOEMULSIFICATION, CATARACT, WITH IOL INSERTION 25.50 01:56.1;  Surgeon: Carmine Elsie, MD;  Location: [facility];  Service: Ophthalmology;  Laterality: Left;   CLIPPING OF ATRIAL APPENDAGE N/A 01/07/2013   Procedure: CLIPPING OF ATRIAL APPENDAGE;  Surgeon: Dallas KATHEE Jude, MD;  Location: [facility] OR;  Service: Open Heart Surgery;  Laterality: N/A;  COLONOSCOPY W/ POLYPECTOMY     COLONOSCOPY WITH PROPOFOL  N/A 01/05/2021   Procedure: COLONOSCOPY WITH PROPOFOL ;  Surgeon: Toledo, Ladell POUR, MD;  Location: [facility] ENDOSCOPY;  Service: Gastroenterology;  Laterality: N/A;   CORONARY ARTERY BYPASS GRAFT N/A 01/07/2013   Procedure: CORONARY ARTERY BYPASS GRAFTING (CABG);  Surgeon: Dallas KATHEE Jude, MD;  Location: [facility] OR;  Service: Open Heart Surgery;  Laterality: N/A;  Times1 using endoscopically harvested saphenous vein graft to the PDA   ESOPHAGOGASTRODUODENOSCOPY N/A 01/05/2021   Procedure: ESOPHAGOGASTRODUODENOSCOPY (EGD);  Surgeon: Toledo, Ladell POUR, MD;  Location: [facility] ENDOSCOPY;  Service: Gastroenterology;  Laterality: N/A;  DM   EYE SURGERY  Left 02/03/1989   clipped muscle so it wouldn't be pulling up (01/16/2013)   INTRAOPERATIVE TRANSESOPHAGEAL ECHOCARDIOGRAM N/A 01/07/2013   Procedure: INTRAOPERATIVE TRANSESOPHAGEAL ECHOCARDIOGRAM;  Surgeon: Dallas KATHEE Jude, MD;  Location: [facility] OR;  Service: Open Heart Surgery;  Laterality: N/A;   LEFT AND RIGHT HEART CATHETERIZATION WITH CORONARY ANGIOGRAM N/A 11/20/2012   Procedure: LEFT AND RIGHT HEART CATHETERIZATION WITH CORONARY ANGIOGRAM;  Surgeon: Toribio JONELLE Fuel, MD;  Location: [facility] CATH LAB;  Service: Cardiovascular;  Laterality: N/A;   MAZE N/A 01/07/2013   Procedure: MAZE;  Surgeon: Dallas KATHEE Jude, MD;  Location: [facility] OR;  Service: Open Heart Surgery;  Laterality: N/A;   MULTIPLE EXTRACTIONS WITH ALVEOLOPLASTY N/A 12/11/2012   Procedure: Extraction of tooth #'s 2,3,4,5,14,15,17,21,22,23,24,25,26,27,32 wioth alveoloplasty and bialteral fibrous tuberosity reductions.;  Surgeon: Tanda JULIANNA Fanny, DDS;  Location: [facility] ORS;  Service: Oral Surgery;  Laterality: N/A;   NASAL SEPTUM SURGERY  06/05/2002   RIGHT/LEFT HEART CATH AND CORONARY ANGIOGRAPHY N/A 08/02/2021   Procedure: RIGHT/LEFT HEART CATH AND CORONARY ANGIOGRAPHY;  Surgeon: Mady Bruckner, MD;  Location: [facility] INVASIVE CV LAB;  Service: Cardiovascular;  Laterality: N/A;   sepfal deveation repair     TEE WITHOUT CARDIOVERSION N/A 11/21/2012   Procedure: TRANSESOPHAGEAL ECHOCARDIOGRAM (TEE);  Surgeon: Toribio JONELLE Fuel, MD;  Location: [facility] ENDOSCOPY;  Service: Cardiovascular;  Laterality: N/A;   VSD REPAIR N/A 01/07/2013   Procedure: VENTRICULAR SEPTAL DEFECT (VSD) REPAIR;  Surgeon: Dallas KATHEE Jude, MD;  Location: [facility] OR;  Service: Open Heart Surgery;  Laterality: N/A;   VSD REPAIR      Prior to Admission medications   Medication Sig Start Date End Date Taking? Authorizing Provider  amiodarone  (PACERONE ) 200 MG tablet Take 1 tablet (200 mg total) by mouth daily. 11/30/21  Yes Furth, Cadence H, PA-C  amLODipine  (NORVASC ) 2.5 MG  tablet Take by mouth daily. 08/01/23 07/31/24 Yes [provider]  apixaban  (ELIQUIS ) 5 MG TABS tablet Take 1 tablet (5 mg total) by mouth 2 (two) times daily. 08/21/23  Yes Dunn, Bernardino HERO, PA-C  atorvastatin  (LIPITOR ) 20 MG tablet Take 1 tablet (20 mg total) by mouth at bedtime. 01/06/19  Yes Vivienne Bruckner Tanda, NP  furosemide  (LASIX ) 40 MG tablet Take 0.5 tablets (20 mg total) by mouth daily. 09/08/22  Yes Dunn, Bernardino HERO, PA-C  glipiZIDE (GLUCOTROL) 5 MG tablet Take by mouth daily before breakfast.   Yes [provider]  isosorbide  mononitrate (IMDUR ) 30 MG 24 hr tablet Take 0.5 tablets (15 mg total) by mouth daily. 08/02/21  Yes End, Bruckner, MD  losartan  (COZAAR ) 100 MG tablet Take 1 tablet (100 mg total) by mouth daily. 12/06/23 03/05/24 Yes Dunn, Bernardino HERO, PA-C  metFORMIN  (GLUCOPHAGE ) 1000 MG tablet Take 1,000 mg by mouth 2 (two) times daily. 01/16/15  Yes [provider]  metoprolol  succinate (TOPROL -XL) 100 MG 24  hr tablet Take 1 tablet (100 mg total) by mouth daily. Take with or immediately following a meal. 04/06/23  Yes Dunn, Bernardino HERO, PA-C  omeprazole (PRILOSEC) 40 MG capsule Take 40 mg by mouth daily before breakfast.    Yes [provider]  sertraline (ZOLOFT) 50 MG tablet Take 50 mg by mouth daily. 03/04/19  Yes [provider]  Glucose Blood (BLOOD GLUCOSE TEST STRIPS) STRP 1 strip by In Vitro route 2 (two) times daily. 11/25/12   Pietro Redell RAMAN, MD    Allergies as of 02/01/2024 - Review Complete 08/21/2023  Allergen Reaction Noted   Biaxin [clarithromycin] Nausea Only 11/17/2012    Family History  Problem Relation Age of Onset   Cancer Mother    Diabetes Mother     Social History   Socioeconomic History   Marital status: Married    Spouse name: Not on file   Number of children: Not on file   Years of education: Not on file   Highest education level: Not on file  Occupational History   Occupation: Manufacturing    Comment: Heavy  work at times  Tobacco Use   Smoking status: Never    Passive exposure: Never   Smokeless tobacco: Never  Vaping Use   Vaping status: Never Used  Substance and Sexual Activity   Alcohol  use: No   Drug use: No   Sexual activity: Not Currently  Other Topics Concern   Not on file  Social History Narrative   Still works full time. Works around the yard. Lives with wife. Has 2 sisters, neither with cardiac issues.   Social Drivers of Corporate investment banker Strain: Not on file  Food Insecurity: Not on file  Transportation Needs: Not on file  Physical Activity: Not on file  Stress: Not on file  Social Connections: Not on file  Intimate Partner Violence: Not on file    Review of Systems: See HPI, otherwise negative ROS  Physical Exam: BP 133/68   Pulse 73   Temp 97.8 F (36.6 C)   Wt 85.7 kg   SpO2 96%   BMI 25.63 kg/m  General:   Alert, cooperative. Head:  Normocephalic and atraumatic. Respiratory:  Normal work of breathing. Cardiovascular:  NAD  Impression/Plan: Aaron Jones is here for cataract surgery.  Risks, benefits, limitations, and alternatives regarding cataract surgery have been reviewed with the patient.  Questions have been answered.  All parties agreeable.   Elsie Carmine, MD  02/26/2024, 7:13 AM

## 2024-02-28 NOTE — Progress Notes (Unsigned)
 Cardiology Office Note:    Date:  03/01/2024   ID:  Aaron Jones, DOB [birth year], MRN [id number]  PCP:  Rudolpho Norleen BIRCH, MD   [facility] Providers Cardiologist:  Lonni Hanson, MD Electrophysiologist:  Danelle Birmingham, MD { Click to update primary MD,subspecialty MD or APP then REFRESH:1}    Referring MD: Rudolpho Norleen BIRCH, MD   Chief complaint: 38-month follow-up  History of Present Illness:    Aaron Jones is a 78 y.o. male with history of CAD status post single-vessel CABG with SVG to PDA, VSD status post open repair with Dacron patch in 2014, persistent A-fib status post Maze and left atrial appendage clipping during CABG, LV septal hypertrophy, moderate aortic stenosis, moderate to severe mitral regurgitation, PAT, HTN, HLD, DM2, and OSA not on CPAP who presents for 15-month follow-up of CAD, A-fib, and valvular heart disease.   CTA of the chest/aorta in 2021 showed unchanged enlargement of the sinuses of Valsalva measuring up to 4.3 cm, normal caliber ascending thoracic aorta measuring up to 3.5 x 3.5 cm, unchanged enlargement of the proximal descending thoracic aorta measuring up to 3.1 x 3.0 cm.   Zio patch from 04/2021 showed a predominant rhythm of sinus with no evidence of A-fib with an average rate of 77 bpm, 47 episodes of SVT with the longest interval lasting 19 beats, occasional PVCs representing 3.7%, and a PAC burden of 4.1%   Historical echoes from 2022 to 2023 demonstrate improving LVEF, moderate concentric LVH,dacryon patch VSD repair with moderately sized membranous VSD with left-to-right shunting and new moderate aortic stenosis.  R/LHC from 07/2021 showed moderate to severe, but noncritical disease involving the ostial D1, ramus intermedius, and RPDA with widely patent SVG to RPDA. Mildly elevated left heart filling pressures with prominent V waves noted on PCWP tracing, severely elevated right heart filling pressures, moderate pulmonary hypertension, low  normal to mildly reduced cardiac output/index, and no significant intracardiac shunting.   Was referred to the congenital heart clinic at [facility] to establish care for ongoing management of VSD status postrepair with concern for shunting.  Was evaluated by them 10/2022 with recommendation to obtain echo at their office in 6 months.  Echo 05/11/2023 showed EF of 55%, really increased septal wall thickness, moderately-severely dilated RV with normal systolic function, low-flow low gradient moderate aortic stenosis, mild aortic insufficiency, mild to moderate mitral regurgitation, restrictive membranous VSD with left-to-right shunting status post dacryon patch repair in 2014 with a peak velocity of 6.1 cm second and peak gradient 140 mmHg and a Qp/Qs of 2.2, and likely no significant pulmonary hypertension.  No indication for intervention of VSD with recommendation to follow-up echo in 12 months for mitral regurgitation and LV septal hypertrophy.   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   Patient presents to the clinic with his sister, appears to be doing well from a cardiovascular standpoint. He denies chest pain, palpitations, dyspnea, pnd, orthopnea, n, v, dizziness, syncope, edema, weight gain, or early satiety.  Patient is retired, previously worked for [employer].  Is due to follow-up with [facility] in December.   Past Medical History:  Diagnosis Date   Atrial fibrillation with rapid ventricular response (HCC) 01/16/2013   a. 01/2013 s/p R & L Maze and LAA clipping in setting of VSD repair;  b. Prev on amiodarone ;  c. CHA2DS2VASc = 4-->Apixaban ;  c. 01/2013 Recurrent afib/flutter requiring DCCV.   CAD (coronary artery disease)    CHF (congestive heart failure) (HCC)    Cognitive  and behavioral changes    Colon polyp    COPD (chronic obstructive pulmonary disease) (HCC)    Coronary artery disease    a. 01/2013 s/p CABG x 1 (VG->PDA) in setting of VSD repair.   Edentulous    GERD (gastroesophageal reflux disease)    Hearing loss    Hx of CABG    Hyperlipidemia    Hypertension    PCP- [facility] [place], phone; [phone]   Hypertrophic cardiomyopathy (HCC)    Left to right cardiovascular shunt    Loss of memory    Mitral valve disorder    Moderate aortic stenosis by prior echocardiogram    Moderate pulmonary hypertension (HCC)    Moderate to severe mitral regurgitation    Obesity    Paroxysmal atrial fibrillation (HCC)    PAT (paroxysmal atrial tachycardia)    a. 02/2015 s/p DCCV;  b. 12/2016 recurrent PAT - unchanged with escalating BB dose (rates 106-107).   PSVT (paroxysmal supraventricular tachycardia)    a. 10/2011 Admitted to [facility] w/ SVT-->broke with IV dilt.   Recurrent Nephrolithiasis    S/P Maze operation for atrial fibrillation    S/P VSD closure    Schatzki's ring    Sleep apnea    [facility]- in process of being evaluated now, states 12 yrs. ago was on CPAP but after having his deviated septum repaired, he had put the CPAP in storage. Pt. will get a new machine soon.    Thoracic aortic aneurysm    a. 01/2016 CTA chest: 4.0 cm Asc Ao; b. 01/2017 Echo: nl Ao root size.   Type II diabetes mellitus (HCC)    Umbilical hernia    not repaired (01/16/2013)   Unsteady gait    VSD (ventricular septal defect) w/ persistent systolic murmur    a. 01/2013 s/p VSD closure (dacron patch);  b. 01/2017 Echo: EF 50-55%, no rwma, mild AS, mild MR, mod dil LA, dilated RV, sev dil RA, mild TR. PASP nl.    Past Surgical History:  Procedure Laterality Date   CARDIAC CATHETERIZATION  > 5 yr ago   Done at [facility], reportedly clean   CARDIAC CATHETERIZATION  11/03/2012   CARDIOVERSION N/A 02/12/2015   Procedure: CARDIOVERSION;  Surgeon: Aleene JINNY Passe, MD;  Location: [facility]  ENDOSCOPY;  Service: Cardiovascular;  Laterality: N/A;   CARDIOVERSION N/A 04/27/2017   Procedure: CARDIOVERSION;  Surgeon: Waddell Danelle ORN, MD;  Location: [facility] INVASIVE CV LAB;  Service: Cardiovascular;  Laterality: N/A;   CATARACT EXTRACTION W/PHACO Left 02/12/2024   Procedure: PHACOEMULSIFICATION, CATARACT, WITH IOL INSERTION 25.50 01:56.1;  Surgeon: Jaye Fallow, MD;  Location: [facility] SURGERY CNTR;  Service: Ophthalmology;  Laterality: Left;   CATARACT EXTRACTION W/PHACO Right 02/26/2024   Procedure: PHACOEMULSIFICATION, CATARACT, WITH IOL INSERTION 8.41 00:56.6;  Surgeon: Jaye Fallow, MD;  Location: [facility] SURGERY CNTR;  Service: Ophthalmology;  Laterality: Right;   CLIPPING OF ATRIAL APPENDAGE N/A 01/07/2013   Procedure: CLIPPING OF ATRIAL APPENDAGE;  Surgeon: Dallas KATHEE Jude, MD;  Location: [facility] OR;  Service: Open Heart Surgery;  Laterality: N/A;   COLONOSCOPY W/ POLYPECTOMY     COLONOSCOPY WITH PROPOFOL  N/A 01/05/2021   Procedure: COLONOSCOPY WITH PROPOFOL ;  Surgeon: Toledo, Ladell POUR, MD;  Location: [facility] ENDOSCOPY;  Service: Gastroenterology;  Laterality: N/A;   CORONARY ARTERY  BYPASS GRAFT N/A 01/07/2013   Procedure: CORONARY ARTERY BYPASS GRAFTING (CABG);  Surgeon: Dallas KATHEE Jude, MD;  Location: [facility] OR;  Service: Open Heart Surgery;  Laterality: N/A;  Times1 using endoscopically harvested saphenous vein graft to the PDA   ESOPHAGOGASTRODUODENOSCOPY N/A 01/05/2021   Procedure: ESOPHAGOGASTRODUODENOSCOPY (EGD);  Surgeon: Toledo, Ladell POUR, MD;  Location: [facility] ENDOSCOPY;  Service: Gastroenterology;  Laterality: N/A;  DM   EYE SURGERY Left 02/03/1989   clipped muscle so it wouldn't be pulling up (01/16/2013)   INTRAOPERATIVE TRANSESOPHAGEAL ECHOCARDIOGRAM N/A 01/07/2013   Procedure: INTRAOPERATIVE TRANSESOPHAGEAL ECHOCARDIOGRAM;  Surgeon: Dallas KATHEE Jude, MD;  Location: [facility] OR;  Service: Open Heart Surgery;  Laterality: N/A;   LEFT AND RIGHT HEART CATHETERIZATION WITH CORONARY  ANGIOGRAM N/A 11/20/2012   Procedure: LEFT AND RIGHT HEART CATHETERIZATION WITH CORONARY ANGIOGRAM;  Surgeon: Toribio JONELLE Fuel, MD;  Location: [facility] CATH LAB;  Service: Cardiovascular;  Laterality: N/A;   MAZE N/A 01/07/2013   Procedure: MAZE;  Surgeon: Dallas KATHEE Jude, MD;  Location: [facility] OR;  Service: Open Heart Surgery;  Laterality: N/A;   MULTIPLE EXTRACTIONS WITH ALVEOLOPLASTY N/A 12/11/2012   Procedure: Extraction of tooth #'s 2,3,4,5,14,15,17,21,22,23,24,25,26,27,32 wioth alveoloplasty and bialteral fibrous tuberosity reductions.;  Surgeon: Tanda JULIANNA Fanny, DDS;  Location: [facility] ORS;  Service: Oral Surgery;  Laterality: N/A;   NASAL SEPTUM SURGERY  06/05/2002   RIGHT/LEFT HEART CATH AND CORONARY ANGIOGRAPHY N/A 08/02/2021   Procedure: RIGHT/LEFT HEART CATH AND CORONARY ANGIOGRAPHY;  Surgeon: Mady Bruckner, MD;  Location: [facility] INVASIVE CV LAB;  Service: Cardiovascular;  Laterality: N/A;   sepfal deveation repair     TEE WITHOUT CARDIOVERSION N/A 11/21/2012   Procedure: TRANSESOPHAGEAL ECHOCARDIOGRAM (TEE);  Surgeon: Toribio JONELLE Fuel, MD;  Location: [facility] ENDOSCOPY;  Service: Cardiovascular;  Laterality: N/A;   VSD REPAIR N/A 01/07/2013   Procedure: VENTRICULAR SEPTAL DEFECT (VSD) REPAIR;  Surgeon: Dallas KATHEE Jude, MD;  Location: [facility] OR;  Service: Open Heart Surgery;  Laterality: N/A;   VSD REPAIR      Current Medications: Current Meds  Medication Sig   amiodarone  (PACERONE ) 200 MG tablet Take 1 tablet (200 mg total) by mouth daily.   amLODipine  (NORVASC ) 2.5 MG tablet Take by mouth daily.   apixaban  (ELIQUIS ) 5 MG TABS tablet Take 1 tablet (5 mg total) by mouth 2 (two) times daily.   atorvastatin  (LIPITOR ) 20 MG tablet Take 1 tablet (20 mg total) by mouth at bedtime.   furosemide  (LASIX ) 40 MG tablet Take 0.5 tablets (20 mg total) by mouth daily.   glipiZIDE (GLUCOTROL) 5 MG tablet Take by mouth daily before breakfast.   Glucose Blood (BLOOD GLUCOSE TEST STRIPS) STRP 1 strip by In  Vitro route 2 (two) times daily.   HYDROcodone -acetaminophen  (NORCO/VICODIN) 5-325 MG tablet Take 1 tablet by mouth as needed.   isosorbide  mononitrate (IMDUR ) 30 MG 24 hr tablet Take 0.5 tablets (15 mg total) by mouth daily.   losartan  (COZAAR ) 100 MG tablet Take 1 tablet (100 mg total) by mouth daily.   memantine (NAMENDA) 5 MG tablet Take 5 mg by mouth 2 (two) times daily.   metFORMIN  (GLUCOPHAGE ) 1000 MG tablet Take 1,000 mg by mouth 2 (two) times daily.   metoprolol  succinate (TOPROL -XL) 100 MG 24 hr tablet Take 1 tablet (100 mg total) by mouth daily. Take with or immediately following a meal.   omeprazole (PRILOSEC) 40 MG capsule Take 40 mg by mouth daily before breakfast.    sertraline (ZOLOFT) 50 MG tablet Take 50 mg by mouth  daily.     Allergies:   Biaxin [clarithromycin]   Social History   Socioeconomic History   Marital status: Married    Spouse name: Not on file   Number of children: Not on file   Years of education: Not on file   Highest education level: Not on file  Occupational History   Occupation: Manufacturing    Comment: Heavy work at times  Tobacco Use   Smoking status: Never    Passive exposure: Never   Smokeless tobacco: Never  Vaping Use   Vaping status: Never Used  Substance and Sexual Activity   Alcohol  use: No   Drug use: No   Sexual activity: Not Currently  Other Topics Concern   Not on file  Social History Narrative   Still works full time. Works around the yard. Lives with wife. Has 2 sisters, neither with cardiac issues.   Social Drivers of Corporate investment banker Strain: Not on file  Food Insecurity: Not on file  Transportation Needs: Not on file  Physical Activity: Not on file  Stress: Not on file  Social Connections: Not on file     Family History: The patient's family history includes Cancer in his mother; Diabetes in his mother.  ROS:   Please see the history of present illness.     All other systems reviewed and are  negative.  EKGs/Labs/Other Studies Reviewed:    The following studies were reviewed today:  EKG Interpretation Date/Time:  Friday February 29 2024 13:54:44 EDT Ventricular Rate:  71 PR Interval:  88 QRS Duration:  190 QT Interval:  500 QTC Calculation: 543 R Axis:   -78  Text Interpretation: Sinus rhythm with short PR Right bundle branch block Left anterior fascicular block Bifascicular block When compared with ECG of 21-Mar-2023 14:11, PR interval has decreased Confirmed by Annison Birchard [phone]) on 02/29/2024 2:45:07 PM    Recent Labs: No results found for requested labs within last 365 days.  Recent Lipid Panel    Component Value Date/Time   CHOL 142 04/17/2013 0833   CHOL 192 10/29/2011 0429   TRIG 125 04/17/2013 0833   TRIG 150 10/29/2011 0429   HDL 41 04/17/2013 0833   HDL 32 (L) 10/29/2011 0429   CHOLHDL 3.5 04/17/2013 0833   VLDL 30 10/29/2011 0429   LDLCALC 76 04/17/2013 0833   LDLCALC 130 (H) 10/29/2011 0429     Risk Assessment/Calculations:    CHA2DS2-VASc Score = 6  {Confirm score is correct.  If not, click here to update score.  REFRESH note.  :1} This indicates a 9.7% annual risk of stroke. The patient's score is based upon: CHF History: 1 HTN History: 1 Diabetes History: 1 Stroke History: 0 Vascular Disease History: 1 Age Score: 2 Gender Score: 0   {This patient has a significant risk of stroke if diagnosed with atrial fibrillation.  Please consider VKA or DOAC agent for anticoagulation if the bleeding risk is acceptable.   You can also use the SmartPhrase .[email or web address] for documentation.   :[id number]}            Physical Exam:    VS:  BP 114/62 (BP Location: Left Arm, Patient Position: Sitting, Cuff Size: Normal)   Pulse 71   Wt 190 lb (86.2 kg)   SpO2 98%   BMI 25.77 kg/m     Wt Readings from Last 3 Encounters:  02/29/24 190 lb (86.2 kg)  02/26/24 189 lb (85.7 kg)  02/12/24 196 lb 11.2 oz (89.2  kg)     GEN:  Well nourished, well  developed in no acute distress HEENT: Normal NECK: No carotid bruits CARDIAC: RRR, systolic murmur 3/6, no rubs, gallops RESPIRATORY:  Clear to auscultation without rales, wheezing or rhonchi  MUSCULOSKELETAL:  No edema; No deformity  SKIN: Warm and dry NEUROLOGIC:  Alert and oriented x 3 PSYCHIATRIC:  Normal affect   ASSESSMENT:    1. Coronary artery disease involving native coronary artery of native heart without angina pectoris   2. S/P CABG (coronary artery bypass graft)   3. Heart failure with improved ejection fraction (HFimpEF) (HCC)   4. Medication management   5. Aortic dilatation   6. Dyslipidemia   7. Mitral valve insufficiency, unspecified etiology   8. Aortic valve stenosis, etiology of cardiac valve disease unspecified    PLAN:    In order of problems listed above:  CAD status post CABG X1, without angina EKG: Sinus rhythm with shortened PR interval, RBBB, LAFB, 71 bpm Denies chest pain, shortness of breath, palpitations, lightheadedness, leg swelling On Eliquis  instead of aspirin  secondary to A-fib Continue apixaban  5 mg twice daily, as this is the correct dose for his age, weight, creatinine Continue atorvastatin  20 mg daily Continue Imdur  15 mg daily Continue Toprol -XL 100 mg daily  HFimpEF/LVH Denies shortness of breath, orthopnea, palpitations, leg swelling Does not check weight at home, discussed importance of monitoring weights regularly Appears euvolemic on exam GDMT deferred at prior visit considering asymptomatic presentation in the setting of normalization of LV function  Continue Lasix  20 mg daily Continue losartan  100 mg daily Continue Toprol -XL 100 mg daily Ordering echo to monitor valvular status, will also re-evaluate EF at this time.  VSD status post dacryon repair QP/QS 1.3 Followed by the adult congenital cardiology clinic at [facility] No current indication for intervention at this time Follows up with [facility] in December  Persistent atrial  fibrillation In sinus rhythm today Ordered a CMP and TSH to evaluate liver and thyroid  function Continue Toprol -XL 100 mg daily Continue amiodarone  200 mg daily Continue Eliquis  5 mg twice daily  Aortic stenosis Mitral regurgitation Moderate aortic stenosis and moderate/severe eccentric mitral regurgitation seen on previous echos Will order follow-up echo to reevaluate today   Thoracic aortic ectasia Stable ectasia of aortic root measuring 4.2 cm with no evidence of aneurysmal dilatation of the thoracic aorta on CTA chest/aorta in 06/2022 Will order repeat CT scan for further monitoring  HTN Continue Imdur  15 mg daily Continue losartan  100 mg daily Continue Toprol -XL 100 mg daily  HLD Ordering fasting lipids and CMP Continue atorvastatin  20 mg daily  Follow-up with [facility] in 6 months or sooner if new symptoms occur  Medication Adjustments/Labs and Tests Ordered: Current medicines are reviewed at length with the patient today.  Concerns regarding medicines are outlined above.  Orders Placed This Encounter  Procedures   CT ANGIO CHEST AORTA W/CM & OR WO/CM   Lipid panel   TSH   Comprehensive metabolic panel with GFR   EKG 87-Ozji   ECHOCARDIOGRAM COMPLETE   No orders of the defined types were placed in this encounter.   Patient Instructions  Medication Instructions:  Your physician recommends that you continue on your current medications as directed. Please refer to the Current Medication list given to you today.   *If you need a refill on your cardiac medications before your next appointment, please call your pharmacy*  Lab Work: Your provider would like for you to return in the next week to  have the following labs drawn: Fasting lipid panel, CMP, and TSH.   Please go to [facility] [address] ([facility]) [address], [place] [address] You do not need an appointment.  They are open from 8 am- 4:30 pm.  Lunch from 1:00 pm- 2:00 pm You DO need to  be fasting.   You may also go to one of the following [facility]:  [address] [place], [place] [address] Phone: [phone] Lab hours: Mon-Fri 8 am- 5 pm    Lunch 12 pm- 1 pm  [address] [place],  [place]  [address]  [place]  Phone: [phone] Lab hours: 7 am- 4 pm Lunch 12 pm-1 pm   [address] [place],  [place]  [address]  [place]  Phone: [phone] Lab hours: Mon-Fri 8 am- 5 pm    Lunch 12 pm- 1 pm  If you have labs (blood work) drawn today and your tests are completely normal, you will receive your results only by: MyChart Message (if you have MyChart) OR A paper copy in the mail If you have any lab test that is abnormal or we need to change your treatment, we will call you to review the results.  Testing/Procedures: Your physician has requested that you have an echocardiogram. Echocardiography is a painless test that uses sound waves to create images of your heart. It provides your doctor with information about the size and shape of your heart and how well your heart's chambers and valves are working.   You may receive an ultrasound enhancing agent through an IV if needed to better visualize your heart during the echo. This procedure takes approximately one hour.  There are no restrictions for this procedure.  This will take place at [address] ([facility]) [address], [place] [address]  CT Angiography (CTA) chest/thoracic aorta, is a special type of CT scan that uses a computer to produce multi-dimensional views of major blood vessels throughout the body. In CT angiography, a contrast material is injected through an IV to help visualize the blood vessels  Nothing to eat or drink 4 hours prior to test  [facility] [address]  [place], [place] [address]  Follow-Up: At [facility], you and your health needs are our priority.  As part of our continuing mission to provide you with exceptional heart care, our  providers are all part of one team.  This team includes your primary Cardiologist (physician) and Advanced Practice Providers or APPs (Physician Assistants and Nurse Practitioners) who all work together to provide you with the care you need, when you need it.  Your next appointment:   6 month(s)  Provider:   You may see Lonni Hanson, MD or Bernardino Bring, PA-C   Signed, Miriam FORBES Shams, NP  03/01/2024 9:20 AM    [facility]

## 2024-02-29 ENCOUNTER — Ambulatory Visit: Attending: Physician Assistant | Admitting: Emergency Medicine

## 2024-02-29 ENCOUNTER — Encounter: Payer: Self-pay | Admitting: Physician Assistant

## 2024-02-29 VITALS — BP 114/62 | HR 71 | Wt 190.0 lb

## 2024-02-29 DIAGNOSIS — I77819 Aortic ectasia, unspecified site: Secondary | ICD-10-CM | POA: Diagnosis present

## 2024-02-29 DIAGNOSIS — Z951 Presence of aortocoronary bypass graft: Secondary | ICD-10-CM | POA: Diagnosis present

## 2024-02-29 DIAGNOSIS — I350 Nonrheumatic aortic (valve) stenosis: Secondary | ICD-10-CM | POA: Insufficient documentation

## 2024-02-29 DIAGNOSIS — I10 Essential (primary) hypertension: Secondary | ICD-10-CM | POA: Insufficient documentation

## 2024-02-29 DIAGNOSIS — E785 Hyperlipidemia, unspecified: Secondary | ICD-10-CM | POA: Insufficient documentation

## 2024-02-29 DIAGNOSIS — I340 Nonrheumatic mitral (valve) insufficiency: Secondary | ICD-10-CM | POA: Diagnosis present

## 2024-02-29 DIAGNOSIS — I2510 Atherosclerotic heart disease of native coronary artery without angina pectoris: Secondary | ICD-10-CM | POA: Diagnosis not present

## 2024-02-29 DIAGNOSIS — I5032 Chronic diastolic (congestive) heart failure: Secondary | ICD-10-CM | POA: Insufficient documentation

## 2024-02-29 DIAGNOSIS — Z79899 Other long term (current) drug therapy: Secondary | ICD-10-CM | POA: Diagnosis present

## 2024-02-29 NOTE — Patient Instructions (Signed)
 Medication Instructions:  Your physician recommends that you continue on your current medications as directed. Please refer to the Current Medication list given to you today.   *If you need a refill on your cardiac medications before your next appointment, please call your pharmacy*  Lab Work: Your provider would like for you to return in the next week to have the following labs drawn: Fasting lipid panel, CMP, and TSH.   Please go to [facility] [address] ([facility]) [address], [place] [address] You do not need an appointment.  They are open from 8 am- 4:30 pm.  Lunch from 1:00 pm- 2:00 pm You DO need to be fasting.   You may also go to one of the following [facility]:  [address] [place], [place] [address] Phone: [phone] Lab hours: Mon-Fri 8 am- 5 pm    Lunch 12 pm- 1 pm  [address] [place],  [place]  [address]  [place]  Phone: [phone] Lab hours: 7 am- 4 pm Lunch 12 pm-1 pm   [address] [place],  [place]  [address]  [place]  Phone: [phone] Lab hours: Mon-Fri 8 am- 5 pm    Lunch 12 pm- 1 pm  If you have labs (blood work) drawn today and your tests are completely normal, you will receive your results only by: MyChart Message (if you have MyChart) OR A paper copy in the mail If you have any lab test that is abnormal or we need to change your treatment, we will call you to review the results.  Testing/Procedures: Your physician has requested that you have an echocardiogram. Echocardiography is a painless test that uses sound waves to create images of your heart. It provides your doctor with information about the size and shape of your heart and how well your heart's chambers and valves are working.   You may receive an ultrasound enhancing agent through an IV if needed to better visualize your heart during the echo. This procedure takes approximately one hour.  There are no restrictions for this procedure.  This will take place at [address] ([facility]) [address], [place] [address]  CT Angiography (CTA) chest/thoracic aorta, is a special type of CT scan that uses a computer to produce multi-dimensional views of major blood vessels throughout the body. In CT angiography, a contrast material is injected through an IV to help visualize the blood vessels  Nothing to eat or drink 4 hours prior to test  [facility] [address]  [place], [place] [address]  Follow-Up: At [facility], you and your health needs are our priority.  As part of our continuing mission to provide you with exceptional heart care, our providers are all part of one team.  This team includes your primary Cardiologist (physician) and Advanced Practice Providers or APPs (Physician Assistants and Nurse Practitioners) who all work together to provide you with the care you need, when you need it.  Your next appointment:   6 month(s)  Provider:   You may see Lonni Hanson, MD or Bernardino Bring, PA-C

## 2024-03-07 ENCOUNTER — Telehealth: Payer: Self-pay | Admitting: Internal Medicine

## 2024-03-07 NOTE — Telephone Encounter (Signed)
   Pre-operative Risk Assessment    Patient Name: Aaron Jones  DOB: May 11, 1946 MRN: [id number]   Date of last office visit: 08/21/2023 Date of next office visit: 08/28/2024   Request for Surgical Clearance    Procedure:  Lumbar Epidural Steroid Injection  Date of Surgery:  Clearance TBD                                Surgeon:  not indicated Surgeon's Group or Practice Name:  [facility], [place] Phone number:  [phone] Fax number:  [phone]   Type of Clearance Requested:   - Pharmacy:  Hold Apixaban  (Eliquis ) 3  days prior   Type of Anesthesia:  None    Additional requests/questions:    SignedTinnie NOVAK Schools   03/07/2024, 5:07 PM

## 2024-03-11 NOTE — Telephone Encounter (Signed)
 Please advise holding Eliquis  prior to lumbar ESI TBD  Thank you!  DW

## 2024-03-11 NOTE — Telephone Encounter (Signed)
 Patient with diagnosis of afib on Eliquis  for anticoagulation.    Procedure: Lumbar Epidural Steroid Injection  Date of procedure: TBD   CHA2DS2-VASc Score = 6   This indicates a 9.7% annual risk of stroke. The patient's score is based upon: CHF History: 1 HTN History: 1 Diabetes History: 1 Stroke History: 0 Vascular Disease History: 1 Age Score: 2 Gender Score: 0      CrCl 59 ml/min Platelet count 300  Patient has not had an Afib/aflutter ablation in the last 3 months, DCCV within the last 4 weeks or a watchman implanted in the last 45 days   Per office protocol, patient can hold Eliquis  for 3 days prior to procedure.    **This guidance is not considered finalized until pre-operative APP has relayed final recommendations.**

## 2024-03-11 NOTE — Telephone Encounter (Signed)
   Name: Aaron Jones  DOB: 1946-05-07  MRN: [id number]   Primary Cardiologist: Lonni Hanson, MD  Chart reviewed as part of pre-operative protocol coverage.   Per Pharm D, patient has not had an Afib/aflutter ablation within the last 3 months, DCCV within the last 4 weeks, or Watchman in the last 45 days. Patient may hold Eliquis  for 3 days prior to procedure.    I will route this recommendation to the requesting party via Epic fax function and remove from pre-op pool. Please call with questions.  Barnie Hila, NP 03/11/2024, 3:46 PM

## 2024-03-26 ENCOUNTER — Emergency Department

## 2024-03-26 ENCOUNTER — Inpatient Hospital Stay
Admission: EM | Admit: 2024-03-26 | Discharge: 2024-03-29 | DRG: 637 | Disposition: A | Discharge status: Home Health | Attending: Internal Medicine | Admitting: Internal Medicine

## 2024-03-26 DIAGNOSIS — Z6832 Body mass index (BMI) 32.0-32.9, adult: Secondary | ICD-10-CM

## 2024-03-26 DIAGNOSIS — I10 Essential (primary) hypertension: Secondary | ICD-10-CM | POA: Diagnosis present

## 2024-03-26 DIAGNOSIS — I422 Other hypertrophic cardiomyopathy: Secondary | ICD-10-CM | POA: Diagnosis present

## 2024-03-26 DIAGNOSIS — R410 Disorientation, unspecified: Secondary | ICD-10-CM | POA: Diagnosis not present

## 2024-03-26 DIAGNOSIS — Z7984 Long term (current) use of oral hypoglycemic drugs: Secondary | ICD-10-CM

## 2024-03-26 DIAGNOSIS — I080 Rheumatic disorders of both mitral and aortic valves: Secondary | ICD-10-CM | POA: Diagnosis present

## 2024-03-26 DIAGNOSIS — T383X5A Adverse effect of insulin and oral hypoglycemic [antidiabetic] drugs, initial encounter: Secondary | ICD-10-CM | POA: Diagnosis present

## 2024-03-26 DIAGNOSIS — Z79899 Other long term (current) drug therapy: Secondary | ICD-10-CM

## 2024-03-26 DIAGNOSIS — G9341 Metabolic encephalopathy: Secondary | ICD-10-CM | POA: Diagnosis present

## 2024-03-26 DIAGNOSIS — I130 Hypertensive heart and chronic kidney disease with heart failure and stage 1 through stage 4 chronic kidney disease, or unspecified chronic kidney disease: Secondary | ICD-10-CM | POA: Diagnosis present

## 2024-03-26 DIAGNOSIS — J449 Chronic obstructive pulmonary disease, unspecified: Secondary | ICD-10-CM | POA: Diagnosis present

## 2024-03-26 DIAGNOSIS — I5032 Chronic diastolic (congestive) heart failure: Secondary | ICD-10-CM | POA: Diagnosis present

## 2024-03-26 DIAGNOSIS — E11649 Type 2 diabetes mellitus with hypoglycemia without coma: Secondary | ICD-10-CM | POA: Diagnosis not present

## 2024-03-26 DIAGNOSIS — N179 Acute kidney failure, unspecified: Secondary | ICD-10-CM | POA: Diagnosis present

## 2024-03-26 DIAGNOSIS — E860 Dehydration: Secondary | ICD-10-CM | POA: Diagnosis present

## 2024-03-26 DIAGNOSIS — E66811 Obesity, class 1: Secondary | ICD-10-CM | POA: Diagnosis present

## 2024-03-26 DIAGNOSIS — I272 Pulmonary hypertension, unspecified: Secondary | ICD-10-CM | POA: Diagnosis present

## 2024-03-26 DIAGNOSIS — H9190 Unspecified hearing loss, unspecified ear: Secondary | ICD-10-CM | POA: Diagnosis present

## 2024-03-26 DIAGNOSIS — Z888 Allergy status to other drugs, medicaments and biological substances status: Secondary | ICD-10-CM

## 2024-03-26 DIAGNOSIS — Z860100 Personal history of colon polyps, unspecified: Secondary | ICD-10-CM

## 2024-03-26 DIAGNOSIS — Z8774 Personal history of (corrected) congenital malformations of heart and circulatory system: Secondary | ICD-10-CM

## 2024-03-26 DIAGNOSIS — I480 Paroxysmal atrial fibrillation: Secondary | ICD-10-CM | POA: Diagnosis present

## 2024-03-26 DIAGNOSIS — Z8679 Personal history of other diseases of the circulatory system: Secondary | ICD-10-CM

## 2024-03-26 DIAGNOSIS — E162 Hypoglycemia, unspecified: Principal | ICD-10-CM | POA: Diagnosis present

## 2024-03-26 DIAGNOSIS — I2510 Atherosclerotic heart disease of native coronary artery without angina pectoris: Secondary | ICD-10-CM | POA: Diagnosis present

## 2024-03-26 DIAGNOSIS — Z833 Family history of diabetes mellitus: Secondary | ICD-10-CM

## 2024-03-26 DIAGNOSIS — N1831 Chronic kidney disease, stage 3a: Secondary | ICD-10-CM | POA: Diagnosis present

## 2024-03-26 DIAGNOSIS — Z87442 Personal history of urinary calculi: Secondary | ICD-10-CM

## 2024-03-26 DIAGNOSIS — E1129 Type 2 diabetes mellitus with other diabetic kidney complication: Secondary | ICD-10-CM | POA: Diagnosis present

## 2024-03-26 DIAGNOSIS — E1122 Type 2 diabetes mellitus with diabetic chronic kidney disease: Secondary | ICD-10-CM | POA: Diagnosis present

## 2024-03-26 DIAGNOSIS — R55 Syncope and collapse: Secondary | ICD-10-CM | POA: Diagnosis not present

## 2024-03-26 DIAGNOSIS — R413 Other amnesia: Secondary | ICD-10-CM | POA: Diagnosis present

## 2024-03-26 DIAGNOSIS — Z7901 Long term (current) use of anticoagulants: Secondary | ICD-10-CM

## 2024-03-26 DIAGNOSIS — G9340 Encephalopathy, unspecified: Secondary | ICD-10-CM

## 2024-03-26 DIAGNOSIS — I452 Bifascicular block: Secondary | ICD-10-CM | POA: Diagnosis present

## 2024-03-26 DIAGNOSIS — E669 Obesity, unspecified: Secondary | ICD-10-CM | POA: Diagnosis present

## 2024-03-26 DIAGNOSIS — E785 Hyperlipidemia, unspecified: Secondary | ICD-10-CM | POA: Diagnosis present

## 2024-03-26 DIAGNOSIS — Z951 Presence of aortocoronary bypass graft: Secondary | ICD-10-CM

## 2024-03-26 LAB — CBC WITH DIFFERENTIAL/PLATELET
Abs Immature Granulocytes: 0.01 K/uL (ref 0.00–0.07)
Basophils Absolute: 0 K/uL (ref 0.0–0.1)
Basophils Relative: 1 %
Eosinophils Absolute: 0.1 K/uL (ref 0.0–0.5)
Eosinophils Relative: 2 %
HCT: 33.4 % — ABNORMAL LOW (ref 39.0–52.0)
Hemoglobin: 10.6 g/dL — ABNORMAL LOW (ref 13.0–17.0)
Immature Granulocytes: 0 %
Lymphocytes Relative: 27 %
Lymphs Abs: 1.5 K/uL (ref 0.7–4.0)
MCH: 30.5 pg (ref 26.0–34.0)
MCHC: 31.7 g/dL (ref 30.0–36.0)
MCV: 96 fL (ref 80.0–100.0)
Monocytes Absolute: 0.6 K/uL (ref 0.1–1.0)
Monocytes Relative: 10 %
Neutro Abs: 3.4 K/uL (ref 1.7–7.7)
Neutrophils Relative %: 60 %
Platelets: 179 K/uL (ref 150–400)
RBC: 3.48 MIL/uL — ABNORMAL LOW (ref 4.22–5.81)
RDW: 13.6 % (ref 11.5–15.5)
WBC: 5.7 K/uL (ref 4.0–10.5)
nRBC: 0 % (ref 0.0–0.2)

## 2024-03-26 LAB — COMPREHENSIVE METABOLIC PANEL WITH GFR
ALT: 9 U/L (ref 0–44)
AST: 18 U/L (ref 15–41)
Albumin: 3.3 g/dL — ABNORMAL LOW (ref 3.5–5.0)
Alkaline Phosphatase: 54 U/L (ref 38–126)
Anion gap: 9 (ref 5–15)
BUN: 26 mg/dL — ABNORMAL HIGH (ref 8–23)
CO2: 23 mmol/L (ref 22–32)
Calcium: 8.5 mg/dL — ABNORMAL LOW (ref 8.9–10.3)
Chloride: 107 mmol/L (ref 98–111)
Creatinine, Ser: 1.68 mg/dL — ABNORMAL HIGH (ref 0.61–1.24)
GFR, Estimated: 42 mL/min — ABNORMAL LOW (ref >60)
Glucose, Bld: 75 mg/dL (ref 70–99)
Potassium: 4.7 mmol/L (ref 3.5–5.1)
Sodium: 139 mmol/L (ref 135–145)
Total Bilirubin: 0.6 mg/dL (ref 0.0–1.2)
Total Protein: 6.5 g/dL (ref 6.5–8.1)

## 2024-03-26 LAB — CBG MONITORING, ED
Glucose-Capillary: 81 mg/dL (ref 70–99)
Glucose-Capillary: 54 mg/dL — ABNORMAL LOW (ref 70–99)
Glucose-Capillary: 77 mg/dL (ref 70–99)
Glucose-Capillary: 117 mg/dL — ABNORMAL HIGH (ref 70–99)

## 2024-03-26 MED ORDER — HYDRALAZINE HCL 20 MG/ML IJ SOLN
5.0000 mg | INTRAMUSCULAR | Status: DC | PRN
  Administered 2024-03-29: 5 mg via INTRAVENOUS
  Filled 2024-03-26: qty 1

## 2024-03-26 MED ORDER — DM-GUAIFENESIN ER 30-600 MG PO TB12: 1.0000 | ORAL_TABLET | Freq: Two times a day (BID) | ORAL | Status: DC | PRN

## 2024-03-26 MED ORDER — DEXTROSE 50 % IV SOLN: 50.0000 mL | Freq: Once | INTRAVENOUS | Status: AC

## 2024-03-26 MED ORDER — OCTREOTIDE ACETATE 100 MCG/ML IJ SOLN
50.0000 ug | Freq: Once | INTRAMUSCULAR | Status: DC
  Filled 2024-03-26: qty 0.5

## 2024-03-26 MED ORDER — ACETAMINOPHEN 325 MG PO TABS: 650.0000 mg | ORAL_TABLET | Freq: Four times a day (QID) | ORAL | Status: DC | PRN

## 2024-03-26 MED ORDER — DIPHENHYDRAMINE HCL 50 MG/ML IJ SOLN
12.5000 mg | Freq: Three times a day (TID) | INTRAMUSCULAR | Status: DC | PRN
  Administered 2024-03-29: 12.5 mg via INTRAVENOUS
  Filled 2024-03-26: qty 1

## 2024-03-26 MED ORDER — ALBUTEROL SULFATE (2.5 MG/3ML) 0.083% IN NEBU: 2.5000 mg | INHALATION_SOLUTION | RESPIRATORY_TRACT | Status: DC | PRN

## 2024-03-26 MED ORDER — DEXTROSE 10 % IV SOLN: INTRAVENOUS | Status: DC

## 2024-03-26 MED ORDER — DEXTROSE 50 % IV SOLN: 50.0000 mL | INTRAVENOUS | Status: DC | PRN

## 2024-03-26 MED ORDER — DEXTROSE 50 % IV SOLN
INTRAVENOUS | Status: AC
  Administered 2024-03-26: 50 mL via INTRAVENOUS
  Filled 2024-03-26: qty 50

## 2024-03-26 NOTE — ED Notes (Signed)
 Fall risk bundle in place.

## 2024-03-26 NOTE — ED Triage Notes (Addendum)
 Pt arrived to ED via [facility] for AMS. Family states that their glucometer wasn't working so they started giving him a bunch of food in case he was hypoglycemic. EMS states his blood glucose was 135. Blood glucose now is 80. Pt is currently A&Ox3 but disoriented to year. EMS states that normally this pt is A&Ox4. RBBB shown on EKG. 500 LR  given with EMS. Family states pt's LKWT was at 1800 tonight. Negative stroke screen by EMS and negative stroke screen by this RN. NIH of 0.

## 2024-03-26 NOTE — H&P (Incomplete)
 History and Physical    KHUP SAPIA FMW:[id number] DOB: [birth year] DOA: 03/26/2024  Referring MD/NP/PA:   PCP: Rudolpho Norleen BIRCH, MD   Patient coming from:  The patient is coming from home.     Chief Complaint: AMS  HPI: Aaron Jones is a 78 y.o. male with medical history significant of DM, HTN, HLD,  COPD, CAD, CABG, dCHF, memory loss, CKD-3a, pulm HTN, mitral valve regurgitation, thoracic aneurysm, kidney stone, hypertrophic cardiomyopathy, hard of hearing, A-fib on Eliquis  (s/p Maze), VSD (s/p of closure 2014), PSVT, who presents with altered mental status.  Per her sister-in-law at the bedside, patient normally is alert and orientated x 3.  He takes care of his wife at home.  At about 6 PM, patient was found to be confused.  Patient had hypoglycemia causing confusion in the past. Family is not able to check his blood sugar levels since his glucometer was not working.  They think patient may have hypoglycemia, and started giving him bone to food.  Per EMS, his blood glucose was 135. His blood sugar dropped to 54 in ED.   When I saw patient in ED, patient is confused, knows his own name, knows that he is in the hospital, but not oriented to the time which is not normal to patient per his sister-in-law.  Patient does not seem to have chest pain or abdominal pain.  No active nausea, vomiting or diarrhea noted.  No active respiratory distress, cough noted.  Not sure if patient has symptoms of UTI.  He moves all extremities.  Data reviewed independently and ED Course: pt was found to have WBC 5.6, worsening renal function, temperature normal, bilateral pressure 125/62, heart rate 70, RR 18, oxygen saturation 99% on room air.  CT of head negative acute intracranial abnormalities.  Patient is placed in PCU for observation.   EKG: I have personally reviewed.  Sinus rhythm, QTc 549, bifascicular block, poor R wave progression.   Review of Systems: Could not be reviewed accurately due to  altered mental status.  Allergy:  Allergies  Allergen Reactions   Biaxin [Clarithromycin] Nausea Only    Past Medical History:  Diagnosis Date   Atrial fibrillation with rapid ventricular response (HCC) 01/16/2013   a. 01/2013 s/p R & L Maze and LAA clipping in setting of VSD repair;  b. Prev on amiodarone ;  c. CHA2DS2VASc = 4-->Apixaban ;  c. 01/2013 Recurrent afib/flutter requiring DCCV.   CAD (coronary artery disease)    CHF (congestive heart failure) (HCC)    Cognitive and behavioral changes    Colon polyp    COPD (chronic obstructive pulmonary disease) (HCC)    Coronary artery disease    a. 01/2013 s/p CABG x 1 (VG->PDA) in setting of VSD repair.   Edentulous    GERD (gastroesophageal reflux disease)    Hearing loss    Hx of CABG    Hyperlipidemia    Hypertension    PCP- [facility] - [place], phone; [phone]   Hypertrophic cardiomyopathy (HCC)    Left to right cardiovascular shunt    Loss of memory    Mitral valve disorder    Moderate aortic stenosis by prior echocardiogram    Moderate pulmonary hypertension (HCC)    Moderate to severe mitral regurgitation    Obesity    Paroxysmal atrial fibrillation (HCC)    PAT (paroxysmal atrial tachycardia)    a. 02/2015 s/p DCCV;  b. 12/2016 recurrent PAT - unchanged with escalating BB dose (rates 106-107).  PSVT (paroxysmal supraventricular tachycardia)    a. 10/2011 Admitted to [facility] [place] w/ SVT-->broke with IV dilt.   Recurrent Nephrolithiasis    S/P Maze operation for atrial fibrillation    S/P VSD closure    Schatzki's ring    Sleep apnea    [facility]- in process of being evaluated now, states 12 yrs. ago was on CPAP but after having his deviated septum repaired, he had put the CPAP in storage. Pt. will get a new machine soon.    Thoracic aortic aneurysm    a. 01/2016 CTA chest: 4.0 cm Asc Ao; b. 01/2017 Echo: nl Ao root size.   Type II diabetes mellitus (HCC)    Umbilical hernia    not repaired (01/16/2013)   Unsteady gait    VSD  (ventricular septal defect) w/ persistent systolic murmur    a. 01/2013 s/p VSD closure (dacron patch);  b. 01/2017 Echo: EF 50-55%, no rwma, mild AS, mild MR, mod dil LA, dilated RV, sev dil RA, mild TR. PASP nl.    Past Surgical History:  Procedure Laterality Date   CARDIAC CATHETERIZATION  > 5 yr ago   Done at [facility], reportedly clean   CARDIAC CATHETERIZATION  11/03/2012   CARDIOVERSION N/A 02/12/2015   Procedure: CARDIOVERSION;  Surgeon: Aleene JINNY Passe, MD;  Location: [facility] ENDOSCOPY;  Service: Cardiovascular;  Laterality: N/A;   CARDIOVERSION N/A 04/27/2017   Procedure: CARDIOVERSION;  Surgeon: Waddell Danelle ORN, MD;  Location: [facility] INVASIVE CV LAB;  Service: Cardiovascular;  Laterality: N/A;   CATARACT EXTRACTION W/PHACO Left 02/12/2024   Procedure: PHACOEMULSIFICATION, CATARACT, WITH IOL INSERTION 25.50 01:56.1;  Surgeon: Jaye Fallow, MD;  Location: [facility] SURGERY CNTR;  Service: Ophthalmology;  Laterality: Left;   CATARACT EXTRACTION W/PHACO Right 02/26/2024   Procedure: PHACOEMULSIFICATION, CATARACT, WITH IOL INSERTION 8.41 00:56.6;  Surgeon: Jaye Fallow, MD;  Location: [facility] SURGERY CNTR;  Service: Ophthalmology;  Laterality: Right;   CLIPPING OF ATRIAL APPENDAGE N/A 01/07/2013   Procedure: CLIPPING OF ATRIAL APPENDAGE;  Surgeon: Dallas KATHEE Jude, MD;  Location: [facility] OR;  Service: Open Heart Surgery;  Laterality: N/A;   COLONOSCOPY W/ POLYPECTOMY     COLONOSCOPY WITH PROPOFOL  N/A 01/05/2021   Procedure: COLONOSCOPY WITH PROPOFOL ;  Surgeon: Toledo, Ladell POUR, MD;  Location: [facility] ENDOSCOPY;  Service: Gastroenterology;  Laterality: N/A;   CORONARY ARTERY BYPASS GRAFT N/A 01/07/2013   Procedure: CORONARY ARTERY BYPASS GRAFTING (CABG);  Surgeon: Dallas KATHEE Jude, MD;  Location[facility] OR;  Service: Open Heart Surgery;  Laterality: N/A;  Times1 using endoscopically harvested saphenous vein graft to the PDA   ESOPHAGOGASTRODUODENOSCOPY N/A 01/05/2021   Procedure: ESOPHAGOGASTRODUODENOSCOPY  (EGD);  Surgeon: Toledo, Ladell POUR, MD;  Location: [facility] ENDOSCOPY;  Service: Gastroenterology;  Laterality: N/A;  DM   EYE SURGERY Left 02/03/1989   clipped muscle so it wouldn't be pulling up (01/16/2013)   INTRAOPERATIVE TRANSESOPHAGEAL ECHOCARDIOGRAM N/A 01/07/2013   Procedure: INTRAOPERATIVE TRANSESOPHAGEAL ECHOCARDIOGRAM;  Surgeon: Dallas KATHEE Jude, MD;  Location: [facility] OR;  Service: Open Heart Surgery;  Laterality: N/A;   LEFT AND RIGHT HEART CATHETERIZATION WITH CORONARY ANGIOGRAM N/A 11/20/2012   Procedure: LEFT AND RIGHT HEART CATHETERIZATION WITH CORONARY ANGIOGRAM;  Surgeon: Toribio JONELLE Fuel, MD;  Location: [facility] CATH LAB;  Service: Cardiovascular;  Laterality: N/A;   MAZE N/A 01/07/2013   Procedure: MAZE;  Surgeon: Dallas KATHEE Jude, MD;  Location: [facility] OR;  Service: Open Heart Surgery;  Laterality: N/A;   MULTIPLE EXTRACTIONS WITH ALVEOLOPLASTY N/A 12/11/2012   Procedure: Extraction of tooth #'s 2,3,4,5,14,15,17,21,22,23,24,25,26,27,32  wioth alveoloplasty and bialteral fibrous tuberosity reductions.;  Surgeon: Tanda JULIANNA Fanny, DDS;  Location: [facility] ORS;  Service: Oral Surgery;  Laterality: N/A;   NASAL SEPTUM SURGERY  06/05/2002   RIGHT/LEFT HEART CATH AND CORONARY ANGIOGRAPHY N/A 08/02/2021   Procedure: RIGHT/LEFT HEART CATH AND CORONARY ANGIOGRAPHY;  Surgeon: Mady Bruckner, MD;  Location: [facility] INVASIVE CV LAB;  Service: Cardiovascular;  Laterality: N/A;   sepfal deveation repair     TEE WITHOUT CARDIOVERSION N/A 11/21/2012   Procedure: TRANSESOPHAGEAL ECHOCARDIOGRAM (TEE);  Surgeon: Toribio JONELLE Fuel, MD;  Location: [facility] ENDOSCOPY;  Service: Cardiovascular;  Laterality: N/A;   VSD REPAIR N/A 01/07/2013   Procedure: VENTRICULAR SEPTAL DEFECT (VSD) REPAIR;  Surgeon: Dallas KATHEE Jude, MD;  Location: [facility] OR;  Service: Open Heart Surgery;  Laterality: N/A;   VSD REPAIR      Social History:  reports that he has never smoked. He has never been exposed to tobacco smoke. He has never used  smokeless tobacco. He reports that he does not drink alcohol  and does not use drugs.  Family History:  Family History  Problem Relation Age of Onset   Cancer Mother    Diabetes Mother      Prior to Admission medications   Medication Sig Start Date End Date Taking? Authorizing Provider  amiodarone  (PACERONE ) 200 MG tablet Take 1 tablet (200 mg total) by mouth daily. 11/30/21   Furth, Cadence H, PA-C  amLODipine  (NORVASC ) 2.5 MG tablet Take by mouth daily. 08/01/23 07/31/24  [provider]  apixaban  (ELIQUIS ) 5 MG TABS tablet Take 1 tablet (5 mg total) by mouth 2 (two) times daily. 08/21/23   Dunn, Bernardino HERO, PA-C  atorvastatin  (LIPITOR ) 20 MG tablet Take 1 tablet (20 mg total) by mouth at bedtime. 01/06/19   Vivienne Bruckner Tanda, NP  furosemide  (LASIX ) 40 MG tablet Take 0.5 tablets (20 mg total) by mouth daily. 09/08/22   Dunn, Bernardino HERO, PA-C  glipiZIDE (GLUCOTROL) 5 MG tablet Take by mouth daily before breakfast.    [provider]  Glucose Blood (BLOOD GLUCOSE TEST STRIPS) STRP 1 strip by In Vitro route 2 (two) times daily. 11/25/12   Pietro Redell RAMAN, MD  HYDROcodone -acetaminophen  (NORCO/VICODIN) 5-325 MG tablet Take 1 tablet by mouth as needed. 02/27/24   [provider]  isosorbide  mononitrate (IMDUR ) 30 MG 24 hr tablet Take 0.5 tablets (15 mg total) by mouth daily. 08/02/21   End, Bruckner, MD  losartan  (COZAAR ) 100 MG tablet Take 1 tablet (100 mg total) by mouth daily. 12/06/23 03/05/24  Abigail Bernardino HERO, PA-C  memantine (NAMENDA) 5 MG tablet Take 5 mg by mouth 2 (two) times daily. 01/09/24   [provider]  metFORMIN  (GLUCOPHAGE ) 1000 MG tablet Take 1,000 mg by mouth 2 (two) times daily. 01/16/15   [provider]  metoprolol  succinate (TOPROL -XL) 100 MG 24 hr tablet Take 1 tablet (100 mg total) by mouth daily. Take with or immediately following a meal. 04/06/23   Dunn, Bernardino HERO, PA-C  omeprazole (PRILOSEC) 40 MG capsule Take 40 mg by mouth daily before  breakfast.     [provider]  sertraline (ZOLOFT) 50 MG tablet Take 50 mg by mouth daily. 03/04/19   [provider]    Physical Exam: Vitals:   03/26/24 2120 03/26/24 2123 03/26/24 2330  BP: 125/62  (!) 152/73  Pulse: 70  67  Resp: 18  13  Temp: 97.6 F (36.4 C)    TempSrc: Oral    SpO2: 99%  100%  Weight:  108.9 kg   Height:  6' (1.829 m)    General: Not in acute distress HEENT:       Eyes: PERRL, EOMI, no jaundice       ENT: No discharge from the ears and nose, no pharynx injection, no tonsillar enlargement.        Neck: No JVD, no bruit, no mass felt. Heme: No neck lymph node enlargement. Cardiac: S1/S2, RRR, No gallops or rubs. Respiratory: No rales, wheezing, rhonchi or rubs. GI: Soft, nondistended, nontender, no organomegaly, BS present. GU: No hematuria Ext: No pitting leg edema bilaterally. 1+DP/PT pulse bilaterally. Musculoskeletal: No joint deformities, No joint redness or warmth, no limitation of ROM in spin. Skin: No rashes.  Neuro: Confused, knows his own name, knows that he is in hospital, but not oriented to time. Cranial nerves II-XII grossly intact, moves all extremities normally. Psych: Patient is not psychotic, no suicidal or hemocidal ideation.  Labs on Admission: I have personally reviewed following labs and imaging studies  CBC: Recent Labs  Lab 03/26/24 2138  WBC 5.7  NEUTROABS 3.4  HGB 10.6*  HCT 33.4*  MCV 96.0  PLT 179   Basic Metabolic Panel: Recent Labs  Lab 03/26/24 2138  NA 139  K 4.7  CL 107  CO2 23  GLUCOSE 75  BUN 26*  CREATININE 1.68*  CALCIUM  8.5*   GFR: Estimated Creatinine Clearance: 47.7 mL/min (A) (by C-G formula based on SCr of 1.68 mg/dL (H)). Liver Function Tests: Recent Labs  Lab 03/26/24 2138  AST 18  ALT 9  ALKPHOS 54  BILITOT 0.6  PROT 6.5  ALBUMIN  3.3*   No results for input(s): LIPASE, AMYLASE in the last 168 hours. No results for input(s): AMMONIA in the last 168  hours. Coagulation Profile: No results for input(s): INR, PROTIME in the last 168 hours. Cardiac Enzymes: No results for input(s): CKTOTAL, CKMB, CKMBINDEX, TROPONINI in the last 168 hours. BNP (last 3 results) No results for input(s): PROBNP in the last 8760 hours. HbA1C: No results for input(s): HGBA1C in the last 72 hours. CBG: Recent Labs  Lab 03/26/24 2117 03/26/24 2135 03/26/24 2222 03/26/24 2327  GLUCAP 81 77 54* 117*   Lipid Profile: No results for input(s): CHOL, HDL, LDLCALC, TRIG, CHOLHDL, LDLDIRECT in the last 72 hours. Thyroid  Function Tests: No results for input(s): TSH, T4TOTAL, FREET4, T3FREE, THYROIDAB in the last 72 hours. Anemia Panel: No results for input(s): VITAMINB12, FOLATE, FERRITIN, TIBC, IRON, RETICCTPCT in the last 72 hours. Urine analysis:    Component Value Date/Time   COLORURINE YELLOW 01/03/2013 1401   APPEARANCEUR CLEAR 01/03/2013 1401   APPEARANCEUR Clear 10/29/2011 0034   LABSPEC 1.013 01/03/2013 1401   LABSPEC 1.023 10/29/2011 0034   PHURINE 5.0 01/03/2013 1401   GLUCOSEU NEGATIVE 01/03/2013 1401   GLUCOSEU >=500 10/29/2011 0034   HGBUR NEGATIVE 01/03/2013 1401   BILIRUBINUR NEGATIVE 01/03/2013 1401   BILIRUBINUR Negative 10/29/2011 0034   KETONESUR NEGATIVE 01/03/2013 1401   PROTEINUR NEGATIVE 01/03/2013 1401   UROBILINOGEN 0.2 01/03/2013 1401   NITRITE NEGATIVE 01/03/2013 1401   LEUKOCYTESUR NEGATIVE 01/03/2013 1401   LEUKOCYTESUR Negative 10/29/2011 0034   Sepsis Labs: @LABRCNTIP (procalcitonin:4,lacticidven:4) )No results found for this or any previous visit (from the past 240 hours).   Radiological Exams on Admission:   Assessment/Plan Principal Problem:   Hypoglycemia Active Problems:   Acute metabolic encephalopathy   Type II diabetes mellitus with renal manifestations (HCC)   CAD (coronary artery disease)  Chronic diastolic CHF (congestive heart failure) (HCC)    History of atrial fibrillation   HTN (hypertension)   Dyslipidemia   Memory loss   Chronic kidney disease, stage 3a (HCC)   Obesity (BMI 30-39.9)   Assessment and Plan:  Hypoglycemia: Possibly due to inappropriate use of glipizide.  -Place the PCU for outpatient - Check blood sugar every hour for 2 times, then every 2 hour - D10 at 75 cc/h - As needed D50 - Hold glipizide  Acute metabolic encephalopathy: Likely due to hypoglycemia.  CT head negative.  - Frequent neurocheck - Fall precaution - f/u UA  Type II diabetes mellitus with renal manifestations [facility]): Recent A1c 6.1, well-controlled. -Hold glipizide and metformin   CAD (coronary artery disease) -Lipitor , Imdur   Chronic diastolic CHF (congestive heart failure) (HCC): 2D echo on 11/25/2021 showed EF of 55-60%.  Patient does not have leg edema JVD.  No oxygen desaturation.  CHF seems to be compensated.  Patient is taking as needed Lasix . -Watch volume status closely.  History of atrial fibrillation: Heart rate 70s -Continue Eliquis  - Amiodarone , metoprolol   HTN (hypertension) -IV hydralazine  as needed - Amlodipine , Imdur , Cozaar , metoprolol   Dyslipidemia -Lipitor   Memory loss -Namenda  Chronic kidney disease, stage 3a (HCC): Renal function is worsening baseline.  Baseline creatinine 1.1-1.3 recently.  His creatinine is 1.68, BUN 26, GFR 54.  Likely due to dehydration and continuation of Cozaar , Lasix . -Hold Cozaar  and Lasix  - Patient is on IV fluid as above  Obesity (BMI 30-39.9): Patient has Obesity Class I, with body weight 108  Kg and BMI  32.55 kg/m2.  - Encourage losing weight - Exercise and healthy diet     DVT ppx: on Eliquis   Code Status: Full code     Family Communication:     Yes, patient's sister-in-law   at bed side.    Disposition Plan:  Anticipate discharge back to previous environment  Consults called:  none  Admission status and Level of care: Progressive:    for obs        Dispo: The patient is from: Home              Anticipated d/c is to: Home              Anticipated d/c date is: 1 day              Patient currently is not medically stable to d/c.    Severity of Illness:  The appropriate patient status for this patient is OBSERVATION. Observation status is judged to be reasonable and necessary in order to provide the required intensity of service to ensure the patient's safety. The patient's presenting symptoms, physical exam findings, and initial radiographic and laboratory data in the context of their medical condition is felt to place them at decreased risk for further clinical deterioration. Furthermore, it is anticipated that the patient will be medically stable for discharge from the hospital within 2 midnights of admission.        Date of Service 03/27/2024    Caleb Exon [facility]   If 7PM-7AM, please contact night-coverage [email or web address] 03/27/2024, 1:22 AM

## 2024-03-26 NOTE — ED Provider Notes (Signed)
 [facility] Provider Note    Event Date/Time   First MD Initiated Contact with Patient 03/26/24 2118     (approximate)   History   Altered Mental Status and Near Syncope   HPI  Aaron Jones is a 77 year old male with history of cognitive impairment, A-fib, hypertension, T2DM presenting to the emergency department for evaluation of altered mental status.  Patient was found confused by family members.  They thought he might be hypoglycemic but their glucometer was not working so they gave him several things to eat.  For EMS initial BGL was 135, downtrended to 80 prior to arrival.  Received 500 of LR with EMS.  Negative stroke screen with EMS.      Physical Exam   Triage Vital Signs: ED Triage Vitals  Encounter Vitals Group     BP 03/26/24 2120 125/62     Girls Systolic BP Percentile --      Girls Diastolic BP Percentile --      Boys Systolic BP Percentile --      Boys Diastolic BP Percentile --      Pulse Rate 03/26/24 2120 70     Resp 03/26/24 2120 18     Temp 03/26/24 2120 97.6 F (36.4 C)     Temp Source 03/26/24 2120 Oral     SpO2 03/26/24 2120 99 %     Weight 03/26/24 2123 240 lb (108.9 kg)     Height 03/26/24 2123 6' (1.829 m)     Head Circumference --      Peak Flow --      Pain Score 03/26/24 2123 0     Pain Loc --      Pain Education --      Exclude from Growth Chart --     Most recent vital signs: Vitals:   03/26/24 2120 03/26/24 2330  BP: 125/62 (!) 152/73  Pulse: 70 67  Resp: 18 13  Temp: 97.6 F (36.4 C)   SpO2: 99% 100%     General: Somnolent but arousable CV:  Good peripheral perfusion Resp:  Unlabored respirations, lungs clear to auscultation Abd:  Nondistended, soft, nontender Neuro:  Arouses to minor stimulation, reports age is 39, month is September, able to blink eyes and squeeze hands, normal horizontal extraocular movements, no visual field loss, normal facial symmetry, no arm or leg motor drift, no limb  ataxia, normal sensation, no aphasia, no dysarthria, no inattention.   ED Results / Procedures / Treatments   Labs (all labs ordered are listed, but only abnormal results are displayed) Labs Reviewed  CBC WITH DIFFERENTIAL/PLATELET - Abnormal; Notable for the following components:      Result Value   RBC 3.48 (*)    Hemoglobin 10.6 (*)    HCT 33.4 (*)    All other components within normal limits  COMPREHENSIVE METABOLIC PANEL WITH GFR - Abnormal; Notable for the following components:   BUN 26 (*)    Creatinine, Ser 1.68 (*)    Calcium  8.5 (*)    Albumin  3.3 (*)    GFR, Estimated 42 (*)    All other components within normal limits  CBG MONITORING, ED - Abnormal; Notable for the following components:   Glucose-Capillary 54 (*)    All other components within normal limits  CBG MONITORING, ED - Abnormal; Notable for the following components:   Glucose-Capillary 117 (*)    All other components within normal limits  URINALYSIS, W/ REFLEX TO CULTURE (INFECTION SUSPECTED)  BASIC METABOLIC PANEL WITH GFR  CBC  CBG MONITORING, ED  CBG MONITORING, ED     EKG EKG independently reviewed and interpreted by myself demonstrates:  EKG demonstrates sinus rhythm at a rate of 69, PR 118, QRS 196, QTc 549, right bundle and left anterior fascicular block noted, similar to prior from 02/29/2024  RADIOLOGY Imaging independently reviewed and interpreted by myself demonstrates:  CT head without acute bleed  Formal Radiology Read:  CT Head Wo Contrast Result Date: 03/26/2024 CLINICAL DATA:  Altered mental status and low blood glucose, initial encounter EXAM: CT HEAD WITHOUT CONTRAST TECHNIQUE: Contiguous axial images were obtained from the base of the skull through the vertex without intravenous contrast. RADIATION DOSE REDUCTION: This exam was performed according to the departmental dose-optimization program which includes automated exposure control, adjustment of the mA and/or kV according to  patient size and/or use of iterative reconstruction technique. COMPARISON:  01/14/2024 MRI, CT from 01/10/2021 FINDINGS: Brain: No evidence of acute infarction, hemorrhage, hydrocephalus, extra-axial collection or mass lesion/mass effect. Mild atrophic changes are noted. Mild chronic white matter ischemic changes are noted as well. Vascular: No hyperdense vessel or unexpected calcification. Skull: Normal. Negative for fracture or focal lesion. Sinuses/Orbits: No acute finding. Other: None. IMPRESSION: Chronic atrophic and ischemic changes without acute abnormality. Electronically Signed   By: Oneil Devonshire M.D.   On: 03/26/2024 22:05    PROCEDURES:  Critical Care performed: No  Procedures   MEDICATIONS ORDERED IN ED: Medications  dextrose  10 % infusion ( Intravenous New Bag/Given 03/26/24 2238)  octreotide (SANDOSTATIN) injection 50 mcg (has no administration in time range)  dextrose  50 % solution 50 mL (has no administration in time range)  diphenhydrAMINE  (BENADRYL ) injection 12.5 mg (has no administration in time range)  hydrALAZINE  (APRESOLINE ) injection 5 mg (has no administration in time range)  acetaminophen  (TYLENOL ) tablet 650 mg (has no administration in time range)  albuterol  (PROVENTIL ) (2.5 MG/3ML) 0.083% nebulizer solution 2.5 mg (has no administration in time range)  dextromethorphan-guaiFENesin (MUCINEX DM) 30-600 MG per 12 hr tablet 1 tablet (has no administration in time range)  dextrose  50 % solution 50 mL (50 mLs Intravenous Given 03/26/24 2231)     IMPRESSION / MDM / ASSESSMENT AND PLAN / ED COURSE  I reviewed the triage vital signs and the nursing notes.  Differential diagnosis includes, but is not limited to, intracranial bleed, electrolyte abnormality, infection, hypoglycemia, medication intoxication or withdrawal, CVA, TIA  Patient's presentation is most consistent with acute presentation with potential threat to life or bodily function.  78 year old male  presenting to the emergency department for evaluation of altered mental status.  On arrival patient is somnolent but arousable.  He is confused on exam, does have known history of cognitive impairment, but does seem worse at her baseline from family collateral.  No focal deficits on exam, lower suspicion acute CVA.  Here, patient did have downtrending glucose to 54, given an amp of D50 and started on D10 infusion.  Concern for possible slim fungal urea toxicity.  Labs do have slightly worsened renal impairment with creatinine of 1.68.  Ordered for dose of octreotide.  Case discussed with Dr. Hilma.  He will evaluate for anticipated admission in the setting of altered mental status with hypoglycemia.     FINAL CLINICAL IMPRESSION(S) / ED DIAGNOSES   Final diagnoses:  Hypoglycemia  Acute encephalopathy     Rx / DC Orders   ED Discharge Orders     None  Note:  This document was prepared using Dragon voice recognition software and may include unintentional dictation errors.   Levander Slate, MD 03/26/24 [phone]

## 2024-03-27 DIAGNOSIS — R413 Other amnesia: Secondary | ICD-10-CM

## 2024-03-27 DIAGNOSIS — N1831 Chronic kidney disease, stage 3a: Secondary | ICD-10-CM

## 2024-03-27 DIAGNOSIS — Z6832 Body mass index (BMI) 32.0-32.9, adult: Secondary | ICD-10-CM | POA: Diagnosis not present

## 2024-03-27 DIAGNOSIS — I10 Essential (primary) hypertension: Secondary | ICD-10-CM

## 2024-03-27 DIAGNOSIS — E162 Hypoglycemia, unspecified: Secondary | ICD-10-CM | POA: Diagnosis not present

## 2024-03-27 DIAGNOSIS — H9190 Unspecified hearing loss, unspecified ear: Secondary | ICD-10-CM | POA: Diagnosis present

## 2024-03-27 DIAGNOSIS — I272 Pulmonary hypertension, unspecified: Secondary | ICD-10-CM | POA: Diagnosis present

## 2024-03-27 DIAGNOSIS — I5032 Chronic diastolic (congestive) heart failure: Secondary | ICD-10-CM

## 2024-03-27 DIAGNOSIS — G9341 Metabolic encephalopathy: Secondary | ICD-10-CM | POA: Diagnosis present

## 2024-03-27 DIAGNOSIS — I080 Rheumatic disorders of both mitral and aortic valves: Secondary | ICD-10-CM | POA: Diagnosis present

## 2024-03-27 DIAGNOSIS — E860 Dehydration: Secondary | ICD-10-CM | POA: Diagnosis present

## 2024-03-27 DIAGNOSIS — I2510 Atherosclerotic heart disease of native coronary artery without angina pectoris: Secondary | ICD-10-CM | POA: Diagnosis present

## 2024-03-27 DIAGNOSIS — I130 Hypertensive heart and chronic kidney disease with heart failure and stage 1 through stage 4 chronic kidney disease, or unspecified chronic kidney disease: Secondary | ICD-10-CM | POA: Diagnosis present

## 2024-03-27 DIAGNOSIS — N189 Chronic kidney disease, unspecified: Secondary | ICD-10-CM

## 2024-03-27 DIAGNOSIS — J449 Chronic obstructive pulmonary disease, unspecified: Secondary | ICD-10-CM | POA: Diagnosis present

## 2024-03-27 DIAGNOSIS — N179 Acute kidney failure, unspecified: Secondary | ICD-10-CM

## 2024-03-27 DIAGNOSIS — E785 Hyperlipidemia, unspecified: Secondary | ICD-10-CM | POA: Diagnosis present

## 2024-03-27 DIAGNOSIS — Z7984 Long term (current) use of oral hypoglycemic drugs: Secondary | ICD-10-CM | POA: Diagnosis not present

## 2024-03-27 DIAGNOSIS — E66811 Obesity, class 1: Secondary | ICD-10-CM | POA: Diagnosis present

## 2024-03-27 DIAGNOSIS — E1122 Type 2 diabetes mellitus with diabetic chronic kidney disease: Secondary | ICD-10-CM | POA: Diagnosis present

## 2024-03-27 DIAGNOSIS — E669 Obesity, unspecified: Secondary | ICD-10-CM

## 2024-03-27 DIAGNOSIS — Z7901 Long term (current) use of anticoagulants: Secondary | ICD-10-CM | POA: Diagnosis not present

## 2024-03-27 DIAGNOSIS — I422 Other hypertrophic cardiomyopathy: Secondary | ICD-10-CM | POA: Diagnosis present

## 2024-03-27 DIAGNOSIS — E11649 Type 2 diabetes mellitus with hypoglycemia without coma: Secondary | ICD-10-CM | POA: Diagnosis present

## 2024-03-27 DIAGNOSIS — I480 Paroxysmal atrial fibrillation: Secondary | ICD-10-CM | POA: Diagnosis present

## 2024-03-27 DIAGNOSIS — I452 Bifascicular block: Secondary | ICD-10-CM | POA: Diagnosis present

## 2024-03-27 DIAGNOSIS — Z8679 Personal history of other diseases of the circulatory system: Secondary | ICD-10-CM

## 2024-03-27 DIAGNOSIS — Z833 Family history of diabetes mellitus: Secondary | ICD-10-CM | POA: Diagnosis not present

## 2024-03-27 DIAGNOSIS — Z951 Presence of aortocoronary bypass graft: Secondary | ICD-10-CM | POA: Diagnosis not present

## 2024-03-27 DIAGNOSIS — T383X5A Adverse effect of insulin and oral hypoglycemic [antidiabetic] drugs, initial encounter: Secondary | ICD-10-CM | POA: Diagnosis present

## 2024-03-27 DIAGNOSIS — R55 Syncope and collapse: Secondary | ICD-10-CM | POA: Diagnosis present

## 2024-03-27 LAB — CBC
HCT: 30.3 % — ABNORMAL LOW (ref 39.0–52.0)
Hemoglobin: 9.8 g/dL — ABNORMAL LOW (ref 13.0–17.0)
MCH: 30.5 pg (ref 26.0–34.0)
MCHC: 32.3 g/dL (ref 30.0–36.0)
MCV: 94.4 fL (ref 80.0–100.0)
Platelets: 182 K/uL (ref 150–400)
RBC: 3.21 MIL/uL — ABNORMAL LOW (ref 4.22–5.81)
RDW: 13.6 % (ref 11.5–15.5)
WBC: 4 K/uL (ref 4.0–10.5)
nRBC: 0 % (ref 0.0–0.2)

## 2024-03-27 LAB — BASIC METABOLIC PANEL WITH GFR
Anion gap: 8 (ref 5–15)
BUN: 27 mg/dL — ABNORMAL HIGH (ref 8–23)
CO2: 25 mmol/L (ref 22–32)
Calcium: 8.2 mg/dL — ABNORMAL LOW (ref 8.9–10.3)
Chloride: 106 mmol/L (ref 98–111)
Creatinine, Ser: 1.61 mg/dL — ABNORMAL HIGH (ref 0.61–1.24)
GFR, Estimated: 44 mL/min — ABNORMAL LOW (ref >60)
Glucose, Bld: 102 mg/dL — ABNORMAL HIGH (ref 70–99)
Potassium: 3.8 mmol/L (ref 3.5–5.1)
Sodium: 139 mmol/L (ref 135–145)

## 2024-03-27 LAB — CBG MONITORING, ED
Glucose-Capillary: 164 mg/dL — ABNORMAL HIGH (ref 70–99)
Glucose-Capillary: 129 mg/dL — ABNORMAL HIGH (ref 70–99)
Glucose-Capillary: 129 mg/dL — ABNORMAL HIGH (ref 70–99)
Glucose-Capillary: 111 mg/dL — ABNORMAL HIGH (ref 70–99)
Glucose-Capillary: 143 mg/dL — ABNORMAL HIGH (ref 70–99)
Glucose-Capillary: 116 mg/dL — ABNORMAL HIGH (ref 70–99)
Glucose-Capillary: 129 mg/dL — ABNORMAL HIGH (ref 70–99)
Glucose-Capillary: 201 mg/dL — ABNORMAL HIGH (ref 70–99)
Glucose-Capillary: 188 mg/dL — ABNORMAL HIGH (ref 70–99)

## 2024-03-27 LAB — URINALYSIS, W/ REFLEX TO CULTURE (INFECTION SUSPECTED)
Bilirubin Urine: NEGATIVE
Glucose, UA: NEGATIVE mg/dL
Hgb urine dipstick: NEGATIVE
Ketones, ur: NEGATIVE mg/dL
Nitrite: NEGATIVE
Protein, ur: NEGATIVE mg/dL
Specific Gravity, Urine: 1.006 (ref 1.005–1.030)
Squamous Epithelial / HPF: 0 /HPF (ref 0–5)
pH: 5 (ref 5.0–8.0)

## 2024-03-27 LAB — HEMOGLOBIN A1C
Hgb A1c MFr Bld: 6.2 % — ABNORMAL HIGH (ref 4.8–5.6)
Mean Plasma Glucose: 131.24 mg/dL

## 2024-03-27 LAB — BRAIN NATRIURETIC PEPTIDE: B Natriuretic Peptide: 379 pg/mL — ABNORMAL HIGH (ref 0.0–100.0)

## 2024-03-27 LAB — MAGNESIUM: Magnesium: 1.9 mg/dL (ref 1.7–2.4)

## 2024-03-27 MED ORDER — METOPROLOL SUCCINATE ER 100 MG PO TB24
100.0000 mg | ORAL_TABLET | Freq: Every day | ORAL | Status: DC
  Administered 2024-03-27 – 2024-03-29 (×3): 100 mg via ORAL
  Filled 2024-03-27 (×2): qty 2
  Filled 2024-03-27: qty 1

## 2024-03-27 MED ORDER — ISOSORBIDE MONONITRATE ER 30 MG PO TB24
15.0000 mg | ORAL_TABLET | Freq: Every day | ORAL | Status: DC
  Administered 2024-03-27 – 2024-03-29 (×3): 15 mg via ORAL
  Filled 2024-03-27 (×3): qty 1

## 2024-03-27 MED ORDER — SERTRALINE HCL 50 MG PO TABS
50.0000 mg | ORAL_TABLET | Freq: Every day | ORAL | Status: DC
  Administered 2024-03-27 – 2024-03-29 (×3): 50 mg via ORAL
  Filled 2024-03-27 (×3): qty 1

## 2024-03-27 MED ORDER — APIXABAN 5 MG PO TABS
5.0000 mg | ORAL_TABLET | Freq: Two times a day (BID) | ORAL | Status: DC
  Administered 2024-03-27 – 2024-03-29 (×6): 5 mg via ORAL
  Filled 2024-03-27 (×6): qty 1

## 2024-03-27 MED ORDER — AMLODIPINE BESYLATE 5 MG PO TABS
2.5000 mg | ORAL_TABLET | Freq: Every day | ORAL | Status: DC
  Administered 2024-03-27 – 2024-03-29 (×3): 2.5 mg via ORAL
  Filled 2024-03-27 (×3): qty 1

## 2024-03-27 MED ORDER — LOSARTAN POTASSIUM 50 MG PO TABS: 100.0000 mg | ORAL_TABLET | Freq: Every day | ORAL | Status: DC

## 2024-03-27 MED ORDER — MEMANTINE HCL 10 MG PO TABS
5.0000 mg | ORAL_TABLET | Freq: Two times a day (BID) | ORAL | Status: DC
  Administered 2024-03-27 – 2024-03-29 (×6): 5 mg via ORAL
  Filled 2024-03-27 (×6): qty 1

## 2024-03-27 MED ORDER — PANTOPRAZOLE SODIUM 40 MG PO TBEC
40.0000 mg | DELAYED_RELEASE_TABLET | Freq: Every day | ORAL | Status: DC
  Administered 2024-03-27 – 2024-03-29 (×3): 40 mg via ORAL
  Filled 2024-03-27 (×3): qty 1

## 2024-03-27 MED ORDER — AMIODARONE HCL 200 MG PO TABS
200.0000 mg | ORAL_TABLET | Freq: Every day | ORAL | Status: DC
  Administered 2024-03-27 – 2024-03-29 (×3): 200 mg via ORAL
  Filled 2024-03-27 (×3): qty 1

## 2024-03-27 MED ORDER — ATORVASTATIN CALCIUM 20 MG PO TABS
20.0000 mg | ORAL_TABLET | Freq: Every day | ORAL | Status: DC
  Administered 2024-03-27 – 2024-03-28 (×3): 20 mg via ORAL
  Filled 2024-03-27 (×3): qty 1

## 2024-03-27 NOTE — H&P (Incomplete)
 History and Physical    Aaron Jones FMW:[id number] DOB: 1945-06-13 DOA: 03/26/2024  Referring MD/NP/PA:   PCP: Rudolpho Norleen BIRCH, MD   Patient coming from:  The patient is coming from home.     Chief Complaint: AMS  HPI: Aaron Jones is a 78 y.o. male with medical history significant of DM, HTN, HLD,  COPD, CAD, CABG, dCHF,      Data reviewed independently and ED Course: pt was found to have     ***       EKG: I have personally reviewed.  Not done in ED, will get one.   ***   Review of Systems:   General: no fevers, chills, no body weight gain, has poor appetite, has fatigue HEENT: no blurry vision, hearing changes or sore throat Respiratory: no dyspnea, coughing, wheezing CV: no chest pain, no palpitations GI: no nausea, vomiting, abdominal pain, diarrhea, constipation GU: no dysuria, burning on urination, increased urinary frequency, hematuria  Ext: no leg edema Neuro: no unilateral weakness, numbness, or tingling, no vision change or hearing loss Skin: no rash, no skin tear. MSK: No muscle spasm, no deformity, no limitation of range of movement in spin Heme: No easy bruising.  Travel history: No recent long distant travel.   Allergy:  Allergies  Allergen Reactions  . Biaxin [Clarithromycin] Nausea Only    Past Medical History:  Diagnosis Date  . Atrial fibrillation with rapid ventricular response (HCC) 01/16/2013   a. 01/2013 s/p R & L Maze and LAA clipping in setting of VSD repair;  b. Prev on amiodarone ;  c. CHA2DS2VASc = 4-->Apixaban ;  c. 01/2013 Recurrent afib/flutter requiring DCCV.  SABRA CAD (coronary artery disease)   . CHF (congestive heart failure) (HCC)   . Cognitive and behavioral changes   . Colon polyp   . COPD (chronic obstructive pulmonary disease) (HCC)   . Coronary artery disease    a. 01/2013 s/p CABG x 1 (VG->PDA) in setting of VSD repair.  . Edentulous   . GERD (gastroesophageal reflux disease)   . Hearing loss   . Hx of CABG   .  Hyperlipidemia   . Hypertension    PCP- FOYE Vannie GLENWOOD [place], phone; [phone]  . Hypertrophic cardiomyopathy (HCC)   . Left to right cardiovascular shunt   . Loss of memory   . Mitral valve disorder   . Moderate aortic stenosis by prior echocardiogram   . Moderate pulmonary hypertension (HCC)   . Moderate to severe mitral regurgitation   . Obesity   . Paroxysmal atrial fibrillation (HCC)   . PAT (paroxysmal atrial tachycardia)    a. 02/2015 s/p DCCV;  b. 12/2016 recurrent PAT - unchanged with escalating BB dose (rates 106-107).  SABRA PSVT (paroxysmal supraventricular tachycardia)    a. 10/2011 Admitted to [facility] w/ SVT-->broke with IV dilt.  . Recurrent Nephrolithiasis   . S/P Maze operation for atrial fibrillation   . S/P VSD closure   . Schatzki's ring   . Sleep apnea    [facility]- in process of being evaluated now, states 12 yrs. ago was on CPAP but after having his deviated septum repaired, he had put the CPAP in storage. Pt. will get a new machine soon.   . Thoracic aortic aneurysm    a. 01/2016 CTA chest: 4.0 cm Asc Ao; b. 01/2017 Echo: nl Ao root size.  . Type II diabetes mellitus (HCC)   . Umbilical hernia    not repaired (01/16/2013)  . Unsteady gait   .  VSD (ventricular septal defect) w/ persistent systolic murmur    a. 01/2013 s/p VSD closure (dacron patch);  b. 01/2017 Echo: EF 50-55%, no rwma, mild AS, mild MR, mod dil LA, dilated RV, sev dil RA, mild TR. PASP nl.    Past Surgical History:  Procedure Laterality Date  . CARDIAC CATHETERIZATION  > 5 yr ago   Done at [employer], reportedly clean  . CARDIAC CATHETERIZATION  11/03/2012  . CARDIOVERSION N/A 02/12/2015   Procedure: CARDIOVERSION;  Surgeon: Aleene JINNY Passe, MD;  Location: [facility] ENDOSCOPY;  Service: Cardiovascular;  Laterality: N/A;  . CARDIOVERSION N/A 04/27/2017   Procedure: CARDIOVERSION;  Surgeon: Waddell Danelle ORN, MD;  Location: [facility] INVASIVE CV LAB;  Service: Cardiovascular;  Laterality: N/A;  . CATARACT EXTRACTION  W/PHACO Left 02/12/2024   Procedure: PHACOEMULSIFICATION, CATARACT, WITH IOL INSERTION 25.50 01:56.1;  Surgeon: Jaye Fallow, MD;  Location: [facility] SURGERY CNTR;  Service: Ophthalmology;  Laterality: Left;  . CATARACT EXTRACTION W/PHACO Right 02/26/2024   Procedure: PHACOEMULSIFICATION, CATARACT, WITH IOL INSERTION 8.41 00:56.6;  Surgeon: Jaye Fallow, MD;  Location: [facility];  Service: Ophthalmology;  Laterality: Right;  . CLIPPING OF ATRIAL APPENDAGE N/A 01/07/2013   Procedure: CLIPPING OF ATRIAL APPENDAGE;  Surgeon: Dallas KATHEE Jude, MD;  Location: [facility] OR;  Service: Open Heart Surgery;  Laterality: N/A;  . COLONOSCOPY W/ POLYPECTOMY    . COLONOSCOPY WITH PROPOFOL  N/A 01/05/2021   Procedure: COLONOSCOPY WITH PROPOFOL ;  Surgeon: Toledo, Ladell POUR, MD;  Location: [facility] ENDOSCOPY;  Service: Gastroenterology;  Laterality: N/A;  . CORONARY ARTERY BYPASS GRAFT N/A 01/07/2013   Procedure: CORONARY ARTERY BYPASS GRAFTING (CABG);  Surgeon: Dallas KATHEE Jude, MD;  Location: [facility] OR;  Service: Open Heart Surgery;  Laterality: N/A;  Times1 using endoscopically harvested saphenous vein graft to the PDA  . ESOPHAGOGASTRODUODENOSCOPY N/A 01/05/2021   Procedure: ESOPHAGOGASTRODUODENOSCOPY (EGD);  Surgeon: Toledo, Ladell POUR, MD;  Location: [facility] ENDOSCOPY;  Service: Gastroenterology;  Laterality: N/A;  DM  . EYE SURGERY Left 02/03/1989   clipped muscle so it wouldn't be pulling up (01/16/2013)  . INTRAOPERATIVE TRANSESOPHAGEAL ECHOCARDIOGRAM N/A 01/07/2013   Procedure: INTRAOPERATIVE TRANSESOPHAGEAL ECHOCARDIOGRAM;  Surgeon: Dallas KATHEE Jude, MD;  Location: [facility] OR;  Service: Open Heart Surgery;  Laterality: N/A;  . LEFT AND RIGHT HEART CATHETERIZATION WITH CORONARY ANGIOGRAM N/A 11/20/2012   Procedure: LEFT AND RIGHT HEART CATHETERIZATION WITH CORONARY ANGIOGRAM;  Surgeon: Toribio JONELLE Fuel, MD;  Location: [facility] CATH LAB;  Service: Cardiovascular;  Laterality: N/A;  . MAZE N/A 01/07/2013   Procedure:  MAZE;  Surgeon: Dallas KATHEE Jude, MD;  Location: [facility] OR;  Service: Open Heart Surgery;  Laterality: N/A;  . MULTIPLE EXTRACTIONS WITH ALVEOLOPLASTY N/A 12/11/2012   Procedure: Extraction of tooth #'s 2,3,4,5,14,15,17,21,22,23,24,25,26,27,32 wioth alveoloplasty and bialteral fibrous tuberosity reductions.;  Surgeon: Tanda JULIANNA Fanny, DDS;  Location: [facility] ORS;  Service: Oral Surgery;  Laterality: N/A;  . NASAL SEPTUM SURGERY  06/05/2002  . RIGHT/LEFT HEART CATH AND CORONARY ANGIOGRAPHY N/A 08/02/2021   Procedure: RIGHT/LEFT HEART CATH AND CORONARY ANGIOGRAPHY;  Surgeon: Mady Bruckner, MD;  Location: [facility] INVASIVE CV LAB;  Service: Cardiovascular;  Laterality: N/A;  . sepfal deveation repair    . TEE WITHOUT CARDIOVERSION N/A 11/21/2012   Procedure: TRANSESOPHAGEAL ECHOCARDIOGRAM (TEE);  Surgeon: Toribio JONELLE Fuel, MD;  Location: [facility] ENDOSCOPY;  Service: Cardiovascular;  Laterality: N/A;  . VSD REPAIR N/A 01/07/2013   Procedure: VENTRICULAR SEPTAL DEFECT (VSD) REPAIR;  Surgeon: Dallas KATHEE Jude, MD;  Location: [facility] OR;  Service: Open Heart Surgery;  Laterality: N/A;  . VSD REPAIR      Social History:  reports that he has never smoked. He has never been exposed to tobacco smoke. He has never used smokeless tobacco. He reports that he does not drink alcohol  and does not use drugs.  Family History:  Family History  Problem Relation Age of Onset  . Cancer Mother   . Diabetes Mother      Prior to Admission medications   Medication Sig Start Date End Date Taking? Authorizing Provider  amiodarone  (PACERONE ) 200 MG tablet Take 1 tablet (200 mg total) by mouth daily. 11/30/21   Furth, Cadence H, PA-C  amLODipine  (NORVASC ) 2.5 MG tablet Take by mouth daily. 08/01/23 07/31/24  [provider]  apixaban  (ELIQUIS ) 5 MG TABS tablet Take 1 tablet (5 mg total) by mouth 2 (two) times daily. 08/21/23   Dunn, Bernardino HERO, PA-C  atorvastatin  (LIPITOR ) 20 MG tablet Take 1 tablet (20 mg total) by mouth at  bedtime. 01/06/19   Vivienne Lonni Ingle, NP  furosemide  (LASIX ) 40 MG tablet Take 0.5 tablets (20 mg total) by mouth daily. 09/08/22   Dunn, Bernardino HERO, PA-C  glipiZIDE (GLUCOTROL) 5 MG tablet Take by mouth daily before breakfast.    [provider]  Glucose Blood (BLOOD GLUCOSE TEST STRIPS) STRP 1 strip by In Vitro route 2 (two) times daily. 11/25/12   Pietro Redell RAMAN, MD  HYDROcodone -acetaminophen  (NORCO/VICODIN) 5-325 MG tablet Take 1 tablet by mouth as needed. 02/27/24   [provider]  isosorbide  mononitrate (IMDUR ) 30 MG 24 hr tablet Take 0.5 tablets (15 mg total) by mouth daily. 08/02/21   End, Lonni, MD  losartan  (COZAAR ) 100 MG tablet Take 1 tablet (100 mg total) by mouth daily. 12/06/23 03/05/24  Abigail Bernardino HERO, PA-C  memantine (NAMENDA) 5 MG tablet Take 5 mg by mouth 2 (two) times daily. 01/09/24   [provider]  metFORMIN  (GLUCOPHAGE ) 1000 MG tablet Take 1,000 mg by mouth 2 (two) times daily. 01/16/15   [provider]  metoprolol  succinate (TOPROL -XL) 100 MG 24 hr tablet Take 1 tablet (100 mg total) by mouth daily. Take with or immediately following a meal. 04/06/23   Dunn, Bernardino HERO, PA-C  omeprazole (PRILOSEC) 40 MG capsule Take 40 mg by mouth daily before breakfast.     [provider]  sertraline (ZOLOFT) 50 MG tablet Take 50 mg by mouth daily. 03/04/19   [provider]    Physical Exam: Vitals:   03/26/24 2120 03/26/24 2123  BP: 125/62   Pulse: 70   Resp: 18   Temp: 97.6 F (36.4 C)   TempSrc: Oral   SpO2: 99%   Weight:  108.9 kg  Height:  6' (1.829 m)   General: Not in acute distress HEENT:       Eyes: PERRL, EOMI, no jaundice       ENT: No discharge from the ears and nose, no pharynx injection, no tonsillar enlargement.        Neck: No JVD, no bruit, no mass felt. Heme: No neck lymph node enlargement. Cardiac: S1/S2, RRR, No murmurs, No gallops or rubs. Respiratory: No rales, wheezing, rhonchi or rubs. GI: Soft,  nondistended, nontender, no rebound pain, no organomegaly, BS present. GU: No hematuria Ext: No pitting leg edema bilaterally. 1+DP/PT pulse bilaterally. Musculoskeletal: No joint deformities, No joint redness or warmth, no limitation of ROM in spin. Skin: No rashes.  Neuro: Alert, oriented X3, cranial nerves II-XII grossly intact, moves all extremities  normally. Muscle strength 5/5 in all extremities, sensation to light touch intact. Brachial reflex 2+ bilaterally. Knee reflex 1+ bilaterally. Negative Babinski's sign. Normal finger to nose test. Psych: Patient is not psychotic, no suicidal or hemocidal ideation.  Labs on Admission: I have personally reviewed following labs and imaging studies  CBC: Recent Labs  Lab 03/26/24 2138  WBC 5.7  NEUTROABS 3.4  HGB 10.6*  HCT 33.4*  MCV 96.0  PLT 179   Basic Metabolic Panel: Recent Labs  Lab 03/26/24 2138  NA 139  K 4.7  CL 107  CO2 23  GLUCOSE 75  BUN 26*  CREATININE 1.68*  CALCIUM  8.5*   GFR: Estimated Creatinine Clearance: 47.7 mL/min (A) (by C-G formula based on SCr of 1.68 mg/dL (H)). Liver Function Tests: Recent Labs  Lab 03/26/24 2138  AST 18  ALT 9  ALKPHOS 54  BILITOT 0.6  PROT 6.5  ALBUMIN  3.3*   No results for input(s): LIPASE, AMYLASE in the last 168 hours. No results for input(s): AMMONIA in the last 168 hours. Coagulation Profile: No results for input(s): INR, PROTIME in the last 168 hours. Cardiac Enzymes: No results for input(s): CKTOTAL, CKMB, CKMBINDEX, TROPONINI in the last 168 hours. BNP (last 3 results) No results for input(s): PROBNP in the last 8760 hours. HbA1C: No results for input(s): HGBA1C in the last 72 hours. CBG: Recent Labs  Lab 03/26/24 2117 03/26/24 2135 03/26/24 2222 03/26/24 2327  GLUCAP 81 77 54* 117*   Lipid Profile: No results for input(s): CHOL, HDL, LDLCALC, TRIG, CHOLHDL, LDLDIRECT in the last 72 hours. Thyroid  Function Tests: No  results for input(s): TSH, T4TOTAL, FREET4, T3FREE, THYROIDAB in the last 72 hours. Anemia Panel: No results for input(s): VITAMINB12, FOLATE, FERRITIN, TIBC, IRON, RETICCTPCT in the last 72 hours. Urine analysis:    Component Value Date/Time   COLORURINE YELLOW 01/03/2013 1401   APPEARANCEUR CLEAR 01/03/2013 1401   APPEARANCEUR Clear 10/29/2011 0034   LABSPEC 1.013 01/03/2013 1401   LABSPEC 1.023 10/29/2011 0034   PHURINE 5.0 01/03/2013 1401   GLUCOSEU NEGATIVE 01/03/2013 1401   GLUCOSEU >=500 10/29/2011 0034   HGBUR NEGATIVE 01/03/2013 1401   BILIRUBINUR NEGATIVE 01/03/2013 1401   BILIRUBINUR Negative 10/29/2011 0034   KETONESUR NEGATIVE 01/03/2013 1401   PROTEINUR NEGATIVE 01/03/2013 1401   UROBILINOGEN 0.2 01/03/2013 1401   NITRITE NEGATIVE 01/03/2013 1401   LEUKOCYTESUR NEGATIVE 01/03/2013 1401   LEUKOCYTESUR Negative 10/29/2011 0034   Sepsis Labs: @LABRCNTIP (procalcitonin:4,lacticidven:4) )No results found for this or any previous visit (from the past 240 hours).   Radiological Exams on Admission:   Assessment/Plan Active Problems:   * No active hospital problems. *   Assessment and Plan: No notes have been filed under this hospital service. Service: Hospitalist      Active Problems:   * No active hospital problems. *    DVT ppx: SQ Heparin          SQ Lovenox   Code Status: Full code   ***  Family Communication:     not done, no family member is at bed side.              Yes, patient's    at bed side.       by phone   ***  Disposition Plan:  Anticipate discharge back to previous environment  Consults called:    Admission status and Level of care: :    for obs as inpt        Dispo: The patient is  from: {From:23814}              Anticipated d/c is to: {To:23815}              Anticipated d/c date is: {Days:23816}              Patient currently {Medically stable:23817}    Severity of  Illness:  {Observation/Inpatient:21159}       Date of Service 03/26/2024    Caleb Exon [facility]   If 7PM-7AM, please contact night-coverage [email or web address] 03/26/2024, 11:32 PM

## 2024-03-27 NOTE — Assessment & Plan Note (Addendum)
 Secondary to sulfonylurea and acute kidney injury (patient with history of type 2 diabetes mellitus).  Patient on D10 drip from admission to 10/25.  Upon stopping the 10 drip sugar was 129 and then 194.  Continue to hold glipizide as outpatient.  If sugars go above 200 as outpatient and consistently above then can restart Glucophage .

## 2024-03-27 NOTE — ED Notes (Signed)
 Assumed care of pt from [facility]. Introduced self to pt. Pt has call bell in reach. Pt has no needs or requests at this time.

## 2024-03-27 NOTE — Assessment & Plan Note (Addendum)
 Paroxysmal in nature on amiodarone , Toprol  and Eliquis 

## 2024-03-27 NOTE — ED Notes (Signed)
Cbg 201 

## 2024-03-27 NOTE — Assessment & Plan Note (Signed)
 On Norvasc  and Toprol 

## 2024-03-27 NOTE — Assessment & Plan Note (Signed)
 No current signs of heart failure

## 2024-03-27 NOTE — Assessment & Plan Note (Addendum)
 AKI on CKD stage IIIa.  Baseline creatinine around 1.3 with a GFR 5.7.  Creatinine peak at 1.68.  Last creatinine 1.11

## 2024-03-27 NOTE — Assessment & Plan Note (Signed)
 Outpatient follow up.

## 2024-03-27 NOTE — Assessment & Plan Note (Addendum)
 BMI 29.45

## 2024-03-27 NOTE — Assessment & Plan Note (Signed)
 On atorvastatin

## 2024-03-27 NOTE — Hospital Course (Addendum)
 78 y.o. male with medical history significant of DM, HTN, HLD,  COPD, CAD, CABG, dCHF, memory loss, CKD-3a, pulm HTN, mitral valve regurgitation, thoracic aneurysm, kidney stone, hypertrophic cardiomyopathy, hard of hearing, A-fib on Eliquis  (s/p Maze), VSD (s/p of closure 2014), PSVT, who presents with altered mental status.   Per her sister-in-law at the bedside, patient normally is alert and orientated x 3.  He takes care of his wife at home.  At about 6 PM, patient was found to be confused.  Patient had hypoglycemia causing confusion in the past. Family is not able to check his blood sugar levels since his glucometer was not working.  They think patient may have hypoglycemia, and started giving him bone to food.  Per EMS, his blood glucose was 135. His blood sugar dropped to 54 in ED.    When I saw patient in ED, patient is confused, knows his own name, knows that he is in the hospital, but not oriented to the time which is not normal to patient per his sister-in-law.  Patient does not seem to have chest pain or abdominal pain.  No active nausea, vomiting or diarrhea noted.  No active respiratory distress, cough noted.  Not sure if patient has symptoms of UTI.  He moves all extremities.   Data reviewed independently and ED Course: pt was found to have WBC 5.6, worsening renal function, temperature normal, bilateral pressure 125/62, heart rate 70, RR 18, oxygen saturation 99% on room air.  CT of head negative acute intracranial abnormalities.    10/23.  Patient takes glipizide and also has acute kidney injury found to have hypoglycemia on D10 drip.  Today's creatinine still elevated at 1.6.  Sugars still in the 100s. 10/24.  Sugars still in the 100s.  Patient on D10 drip.  Once we get her sugar above 200 we will stop to drip and continue to monitor for hypoglycemia. 10/25.  Discontinue D10 drip this a.m.  Sugar 129 at 1207 and 194 at 1451.  Stable for discharge home.  Do not restart glipizide.  Hold  Glucophage  for now.  Monitor sugars at home if sugars go above 200 and stay above 200 can restart Glucophage .

## 2024-03-27 NOTE — Assessment & Plan Note (Addendum)
 Secondary to low sugar.  Mental status improved from admission but does have some underlying memory issues.

## 2024-03-27 NOTE — Progress Notes (Signed)
 Progress Note   Patient: Aaron Jones FMW:[id number] DOB: 05-30-1946 DOA: 03/26/2024     0 DOS: the patient was seen and examined on 03/27/2024   Brief hospital course: 78 y.o. male with medical history significant of DM, HTN, HLD,  COPD, CAD, CABG, dCHF, memory loss, CKD-3a, pulm HTN, mitral valve regurgitation, thoracic aneurysm, kidney stone, hypertrophic cardiomyopathy, hard of hearing, A-fib on Eliquis  (s/p Maze), VSD (s/p of closure 2014), PSVT, who presents with altered mental status.   Per her sister-in-law at the bedside, patient normally is alert and orientated x 3.  He takes care of his wife at home.  At about 6 PM, patient was found to be confused.  Patient had hypoglycemia causing confusion in the past. Family is not able to check his blood sugar levels since his glucometer was not working.  They think patient may have hypoglycemia, and started giving him bone to food.  Per EMS, his blood glucose was 135. His blood sugar dropped to 54 in ED.    When I saw patient in ED, patient is confused, knows his own name, knows that he is in the hospital, but not oriented to the time which is not normal to patient per his sister-in-law.  Patient does not seem to have chest pain or abdominal pain.  No active nausea, vomiting or diarrhea noted.  No active respiratory distress, cough noted.  Not sure if patient has symptoms of UTI.  He moves all extremities.   Data reviewed independently and ED Course: pt was found to have WBC 5.6, worsening renal function, temperature normal, bilateral pressure 125/62, heart rate 70, RR 18, oxygen saturation 99% on room air.  CT of head negative acute intracranial abnormalities.    10/23.  Patient takes glipizide and also has acute kidney injury found to have hypoglycemia on D10 drip.  Today's creatinine still elevated at 1.6.  Sugars still in the 100s.  Assessment and Plan: * Hypoglycemia secondary to sulfonylurea and acute kidney injury (patient with history  of type 2 diabetes mellitus).  Patient on D10 drip.  Sugars still in the 100s.  Will continue to monitor.  Sometimes with acute kidney injury and sulfonylurea hypoglycemia we can get prolonged hypoglycemia for a few days.  Will continue to monitor closely.  Acute metabolic encephalopathy Secondary to low sugar  Acute kidney injury superimposed on CKD AKI on CKD stage IIIa.  Baseline creatinine around 1.3 with a GFR 5.7.  Creatinine peak at 1.68.  Current creatinine 1.61.  Chronic diastolic CHF (congestive heart failure) (HCC) No current signs of heart failure  HTN (hypertension) On Norvasc  and Toprol   History of atrial fibrillation Paroxysmal in nature on amiodarone , Toprol  and Eliquis   Dyslipidemia On atorvastatin   Memory loss Outpatient follow-up  Obesity (BMI 30-39.9) BMI 32.55        Subjective: Patient does not feel that he has low sugar.  Admitted with altered mental status and found to be hypoglycemic.  Also found to have elevated kidney function patient takes glipizide at home.  Physical Exam: Vitals:   03/27/24 0333 03/27/24 0700 03/27/24 0730 03/27/24 0909  BP:  (!) 118/56 139/60   Pulse:  66 65   Resp:  13 14   Temp: 98.5 F (36.9 C)   (!) 97.5 F (36.4 C)  TempSrc: Axillary   Oral  SpO2:  97% 98%   Weight:      Height:       Physical Exam HENT:     Head: Normocephalic.  Mouth/Throat:     Pharynx: No oropharyngeal exudate.  Eyes:     General: Lids are normal.     Conjunctiva/sclera: Conjunctivae normal.  Cardiovascular:     Rate and Rhythm: Normal rate and regular rhythm.     Heart sounds: S1 normal and S2 normal. Murmur heard.     Systolic murmur is present with a grade of 3/6.  Pulmonary:     Breath sounds: No decreased breath sounds, wheezing, rhonchi or rales.  Abdominal:     Palpations: Abdomen is soft.     Tenderness: There is no abdominal tenderness.  Musculoskeletal:     Right lower leg: Swelling present.     Left lower leg:  Swelling present.  Skin:    General: Skin is warm.     Findings: No rash.  Neurological:     Mental Status: He is alert.     Comments: Follow some simple commands, able to straight leg raise     Data Reviewed: Creatinine 1.61 electrolytes normal range, magnesium  1.9, GFR 44, white blood cell count 4.0, hemoglobin 9.8, platelet count 182, last sugar 111  Family Communication: Family at bedside  Disposition: Status is: Observation Sometimes with sulfonylurea and hypoglycemia we can get prolonged hypoglycemia.  Continue to watch on D10 drip.  Planned Discharge Destination: Home with Home Health    Time spent: 28 minutes  Author: Charlie Patterson, MD 03/27/2024 12:08 PM  For on call review [email or web address].

## 2024-03-27 NOTE — ED Notes (Signed)
 Called CCMD for transfer of central monitoring

## 2024-03-28 ENCOUNTER — Other Ambulatory Visit: Payer: Self-pay

## 2024-03-28 ENCOUNTER — Encounter: Payer: Self-pay | Admitting: Internal Medicine

## 2024-03-28 DIAGNOSIS — G9341 Metabolic encephalopathy: Secondary | ICD-10-CM | POA: Diagnosis not present

## 2024-03-28 DIAGNOSIS — N179 Acute kidney failure, unspecified: Secondary | ICD-10-CM | POA: Diagnosis not present

## 2024-03-28 DIAGNOSIS — I5032 Chronic diastolic (congestive) heart failure: Secondary | ICD-10-CM | POA: Diagnosis not present

## 2024-03-28 DIAGNOSIS — E162 Hypoglycemia, unspecified: Secondary | ICD-10-CM | POA: Diagnosis not present

## 2024-03-28 LAB — BASIC METABOLIC PANEL WITH GFR
Anion gap: 5 (ref 5–15)
BUN: 22 mg/dL (ref 8–23)
CO2: 28 mmol/L (ref 22–32)
Calcium: 7.9 mg/dL — ABNORMAL LOW (ref 8.9–10.3)
Chloride: 103 mmol/L (ref 98–111)
Creatinine, Ser: 1.5 mg/dL — ABNORMAL HIGH (ref 0.61–1.24)
GFR, Estimated: 48 mL/min — ABNORMAL LOW (ref >60)
Glucose, Bld: 330 mg/dL — ABNORMAL HIGH (ref 70–99)
Potassium: 4.3 mmol/L (ref 3.5–5.1)
Sodium: 136 mmol/L (ref 135–145)

## 2024-03-28 LAB — GLUCOSE, CAPILLARY
Glucose-Capillary: 175 mg/dL — ABNORMAL HIGH (ref 70–99)
Glucose-Capillary: 152 mg/dL — ABNORMAL HIGH (ref 70–99)
Glucose-Capillary: 157 mg/dL — ABNORMAL HIGH (ref 70–99)
Glucose-Capillary: 145 mg/dL — ABNORMAL HIGH (ref 70–99)

## 2024-03-28 LAB — CBG MONITORING, ED
Glucose-Capillary: 127 mg/dL — ABNORMAL HIGH (ref 70–99)
Glucose-Capillary: 140 mg/dL — ABNORMAL HIGH (ref 70–99)
Glucose-Capillary: 144 mg/dL — ABNORMAL HIGH (ref 70–99)
Glucose-Capillary: 162 mg/dL — ABNORMAL HIGH (ref 70–99)
Glucose-Capillary: 172 mg/dL — ABNORMAL HIGH (ref 70–99)
Glucose-Capillary: 189 mg/dL — ABNORMAL HIGH (ref 70–99)

## 2024-03-28 MED ORDER — DEXTROSE 10 % IV SOLN: INTRAVENOUS | Status: DC

## 2024-03-28 NOTE — Evaluation (Signed)
 Physical Therapy Evaluation Patient Details Name: Aaron Jones MRN: [id number] DOB: 1946-03-22 Today's Date: 03/28/2024  History of Present Illness  Patient is a 78 year old male with AMS, hypoglycemia. PMH: DM, HTN, HLD,  COPD, CAD, CABG, dCHF, memory loss, CKD-3a, pulm HTN, mitral valve regurgitation, thoracic aneurysm, kidney stone, hypertrophic cardiomyopathy, hard of hearing, A-fib on Eliquis , VSD , PSVT  Clinical Impression  Patient is agreeable to PT evaluation. He is disoriented to place, situation, and time initially. He reports he is the caregiver for his spouse at home. He is independent with mobility per his report.  Today the patient was mildly unsteady initially with standing. He walked in the hallway with occasional steadying assistance provided. No significant change noted with vitals with mobility on room air. The patient does not appear to be at his baseline level of independence. Recommend PT follow up to maximize independence and facilitate return to prior level of function.       If plan is discharge home, recommend the following: A little help with walking and/or transfers;Assist for transportation;Help with stairs or ramp for entrance   Can travel by private vehicle        Equipment Recommendations None recommended by PT  Recommendations for Other Services       Functional Status Assessment Patient has had a recent decline in their functional status and demonstrates the ability to make significant improvements in function in a reasonable and predictable amount of time.     Precautions / Restrictions Precautions Precautions: Fall Restrictions Weight Bearing Restrictions Per Provider Order: No      Mobility  Bed Mobility Overal bed mobility: Modified Independent                  Transfers Overall transfer level: Needs assistance Equipment used: 1 person hand held assist Transfers: Sit to/from Stand Sit to Stand: Min assist            General transfer comment: steadying assistance provided with standing from bed and from toilet    Ambulation/Gait Ambulation/Gait assistance: Contact guard assist Gait Distance (Feet): 60 Feet Assistive device: None Gait Pattern/deviations: Step-through pattern, Decreased stride length, Narrow base of support, Drifts right/left Gait velocity: decreased     General Gait Details: occasional unsteadiness more initially with walking that improved with increased gait distance. no significant change noted with vitals with activity  Stairs            Wheelchair Mobility     Tilt Bed    Modified Rankin (Stroke Patients Only)       Balance Overall balance assessment: Needs assistance Sitting-balance support: Feet supported Sitting balance-Leahy Scale: Good     Standing balance support: Single extremity supported, No upper extremity supported Standing balance-Leahy Scale:  (poor progressing to fair) Standing balance comment: increased unsteadiness initially with standing, requiring unilateral UE support. increased independence with standing with time                             Pertinent Vitals/Pain Pain Assessment Pain Assessment: No/denies pain    Home Living Family/patient expects to be discharged to:: Private residence Living Arrangements: Spouse/significant other Available Help at Discharge:  (unsure, spouse may have additional help as patient is the caregiver for his wife usually) Type of Home: House Home Access: Level entry       Home Layout: One level Home Equipment: Wheelchair - manual (DME typically used by spouse)  Prior Function Prior Level of Function : Independent/Modified Independent;Driving             Mobility Comments: he reports independent and cares for wife at home. he drives to pick up food and take spouse to appointments       Extremity/Trunk Assessment   Upper Extremity Assessment Upper Extremity Assessment:  Overall WFL for tasks assessed    Lower Extremity Assessment Lower Extremity Assessment: Generalized weakness       Communication   Communication Communication: No apparent difficulties    Cognition Arousal: Alert Behavior During Therapy: WFL for tasks assessed/performed   PT - Cognitive impairments: No family/caregiver present to determine baseline, Orientation, Memory   Orientation impairments: Place, Time, Situation                   PT - Cognition Comments: patient sleeping on arrival to room. he reports he is at home, thinks today is Monday. He is more oriented with time. Increased time required for command following Following commands: Impaired Following commands impaired: Follows one step commands with increased time     Cueing Cueing Techniques: Verbal cues, Visual cues     General Comments      Exercises     Assessment/Plan    PT Assessment Patient needs continued PT services  PT Problem List Decreased strength;Decreased range of motion;Decreased activity tolerance;Decreased balance;Decreased mobility;Decreased safety awareness;Decreased knowledge of use of DME;Decreased cognition       PT Treatment Interventions DME instruction;Gait training;Stair training;Functional mobility training;Therapeutic activities;Therapeutic exercise;Balance training;Neuromuscular re-education;Cognitive remediation;Patient/family education    PT Goals (Current goals can be found in the Care Plan section)  Acute Rehab PT Goals Patient Stated Goal: to return home PT Goal Formulation: With patient Time For Goal Achievement: 04/11/24 Potential to Achieve Goals: Good    Frequency Min 2X/week     Co-evaluation               AM-PAC PT 6 Clicks Mobility  Outcome Measure Help needed turning from your back to your side while in a flat bed without using bedrails?: None Help needed moving from lying on your back to sitting on the side of a flat bed without using  bedrails?: A Little Help needed moving to and from a bed to a chair (including a wheelchair)?: A Little Help needed standing up from a chair using your arms (e.g., wheelchair or bedside chair)?: A Little Help needed to walk in hospital room?: A Little Help needed climbing 3-5 steps with a railing? : A Little 6 Click Score: 19    End of Session   Activity Tolerance: Patient tolerated treatment well Patient left: in bed;with call bell/phone within reach;with bed alarm set (set-up with breakfast tray, upright in bed) Nurse Communication: Mobility status PT Visit Diagnosis: Muscle weakness (generalized) (M62.81);Unsteadiness on feet (R26.81)    Time: 9092-9074 PT Time Calculation (min) (ACUTE ONLY): 18 min   Charges:   PT Evaluation $PT Eval Moderate Complexity: 1 Mod   PT General Charges $$ ACUTE PT VISIT: 1 Visit         Randine Essex, PT, MPT   Randine LULLA Essex 03/28/2024, 10:10 AM

## 2024-03-28 NOTE — ED Notes (Signed)
 Pt ate 100% of breakfast tray to include juice and coffee. Pt given cup of water at bedside.

## 2024-03-28 NOTE — Progress Notes (Addendum)
 Progress Note   Patient: Aaron Jones FMW:[id number] DOB: 1945/08/30 DOA: 03/26/2024     1 DOS: the patient was seen and examined on 03/28/2024   Brief hospital course: 78 y.o. male with medical history significant of DM, HTN, HLD,  COPD, CAD, CABG, dCHF, memory loss, CKD-3a, pulm HTN, mitral valve regurgitation, thoracic aneurysm, kidney stone, hypertrophic cardiomyopathy, hard of hearing, A-fib on Eliquis  (s/p Maze), VSD (s/p of closure 2014), PSVT, who presents with altered mental status.   Per her sister-in-law at the bedside, patient normally is alert and orientated x 3.  He takes care of his wife at home.  At about 6 PM, patient was found to be confused.  Patient had hypoglycemia causing confusion in the past. Family is not able to check his blood sugar levels since his glucometer was not working.  They think patient may have hypoglycemia, and started giving him bone to food.  Per EMS, his blood glucose was 135. His blood sugar dropped to 54 in ED.    When I saw patient in ED, patient is confused, knows his own name, knows that he is in the hospital, but not oriented to the time which is not normal to patient per his sister-in-law.  Patient does not seem to have chest pain or abdominal pain.  No active nausea, vomiting or diarrhea noted.  No active respiratory distress, cough noted.  Not sure if patient has symptoms of UTI.  He moves all extremities.   Data reviewed independently and ED Course: pt was found to have WBC 5.6, worsening renal function, temperature normal, bilateral pressure 125/62, heart rate 70, RR 18, oxygen saturation 99% on room air.  CT of head negative acute intracranial abnormalities.    10/23.  Patient takes glipizide and also has acute kidney injury found to have hypoglycemia on D10 drip.  Today's creatinine still elevated at 1.6.  Sugars still in the 100s. 10/24.  Sugars still in the 100s.  Patient on D10 drip.  Once we get her sugar above 200 we will stop to drip and  continue to monitor for hypoglycemia.  Assessment and Plan: * Hypoglycemia Secondary to sulfonylurea and acute kidney injury (patient with history of type 2 diabetes mellitus).  Patient on D10 drip.  Sugars still in the 100s.  Will continue to monitor.  Sometimes with acute kidney injury and sulfonylurea hypoglycemia we can get prolonged hypoglycemia for a few days.  Will continue to monitor closely.  Once sugar above 200, can DC D10 drip and then monitor for hypoglycemia after that.  Acute metabolic encephalopathy Secondary to low sugar.  Mental status better today but does have some underlying memory issues.  Acute kidney injury superimposed on CKD AKI on CKD stage IIIa.  Baseline creatinine around 1.3 with a GFR 5.7.  Creatinine peak at 1.68.  Current creatinine 1.50.  Chronic diastolic CHF (congestive heart failure) (HCC) No current signs of heart failure  HTN (hypertension) On Norvasc  and Toprol   History of atrial fibrillation Paroxysmal in nature on amiodarone , Toprol  and Eliquis   Dyslipidemia On atorvastatin   Memory loss Outpatient follow-up  Obesity (BMI 30-39.9) BMI 32.55        Subjective: Patient advised to eat.  Sugars still in the 100s.  Hemoglobin A1c 6.2.  May have to send home on no diabetic medication.  Admitted with hypoglycemia  Physical Exam: Vitals:   03/28/24 0530 03/28/24 0803 03/28/24 1130 03/28/24 1457  BP: (!) 150/75 (!) 143/81 (!) 156/70 130/64  Pulse: 67 68 70  71  Resp: 16 16 20 17   Temp:  (!) 97.4 F (36.3 C) 98 F (36.7 C) 98 F (36.7 C)  TempSrc:  Oral Oral Oral  SpO2: 97% 97% 98% 100%  Weight:      Height:       Physical Exam HENT:     Head: Normocephalic.     Mouth/Throat:     Pharynx: No oropharyngeal exudate.  Eyes:     General: Lids are normal.     Conjunctiva/sclera: Conjunctivae normal.  Cardiovascular:     Rate and Rhythm: Normal rate and regular rhythm.     Heart sounds: S1 normal and S2 normal. Murmur heard.      Systolic murmur is present with a grade of 3/6.  Pulmonary:     Breath sounds: No decreased breath sounds, wheezing, rhonchi or rales.  Abdominal:     Palpations: Abdomen is soft.     Tenderness: There is no abdominal tenderness.  Musculoskeletal:     Right lower leg: Swelling present.     Left lower leg: Swelling present.  Skin:    General: Skin is warm.     Findings: No rash.  Neurological:     Mental Status: He is alert.     Comments: Able to have a better conversation today than yesterday.     Data Reviewed: Hemoglobin A1c 6.2, creatinine 1.5.  Sugar on chemistry 330 but that was pulled out of the IV.  Fingersticks did not correlate.  Electrolytes normal range.  White blood cell count 4.0, hemoglobin 9.8, platelet count 182  Family Communication: Daughter on phone  Disposition: Status is: Inpatient Remains inpatient appropriate because: Still on D10 drip.  Sugars in the 100s.  Once we get a sugar above 200 I will hopefully stop D10 drip and then watch a little longer to make sure he does not drop below again.  With glipizide can get prolonged hypoglycemia.  Planned Discharge Destination: Home with Home Health    Time spent: 28 minutes  Author: Charlie Patterson, MD 03/28/2024 4:42 PM  For on call review [email or web address].

## 2024-03-28 NOTE — Plan of Care (Signed)

## 2024-03-28 NOTE — ED Notes (Signed)
 Pt provided lunch tray.

## 2024-03-28 NOTE — ED Notes (Signed)
 Pt received breakfast tray

## 2024-03-28 NOTE — Progress Notes (Signed)
 Patient admitted to PCU from ED. Upon arrival, patient placed on continuous cardiac telemetry monitoring. Vital signs obtained and documented. Patient oriented to room, call bell system, and unit routines. Safety measures implemented: bed in lowest position, call bell in reach, and side rails up x3. Patient repositioned for comfort and skin protection. Initial head-to-toes assessment completed. Patient resting comfortably in bed, no acute distress noted at this time.

## 2024-03-29 DIAGNOSIS — G9341 Metabolic encephalopathy: Secondary | ICD-10-CM | POA: Diagnosis not present

## 2024-03-29 DIAGNOSIS — N179 Acute kidney failure, unspecified: Secondary | ICD-10-CM | POA: Diagnosis not present

## 2024-03-29 DIAGNOSIS — E162 Hypoglycemia, unspecified: Secondary | ICD-10-CM | POA: Diagnosis not present

## 2024-03-29 DIAGNOSIS — I5032 Chronic diastolic (congestive) heart failure: Secondary | ICD-10-CM | POA: Diagnosis not present

## 2024-03-29 LAB — BASIC METABOLIC PANEL WITH GFR
Anion gap: 14 (ref 5–15)
BUN: 15 mg/dL (ref 8–23)
CO2: 27 mmol/L (ref 22–32)
Calcium: 8.7 mg/dL — ABNORMAL LOW (ref 8.9–10.3)
Chloride: 100 mmol/L (ref 98–111)
Creatinine, Ser: 1.11 mg/dL (ref 0.61–1.24)
GFR, Estimated: >60 mL/min (ref >60)
Glucose, Bld: 161 mg/dL — ABNORMAL HIGH (ref 70–99)
Potassium: 3.5 mmol/L (ref 3.5–5.1)
Sodium: 141 mmol/L (ref 135–145)

## 2024-03-29 LAB — GLUCOSE, CAPILLARY
Glucose-Capillary: 128 mg/dL — ABNORMAL HIGH (ref 70–99)
Glucose-Capillary: 129 mg/dL — ABNORMAL HIGH (ref 70–99)
Glucose-Capillary: 130 mg/dL — ABNORMAL HIGH (ref 70–99)
Glucose-Capillary: 144 mg/dL — ABNORMAL HIGH (ref 70–99)
Glucose-Capillary: 144 mg/dL — ABNORMAL HIGH (ref 70–99)
Glucose-Capillary: 146 mg/dL — ABNORMAL HIGH (ref 70–99)
Glucose-Capillary: 148 mg/dL — ABNORMAL HIGH (ref 70–99)
Glucose-Capillary: 194 mg/dL — ABNORMAL HIGH (ref 70–99)

## 2024-03-29 MED ORDER — FUROSEMIDE 40 MG PO TABS: 20.0000 mg | ORAL_TABLET | ORAL | Status: AC

## 2024-03-29 NOTE — Plan of Care (Signed)

## 2024-03-29 NOTE — TOC Transition Note (Addendum)
 Transition of Care [facility]) - Discharge Note   Patient Details  Name: Aaron Jones MRN: [id number] Date of Birth: [birth year]  Transition of Care [facility]) CM/SW Contact:  Victory Jackquline GORMAN, RN Phone Number: 03/29/2024, 3:37 PM   Clinical Narrative:   RNCM, spoke with the patient at the bedside. I introduced myself, my role, and explained that discharge planning recommendations would be discussed. PT recommended Home with Home Health/PT. Patient is in agreement with HH/PT. He states that he is the main care taker for his wife who is in a wheelchair and he needs to get stronger to care for her. He has 2 people that he is paying to take care of her while he is in the hospital. His sister-in- law Katheryn is picking him up. Referrals sent for HH/P and [facility] accepted referral. Patient has discharge orders for today. No further concerns. RNCM Signing off.    Final next level of care: Home w Home Health Services Barriers to Discharge: Barriers Resolved   Patient Goals and CMS Choice     Choice offered to / list presented to : Spouse      Discharge Placement                Patient to be transferred to facility by: Katheryn Name of family member notified: Katheryn Patient and family notified of of transfer: 03/29/24  Discharge Plan and Services Additional resources added to the After Visit Summary for                                       Social Drivers of Health (SDOH) Interventions SDOH Screenings   Food Insecurity: No Food Insecurity (03/28/2024)  Housing: Low Risk  (03/28/2024)  Transportation Needs: No Transportation Needs (03/28/2024)  Utilities: Not At Risk (03/28/2024)  Social Connections: Moderately Integrated (03/28/2024)  Tobacco Use: Low Risk  (02/29/2024)     Readmission Risk Interventions     No data to display

## 2024-03-29 NOTE — Progress Notes (Signed)
 Mobility Specialist Progress Note:    03/29/24 1449  Mobility  Activity Ambulated with assistance  Level of Assistance Contact guard assist, steadying assist  Assistive Device None  Distance Ambulated (ft) 160 ft  Range of Motion/Exercises Active;All extremities  Activity Response Tolerated well  Mobility visit 1 Mobility  Mobility Specialist Start Time (ACUTE ONLY) 1430  Mobility Specialist Stop Time (ACUTE ONLY) 1449  Mobility Specialist Time Calculation (min) (ACUTE ONLY) 19 min   Pt received in bed, agreeable to mobility. Pt is oriented to self only during today's session. Required CGA to stand and ambulate with no AD. Tolerated well, asx throughout. Returned supine, alarm on and belongings in reach. RN notified, all needs met.  Sherrilee Ditty Mobility Specialist Please contact via Special Educational Needs Teacher or  Rehab office at [phone]

## 2024-03-29 NOTE — Discharge Instructions (Signed)
 Stop glipizide  Check sugar twice a day and anytime you feel funny.  If sugars go and remain above 200 can restart glucophage 

## 2024-03-29 NOTE — Discharge Summary (Signed)
 Physician Discharge Summary   Patient: Aaron Jones MRN: [id number] DOB: 1946-01-14  Admit date:     03/26/2024  Discharge date: 03/29/24  Discharge Physician: Charlie Patterson   PCP: Rudolpho Norleen BIRCH, MD   Recommendations at discharge:   Follow-up PCP 5 days  Discharge Diagnoses: Principal Problem:   Hypoglycemia Active Problems:   Acute metabolic encephalopathy   CAD (coronary artery disease)   Acute kidney injury superimposed on CKD   Chronic diastolic CHF (congestive heart failure) (HCC)   History of atrial fibrillation   HTN (hypertension)   Dyslipidemia   Memory loss   Obesity (BMI 30-39.9)    Hospital Course: 78 y.o. male with medical history significant of DM, HTN, HLD,  COPD, CAD, CABG, dCHF, memory loss, CKD-3a, pulm HTN, mitral valve regurgitation, thoracic aneurysm, kidney stone, hypertrophic cardiomyopathy, hard of hearing, A-fib on Eliquis  (s/p Maze), VSD (s/p of closure 2014), PSVT, who presents with altered mental status.   Per her sister-in-law at the bedside, patient normally is alert and orientated x 3.  He takes care of his wife at home.  At about 6 PM, patient was found to be confused.  Patient had hypoglycemia causing confusion in the past. Family is not able to check his blood sugar levels since his glucometer was not working.  They think patient may have hypoglycemia, and started giving him bone to food.  Per EMS, his blood glucose was 135. His blood sugar dropped to 54 in ED.    When I saw patient in ED, patient is confused, knows his own name, knows that he is in the hospital, but not oriented to the time which is not normal to patient per his sister-in-law.  Patient does not seem to have chest pain or abdominal pain.  No active nausea, vomiting or diarrhea noted.  No active respiratory distress, cough noted.  Not sure if patient has symptoms of UTI.  He moves all extremities.   Data reviewed independently and ED Course: pt was found to have WBC 5.6,  worsening renal function, temperature normal, bilateral pressure 125/62, heart rate 70, RR 18, oxygen saturation 99% on room air.  CT of head negative acute intracranial abnormalities.    10/23.  Patient takes glipizide and also has acute kidney injury found to have hypoglycemia on D10 drip.  Today's creatinine still elevated at 1.6.  Sugars still in the 100s. 10/24.  Sugars still in the 100s.  Patient on D10 drip.  Once we get her sugar above 200 we will stop to drip and continue to monitor for hypoglycemia. 10/25.  Discontinue D10 drip this a.m.  Sugar 129 at 1207 and 194 at 1451.  Stable for discharge home.  Do not restart glipizide.  Hold Glucophage  for now.  Monitor sugars at home if sugars go above 200 and stay above 200 can restart Glucophage .  Assessment and Plan: * Hypoglycemia Secondary to sulfonylurea and acute kidney injury (patient with history of type 2 diabetes mellitus).  Patient on D10 drip from admission to 10/25.  Upon stopping the 10 drip sugar was 129 and then 194.  Continue to hold glipizide as outpatient.  If sugars go above 200 as outpatient and consistently above then can restart Glucophage .  Acute metabolic encephalopathy Secondary to low sugar.  Mental status improved from admission but does have some underlying memory issues.  Acute kidney injury superimposed on CKD AKI on CKD stage IIIa.  Baseline creatinine around 1.3 with a GFR 5.7.  Creatinine peak at 1.68.  Last creatinine 1.11  Chronic diastolic CHF (congestive heart failure) (HCC) No current signs of heart failure  HTN (hypertension) On Norvasc  and Toprol   History of atrial fibrillation Paroxysmal in nature on amiodarone , Toprol  and Eliquis   Dyslipidemia On atorvastatin   Memory loss Outpatient follow-up  Obesity (BMI 30-39.9) BMI 29.45         Consultants: None Procedures performed: None Disposition: Home health Diet recommendation:  Cardiac and Carb modified diet DISCHARGE  MEDICATION: Allergies as of 03/29/2024       Reactions   Biaxin [clarithromycin] Nausea Only        Medication List     STOP taking these medications    glipiZIDE 5 MG tablet Commonly known as: GLUCOTROL   HYDROcodone -acetaminophen  5-325 MG tablet Commonly known as: NORCO/VICODIN   losartan  100 MG tablet Commonly known as: COZAAR    metFORMIN  1000 MG tablet Commonly known as: GLUCOPHAGE        TAKE these medications    amiodarone  200 MG tablet Commonly known as: PACERONE  Take 1 tablet (200 mg total) by mouth daily.   amLODipine  2.5 MG tablet Commonly known as: NORVASC  Take by mouth daily.   apixaban  5 MG Tabs tablet Commonly known as: Eliquis  Take 1 tablet (5 mg total) by mouth 2 (two) times daily.   atorvastatin  20 MG tablet Commonly known as: LIPITOR  Take 1 tablet (20 mg total) by mouth at bedtime.   BLOOD GLUCOSE TEST STRIPS Strp 1 strip by In Vitro route 2 (two) times daily.   furosemide  40 MG tablet Commonly known as: LASIX  Take 0.5 tablets (20 mg total) by mouth 3 (three) times a week. Start taking on: March 31, 2024 What changed: when to take this   isosorbide  mononitrate 30 MG 24 hr tablet Commonly known as: IMDUR  Take 0.5 tablets (15 mg total) by mouth daily.   memantine 5 MG tablet Commonly known as: NAMENDA Take 5 mg by mouth 2 (two) times daily.   metoprolol  succinate 100 MG 24 hr tablet Commonly known as: TOPROL -XL Take 1 tablet (100 mg total) by mouth daily. Take with or immediately following a meal.   omeprazole 40 MG capsule Commonly known as: PRILOSEC Take 40 mg by mouth daily before breakfast.   sertraline 50 MG tablet Commonly known as: ZOLOFT Take 50 mg by mouth daily.        Follow-up Information     Rudolpho Norleen BIRCH, MD Follow up in 5 day(s).   Specialty: Internal Medicine Contact information: [address] [facility] [place] [address] [phone]                Discharge  Exam: Filed Weights   03/26/24 2123 03/29/24 0500  Weight: 108.9 kg 98.5 kg   Physical Exam HENT:     Head: Normocephalic.     Mouth/Throat:     Pharynx: No oropharyngeal exudate.  Eyes:     General: Lids are normal.     Conjunctiva/sclera: Conjunctivae normal.  Cardiovascular:     Rate and Rhythm: Normal rate and regular rhythm.     Heart sounds: S1 normal and S2 normal. Murmur heard.     Systolic murmur is present with a grade of 3/6.  Pulmonary:     Breath sounds: No decreased breath sounds, wheezing, rhonchi or rales.  Abdominal:     Palpations: Abdomen is soft.     Tenderness: There is no abdominal tenderness.  Musculoskeletal:     Right lower leg: Swelling present.     Left  lower leg: Swelling present.  Skin:    General: Skin is warm.     Findings: No rash.  Neurological:     Mental Status: He is alert.     Comments: Able to have a better conversation today than yesterday.      Condition at discharge: stable  The results of significant diagnostics from this hospitalization (including imaging, microbiology, ancillary and laboratory) are listed below for reference.   Imaging Studies: CT Head Wo Contrast Result Date: 03/26/2024 CLINICAL DATA:  Altered mental status and low blood glucose, initial encounter EXAM: CT HEAD WITHOUT CONTRAST TECHNIQUE: Contiguous axial images were obtained from the base of the skull through the vertex without intravenous contrast. RADIATION DOSE REDUCTION: This exam was performed according to the departmental dose-optimization program which includes automated exposure control, adjustment of the mA and/or kV according to patient size and/or use of iterative reconstruction technique. COMPARISON:  01/14/2024 MRI, CT from 01/10/2021 FINDINGS: Brain: No evidence of acute infarction, hemorrhage, hydrocephalus, extra-axial collection or mass lesion/mass effect. Mild atrophic changes are noted. Mild chronic white matter ischemic changes are noted as  well. Vascular: No hyperdense vessel or unexpected calcification. Skull: Normal. Negative for fracture or focal lesion. Sinuses/Orbits: No acute finding. Other: None. IMPRESSION: Chronic atrophic and ischemic changes without acute abnormality. Electronically Signed   By: Oneil Devonshire M.D.   On: 03/26/2024 22:05     Labs: CBC: Recent Labs  Lab 03/26/24 2138 03/27/24 0314  WBC 5.7 4.0  NEUTROABS 3.4  --   HGB 10.6* 9.8*  HCT 33.4* 30.3*  MCV 96.0 94.4  PLT 179 182   Basic Metabolic Panel: Recent Labs  Lab 03/26/24 2138 03/27/24 0314 03/28/24 0427 03/29/24 0837  NA 139 139 136 141  K 4.7 3.8 4.3 3.5  CL 107 106 103 100  CO2 23 25 28 27   GLUCOSE 75 102* 330* 161*  BUN 26* 27* 22 15  CREATININE 1.68* 1.61* 1.50* 1.11  CALCIUM  8.5* 8.2* 7.9* 8.7*  MG  --  1.9  --   --    Liver Function Tests: Recent Labs  Lab 03/26/24 2138  AST 18  ALT 9  ALKPHOS 54  BILITOT 0.6  PROT 6.5  ALBUMIN  3.3*   CBG: Recent Labs  Lab 03/29/24 0414 03/29/24 0627 03/29/24 1011 03/29/24 1207 03/29/24 1451  GLUCAP 128* 148* 146* 129* 194*    Discharge time spent: greater than 30 minutes.  Signed: Charlie Patterson, MD [facility] 03/29/2024

## 2024-04-18 ENCOUNTER — Inpatient Hospital Stay: Attending: Oncology | Admitting: Oncology

## 2024-04-18 ENCOUNTER — Encounter: Payer: Self-pay | Admitting: Oncology

## 2024-04-18 ENCOUNTER — Inpatient Hospital Stay

## 2024-04-18 VITALS — BP 101/64 | HR 58 | Temp 96.9°F | Resp 18 | Ht 72.0 in | Wt 210.0 lb

## 2024-04-18 DIAGNOSIS — D649 Anemia, unspecified: Secondary | ICD-10-CM

## 2024-04-18 LAB — CBC WITH DIFFERENTIAL/PLATELET
Abs Immature Granulocytes: 0.05 K/uL (ref 0.00–0.07)
Basophils Absolute: 0 K/uL (ref 0.0–0.1)
Basophils Relative: 1 %
Eosinophils Absolute: 0.2 K/uL (ref 0.0–0.5)
Eosinophils Relative: 3 %
HCT: 34.4 % — ABNORMAL LOW (ref 39.0–52.0)
Hemoglobin: 11.1 g/dL — ABNORMAL LOW (ref 13.0–17.0)
Immature Granulocytes: 1 %
Lymphocytes Relative: 21 %
Lymphs Abs: 1.2 K/uL (ref 0.7–4.0)
MCH: 30.2 pg (ref 26.0–34.0)
MCHC: 32.3 g/dL (ref 30.0–36.0)
MCV: 93.7 fL (ref 80.0–100.0)
Monocytes Absolute: 0.5 K/uL (ref 0.1–1.0)
Monocytes Relative: 9 %
Neutro Abs: 3.8 K/uL (ref 1.7–7.7)
Neutrophils Relative %: 65 %
Platelets: 279 K/uL (ref 150–400)
RBC: 3.67 MIL/uL — ABNORMAL LOW (ref 4.22–5.81)
RDW: 13.7 % (ref 11.5–15.5)
WBC: 5.7 K/uL (ref 4.0–10.5)
nRBC: 0 % (ref 0.0–0.2)

## 2024-04-18 LAB — COMPREHENSIVE METABOLIC PANEL WITH GFR
ALT: 13 U/L (ref 0–44)
AST: 18 U/L (ref 15–41)
Albumin: 3.8 g/dL (ref 3.5–5.0)
Alkaline Phosphatase: 88 U/L (ref 38–126)
Anion gap: 9 (ref 5–15)
BUN: 20 mg/dL (ref 8–23)
CO2: 27 mmol/L (ref 22–32)
Calcium: 8.8 mg/dL — ABNORMAL LOW (ref 8.9–10.3)
Chloride: 101 mmol/L (ref 98–111)
Creatinine, Ser: 1.22 mg/dL (ref 0.61–1.24)
GFR, Estimated: >60 mL/min (ref >60)
Glucose, Bld: 129 mg/dL — ABNORMAL HIGH (ref 70–99)
Potassium: 4.3 mmol/L (ref 3.5–5.1)
Sodium: 137 mmol/L (ref 135–145)
Total Bilirubin: 0.3 mg/dL (ref 0.0–1.2)
Total Protein: 7.5 g/dL (ref 6.5–8.1)

## 2024-04-18 LAB — RETICULOCYTES
Immature Retic Fract: 11.6 % (ref 2.3–15.9)
RBC.: 3.7 MIL/uL — ABNORMAL LOW (ref 4.22–5.81)
Retic Count, Absolute: 51.1 K/uL (ref 19.0–186.0)
Retic Ct Pct: 1.4 % (ref 0.4–3.1)

## 2024-04-18 LAB — IRON AND TIBC
Iron: 50 ug/dL (ref 45–182)
Saturation Ratios: 13 % — ABNORMAL LOW (ref 17.9–39.5)
TIBC: 393 ug/dL (ref 250–450)
UIBC: 343 ug/dL

## 2024-04-18 LAB — VITAMIN B12: Vitamin B-12: 467 pg/mL (ref 180–914)

## 2024-04-18 LAB — TSH: TSH: 1.63 u[IU]/mL (ref 0.350–4.500)

## 2024-04-18 LAB — FOLATE: Folate: 6.1 ng/mL (ref >5.9)

## 2024-04-18 LAB — FERRITIN: Ferritin: 29 ng/mL (ref 24–336)

## 2024-04-18 NOTE — Progress Notes (Signed)
 New patient; Referral from Dr. Rudolpho: - Iron deficiency anemia due to dietary causes.

## 2024-04-18 NOTE — Progress Notes (Signed)
 Patient is feeling very fatigued, and has some tingling in fingers and toes. Not a good appetite.

## 2024-04-19 NOTE — Progress Notes (Signed)
 Hematology/Oncology Consult note [facility] Telephone:([phone] [phone]  Patient Care Team: Rudolpho Norleen BIRCH, MD as PCP - General (Internal Medicine) Waddell Danelle ORN, MD as PCP - Electrophysiology (Cardiology) End, Lonni, MD as PCP - Cardiology (Cardiology) Pietro Redell RAMAN, MD as Attending Physician (Cardiology)   Name of the patient: Aaron Jones  [id number]  [birth year]    Reason for referral- anemia   Referring physician- Dr. Rudolpho  Date of visit: 04/19/24   History of presenting illness- Discussed the use of AI scribe software for clinical note transcription with the patient, who gave verbal consent to proceed.  Aaron Jones is a 78 year old male who presents with anemia. He was referred by Dr. Vicci for evaluation of his anemia.  He has experienced fluctuating hemoglobin levels since 2018, with the most recent level being 10.1 g/dL. His creatinine level is 1.5 mg/dL. He recently started receiving B12 injections once a month due to previously low B12 levels in the 280s, identified in July 2025. He is not currently taking any iron supplements, and his iron levels have not been checked recently.  He feels sluggish and reports significant tiredness, and notes that caring for his bed-bound wife limits his physical activity. He experiences significant tiredness and sleeps a lot. His sister mentions that he does not consistently take his medications as prescribed.  In late October, he was hospitalized due to severe hypoglycemia, with blood sugar dropping to 54 mg/dL. He was lethargic and disoriented, requiring a three-day hospital stay to stabilize his blood sugar levels. A similar episode occurred in January when his blood sugar dropped to 35 mg/dL, but he did not require hospitalization at that time. His sister notes that he does not eat much, which may contribute to these episodes.       History of Present Illness  ECOG PS-  1  Pain scale- 3   Review of systems- Review of Systems  Constitutional:  Positive for malaise/fatigue. Negative for chills, fever and weight loss.  HENT:  Negative for congestion, ear discharge and nosebleeds.   Eyes:  Negative for blurred vision.  Respiratory:  Negative for cough, hemoptysis, sputum production, shortness of breath and wheezing.   Cardiovascular:  Negative for chest pain, palpitations, orthopnea and claudication.  Gastrointestinal:  Negative for abdominal pain, blood in stool, constipation, diarrhea, heartburn, melena, nausea and vomiting.  Genitourinary:  Negative for dysuria, flank pain, frequency, hematuria and urgency.  Musculoskeletal:  Negative for back pain, joint pain and myalgias.  Skin:  Negative for rash.  Neurological:  Negative for dizziness, tingling, focal weakness, seizures, weakness and headaches.  Endo/Heme/Allergies:  Does not bruise/bleed easily.  Psychiatric/Behavioral:  Negative for depression and suicidal ideas. The patient does not have insomnia.     Allergies  Allergen Reactions   Biaxin [Clarithromycin] Nausea Only    Patient Active Problem List   Diagnosis Date Noted   Acute kidney injury superimposed on CKD 03/27/2024   Hypoglycemia 03/26/2024   Acute metabolic encephalopathy 03/26/2024   HTN (hypertension) 03/26/2024   CAD (coronary artery disease) 03/26/2024   Chronic diastolic CHF (congestive heart failure) (HCC) 03/26/2024   Memory loss 03/26/2024   Obesity (BMI 30-39.9) 03/26/2024   Cardiomyopathy (HCC) 07/29/2021   Pre-procedure lab exam 07/29/2021   Persistent atrial fibrillation (HCC) 07/29/2021   Aortic dilatation 10/11/2016   Coronary artery disease of native artery of native heart with stable angina pectoris 11/19/2015   History of atrial fibrillation 11/19/2015   Controlled type  2 diabetes mellitus without complication, without long-term current use of insulin  (HCC) 08/13/2015   Atrial flutter (HCC) 03/15/2015    Sinus tachycardia 12/23/2014   Sleep apnea 11/12/2013   Paroxysmal supraventricular tachycardia 01/10/2013   Acute diastolic heart failure (HCC) 11/23/2012   Hypokalemia 11/18/2012   Edema of left lower extremity 11/18/2012   Mild difuse Hypokinesis 11/18/2012   VSD (ventricular septal defect) 11/18/2012   Mild Pulmonary HTN 11/18/2012   Atrial fibrillation with rapid ventricular response (HCC) 11/17/2012   Benign essential hypertension 11/17/2012   Type II or unspecified type diabetes mellitus without mention of complication, not stated as uncontrolled 11/17/2012   Dyslipidemia 11/17/2012   Heart murmur 11/17/2012     Past Medical History:  Diagnosis Date   Atrial fibrillation with rapid ventricular response (HCC) 01/16/2013   a. 01/2013 s/p R & L Maze and LAA clipping in setting of VSD repair;  b. Prev on amiodarone ;  c. CHA2DS2VASc = 4-->Apixaban ;  c. 01/2013 Recurrent afib/flutter requiring DCCV.   CAD (coronary artery disease)    CHF (congestive heart failure) (HCC)    Cognitive and behavioral changes    Colon polyp    COPD (chronic obstructive pulmonary disease) (HCC)    Coronary artery disease    a. 01/2013 s/p CABG x 1 (VG->PDA) in setting of VSD repair.   Edentulous    GERD (gastroesophageal reflux disease)    Hearing loss    Hx of CABG    Hyperlipidemia    Hypertension    PCP- [facility], phone; [phone]   Hypertrophic cardiomyopathy (HCC)    Left to right cardiovascular shunt    Loss of memory    Mitral valve disorder    Moderate aortic stenosis by prior echocardiogram    Moderate pulmonary hypertension (HCC)    Moderate to severe mitral regurgitation    Obesity    Paroxysmal atrial fibrillation (HCC)    PAT (paroxysmal atrial tachycardia)    a. 02/2015 s/p DCCV;  b. 12/2016 recurrent PAT - unchanged with escalating BB dose (rates 106-107).   PSVT (paroxysmal supraventricular tachycardia)    a. 10/2011 Admitted to [facility] w/ SVT-->broke with IV dilt.    Recurrent Nephrolithiasis    S/P Maze operation for atrial fibrillation    S/P VSD closure    Schatzki's ring    Sleep apnea    [facility]- in process of being evaluated now, states 12 yrs. ago was on CPAP but after having his deviated septum repaired, he had put the CPAP in storage. Pt. will get a new machine soon.    Thoracic aortic aneurysm    a. 01/2016 CTA chest: 4.0 cm Asc Ao; b. 01/2017 Echo: nl Ao root size.   Type II diabetes mellitus (HCC)    Umbilical hernia    not repaired (01/16/2013)   Unsteady gait    VSD (ventricular septal defect) w/ persistent systolic murmur    a. 01/2013 s/p VSD closure (dacron patch);  b. 01/2017 Echo: EF 50-55%, no rwma, mild AS, mild MR, mod dil LA, dilated RV, sev dil RA, mild TR. PASP nl.     Past Surgical History:  Procedure Laterality Date   CARDIAC CATHETERIZATION  > 5 yr ago   Done at [facility], reportedly clean   CARDIAC CATHETERIZATION  11/03/2012   CARDIOVERSION N/A 02/12/2015   Procedure: CARDIOVERSION;  Surgeon: Aleene JINNY Passe, MD;  Location: [facility] ENDOSCOPY;  Service: Cardiovascular;  Laterality: N/A;   CARDIOVERSION N/A 04/27/2017   Procedure: CARDIOVERSION;  Surgeon: Waddell Danelle ORN, MD;  Location: [facility] INVASIVE CV LAB;  Service: Cardiovascular;  Laterality: N/A;   CATARACT EXTRACTION W/PHACO Left 02/12/2024   Procedure: PHACOEMULSIFICATION, CATARACT, WITH IOL INSERTION 25.50 01:56.1;  Surgeon: Jaye Fallow, MD;  Location: [facility] SURGERY CNTR;  Service: Ophthalmology;  Laterality: Left;   CATARACT EXTRACTION W/PHACO Right 02/26/2024   Procedure: PHACOEMULSIFICATION, CATARACT, WITH IOL INSERTION 8.41 00:56.6;  Surgeon: Jaye Fallow, MD;  Location: [facility] SURGERY CNTR;  Service: Ophthalmology;  Laterality: Right;   CLIPPING OF ATRIAL APPENDAGE N/A 01/07/2013   Procedure: CLIPPING OF ATRIAL APPENDAGE;  Surgeon: Dallas KATHEE Jude, MD;  Location: [facility] OR;  Service: Open Heart Surgery;  Laterality: N/A;   COLONOSCOPY W/ POLYPECTOMY      COLONOSCOPY WITH PROPOFOL  N/A 01/05/2021   Procedure: COLONOSCOPY WITH PROPOFOL ;  Surgeon: Toledo, Ladell POUR, MD;  Location: [facility] ENDOSCOPY;  Service: Gastroenterology;  Laterality: N/A;   CORONARY ARTERY BYPASS GRAFT N/A 01/07/2013   Procedure: CORONARY ARTERY BYPASS GRAFTING (CABG);  Surgeon: Dallas KATHEE Jude, MD;  Location: [facility] OR;  Service: Open Heart Surgery;  Laterality: N/A;  Times1 using endoscopically harvested saphenous vein graft to the PDA   ESOPHAGOGASTRODUODENOSCOPY N/A 01/05/2021   Procedure: ESOPHAGOGASTRODUODENOSCOPY (EGD);  Surgeon: Toledo, Ladell POUR, MD;  Location: [facility] ENDOSCOPY;  Service: Gastroenterology;  Laterality: N/A;  DM   EYE SURGERY Left 02/03/1989   clipped muscle so it wouldn't be pulling up (01/16/2013)   INTRAOPERATIVE TRANSESOPHAGEAL ECHOCARDIOGRAM N/A 01/07/2013   Procedure: INTRAOPERATIVE TRANSESOPHAGEAL ECHOCARDIOGRAM;  Surgeon: Dallas KATHEE Jude, MD;  Location: [facility] OR;  Service: Open Heart Surgery;  Laterality: N/A;   LEFT AND RIGHT HEART CATHETERIZATION WITH CORONARY ANGIOGRAM N/A 11/20/2012   Procedure: LEFT AND RIGHT HEART CATHETERIZATION WITH CORONARY ANGIOGRAM;  Surgeon: Toribio JONELLE Fuel, MD;  Location: [facility] CATH LAB;  Service: Cardiovascular;  Laterality: N/A;   MAZE N/A 01/07/2013   Procedure: MAZE;  Surgeon: Dallas KATHEE Jude, MD;  Location: [facility] OR;  Service: Open Heart Surgery;  Laterality: N/A;   MULTIPLE EXTRACTIONS WITH ALVEOLOPLASTY N/A 12/11/2012   Procedure: Extraction of tooth #'s 2,3,4,5,14,15,17,21,22,23,24,25,26,27,32 wioth alveoloplasty and bialteral fibrous tuberosity reductions.;  Surgeon: Tanda JULIANNA Fanny, DDS;  Location: [facility] ORS;  Service: Oral Surgery;  Laterality: N/A;   NASAL SEPTUM SURGERY  06/05/2002   RIGHT/LEFT HEART CATH AND CORONARY ANGIOGRAPHY N/A 08/02/2021   Procedure: RIGHT/LEFT HEART CATH AND CORONARY ANGIOGRAPHY;  Surgeon: Mady Bruckner, MD;  Location: [facility] INVASIVE CV LAB;  Service: Cardiovascular;  Laterality: N/A;    sepfal deveation repair     TEE WITHOUT CARDIOVERSION N/A 11/21/2012   Procedure: TRANSESOPHAGEAL ECHOCARDIOGRAM (TEE);  Surgeon: Toribio JONELLE Fuel, MD;  Location: [facility] ENDOSCOPY;  Service: Cardiovascular;  Laterality: N/A;   VSD REPAIR N/A 01/07/2013   Procedure: VENTRICULAR SEPTAL DEFECT (VSD) REPAIR;  Surgeon: Dallas KATHEE Jude, MD;  Location: [facility] OR;  Service: Open Heart Surgery;  Laterality: N/A;   VSD REPAIR      Social History   Socioeconomic History   Marital status: Married    Spouse name: Not on file   Number of children: Not on file   Years of education: Not on file   Highest education level: Not on file  Occupational History   Occupation: Manufacturing    Comment: Heavy work at times  Tobacco Use   Smoking status: Never    Passive exposure: Never   Smokeless tobacco: Never  Vaping Use   Vaping status: Never Used  Substance and Sexual Activity   Alcohol  use: No  Drug use: No   Sexual activity: Not Currently  Other Topics Concern   Not on file  Social History Narrative   Still works full time. Works around the yard. Lives with wife. Has 2 sisters, neither with cardiac issues.   Social Drivers of Corporate Investment Banker Strain: Not on file  Food Insecurity: No Food Insecurity (03/28/2024)   Hunger Vital Sign    Worried About Running Out of Food in the Last Year: Never true    Ran Out of Food in the Last Year: Never true  Transportation Needs: No Transportation Needs (03/28/2024)   PRAPARE - Administrator, Civil Service (Medical): No    Lack of Transportation (Non-Medical): No  Physical Activity: Not on file  Stress: Not on file  Social Connections: Moderately Integrated (03/28/2024)   Social Connection and Isolation Panel    Frequency of Communication with Friends and Family: Once a week    Frequency of Social Gatherings with Friends and Family: Once a week    Attends Religious Services: More than 4 times per year    Active Member of [employer]  or Organizations: Yes    Attends Engineer, Structural: More than 4 times per year    Marital Status: Married  Catering Manager Violence: Not At Risk (03/28/2024)   Humiliation, Afraid, Rape, and Kick questionnaire    Fear of Current or Ex-Partner: No    Emotionally Abused: No    Physically Abused: No    Sexually Abused: No     Family History  Problem Relation Age of Onset   Cancer Mother    Diabetes Mother      Current Outpatient Medications:    amiodarone  (PACERONE ) 200 MG tablet, Take 1 tablet (200 mg total) by mouth daily., Disp: 90 tablet, Rfl: 0   amLODipine  (NORVASC ) 2.5 MG tablet, Take by mouth daily., Disp: , Rfl:    apixaban  (ELIQUIS ) 5 MG TABS tablet, Take 1 tablet (5 mg total) by mouth 2 (two) times daily., Disp: 180 tablet, Rfl: 3   atorvastatin  (LIPITOR ) 20 MG tablet, Take 1 tablet (20 mg total) by mouth at bedtime., Disp: 90 tablet, Rfl: 2   isosorbide  mononitrate (IMDUR ) 30 MG 24 hr tablet, Take 0.5 tablets (15 mg total) by mouth daily., Disp: 30 tablet, Rfl: 5   losartan  (COZAAR ) 100 MG tablet, Take 100 mg by mouth daily., Disp: , Rfl:    memantine (NAMENDA) 5 MG tablet, Take 5 mg by mouth 2 (two) times daily., Disp: , Rfl:    metoprolol  succinate (TOPROL -XL) 100 MG 24 hr tablet, Take 1 tablet (100 mg total) by mouth daily. Take with or immediately following a meal., Disp: 90 tablet, Rfl: 3   omeprazole (PRILOSEC) 40 MG capsule, Take 40 mg by mouth daily before breakfast. , Disp: , Rfl:    sertraline (ZOLOFT) 50 MG tablet, Take 50 mg by mouth daily., Disp: , Rfl:    furosemide  (LASIX ) 40 MG tablet, Take 0.5 tablets (20 mg total) by mouth 3 (three) times a week. (Patient not taking: Reported on 04/18/2024), Disp: , Rfl:    Glucose Blood (BLOOD GLUCOSE TEST STRIPS) STRP, 1 strip by In Vitro route 2 (two) times daily. (Patient not taking: Reported on 04/18/2024), Disp: 100 each, Rfl: 0   HYDROcodone -acetaminophen  (NORCO/VICODIN) 5-325 MG tablet, Take 1 tablet by  mouth as needed. (Patient not taking: Reported on 04/18/2024), Disp: , Rfl:    Physical exam:  Vitals:   04/18/24 1459  BP:  101/64  Pulse: (!) 58  Resp: 18  Temp: (!) 96.9 F (36.1 C)  TempSrc: Tympanic  SpO2: 99%  Weight: 210 lb (95.3 kg)  Height: 6' (1.829 m)   Physical Exam Cardiovascular:     Rate and Rhythm: Normal rate and regular rhythm.     Heart sounds: Normal heart sounds.  Pulmonary:     Effort: Pulmonary effort is normal.     Breath sounds: Normal breath sounds.  Abdominal:     General: Bowel sounds are normal.     Palpations: Abdomen is soft.  Musculoskeletal:     Cervical back: Normal range of motion.  Skin:    General: Skin is warm and dry.  Neurological:     Mental Status: He is alert and oriented to person, place, and time.           Latest Ref Rng & Units 04/18/2024    3:40 PM  CMP  Glucose 70 - 99 mg/dL 870   BUN 8 - 23 mg/dL 20   Creatinine 9.38 - 1.24 mg/dL 8.77   Sodium 864 - 854 mmol/L 137   Potassium 3.5 - 5.1 mmol/L 4.3   Chloride 98 - 111 mmol/L 101   CO2 22 - 32 mmol/L 27   Calcium  8.9 - 10.3 mg/dL 8.8   Total Protein 6.5 - 8.1 g/dL 7.5   Total Bilirubin 0.0 - 1.2 mg/dL 0.3   Alkaline Phos 38 - 126 U/L 88   AST 15 - 41 U/L 18   ALT 0 - 44 U/L 13       Latest Ref Rng & Units 04/18/2024    3:40 PM  CBC  WBC 4.0 - 10.5 K/uL 5.7   Hemoglobin 13.0 - 17.0 g/dL 88.8   Hematocrit 60.9 - 52.0 % 34.4   Platelets 150 - 400 K/uL 279     No images are attached to the encounter.  CT Head Wo Contrast Result Date: 03/26/2024 CLINICAL DATA:  Altered mental status and low blood glucose, initial encounter EXAM: CT HEAD WITHOUT CONTRAST TECHNIQUE: Contiguous axial images were obtained from the base of the skull through the vertex without intravenous contrast. RADIATION DOSE REDUCTION: This exam was performed according to the departmental dose-optimization program which includes automated exposure control, adjustment of the mA and/or kV  according to patient size and/or use of iterative reconstruction technique. COMPARISON:  01/14/2024 MRI, CT from 01/10/2021 FINDINGS: Brain: No evidence of acute infarction, hemorrhage, hydrocephalus, extra-axial collection or mass lesion/mass effect. Mild atrophic changes are noted. Mild chronic white matter ischemic changes are noted as well. Vascular: No hyperdense vessel or unexpected calcification. Skull: Normal. Negative for fracture or focal lesion. Sinuses/Orbits: No acute finding. Other: None. IMPRESSION: Chronic atrophic and ischemic changes without acute abnormality. Electronically Signed   By: Oneil Devonshire M.D.   On: 03/26/2024 22:05    Assessment and plan- Patient is a 78 y.o. male referred for anemia  Assessment and Plan    Anemia evaluation and management Anemia with hemoglobin at 10.1. Differential includes nutritional deficiencies, chronic kidney disease, and B12 deficiency. Potential anemia of chronic kidney disease due to insufficient EPO production. Synthetic EPO injections considered if hemoglobin drops below 9. - Ordered iron studies including ferritin and iron levels, TSH, myeloma panel, FLC, retic count - Scheduled follow-up appointment after Thanksgiving. - Will consider iron infusions if iron levels are low.discussed risks and benefits of IV iron including all but not limited to possible risk of infusion and anaphylactic reaction. Patient understands  and agrees to proceed as planned.  - Will discuss potential for synthetic EPO injections if hemoglobin drops below 9.  Vitamin B12 deficiency B12 levels previously low at 280s. Recently started on monthly B12 injections. Monitoring required to assess impact on anemia. - Continue monthly B12 injections. - Monitor B12 levels and hemoglobin response.  Chronic kidney disease Creatinine level at 1.5. Potential contribution to anemia due to insufficient EPO production.  Recurrent hypoglycemia (resolved) Recurrent hypoglycemia  with episodes in January and October, likely related to poor dietary intake and medication adherence.         Thank you for this kind referral and the opportunity to participate in the care of this  Patient   Visit Diagnosis 1. Normocytic anemia     Dr. Annah Skene, MD, MPH [facility] [phone] 04/19/2024

## 2024-04-20 LAB — HAPTOGLOBIN: Haptoglobin: 58 mg/dL (ref 34–355)

## 2024-04-21 LAB — MULTIPLE MYELOMA PANEL, SERUM
Albumin SerPl Elph-Mcnc: 3.4 g/dL (ref 2.9–4.4)
Albumin/Glob SerPl: 1.1 (ref 0.7–1.7)
Alpha 1: 0.3 g/dL (ref 0.0–0.4)
Alpha2 Glob SerPl Elph-Mcnc: 0.8 g/dL (ref 0.4–1.0)
B-Globulin SerPl Elph-Mcnc: 1.4 g/dL — ABNORMAL HIGH (ref 0.7–1.3)
Gamma Glob SerPl Elph-Mcnc: 1 g/dL (ref 0.4–1.8)
Globulin, Total: 3.4 g/dL (ref 2.2–3.9)
IgA: 499 mg/dL — ABNORMAL HIGH (ref 61–437)
IgG (Immunoglobin G), Serum: 1114 mg/dL (ref 603–1613)
IgM (Immunoglobulin M), Srm: 46 mg/dL (ref 15–143)
Total Protein ELP: 6.8 g/dL (ref 6.0–8.5)

## 2024-04-21 LAB — KAPPA/LAMBDA LIGHT CHAINS
Kappa free light chain: 49.9 mg/L — ABNORMAL HIGH (ref 3.3–19.4)
Kappa, lambda light chain ratio: 1.33 (ref 0.26–1.65)
Lambda free light chains: 37.5 mg/L — ABNORMAL HIGH (ref 5.7–26.3)

## 2024-04-24 ENCOUNTER — Ambulatory Visit

## 2024-04-24 ENCOUNTER — Ambulatory Visit: Attending: Emergency Medicine

## 2024-04-24 DIAGNOSIS — I350 Nonrheumatic aortic (valve) stenosis: Secondary | ICD-10-CM | POA: Insufficient documentation

## 2024-04-24 DIAGNOSIS — I340 Nonrheumatic mitral (valve) insufficiency: Secondary | ICD-10-CM | POA: Diagnosis not present

## 2024-04-24 LAB — ECHOCARDIOGRAM COMPLETE
AR max vel: 1.26 cm2
AV Area VTI: 1.29 cm2
AV Area mean vel: 1.25 cm2
AV Mean grad: 16 mmHg
AV Peak grad: 28.3 mmHg
Ao pk vel: 2.66 m/s
Area-P 1/2: 3.77 cm2
S' Lateral: 3.47 cm

## 2024-04-29 ENCOUNTER — Ambulatory Visit: Payer: Self-pay | Admitting: Emergency Medicine

## 2024-04-29 NOTE — Progress Notes (Signed)
 Patient has reviewed results via MyChart.   Last read by Helayne GORMAN Rumble at 9:12AM on 04/29/2024.

## 2024-05-13 ENCOUNTER — Encounter: Payer: Self-pay | Admitting: Oncology

## 2024-05-13 ENCOUNTER — Inpatient Hospital Stay: Attending: Oncology | Admitting: Oncology

## 2024-05-13 VITALS — BP 131/69 | HR 86 | Temp 96.4°F | Resp 19 | Ht 72.0 in | Wt 191.5 lb

## 2024-05-13 DIAGNOSIS — D509 Iron deficiency anemia, unspecified: Secondary | ICD-10-CM | POA: Insufficient documentation

## 2024-05-13 DIAGNOSIS — D508 Other iron deficiency anemias: Secondary | ICD-10-CM | POA: Diagnosis not present

## 2024-05-13 DIAGNOSIS — D649 Anemia, unspecified: Secondary | ICD-10-CM

## 2024-05-13 NOTE — Progress Notes (Unsigned)
 Patient states he's feeling okay with no new or acute concerns at this time.

## 2024-05-14 ENCOUNTER — Inpatient Hospital Stay: Admitting: Oncology

## 2024-05-14 MED ORDER — METOPROLOL SUCCINATE ER 100 MG PO TB24: 100.0000 mg | ORAL_TABLET | Freq: Every day | ORAL | 3 refills | Status: AC

## 2024-05-16 ENCOUNTER — Encounter: Payer: Self-pay | Admitting: Oncology

## 2024-05-16 ENCOUNTER — Other Ambulatory Visit: Payer: Self-pay | Admitting: Oncology

## 2024-05-16 DIAGNOSIS — D509 Iron deficiency anemia, unspecified: Secondary | ICD-10-CM | POA: Insufficient documentation

## 2024-05-16 DIAGNOSIS — D508 Other iron deficiency anemias: Secondary | ICD-10-CM

## 2024-05-16 NOTE — Progress Notes (Signed)
 Hematology/Oncology Consult note [facility] ([facility])  [phone] [phone]  Patient Care Team: Rudolpho Norleen BIRCH, MD as PCP - General (Internal Medicine) Waddell Danelle ORN, MD as PCP - Electrophysiology (Cardiology) End, Lonni, MD as PCP - Cardiology (Cardiology) Pietro Redell RAMAN, MD as Attending Physician (Cardiology)   Name of the patient: Aaron Jones  [id number]  01-21-1946   Date of visit: 05/16/2024  Diagnosis-iron deficiency anemia  Chief complaint/ Reason for visit- discuss results of bloodwork  Heme/Onc history: Aaron Jones is a 78 year old male who presents with anemia. He was referred by Dr. Vicci for evaluation of his anemia.   He has experienced fluctuating hemoglobin levels since 2018, with the most recent level being 10.1 g/dL. His creatinine level is 1.5 mg/dL. He recently started receiving B12 injections once a month due to previously low B12 levels in the 280s, identified in July 2025. He is not currently taking any iron supplements, and his iron levels have not been checked recently.   He feels sluggish and reports significant tiredness, and notes that caring for his bed-bound wife limits his physical activity. He experiences significant tiredness and sleeps a lot. His sister mentions that he does not consistently take his medications as prescribed.   In late October, he was hospitalized due to severe hypoglycemia, with blood sugar dropping to 54 mg/dL. He was lethargic and disoriented, requiring a three-day hospital stay to stabilize his blood sugar levels. A similar episode occurred in January when his blood sugar dropped to 35 mg/dL, but he did not require hospitalization at that time. His sister notes that he does not eat much, which may contribute to these episodes.   Results of blood work from 04/18/2024 were as follows: CBC showed white count of 5.7, H&H of 11.1/34.4 with an MCV of 93.7 and a platelet count of  279.  B12 levels were normal at 467 and folate was normal.  Ferritin levels were low at 29 myeloma panel showed no M protein.  Haptoglobin and TSH were normal.  Serum free lambda kappa light chains were both elevated with a normal light chain ratio.  Iron studies showed a low iron saturation of 13%  Interval history- Discussed the use of AI scribe software for clinical note transcription with the patient, who gave verbal consent to proceed.  History of Present Illness   Aaron Jones is a 78 year old male with iron deficiency anemia who presents for follow-up of anemia.  He has persistent iron deficiency anemia with recent laboratory evaluation showing improvement in hemoglobin from 9.8 g/dL in October to 88.8 g/dL two weeks ago. Ferritin remains low at 29 ng/mL, and iron studies confirm ongoing iron deficiency. B12 and thyroid  levels are within normal limits, and kidney function, previously abnormal, has improved. He denies symptoms of acute blood loss, including hematochezia and melena, and currently reports no significant fatigue or malaise.  He has undergone multiple prior colonoscopies and endoscopies, though the dates and findings are unclear. He does not recall the name of his GI provider. He denies recent gastrointestinal symptoms.      ECOG PS- 1 Pain scale- 0   Review of systems- Review of Systems  Constitutional:  Negative for chills, fever, malaise/fatigue and weight loss.  HENT:  Negative for congestion, ear discharge and nosebleeds.   Eyes:  Negative for blurred vision.  Respiratory:  Negative for cough, hemoptysis, sputum production, shortness of breath and wheezing.   Cardiovascular:  Negative for chest pain,  palpitations, orthopnea and claudication.  Gastrointestinal:  Negative for abdominal pain, blood in stool, constipation, diarrhea, heartburn, melena, nausea and vomiting.  Genitourinary:  Negative for dysuria, flank pain, frequency, hematuria and urgency.   Musculoskeletal:  Negative for back pain, joint pain and myalgias.  Skin:  Negative for rash.  Neurological:  Negative for dizziness, tingling, focal weakness, seizures, weakness and headaches.  Endo/Heme/Allergies:  Does not bruise/bleed easily.  Psychiatric/Behavioral:  Negative for depression and suicidal ideas. The patient does not have insomnia.       Allergies[1]   Past Medical History:  Diagnosis Date   Atrial fibrillation with rapid ventricular response (HCC) 01/16/2013   a. 01/2013 s/p R & L Maze and LAA clipping in setting of VSD repair;  b. Prev on amiodarone ;  c. CHA2DS2VASc = 4-->Apixaban ;  c. 01/2013 Recurrent afib/flutter requiring DCCV.   CAD (coronary artery disease)    CHF (congestive heart failure) (HCC)    Cognitive and behavioral changes    Colon polyp    COPD (chronic obstructive pulmonary disease) (HCC)    Coronary artery disease    a. 01/2013 s/p CABG x 1 (VG->PDA) in setting of VSD repair.   Edentulous    GERD (gastroesophageal reflux disease)    Hearing loss    Hx of CABG    Hyperlipidemia    Hypertension    PCP- [facility] Walker - [place], phone; [phone]   Hypertrophic cardiomyopathy (HCC)    Left to right cardiovascular shunt    Loss of memory    Mitral valve disorder    Moderate aortic stenosis by prior echocardiogram    Moderate pulmonary hypertension (HCC)    Moderate to severe mitral regurgitation    Obesity    Paroxysmal atrial fibrillation (HCC)    PAT (paroxysmal atrial tachycardia)    a. 02/2015 s/p DCCV;  b. 12/2016 recurrent PAT - unchanged with escalating BB dose (rates 106-107).   PSVT (paroxysmal supraventricular tachycardia)    a. 10/2011 Admitted to [facility] w/ SVT-->broke with IV dilt.   Recurrent Nephrolithiasis    S/P Maze operation for atrial fibrillation    S/P VSD closure    Schatzki's ring    Sleep apnea    [facility]- in process of being evaluated now, states 12 yrs. ago was on CPAP but after having his deviated septum repaired, he had  put the CPAP in storage. Pt. will get a new machine soon.    Thoracic aortic aneurysm    a. 01/2016 CTA chest: 4.0 cm Asc Ao; b. 01/2017 Echo: nl Ao root size.   Type II diabetes mellitus (HCC)    Umbilical hernia    not repaired (01/16/2013)   Unsteady gait    VSD (ventricular septal defect) w/ persistent systolic murmur    a. 01/2013 s/p VSD closure (dacron patch);  b. 01/2017 Echo: EF 50-55%, no rwma, mild AS, mild MR, mod dil LA, dilated RV, sev dil RA, mild TR. PASP nl.     Past Surgical History:  Procedure Laterality Date   CARDIAC CATHETERIZATION  > 5 yr ago   Done at [facility], reportedly clean   CARDIAC CATHETERIZATION  11/03/2012   CARDIOVERSION N/A 02/12/2015   Procedure: CARDIOVERSION;  Surgeon: Aleene JINNY Passe, MD;  Location: [facility] ENDOSCOPY;  Service: Cardiovascular;  Laterality: N/A;   CARDIOVERSION N/A 04/27/2017   Procedure: CARDIOVERSION;  Surgeon: Waddell Danelle ORN, MD;  Location: [facility] INVASIVE CV LAB;  Service: Cardiovascular;  Laterality: N/A;   CATARACT EXTRACTION W/PHACO Left 02/12/2024   Procedure:  PHACOEMULSIFICATION, CATARACT, WITH IOL INSERTION 25.50 01:56.1;  Surgeon: Jaye Fallow, MD;  Location: [facility];  Service: Ophthalmology;  Laterality: Left;   CATARACT EXTRACTION W/PHACO Right 02/26/2024   Procedure: PHACOEMULSIFICATION, CATARACT, WITH IOL INSERTION 8.41 00:56.6;  Surgeon: Jaye Fallow, MD;  Location: [facility];  Service: Ophthalmology;  Laterality: Right;   CLIPPING OF ATRIAL APPENDAGE N/A 01/07/2013   Procedure: CLIPPING OF ATRIAL APPENDAGE;  Surgeon: Dallas KATHEE Jude, MD;  Location: [facility] OR;  Service: Open Heart Surgery;  Laterality: N/A;   COLONOSCOPY W/ POLYPECTOMY     COLONOSCOPY WITH PROPOFOL  N/A 01/05/2021   Procedure: COLONOSCOPY WITH PROPOFOL ;  Surgeon: Toledo, Ladell POUR, MD;  Location: [facility] ENDOSCOPY;  Service: Gastroenterology;  Laterality: N/A;   CORONARY ARTERY BYPASS GRAFT N/A 01/07/2013   Procedure: CORONARY ARTERY  BYPASS GRAFTING (CABG);  Surgeon: Dallas KATHEE Jude, MD;  Location: [facility] OR;  Service: Open Heart Surgery;  Laterality: N/A;  Times1 using endoscopically harvested saphenous vein graft to the PDA   ESOPHAGOGASTRODUODENOSCOPY N/A 01/05/2021   Procedure: ESOPHAGOGASTRODUODENOSCOPY (EGD);  Surgeon: Toledo, Ladell POUR, MD;  Location: [facility] ENDOSCOPY;  Service: Gastroenterology;  Laterality: N/A;  DM   EYE SURGERY Left 02/03/1989   clipped muscle so it wouldn't be pulling up (01/16/2013)   INTRAOPERATIVE TRANSESOPHAGEAL ECHOCARDIOGRAM N/A 01/07/2013   Procedure: INTRAOPERATIVE TRANSESOPHAGEAL ECHOCARDIOGRAM;  Surgeon: Dallas KATHEE Jude, MD;  Location: [facility] OR;  Service: Open Heart Surgery;  Laterality: N/A;   LEFT AND RIGHT HEART CATHETERIZATION WITH CORONARY ANGIOGRAM N/A 11/20/2012   Procedure: LEFT AND RIGHT HEART CATHETERIZATION WITH CORONARY ANGIOGRAM;  Surgeon: Toribio JONELLE Fuel, MD;  Location: [facility] CATH LAB;  Service: Cardiovascular;  Laterality: N/A;   MAZE N/A 01/07/2013   Procedure: MAZE;  Surgeon: Dallas KATHEE Jude, MD;  Location: [facility] OR;  Service: Open Heart Surgery;  Laterality: N/A;   MULTIPLE EXTRACTIONS WITH ALVEOLOPLASTY N/A 12/11/2012   Procedure: Extraction of tooth #'s 2,3,4,5,14,15,17,21,22,23,24,25,26,27,32 wioth alveoloplasty and bialteral fibrous tuberosity reductions.;  Surgeon: Tanda JULIANNA Fanny, DDS;  Location: [facility] ORS;  Service: Oral Surgery;  Laterality: N/A;   NASAL SEPTUM SURGERY  06/05/2002   RIGHT/LEFT HEART CATH AND CORONARY ANGIOGRAPHY N/A 08/02/2021   Procedure: RIGHT/LEFT HEART CATH AND CORONARY ANGIOGRAPHY;  Surgeon: Mady Bruckner, MD;  Location: [facility] INVASIVE CV LAB;  Service: Cardiovascular;  Laterality: N/A;   sepfal deveation repair     TEE WITHOUT CARDIOVERSION N/A 11/21/2012   Procedure: TRANSESOPHAGEAL ECHOCARDIOGRAM (TEE);  Surgeon: Toribio JONELLE Fuel, MD;  Location: [facility] ENDOSCOPY;  Service: Cardiovascular;  Laterality: N/A;   VSD REPAIR N/A 01/07/2013   Procedure:  VENTRICULAR SEPTAL DEFECT (VSD) REPAIR;  Surgeon: Dallas KATHEE Jude, MD;  Location: [facility] OR;  Service: Open Heart Surgery;  Laterality: N/A;   VSD REPAIR      Social History   Socioeconomic History   Marital status: Married    Spouse name: Not on file   Number of children: Not on file   Years of education: Not on file   Highest education level: Not on file  Occupational History   Occupation: Manufacturing    Comment: Heavy work at times  Tobacco Use   Smoking status: Never    Passive exposure: Never   Smokeless tobacco: Never  Vaping Use   Vaping status: Never Used  Substance and Sexual Activity   Alcohol  use: No   Drug use: No   Sexual activity: Not Currently  Other Topics Concern   Not on file  Social History Narrative   Still works full  time. Works around the yard. Lives with wife. Has 2 sisters, neither with cardiac issues.   Social Drivers of Health   Tobacco Use: Low Risk (05/13/2024)   Patient History    Smoking Tobacco Use: Never    Smokeless Tobacco Use: Never    Passive Exposure: Never  Financial Resource Strain: Not on file  Food Insecurity: No Food Insecurity (03/28/2024)   Epic    Worried About Programme Researcher, Broadcasting/film/video in the Last Year: Never true    Ran Out of Food in the Last Year: Never true  Transportation Needs: No Transportation Needs (03/28/2024)   Epic    Lack of Transportation (Medical): No    Lack of Transportation (Non-Medical): No  Physical Activity: Not on file  Stress: Not on file  Social Connections: Moderately Integrated (03/28/2024)   Social Connection and Isolation Panel    Frequency of Communication with Friends and Family: Once a week    Frequency of Social Gatherings with Friends and Family: Once a week    Attends Religious Services: More than 4 times per year    Active Member of [employer] or Organizations: Yes    Attends Banker Meetings: More than 4 times per year    Marital Status: Married  Catering Manager Violence: Not At  Risk (03/28/2024)   Epic    Fear of Current or Ex-Partner: No    Emotionally Abused: No    Physically Abused: No    Sexually Abused: No  Depression (PHQ2-9): Low Risk (05/13/2024)   Depression (PHQ2-9)    PHQ-2 Score: 0  Alcohol  Screen: Not on file  Housing: Low Risk (03/28/2024)   Epic    Unable to Pay for Housing in the Last Year: No    Number of Times Moved in the Last Year: 0    Homeless in the Last Year: No  Utilities: Not At Risk (03/28/2024)   Epic    Threatened with loss of utilities: No  Health Literacy: Not on file    Family History  Problem Relation Age of Onset   Cancer Mother    Diabetes Mother     Current Medications[2]  Physical exam:  Vitals:   05/13/24 1305  BP: 131/69  Pulse: 86  Resp: 19  Temp: (!) 96.4 F (35.8 C)  TempSrc: Tympanic  SpO2: 98%  Weight: 191 lb 8 oz (86.9 kg)  Height: 6' (1.829 m)   Physical Exam Cardiovascular:     Rate and Rhythm: Normal rate and regular rhythm.     Heart sounds: Normal heart sounds.  Pulmonary:     Effort: Pulmonary effort is normal.     Breath sounds: Normal breath sounds.  Skin:    General: Skin is warm and dry.  Neurological:     Mental Status: He is alert and oriented to person, place, and time.      I have personally reviewed labs listed below:    Latest Ref Rng & Units 04/18/2024    3:40 PM  CMP  Glucose 70 - 99 mg/dL 870   BUN 8 - 23 mg/dL 20   Creatinine 9.38 - 1.24 mg/dL 8.77   Sodium 864 - 854 mmol/L 137   Potassium 3.5 - 5.1 mmol/L 4.3   Chloride 98 - 111 mmol/L 101   CO2 22 - 32 mmol/L 27   Calcium  8.9 - 10.3 mg/dL 8.8   Total Protein 6.5 - 8.1 g/dL 7.5   Total Bilirubin 0.0 - 1.2 mg/dL 0.3   Alkaline  Phos 38 - 126 U/L 88   AST 15 - 41 U/L 18   ALT 0 - 44 U/L 13       Latest Ref Rng & Units 04/18/2024    3:40 PM  CBC  WBC 4.0 - 10.5 K/uL 5.7   Hemoglobin 13.0 - 17.0 g/dL 88.8   Hematocrit 60.9 - 52.0 % 34.4   Platelets 150 - 400 K/uL 279    I have personally reviewed  Radiology images listed below: No images are attached to the encounter.  ECHOCARDIOGRAM COMPLETE Result Date: 04/24/2024    ECHOCARDIOGRAM REPORT   Patient Name:   KAHLEB MCCLANE Thrush Date of Exam: 04/24/2024 Medical Rec #:  [id number]       Height:       72.0 in Accession #:    [id number]      Weight:       210.0 lb Date of Birth:  [birth year]       BSA:          2.175 m Patient Age:    78 years        BP:           101/64 mmHg Patient Gender: M               HR:           68 bpm. Exam Location:  [facility] Procedure: 2D Echo, 3D Echo, Cardiac Doppler, Color Doppler and Strain Analysis            (Both Spectral and Color Flow Doppler were utilized during            procedure). Indications:    I35.0 Nonrheumatic aortic (valve) stenosis  History:        Patient has prior history of Echocardiogram examinations, most                 recent 11/25/2021. CHF and VSD, CAD, Prior Cardiac Surgery and                 Prior CABG, COPD, Aortic Valve Disease, Arrythmias:Atrial                 Fibrillation, Signs/Symptoms:Edema and Murmur; Risk                 Factors:Dyslipidemia, Sleep Apnea, Diabetes, Hypertension and                 Non-Smoker.  Sonographer:    Doyal Point MHA, BS, RDCS Referring Phys: [id number] MIRIAM BRAVO CAMPBELL IMPRESSIONS  1. Left ventricular ejection fraction, by estimation, is 55 to 60%. The left ventricle has normal function. The left ventricle has no regional wall motion abnormalities. There is severe asymmetric left ventricular hypertrophy of the basal-septal segment. Left ventricular diastolic parameters are consistent with Grade III diastolic dysfunction (restrictive). The average left ventricular global longitudinal strain is -13.0 %. The global longitudinal strain is abnormal.  2. Right ventricular systolic function is normal. The right ventricular size is normal. There is mildly elevated pulmonary artery systolic pressure. The estimated right ventricular systolic pressure is 38.3 mmHg.  3. S/p  VSD repair. There is a residual peri-membranous VSD present with left to right shunting, peak gradient .  4. Left atrial size was severely dilated.  5. Right atrial size was moderately dilated.  6. The mitral valve is degenerative. Mild to moderate mitral valve regurgitation.  7. The tricuspid valve is abnormal. Tricuspid valve regurgitation is moderate to severe.  8. The aortic  valve is tricuspid. There is severe calcifcation of the aortic valve. Aortic valve regurgitation is mild. Moderate low-flow, low-gradient aortic valve stenosis. Aortic valve area, by VTI measures 1.29 cm. Aortic valve mean gradient measures 16.0 mmHg. Aortic valve Vmax measures 2.66 m/s. DVI of 0.28 with SVi 35.  9. Aortic dilatation noted. There is borderline dilatation of the aortic root, measuring 38 mm. 10. The inferior vena cava is normal in size with greater than 50% respiratory variability, suggesting right atrial pressure of 3 mmHg. Comparison(s): A prior study was performed on 11/25/2021. VSD gradients measure higher on today's exam, otherwise no significant changes. FINDINGS  Left Ventricle: Peri-membranous VSD present with left to right shunting. Left ventricular ejection fraction, by estimation, is 55 to 60%. The left ventricle has normal function. The left ventricle has no regional wall motion abnormalities. The average left ventricular global longitudinal strain is -13.0 %. Strain was performed and the global longitudinal strain is abnormal. 3D ejection fraction reviewed and evaluated as part of the interpretation. Alternate measurement of EF is felt to be most reflective of LV function. The left ventricular internal cavity size was normal in size. There is severe asymmetric left ventricular hypertrophy of the basal-septal segment. Left ventricular diastolic parameters are consistent with Grade III diastolic dysfunction (restrictive). Right Ventricle: The right ventricular size is normal. Right vetricular wall thickness  was not well visualized. Right ventricular systolic function is normal. There is mildly elevated pulmonary artery systolic pressure. The tricuspid regurgitant velocity  is 2.97 m/s, and with an assumed right atrial pressure of 3 mmHg, the estimated right ventricular systolic pressure is 38.3 mmHg. Left Atrium: Left atrial size was severely dilated. Right Atrium: Right atrial size was moderately dilated. Pericardium: There is no evidence of pericardial effusion. Mitral Valve: The mitral valve is degenerative in appearance. Mild to moderate mitral valve regurgitation. Tricuspid Valve: The tricuspid valve is abnormal. Tricuspid valve regurgitation is moderate to severe. No evidence of tricuspid stenosis. Aortic Valve: The aortic valve is tricuspid. There is severe calcifcation of the aortic valve. Aortic valve regurgitation is mild. Moderate aortic stenosis is present. Aortic valve mean gradient measures 16.0 mmHg. Aortic valve peak gradient measures 28.3 mmHg. Aortic valve area, by VTI measures 1.29 cm. Pulmonic Valve: The pulmonic valve was normal in structure. Pulmonic valve regurgitation is mild. No evidence of pulmonic stenosis. Aorta: The aortic root and ascending aorta are structurally normal, with no evidence of dilitation and aortic dilatation noted. There is borderline dilatation of the aortic root, measuring 38 mm. Venous: The inferior vena cava is normal in size with greater than 50% respiratory variability, suggesting right atrial pressure of 3 mmHg. IAS/Shunts: The interatrial septum was not well visualized. Additional Comments: 3D was performed not requiring image post processing on an independent workstation and was indeterminate.  LEFT VENTRICLE PLAX 2D LVIDd:         5.20 cm   Diastology LVIDs:         3.47 cm   LV e' medial:    5.33 cm/s LV PW:         1.44 cm   LV E/e' medial:  22.3 LV IVS:        1.47 cm   LV e' lateral:   13.50 cm/s LVOT diam:     2.40 cm   LV E/e' lateral: 8.8 LV SV:         76  LV SV Index:   35        2D Longitudinal Strain  LVOT Area:     4.52 cm  2D Strain GLS Avg:     -13.0 %                           3D Volume EF:                          3D EF:        51 %                          LV EDV:       259 ml                          LV ESV:       127 ml                          LV SV:        132 ml RIGHT VENTRICLE RV S prime:     10.90 cm/s TAPSE (M-mode): 2.2 cm LEFT ATRIUM              Index        RIGHT ATRIUM           Index LA diam:        4.40 cm  2.02 cm/m   RA Area:     24.30 cm LA Vol (A2C):   102.0 ml 46.89 ml/m  RA Volume:   79.10 ml  36.36 ml/m LA Vol (A4C):   123.0 ml 56.55 ml/m LA Biplane Vol: 112.0 ml 51.49 ml/m  AORTIC VALVE AV Area (Vmax):    1.26 cm AV Area (Vmean):   1.25 cm AV Area (VTI):     1.29 cm AV Vmax:           266.00 cm/s AV Vmean:          186.667 cm/s AV VTI:            0.593 m AV Peak Grad:      28.3 mmHg AV Mean Grad:      16.0 mmHg LVOT Vmax:         73.90 cm/s LVOT Vmean:        51.500 cm/s LVOT VTI:          0.169 m LVOT/AV VTI ratio: 0.29  AORTA Ao Sinus diam: 3.79 cm Ao Asc diam:   3.40 cm MITRAL VALVE                TRICUSPID VALVE MV Area (PHT): 3.77 cm     TR Peak grad:   35.3 mmHg MV Decel Time: 201 msec     TR Vmax:        297.00 cm/s MV E velocity: 119.00 cm/s MV A velocity: 49.30 cm/s   SHUNTS MV E/A ratio:  2.41         Systemic VTI:  0.17 m                             Systemic Diam: 2.40 cm Caron Poser Electronically signed by Caron Poser Signature Date/Time: 04/24/2024/3:03:00 PM    Final      Assessment and plan- Patient is a 78 y.o. male here for routine follow-up of iron deficiency anemia and discuss  results of blood work  Assessment and Plan    Iron deficiency anemia Chronic iron deficiency anemia with recent hemoglobin improvement. Iron studies show low ferritin. Etiology unclear; GI blood loss not excluded. B12, thyroid , renal function normal. No multiple myeloma. Deferred GI evaluation, opted for iron  infusions. - Discussed risks and benefits of IV iron including all but not limited to possible risk of infusion anaphylactic reaction.  Patient understands and agrees to proceed as planned.  We will plan to give him 5 doses of Venofer. - Planned repeat labs in three months to reassess hemoglobin and iron indices. - Instructed to monitor for GI bleeding signs and report if he occurs. - Discussed GI referral; deferred, to reconsider if anemia recurs or persists.    -Labs in 3 and 6 months and I will see him back in 6 months   Visit Diagnosis 1. Other iron deficiency anemia      Dr. Annah Skene, MD, MPH [facility] [phone] 05/16/2024 12:46 PM                   [1]  Allergies Allergen Reactions   Biaxin [Clarithromycin] Nausea Only  [2]  Current Outpatient Medications:    amiodarone  (PACERONE ) 200 MG tablet, Take 1 tablet (200 mg total) by mouth daily., Disp: 90 tablet, Rfl: 0   amLODipine  (NORVASC ) 2.5 MG tablet, Take by mouth daily., Disp: , Rfl:    apixaban  (ELIQUIS ) 5 MG TABS tablet, Take 1 tablet (5 mg total) by mouth 2 (two) times daily., Disp: 180 tablet, Rfl: 3   atorvastatin  (LIPITOR ) 20 MG tablet, Take 1 tablet (20 mg total) by mouth at bedtime., Disp: 90 tablet, Rfl: 2   isosorbide  mononitrate (IMDUR ) 30 MG 24 hr tablet, Take 0.5 tablets (15 mg total) by mouth daily., Disp: 30 tablet, Rfl: 5   losartan  (COZAAR ) 100 MG tablet, Take 100 mg by mouth daily., Disp: , Rfl:    memantine  (NAMENDA ) 5 MG tablet, Take 5 mg by mouth 2 (two) times daily., Disp: , Rfl:    omeprazole (PRILOSEC) 40 MG capsule, Take 40 mg by mouth daily before breakfast. , Disp: , Rfl:    sertraline  (ZOLOFT ) 50 MG tablet, Take 50 mg by mouth daily., Disp: , Rfl:    furosemide  (LASIX ) 40 MG tablet, Take 0.5 tablets (20 mg total) by mouth 3 (three) times a week. (Patient not taking: Reported on 04/18/2024), Disp: , Rfl:    glipiZIDE (GLUCOTROL) 5 MG tablet, TAKE 1  TABLET BY MOUTH IN THE MORNING BEFORE BREAKFAST (Patient not taking: Reported on 05/13/2024), Disp: , Rfl:    Glucose Blood (BLOOD GLUCOSE TEST STRIPS) STRP, 1 strip by In Vitro route 2 (two) times daily. (Patient not taking: Reported on 04/18/2024), Disp: 100 each, Rfl: 0   HYDROcodone -acetaminophen  (NORCO/VICODIN) 5-325 MG tablet, Take 1 tablet by mouth as needed. (Patient not taking: Reported on 04/18/2024), Disp: , Rfl:    metoprolol  succinate (TOPROL -XL) 100 MG 24 hr tablet, Take 1 tablet (100 mg total) by mouth daily. Take with or immediately following a meal., Disp: 90 tablet, Rfl: 3

## 2024-05-19 ENCOUNTER — Inpatient Hospital Stay

## 2024-05-19 VITALS — BP 153/77 | HR 64 | Temp 96.2°F | Resp 18

## 2024-05-19 DIAGNOSIS — D508 Other iron deficiency anemias: Secondary | ICD-10-CM

## 2024-05-19 DIAGNOSIS — D509 Iron deficiency anemia, unspecified: Secondary | ICD-10-CM | POA: Diagnosis not present

## 2024-05-19 MED ORDER — IRON SUCROSE 20 MG/ML IV SOLN
200.0000 mg | INTRAVENOUS | Status: DC
  Administered 2024-05-19: 13:00:00 200 mg via INTRAVENOUS
  Filled 2024-05-19: qty 10

## 2024-05-19 MED ORDER — SODIUM CHLORIDE 0.9% FLUSH
10.0000 mL | Freq: Once | INTRAVENOUS | Status: AC | PRN
  Administered 2024-05-19: 13:00:00 10 mL
  Filled 2024-05-19: qty 10

## 2024-05-22 ENCOUNTER — Inpatient Hospital Stay

## 2024-05-22 VITALS — BP 116/71 | HR 69 | Temp 97.8°F

## 2024-05-22 DIAGNOSIS — D508 Other iron deficiency anemias: Secondary | ICD-10-CM

## 2024-05-22 DIAGNOSIS — D509 Iron deficiency anemia, unspecified: Secondary | ICD-10-CM | POA: Diagnosis not present

## 2024-05-22 MED ORDER — IRON SUCROSE 20 MG/ML IV SOLN
200.0000 mg | INTRAVENOUS | Status: DC
  Administered 2024-05-22: 15:00:00 200 mg via INTRAVENOUS
  Filled 2024-05-22: qty 10

## 2024-05-22 NOTE — Patient Instructions (Signed)

## 2024-05-26 ENCOUNTER — Inpatient Hospital Stay

## 2024-05-26 VITALS — BP 98/60 | HR 72 | Temp 97.5°F | Resp 18

## 2024-05-26 DIAGNOSIS — D508 Other iron deficiency anemias: Secondary | ICD-10-CM

## 2024-05-26 DIAGNOSIS — D509 Iron deficiency anemia, unspecified: Secondary | ICD-10-CM | POA: Diagnosis not present

## 2024-05-26 MED ORDER — IRON SUCROSE 20 MG/ML IV SOLN
200.0000 mg | INTRAVENOUS | Status: DC
  Administered 2024-05-26: 200 mg via INTRAVENOUS

## 2024-05-26 NOTE — Patient Instructions (Signed)

## 2024-06-02 ENCOUNTER — Inpatient Hospital Stay

## 2024-06-09 ENCOUNTER — Inpatient Hospital Stay: Attending: Oncology

## 2024-06-09 ENCOUNTER — Telehealth: Payer: Self-pay | Admitting: Oncology

## 2024-06-09 VITALS — BP 144/86 | HR 70 | Temp 97.0°F | Resp 17

## 2024-06-09 DIAGNOSIS — D508 Other iron deficiency anemias: Secondary | ICD-10-CM

## 2024-06-09 MED ORDER — IRON SUCROSE 20 MG/ML IV SOLN
200.0000 mg | INTRAVENOUS | Status: DC
  Administered 2024-06-09: 200 mg via INTRAVENOUS
  Filled 2024-06-09: qty 10

## 2024-06-09 MED ORDER — SODIUM CHLORIDE 0.9% FLUSH
10.0000 mL | Freq: Once | INTRAVENOUS | Status: AC | PRN
  Administered 2024-06-09: 10 mL
  Filled 2024-06-09: qty 10

## 2024-06-09 NOTE — Telephone Encounter (Signed)
 Vm from [place] to r/s pts appt on 1/12. I called her back and new date/time confirmed. I also confirmed todays appt and she stated pt will be here.

## 2024-06-09 NOTE — Patient Instructions (Signed)

## 2024-06-16 ENCOUNTER — Inpatient Hospital Stay

## 2024-06-23 ENCOUNTER — Inpatient Hospital Stay

## 2024-06-23 VITALS — BP 122/70 | HR 70 | Temp 97.0°F | Resp 18

## 2024-06-23 DIAGNOSIS — D508 Other iron deficiency anemias: Secondary | ICD-10-CM

## 2024-06-23 MED ORDER — SODIUM CHLORIDE 0.9% FLUSH
10.0000 mL | Freq: Once | INTRAVENOUS | Status: AC | PRN
  Administered 2024-06-23: 10 mL
  Filled 2024-06-23: qty 10

## 2024-06-23 MED ORDER — IRON SUCROSE 20 MG/ML IV SOLN
200.0000 mg | INTRAVENOUS | Status: DC
  Administered 2024-06-23: 200 mg via INTRAVENOUS

## 2024-06-23 NOTE — Progress Notes (Signed)
 Patient tolerated Venofer  infusion well. Explained recommendation of 30 min post monitoring. Patient refused to wait post monitoring. Educated on what signs to watch for & to call with any concerns. No questions, discharged. Stable

## 2024-06-23 NOTE — Patient Instructions (Signed)

## 2024-06-26 ENCOUNTER — Inpatient Hospital Stay

## 2024-08-21 ENCOUNTER — Inpatient Hospital Stay

## 2024-08-28 ENCOUNTER — Ambulatory Visit: Admitting: Physician Assistant

## 2024-11-17 ENCOUNTER — Inpatient Hospital Stay

## 2024-11-17 ENCOUNTER — Inpatient Hospital Stay: Admitting: Oncology
